# Patient Record
Sex: Female | Born: 1990 | Race: Black or African American | Hispanic: No | Marital: Single | Smoking: Never smoker
Health system: Southern US, Community
[De-identification: ages and names within clinical notes are randomized; demographics above are authoritative.]

## PROBLEM LIST (undated history)

## (undated) ENCOUNTER — Inpatient Hospital Stay (HOSPITAL_COMMUNITY): Payer: Self-pay

## (undated) DIAGNOSIS — B999 Unspecified infectious disease: Secondary | ICD-10-CM

## (undated) DIAGNOSIS — A6000 Herpesviral infection of urogenital system, unspecified: Secondary | ICD-10-CM

## (undated) DIAGNOSIS — O09219 Supervision of pregnancy with history of pre-term labor, unspecified trimester: Secondary | ICD-10-CM

## (undated) DIAGNOSIS — Z789 Other specified health status: Secondary | ICD-10-CM

## (undated) DIAGNOSIS — R519 Headache, unspecified: Secondary | ICD-10-CM

## (undated) DIAGNOSIS — R51 Headache: Secondary | ICD-10-CM

## (undated) DIAGNOSIS — F3181 Bipolar II disorder: Secondary | ICD-10-CM

## (undated) HISTORY — PX: INDUCED ABORTION: SHX677

## (undated) HISTORY — DX: Supervision of pregnancy with history of pre-term labor, unspecified trimester: O09.219

## (undated) HISTORY — PX: THERAPEUTIC ABORTION: SHX798

---
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
## 2012-05-05 ENCOUNTER — Emergency Department (HOSPITAL_COMMUNITY)
Admission: EM | Admit: 2012-05-05 | Discharge: 2012-05-05 | Disposition: A | Discharge status: Home / Self Care | Payer: Self-pay | Attending: Emergency Medicine | Admitting: Emergency Medicine

## 2012-05-05 ENCOUNTER — Encounter (HOSPITAL_COMMUNITY): Payer: Self-pay | Admitting: *Deleted

## 2012-05-05 DIAGNOSIS — T304 Corrosion of unspecified body region, unspecified degree: Secondary | ICD-10-CM

## 2012-05-05 DIAGNOSIS — N898 Other specified noninflammatory disorders of vagina: Secondary | ICD-10-CM | POA: Insufficient documentation

## 2012-05-05 DIAGNOSIS — F17200 Nicotine dependence, unspecified, uncomplicated: Secondary | ICD-10-CM | POA: Insufficient documentation

## 2012-05-05 DIAGNOSIS — Y9389 Activity, other specified: Secondary | ICD-10-CM | POA: Insufficient documentation

## 2012-05-05 DIAGNOSIS — T5491XA Toxic effect of unspecified corrosive substance, accidental (unintentional), initial encounter: Secondary | ICD-10-CM | POA: Insufficient documentation

## 2012-05-05 DIAGNOSIS — Z3202 Encounter for pregnancy test, result negative: Secondary | ICD-10-CM | POA: Insufficient documentation

## 2012-05-05 DIAGNOSIS — Y929 Unspecified place or not applicable: Secondary | ICD-10-CM | POA: Insufficient documentation

## 2012-05-05 LAB — URINE MICROSCOPIC-ADD ON

## 2012-05-05 LAB — URINALYSIS, ROUTINE W REFLEX MICROSCOPIC
Bilirubin Urine: NEGATIVE
Glucose, UA: NEGATIVE mg/dL
Ketones, ur: NEGATIVE mg/dL
Nitrite: NEGATIVE
Specific Gravity, Urine: 1.023 (ref 1.005–1.030)
pH: 5.5 (ref 5.0–8.0)

## 2012-05-05 LAB — WET PREP, GENITAL
Clue Cells Wet Prep HPF POC: NONE SEEN
Trich, Wet Prep: NONE SEEN

## 2012-05-05 NOTE — ED Provider Notes (Signed)
History   This chart was scribed for non-physician practitioner working with Flint Melter, MD by Gerlean Ren, ED Scribe. This patient was seen in room TR06C/TR06C and the patient's care was started at 6:31 PM.     CSN: [id number]  Arrival date & time 05/05/12  1712   First MD Initiated Contact with Patient 05/05/12 1808      Chief Complaint  Patient presents with  . Lip dryness      The history is provided by the patient. No language interpreter was used.   Tara Sanchez is a 22 y.o. female with no chronic medical conditions who presents to the Emergency Department complaining of constant lip dryness since applying bleach to her lips after cleaning several days ago. She states her lips feel sore but not excessively swollen and denies any bleeding. Pt denies any swelling inside her mouth.   Pt further c/o one week of vaginal discharge and requests testing for STDs.  Pt is sexually active but does not use birth control.  LNMP "last month."  Pt denies dysuria, vaginal bleeding, abdominal pain, nausea, emesis, fever.   Pt is a current someday smoker and denies alcohol use. History reviewed. No pertinent past medical history.  History reviewed. No pertinent past surgical history.  No family history on file.  History  Substance Use Topics  . Smoking status: Current Some Day Smoker    Types: Cigarettes  . Smokeless tobacco: Not on file  . Alcohol Use: No    No OB history provided.   Review of Systems  Constitutional: Negative for fever.  HENT:       Dry lips  Gastrointestinal: Negative for nausea, vomiting and abdominal pain.  Genitourinary: Positive for vaginal discharge. Negative for dysuria, vaginal bleeding and vaginal pain.    Allergies  Review of patient's allergies indicates no known allergies.  Home Medications  No current outpatient prescriptions on file.  BP 123/67  Pulse 83  Temp 98.3 F (36.8 C) (Oral)  Resp 18  SpO2 100%  LMP 04/15/2012  Physical  Exam  Nursing note and vitals reviewed. Constitutional: She is oriented to person, place, and time. She appears well-developed and well-nourished. No distress.  HENT:  Head: Normocephalic and atraumatic.       Upper and lower lips red without blisters, burns or bleeding. Minimal swelling.   Eyes: EOM are normal.  Neck: Neck supple. No tracheal deviation present.  Pulmonary/Chest: Effort normal. No respiratory distress.  Abdominal: Soft. There is no tenderness.  Genitourinary:       Cervix and adnexa non-tender, cervix slightly red, friable, mild white cervical discharge.  Musculoskeletal: Normal range of motion.  Neurological: She is alert and oriented to person, place, and time.  Skin: Skin is warm and dry.  Psychiatric: She has a normal mood and affect. Her behavior is normal.    ED Course  Procedures (including critical care time) DIAGNOSTIC STUDIES: Oxygen Saturation is 100% on room air, normal by my interpretation.    COORDINATION OF CARE: 6:37 PM- Pelvic exam performed.  Patient informed of clinical course, understands medical decision-making process, and agrees with plan.     Labs Reviewed  POCT PREGNANCY, URINE  URINALYSIS, ROUTINE W REFLEX MICROSCOPIC   Results for orders placed during the hospital encounter of 05/05/12  URINALYSIS, ROUTINE W REFLEX MICROSCOPIC      Component Value Range   Color, Urine YELLOW  YELLOW   APPearance CLOUDY (*) CLEAR   Specific Gravity, Urine 1.023  1.005 -  1.030   pH 5.5  5.0 - 8.0   Glucose, UA NEGATIVE  NEGATIVE mg/dL   Hgb urine dipstick NEGATIVE  NEGATIVE   Bilirubin Urine NEGATIVE  NEGATIVE   Ketones, ur NEGATIVE  NEGATIVE mg/dL   Protein, ur NEGATIVE  NEGATIVE mg/dL   Urobilinogen, UA 0.2  0.0 - 1.0 mg/dL   Nitrite NEGATIVE  NEGATIVE   Leukocytes, UA TRACE (*) NEGATIVE  POCT PREGNANCY, URINE      Component Value Range   Preg Test, Ur NEGATIVE  NEGATIVE  URINE MICROSCOPIC-ADD ON      Component Value Range   Squamous  Epithelial / LPF MANY (*) RARE   WBC, UA 3-6  <3 WBC/hpf   RBC / HPF 0-2  <3 RBC/hpf   Bacteria, UA MANY (*) RARE  WET PREP, GENITAL      Component Value Range   Yeast Wet Prep HPF POC NONE SEEN  NONE SEEN   Trich, Wet Prep NONE SEEN  NONE SEEN   Clue Cells Wet Prep HPF POC NONE SEEN  NONE SEEN   WBC, Wet Prep HPF POC FEW (*) NONE SEEN    No results found.   No diagnosis found. 1. Chemical burn, lip 2. Vaginal discharge    MDM  Mild chemical irritation to lips without further complication. Vaginal discharge without any cervical or pelvic tenderness - doubt PID, TOA. No dysuria or frequency to cause concern for UTI. Will await cultures (GC, chlamydia, urine).  I personally performed the services described in this documentation, which was scribed in my presence. The recorded information has been reviewed and is accurate.        Arnoldo Hooker, PA-C 05/05/12 1921

## 2012-05-05 NOTE — ED Notes (Signed)
Pt is here with lip dryness and states that she may have touched her lips with carmax and now reporting dry lips.  Pt here to be checked for stds and pregnancy but no symptoms

## 2012-05-05 NOTE — ED Provider Notes (Signed)
Medical screening examination/treatment/procedure(s) were performed by non-physician practitioner and as supervising physician I was immediately available for consultation/collaboration.   Flint Melter, MD 05/05/12 [phone]

## 2012-05-05 NOTE — ED Notes (Signed)
Pt given discharge paperwork; pt had no additional questions about discharge; VSS; e-signature obtained;

## 2012-05-06 LAB — URINE CULTURE: Culture: NO GROWTH

## 2012-05-06 LAB — GC/CHLAMYDIA PROBE AMP: GC Probe RNA: NEGATIVE

## 2012-08-17 ENCOUNTER — Emergency Department (HOSPITAL_COMMUNITY)
Admission: EM | Admit: 2012-08-17 | Discharge: 2012-08-17 | Disposition: A | Discharge status: Home / Self Care | Payer: Medicaid Other | Attending: Emergency Medicine | Admitting: Emergency Medicine

## 2012-08-17 ENCOUNTER — Encounter (HOSPITAL_COMMUNITY): Payer: Self-pay | Admitting: Emergency Medicine

## 2012-08-17 DIAGNOSIS — H5710 Ocular pain, unspecified eye: Secondary | ICD-10-CM | POA: Insufficient documentation

## 2012-08-17 DIAGNOSIS — H5789 Other specified disorders of eye and adnexa: Secondary | ICD-10-CM | POA: Insufficient documentation

## 2012-08-17 DIAGNOSIS — H109 Unspecified conjunctivitis: Secondary | ICD-10-CM

## 2012-08-17 DIAGNOSIS — F17200 Nicotine dependence, unspecified, uncomplicated: Secondary | ICD-10-CM | POA: Insufficient documentation

## 2012-08-17 MED ORDER — TOBRAMYCIN 0.3 % OP SOLN
2.0000 [drp] | Freq: Four times a day (QID) | OPHTHALMIC | Status: DC
  Administered 2012-08-17: 2 [drp] via OPHTHALMIC
  Filled 2012-08-17: qty 5

## 2012-08-17 NOTE — ED Notes (Signed)
PT. REPORTS RED , ITCHY EYES WITH DRAINAGE FOR 4 DAYS , DENIES INJURY , NO BLURRED VISION, TRIED OTC EYE GTTS WITH NO RELIEF.

## 2012-08-17 NOTE — ED Provider Notes (Signed)
Medical screening examination/treatment/procedure(s) were performed by non-physician practitioner and as supervising physician I was immediately available for consultation/collaboration.   Roselee Tayloe, MD 08/17/12 2312 

## 2012-08-17 NOTE — ED Provider Notes (Signed)
History     CSN: [id number]  Arrival date & time 08/17/12  2138   First MD Initiated Contact with Patient 08/17/12 2220      Chief Complaint  Patient presents with  . Conjunctivitis    (Consider location/radiation/quality/duration/timing/severity/associated sxs/prior treatment) Patient is a 22 y.o. female presenting with conjunctivitis. The history is provided by the patient.  Conjunctivitis  The current episode started today. Associated symptoms include eye discharge, eye pain and eye redness. Pertinent negatives include no fever, no photophobia and no rhinorrhea.    History reviewed. No pertinent past medical history.  History reviewed. No pertinent past surgical history.  No family history on file.  History  Substance Use Topics  . Smoking status: Current Some Day Smoker    Types: Cigarettes  . Smokeless tobacco: Not on file  . Alcohol Use: No    OB History   Grav Para Term Preterm Abortions TAB SAB Ect Mult Living                  Review of Systems  Constitutional: Negative for fever.  HENT: Negative for rhinorrhea.   Eyes: Positive for pain, discharge and redness. Negative for photophobia.    Allergies  Review of patient's allergies indicates no known allergies.  Home Medications   Current Outpatient Rx  Name  Route  Sig  Dispense  Refill  . tetrahydrozoline 0.05 % ophthalmic solution   Both Eyes   Place 2 drops into both eyes 3 (three) times daily.           BP 119/71  Pulse 84  Temp(Src) 98.2 F (36.8 C) (Oral)  Resp 16  SpO2 99%  LMP 08/09/2012  Physical Exam  HENT:  Head: Normocephalic.  Eyes: EOM and lids are normal. Pupils are equal, round, and reactive to light. Right conjunctiva is injected. Left conjunctiva is injected.    ED Course  Procedures (including critical care time)  Labs Reviewed - No data to display No results found.   No diagnosis found. 1. Conjunctivitis    MDM  Uncomplicated  conjunctivitis        Arnoldo Hooker, PA-C 08/17/12 2257

## 2012-08-17 NOTE — ED Notes (Signed)
Patient given discharge instructions for conjunctivitis. Advised to follow up with primary care or eye doctor as needed and to continue to use eye drops provided. Patient voiced understanding of all instructions and had no further questions. Patient ambulated to front lobby.

## 2013-08-05 ENCOUNTER — Encounter: Payer: Self-pay | Admitting: Medical

## 2014-03-10 ENCOUNTER — Emergency Department (HOSPITAL_COMMUNITY)
Admission: EM | Admit: 2014-03-10 | Discharge: 2014-03-10 | Disposition: A | Discharge status: Home / Self Care | Payer: Managed Care, Other (non HMO) | Source: Home / Self Care | Attending: Family Medicine | Admitting: Family Medicine

## 2014-03-10 ENCOUNTER — Encounter (HOSPITAL_COMMUNITY): Payer: Self-pay | Admitting: Emergency Medicine

## 2014-03-10 DIAGNOSIS — N390 Urinary tract infection, site not specified: Secondary | ICD-10-CM

## 2014-03-10 LAB — POCT URINALYSIS DIP (DEVICE)
BILIRUBIN URINE: NEGATIVE
GLUCOSE, UA: NEGATIVE mg/dL
KETONES UR: NEGATIVE mg/dL
Nitrite: NEGATIVE
PH: 6 (ref 5.0–8.0)
Protein, ur: NEGATIVE mg/dL
Specific Gravity, Urine: >=1.03 (ref 1.005–1.030)
Urobilinogen, UA: 1 mg/dL (ref 0.0–1.0)

## 2014-03-10 LAB — POCT PREGNANCY, URINE: Preg Test, Ur: NEGATIVE

## 2014-03-10 MED ORDER — PHENAZOPYRIDINE HCL 200 MG PO TABS: 200.0000 mg | ORAL_TABLET | Freq: Three times a day (TID) | ORAL | Status: DC

## 2014-03-10 MED ORDER — CEFUROXIME AXETIL 250 MG PO TABS: 250.0000 mg | ORAL_TABLET | Freq: Two times a day (BID) | ORAL | Status: DC

## 2014-03-10 NOTE — Discharge Instructions (Signed)

## 2014-03-10 NOTE — ED Notes (Signed)
Pt noticed she was having symptoms last night.

## 2014-03-10 NOTE — ED Provider Notes (Signed)
CSN: [id number]     Arrival date & time 03/10/14  1512 History   First MD Initiated Contact with Patient 03/10/14 1531     Chief Complaint  Patient presents with  . Urinary Tract Infection   (Consider location/radiation/quality/duration/timing/severity/associated sxs/prior Treatment) HPI            23 year old female presents for evaluation of a UTI. She has a history of recurrent UTI, she gets one every 3-4 months. She has urinary frequency and urgency, and slight dysuria. She denies abdominal pain, NVD, flank pain. No vaginal discharge. No risk of STDs. No recent travel or sick contacts. No recent hospitalizations or catheterizations.  No past medical history on file. No past surgical history on file. No family history on file. History  Substance Use Topics  . Smoking status: Current Some Day Smoker    Types: Cigarettes  . Smokeless tobacco: Not on file  . Alcohol Use: No   OB History    No data available     Review of Systems  Constitutional: Negative for fever.  Gastrointestinal: Negative for nausea, vomiting and abdominal pain.  Genitourinary: Positive for dysuria, urgency and frequency. Negative for hematuria, flank pain and vaginal discharge.  All other systems reviewed and are negative.   Allergies  Review of patient's allergies indicates no known allergies.  Home Medications   Prior to Admission medications   Medication Sig Start Date End Date Taking? Authorizing Provider  cefUROXime (CEFTIN) 250 MG tablet Take 1 tablet (250 mg total) by mouth 2 (two) times daily with a meal. 03/10/14   Graylon GoodZachary H Adi Seales, PA-C  phenazopyridine (PYRIDIUM) 200 MG tablet Take 1 tablet (200 mg total) by mouth 3 (three) times daily. 03/10/14   Graylon GoodZachary H Genesys Coggeshall, PA-C  tetrahydrozoline 0.05 % ophthalmic solution Place 2 drops into both eyes 3 (three) times daily.    Historical Provider, MD   BP 126/67 mmHg  Pulse 86  Temp(Src) 98.2 F (36.8 C) (Oral)  Resp 18  SpO2 96% Physical Exam   Constitutional: She is oriented to person, place, and time. Vital signs are normal. She appears well-developed and well-nourished. No distress.  HENT:  Head: Normocephalic and atraumatic.  Pulmonary/Chest: Effort normal. No respiratory distress.  Abdominal: Soft. Normal appearance. She exhibits no distension and no mass. There is no tenderness. There is no rebound, no guarding and no CVA tenderness.  Neurological: She is alert and oriented to person, place, and time. She has normal strength. Coordination normal.  Skin: Skin is warm and dry. No rash noted. She is not diaphoretic.  Psychiatric: She has a normal mood and affect. Judgment normal.  Nursing note and vitals reviewed.   ED Course  Procedures (including critical care time) Labs Review Labs Reviewed  POCT URINALYSIS DIP (DEVICE) - Abnormal; Notable for the following:    Hgb urine dipstick TRACE (*)    Leukocytes, UA SMALL (*)    All other components within normal limits  URINE CULTURE  POCT PREGNANCY, URINE    Imaging Review No results found.   MDM   1. UTI (lower urinary tract infection)    Simple UTI, treat with Ceftin. Urine culture sent. Follow-up when necessary  Meds ordered this encounter  Medications  . cefUROXime (CEFTIN) 250 MG tablet    Sig: Take 1 tablet (250 mg total) by mouth 2 (two) times daily with a meal.    Dispense:  10 tablet    Refill:  0    Order Specific Question:  Supervising Provider  Answer:  Linna HoffKINDL, JAMES D [[id number]]  . phenazopyridine (PYRIDIUM) 200 MG tablet    Sig: Take 1 tablet (200 mg total) by mouth 3 (three) times daily.    Dispense:  6 tablet    Refill:  0    Order Specific Question:  Supervising Provider    Answer:  Bradd CanaryKINDL, JAMES D [[id number]]      Graylon GoodZachary H Rayden Dock, PA-C 03/10/14 [phone]

## 2014-03-12 LAB — URINE CULTURE: Colony Count: >=100000

## 2014-03-12 NOTE — ED Notes (Signed)
Urine culture: >100,000 colonies Staph species (coagulase neg.).  Pt adequately treated with Ceftin. [name] M 03/12/2014  

## 2014-03-27 ENCOUNTER — Emergency Department (HOSPITAL_COMMUNITY)
Admission: EM | Admit: 2014-03-27 | Discharge: 2014-03-27 | Disposition: A | Discharge status: Home / Self Care | Payer: Managed Care, Other (non HMO) | Attending: Emergency Medicine | Admitting: Emergency Medicine

## 2014-03-27 ENCOUNTER — Encounter (HOSPITAL_COMMUNITY): Payer: Self-pay | Admitting: *Deleted

## 2014-03-27 DIAGNOSIS — Z87440 Personal history of urinary (tract) infections: Secondary | ICD-10-CM | POA: Insufficient documentation

## 2014-03-27 DIAGNOSIS — N3090 Cystitis, unspecified without hematuria: Secondary | ICD-10-CM | POA: Insufficient documentation

## 2014-03-27 DIAGNOSIS — Z3202 Encounter for pregnancy test, result negative: Secondary | ICD-10-CM | POA: Insufficient documentation

## 2014-03-27 DIAGNOSIS — N939 Abnormal uterine and vaginal bleeding, unspecified: Secondary | ICD-10-CM | POA: Insufficient documentation

## 2014-03-27 DIAGNOSIS — R110 Nausea: Secondary | ICD-10-CM | POA: Insufficient documentation

## 2014-03-27 DIAGNOSIS — Z79899 Other long term (current) drug therapy: Secondary | ICD-10-CM | POA: Insufficient documentation

## 2014-03-27 DIAGNOSIS — Z792 Long term (current) use of antibiotics: Secondary | ICD-10-CM | POA: Insufficient documentation

## 2014-03-27 DIAGNOSIS — Z720 Tobacco use: Secondary | ICD-10-CM | POA: Insufficient documentation

## 2014-03-27 LAB — WET PREP, GENITAL
Clue Cells Wet Prep HPF POC: NONE SEEN
Trich, Wet Prep: NONE SEEN
Yeast Wet Prep HPF POC: NONE SEEN

## 2014-03-27 LAB — URINALYSIS, ROUTINE W REFLEX MICROSCOPIC
BILIRUBIN URINE: NEGATIVE
GLUCOSE, UA: NEGATIVE mg/dL
Ketones, ur: NEGATIVE mg/dL
Nitrite: NEGATIVE
PH: 6 (ref 5.0–8.0)
Protein, ur: 100 mg/dL — AB
SPECIFIC GRAVITY, URINE: 1.023 (ref 1.005–1.030)
Urobilinogen, UA: 1 mg/dL (ref 0.0–1.0)

## 2014-03-27 LAB — POC URINE PREG, ED: Preg Test, Ur: NEGATIVE

## 2014-03-27 LAB — URINE MICROSCOPIC-ADD ON

## 2014-03-27 MED ORDER — NITROFURANTOIN MONOHYD MACRO 100 MG PO CAPS
100.0000 mg | ORAL_CAPSULE | Freq: Once | ORAL | Status: AC
  Administered 2014-03-27: 100 mg via ORAL
  Filled 2014-03-27: qty 1

## 2014-03-27 MED ORDER — NITROFURANTOIN MONOHYD MACRO 100 MG PO CAPS: 100.0000 mg | ORAL_CAPSULE | Freq: Two times a day (BID) | ORAL | Status: DC

## 2014-03-27 NOTE — ED Notes (Signed)
The pt is sleeping 

## 2014-03-27 NOTE — ED Provider Notes (Signed)
CSN: [id number]     Arrival date & time 03/27/14  0007 History  This chart was scribed for Mirian MoMatthew Gentry, MD by Evon Slackerrance Branch, ED Scribe. This patient was seen in room D33C/D33C and the patient's care was started at 1:07 AM.    Chief Complaint  Patient presents with  . Abdominal Pain   Patient is a 23 y.o. female presenting with abdominal pain. The history is provided by the patient. No language interpreter was used.  Abdominal Pain Pain location:  Suprapubic Pain quality: sharp   Pain radiates to:  Does not radiate Pain severity:  Mild Onset quality:  Gradual Timing:  Intermittent Progression:  Worsening Relieved by:  Urination Worsened by:  Nothing tried Associated symptoms: nausea and vaginal bleeding   Associated symptoms: no chest pain, no cough, no dysuria, no fever, no shortness of breath, no vaginal discharge and no vomiting    HPI Comments: Tara Sanchez is a 23 y.o. female who presents to the Emergency Department complaining of intermittent  sharp lower abdominal pain onset 3 days ago.. She states that the pain has worsened and bacame constant in the past 2 hours. She states she has had some associated vaginal bleeding and nausea. She states that urinating sometimes provides some relief. Pt states she has a hx of UTI's. She states that he LNMP was 1 week ago. Denies dysuria, vaginal discharge, fever, vomiting, diarrhea, chest pain or SOB.    History reviewed. No pertinent past medical history. History reviewed. No pertinent past surgical history. History reviewed. No pertinent family history. History  Substance Use Topics  . Smoking status: Current Some Day Smoker    Types: Cigarettes  . Smokeless tobacco: Not on file  . Alcohol Use: No   OB History    No data available     Review of Systems  Constitutional: Negative for fever.  Respiratory: Negative for cough and shortness of breath.   Cardiovascular: Negative for chest pain.  Gastrointestinal: Positive for  nausea and abdominal pain. Negative for vomiting.  Genitourinary: Positive for vaginal bleeding. Negative for dysuria and vaginal discharge.  All other systems reviewed and are negative.   Allergies  Review of patient's allergies indicates no known allergies.  Home Medications   Prior to Admission medications   Medication Sig Start Date End Date Taking? Authorizing Provider  cefUROXime (CEFTIN) 250 MG tablet Take 1 tablet (250 mg total) by mouth 2 (two) times daily with a meal. Patient not taking: Reported on 03/27/2014 03/10/14   Graylon GoodZachary H Baker, PA-C  nitrofurantoin, macrocrystal-monohydrate, (MACROBID) 100 MG capsule Take 1 capsule (100 mg total) by mouth 2 (two) times daily. X 7 days 03/27/14   Mirian MoMatthew Gentry, MD  phenazopyridine (PYRIDIUM) 200 MG tablet Take 1 tablet (200 mg total) by mouth 3 (three) times daily. Patient not taking: Reported on 03/27/2014 03/10/14   Graylon GoodZachary H Baker, PA-C   Triage Vitals: BP 118/57 mmHg  Pulse 86  Temp(Src) 98.5 F (36.9 C) (Oral)  Resp 20  SpO2 100%  LMP 03/19/2014  Physical Exam  Constitutional: She is oriented to person, place, and time. She appears well-developed and well-nourished.  HENT:  Head: Normocephalic and atraumatic.  Right Ear: External ear normal.  Left Ear: External ear normal.  Eyes: Conjunctivae and EOM are normal. Pupils are equal, round, and reactive to light.  Neck: Normal range of motion. Neck supple.  Cardiovascular: Normal rate, regular rhythm, normal heart sounds and intact distal pulses.   Pulmonary/Chest: Effort normal and breath sounds  normal.  Abdominal: Soft. Bowel sounds are normal. There is tenderness in the suprapubic area.  Genitourinary: Cervix exhibits no motion tenderness, no discharge and no friability. Right adnexum displays no tenderness. Left adnexum displays no tenderness.  Musculoskeletal: Normal range of motion.  Neurological: She is alert and oriented to person, place, and time.  Skin: Skin is warm  and dry.  Vitals reviewed.   ED Course  Procedures (including critical care time) Labs Review Labs Reviewed  WET PREP, GENITAL - Abnormal; Notable for the following:    WBC, Wet Prep HPF POC FEW (*)    All other components within normal limits  URINALYSIS, ROUTINE W REFLEX MICROSCOPIC - Abnormal; Notable for the following:    APPearance TURBID (*)    Hgb urine dipstick MODERATE (*)    Protein, ur 100 (*)    Leukocytes, UA LARGE (*)    All other components within normal limits  GC/CHLAMYDIA PROBE AMP  URINE CULTURE  URINE MICROSCOPIC-ADD ON  POC URINE PREG, ED    Imaging Review No results found.   EKG Interpretation None      MDM   Final diagnoses:  Cystitis       23 y.o. female with pertinent PMH of recurrent UTI presents with recurrent UTI symptoms, with exception of dysuria.  Patient states that she typically has pain in same location with prior urinary tract infections.  She denies fever, flank pain, other signs of pyelonephritis. No vaginal discharge or bleeding. On arrival today vitals signs and physical exam as above. No signs of STI on my examination. UA with signs of infection. Treated with Macrobid. We'll have her discharged home and follow-up with PCP.    1. Cystitis           Mirian MoMatthew Gentry, MD 03/27/14 [phone]

## 2014-03-27 NOTE — ED Notes (Addendum)
Pt st's she has had lower abd pain for a few days but became worse tonight.  Describes pain as sharp.  Nausea without vomiting.  Pt denies any vag. Discharge.  Pt talking on cell phone entire assessment being made.

## 2014-03-27 NOTE — Discharge Instructions (Signed)

## 2014-03-27 NOTE — ED Notes (Signed)
Pt in c/o suprapubic abd pain for the last few days, denies other symptoms, no distress noted

## 2014-03-28 LAB — URINE CULTURE

## 2014-03-28 LAB — GC/CHLAMYDIA PROBE AMP
CT Probe RNA: NEGATIVE
GC Probe RNA: NEGATIVE

## 2014-06-21 ENCOUNTER — Ambulatory Visit (INDEPENDENT_AMBULATORY_CARE_PROVIDER_SITE_OTHER): Payer: BLUE CROSS/BLUE SHIELD | Admitting: Physician Assistant

## 2014-06-21 VITALS — BP 110/65 | HR 80 | Temp 98.9°F | Resp 16 | Ht 68.0 in | Wt 171.0 lb

## 2014-06-21 DIAGNOSIS — L298 Other pruritus: Secondary | ICD-10-CM

## 2014-06-21 DIAGNOSIS — N898 Other specified noninflammatory disorders of vagina: Secondary | ICD-10-CM | POA: Diagnosis not present

## 2014-06-21 DIAGNOSIS — A5901 Trichomonal vulvovaginitis: Secondary | ICD-10-CM

## 2014-06-21 DIAGNOSIS — R350 Frequency of micturition: Secondary | ICD-10-CM | POA: Diagnosis not present

## 2014-06-21 DIAGNOSIS — A5909 Other urogenital trichomoniasis: Secondary | ICD-10-CM

## 2014-06-21 DIAGNOSIS — A5903 Trichomonal cystitis and urethritis: Secondary | ICD-10-CM

## 2014-06-21 LAB — POCT UA - MICROSCOPIC ONLY
Casts, Ur, LPF, POC: NEGATIVE
Crystals, Ur, HPF, POC: NEGATIVE
Mucus, UA: NEGATIVE
Trichomonas, UA: POSITIVE
Yeast, UA: NEGATIVE

## 2014-06-21 LAB — POCT URINALYSIS DIPSTICK
Bilirubin, UA: NEGATIVE
Glucose, UA: NEGATIVE
Nitrite, UA: NEGATIVE
Protein, UA: NEGATIVE
RBC UA: NEGATIVE
SPEC GRAV UA: 1.02
UROBILINOGEN UA: 1
pH, UA: 7

## 2014-06-21 LAB — POCT WET PREP WITH KOH
KOH Prep POC: NEGATIVE
TRICHOMONAS UA: POSITIVE
YEAST WET PREP PER HPF POC: NEGATIVE

## 2014-06-21 MED ORDER — METRONIDAZOLE 500 MG PO TABS: 500.0000 mg | ORAL_TABLET | Freq: Two times a day (BID) | ORAL | Status: AC

## 2014-06-21 NOTE — Progress Notes (Signed)
[facility] [address], [place] [address] [phone]  Date:  06/21/2014   Name:  Tara Sanchez   DOB:  1990-04-29   MRN:  [id number]  PCP:  No PCP Per Patient    Chief Complaint: Vaginal Discharge and Urinary Frequency   History of Present Illness:  Tara Sanchez is a 24 y.o. very pleasant female patient who presents with the following: 3 days of a yellowish discharge that appears to be more in volume than her normal.  She has irritation in the groin and also noticed an odor.  She states there is no new onset of rash.  She states that she has also noticed an increase in frequency of urination, though there is no dysuria, hematuria, abdominal pain, fever, nausea, or vomiting.   Patient claims 2 sexual partners, with one new partner within a week of becoming symptomatic.  This was an unprotected sexual partner (which initially stated were protected sexual partners until the labs were positive).     There are no active problems to display for this patient.   No past medical history on file.  No past surgical history on file.  History  Substance Use Topics  . Smoking status: Current Some Day Smoker    Types: Cigarettes  . Smokeless tobacco: Not on file  . Alcohol Use: No    No family history on file.  No Known Allergies  Medication list has been reviewed and updated.  No current outpatient prescriptions on file prior to visit.   No current facility-administered medications on file prior to visit.    Review of Systems: ROS otherwise unremarkable unless listed above.  Physical Examination: Filed Vitals:   06/21/14 1659  BP: 110/65  Pulse: 80  Temp: 98.9 F (37.2 C)  Resp: 16   Filed Vitals:   06/21/14 1659  Height: 5\' 8"  (1.727 m)  Weight: 171 lb (77.565 kg)   Body mass index is 26.01 kg/(m^2). Ideal Body Weight: Weight in (lb) to have BMI = 25: 164.1 Physical Exam  Constitutional: She appears well-developed and well-nourished.   Eyes: Pupils are equal, round, and reactive to light.  Cardiovascular: Normal rate.   Pulmonary/Chest: Effort normal.  Genitourinary: Pelvic exam was performed with patient supine. There is rash (very few comedones along the pubic area and outer labia majora.  ) on the right labia. There is no tenderness on the right labia. There is no tenderness on the left labia. Cervix exhibits no motion tenderness, no discharge and no friability. No erythema or tenderness in the vagina.  Heavy ample yellowish grey discharge with musty odor.    Psychiatric: She has a normal mood and affect. Her behavior is normal.   At the start of visit, patient present her pants to me asking if this discharge is normal, with heavy yellow mucus all along seat of pants.  Her affect is mildly histrionic, yet very pleasant.  Speech is normal without pressure and speed.    Results for orders placed or performed in visit on 06/21/14  POCT UA - Microscopic Only  Result Value Ref Range   WBC, Ur, HPF, POC 25-35    RBC, urine, microscopic 2-4    Bacteria, U Microscopic 1+    Mucus, UA neg    Epithelial cells, urine per micros 3-5    Crystals, Ur, HPF, POC neg    Casts, Ur, LPF, POC neg    Yeast, UA neg    Trichomonas, UA Positive  POCT urinalysis dipstick  Result Value Ref Range   Color, UA yellow    Clarity, UA cloudy    Glucose, UA neg    Bilirubin, UA neg    Ketones, UA trace    Spec Grav, UA 1.020    Blood, UA neg    pH, UA 7.0    Protein, UA neg    Urobilinogen, UA 1.0    Nitrite, UA neg    Leukocytes, UA large (3+)   POCT Wet Prep with KOH  Result Value Ref Range   Trichomonas, UA Positive    Clue Cells Wet Prep HPF POC 1-3    Epithelial Wet Prep HPF POC 2-4    Yeast Wet Prep HPF POC neg    Bacteria Wet Prep HPF POC 1+    RBC Wet Prep HPF POC 1-4    WBC Wet Prep HPF POC 35-40    KOH Prep POC Negative       Assessment and Plan: 24 year old female is here today for chief complaint of urinary  frequency and vaginal discharge and itching.  Spent ample time discussing safer sexual practice and condom use.   Treating with metronidazole BID for 7 days. Advised to let sexual partners know.    Urinary frequency - Plan: POCT UA - Microscopic Only, POCT urinalysis dipstick, Urine culture  Vaginal itching - Plan: POCT UA - Microscopic Only, POCT urinalysis dipstick, POCT Wet Prep with KOH, GC/Chlamydia Probe Amp, Urine culture  Vaginal discharge - Plan: GC/Chlamydia Probe Amp, Urine culture  Trichomoniasis of bladder - Plan: metroNIDAZOLE (FLAGYL) 500 MG tablet  Trichomoniasis of vagina - Plan: metroNIDAZOLE (FLAGYL) 500 MG tablet  Trena Platt, PA-C [facility] 3/2/20165:50 PM   .

## 2014-06-21 NOTE — Patient Instructions (Addendum)
Trichomoniasis °Trichomoniasis is an infection caused by an organism called Trichomonas. The infection can affect both women and men. In women, the outer female genitalia and the vagina are affected. In men, the penis is mainly affected, but the prostate and other reproductive organs can also be involved. Trichomoniasis is a sexually transmitted infection (STI) and is most often passed to another person through sexual contact.  °RISK FACTORS °· Having unprotected sexual intercourse. °· Having sexual intercourse with an infected partner. °SIGNS AND SYMPTOMS  °Symptoms of trichomoniasis in women include: °· Abnormal gray-green frothy vaginal discharge. °· Itching and irritation of the vagina. °· Itching and irritation of the area outside the vagina. °Symptoms of trichomoniasis in men include:  °· Penile discharge with or without pain. °· Pain during urination. This results from inflammation of the urethra. °DIAGNOSIS  °Trichomoniasis may be found during a Pap test or physical exam. Your health care provider may use one of the following methods to help diagnose this infection: °· Examining vaginal discharge under a microscope. For men, urethral discharge would be examined. °· Testing the pH of the vagina with a test tape. °· Using a vaginal swab test that checks for the Trichomonas organism. A test is available that provides results within a few minutes. °· Doing a culture test for the organism. This is not usually needed. °TREATMENT  °· You may be given medicine to fight the infection. Women should inform their health care provider if they could be or are pregnant. Some medicines used to treat the infection should not be taken during pregnancy. °· Your health care provider may recommend over-the-counter medicines or creams to decrease itching or irritation. °· Your sexual partner will need to be treated if infected. °HOME CARE INSTRUCTIONS  °· Take medicines only as directed by your health care provider. °· Take  over-the-counter medicine for itching or irritation as directed by your health care provider. °· Do not have sexual intercourse while you have the infection. °· Women should not douche or wear tampons while they have the infection. °· Discuss your infection with your partner. Your partner may have gotten the infection from you, or you may have gotten it from your partner. °· Have your sex partner get examined and treated if necessary. °· Practice safe, informed, and protected sex. °· See your health care provider for other STI testing. °SEEK MEDICAL CARE IF:  °· You still have symptoms after you finish your medicine. °· You develop abdominal pain. °· You have pain when you urinate. °· You have bleeding after sexual intercourse. °· You develop a rash. °· Your medicine makes you sick or makes you throw up (vomit). °MAKE SURE YOU: °· Understand these instructions. °· Will watch your condition. °· Will get help right away if you are not doing well or get worse. °Document Released: 10/01/2000 Document Revised: 08/22/2013 Document Reviewed: 01/17/2013 °ExitCare® Patient Information ©2015 [employer]. This information is not intended to replace advice given to you by your health care provider. Make sure you discuss any questions you have with your health care provider. ° °Sexually Transmitted Disease °A sexually transmitted disease (STD) is a disease or infection that may be passed (transmitted) from person to person, usually during sexual activity. This may happen by way of saliva, semen, blood, vaginal mucus, or urine. Common STDs include:  °· Gonorrhea.   °· Chlamydia.   °· Syphilis.   °· HIV and AIDS.   °· Genital herpes.   °· Hepatitis B and C.   °· Trichomonas.   °· Human papillomavirus (  HPV).   °· Pubic lice.   °· Scabies. °· Mites. °· Bacterial vaginosis. °WHAT ARE CAUSES OF STDs? °An STD may be caused by bacteria, a virus, or parasites. STDs are often transmitted during sexual activity if one person is infected.  However, they may also be transmitted through nonsexual means. STDs may be transmitted after:  °· Sexual intercourse with an infected person.   °· Sharing sex toys with an infected person.   °· Sharing needles with an infected person or using unclean piercing or tattoo needles. °· Having intimate contact with the genitals, mouth, or rectal areas of an infected person.   °· Exposure to infected fluids during birth. °WHAT ARE THE SIGNS AND SYMPTOMS OF STDs? °Different STDs have different symptoms. Some people may not have any symptoms. If symptoms are present, they may include:  °· Painful or bloody urination.   °· Pain in the pelvis, abdomen, vagina, anus, throat, or eyes.   °· A skin rash, itching, or irritation. °· Growths, ulcerations, blisters, or sores in the genital and anal areas. °· Abnormal vaginal discharge with or without bad odor.   °· Penile discharge in men.   °· Fever.   °· Pain or bleeding during sexual intercourse.   °· Swollen glands in the groin area.   °· Yellow skin and eyes (jaundice). This is seen with hepatitis.   °· Swollen testicles. °· Infertility. °· Sores and blisters in the mouth. °HOW ARE STDs DIAGNOSED? °To make a diagnosis, your health care provider may:  °· Take a medical history.   °· Perform a physical exam.   °· Take a sample of any discharge to examine. °· Swab the throat, cervix, opening to the penis, rectum, or vagina for testing. °· Test a sample of your first morning urine.   °· Perform blood tests.   °· Perform a Pap test, if this applies.   °· Perform a colposcopy.   °· Perform a laparoscopy.   °HOW ARE STDs TREATED? ° Treatment depends on the STD. Some STDs may be treated but not cured.  °· Chlamydia, gonorrhea, trichomonas, and syphilis can be cured with antibiotic medicine.   °· Genital herpes, hepatitis, and HIV can be treated, but not cured, with prescribed medicines. The medicines lessen symptoms.   °· Genital warts from HPV can be treated with medicine or by  freezing, burning (electrocautery), or surgery. Warts may come back.   °· HPV cannot be cured with medicine or surgery. However, abnormal areas may be removed from the cervix, vagina, or vulva.   °· If your diagnosis is confirmed, your recent sexual partners need treatment. This is true even if they are symptom-free or have a negative culture or evaluation. They should not have sex until their health care providers say it is okay. °HOW CAN I REDUCE MY RISK OF GETTING AN STD? °Take these steps to reduce your risk of getting an STD: °· Use latex condoms, dental dams, and water-soluble lubricants during sexual activity. Do not use petroleum jelly or oils. °· Avoid having multiple sex partners. °· Do not have sex with someone who has other sex partners. °· Do not have sex with anyone you do not know or who is at high risk for an STD. °· Avoid risky sex practices that can break your skin. °· Do not have sex if you have open sores on your mouth or skin. °· Avoid drinking too much alcohol or taking illegal drugs. Alcohol and drugs can affect your judgment and put you in a vulnerable position. °· Avoid engaging in oral and anal sex acts. °· Get vaccinated for HPV and hepatitis. If you   have not received these vaccines in the past, talk to your health care provider about whether one or both might be right for you.   °· If you are at risk of being infected with HIV, it is recommended that you take a prescription medicine daily to prevent HIV infection. This is called pre-exposure prophylaxis (PrEP). You are considered at risk if: °¨ You are a man who has sex with other men (MSM). °¨ You are a heterosexual man or woman and are sexually active with more than one partner. °¨ You take drugs by injection. °¨ You are sexually active with a partner who has HIV. °· Talk with your health care provider about whether you are at high risk of being infected with HIV. If you choose to begin PrEP, you should first be tested for HIV. You  should then be tested every 3 months for as long as you are taking PrEP.   °WHAT SHOULD I DO IF I THINK I HAVE AN STD? °· See your health care provider.   °· Tell your sexual partner(s). They should be tested and treated for any STDs. °· Do not have sex until your health care provider says it is okay.  °WHEN SHOULD I GET IMMEDIATE MEDICAL CARE? °Contact your health care provider right away if:  °· You have severe abdominal pain. °· You are a man and notice swelling or pain in your testicles. °· You are a woman and notice swelling or pain in your vagina. °Document Released: 06/28/2002 Document Revised: 04/12/2013 Document Reviewed: 10/26/2012 °ExitCare® Patient Information ©2015 [employer]. This information is not intended to replace advice given to you by your health care provider. Make sure you discuss any questions you have with your health care provider. ° °

## 2014-06-22 LAB — GC/CHLAMYDIA PROBE AMP
CT PROBE, AMP APTIMA: NEGATIVE
GC Probe RNA: NEGATIVE

## 2014-06-23 ENCOUNTER — Telehealth: Payer: Self-pay | Admitting: Physician Assistant

## 2014-06-23 LAB — URINE CULTURE: Colony Count: 35000

## 2014-06-23 NOTE — Telephone Encounter (Signed)
Spoke with patient of lab results of gonorrhea and chlamydia.  She states that is taking the metronidazole and compliant.  She states that she would like to have syphilis and HIV test.  She states that she would like to come in for a visit for these labs though we could just do a labs only.   Therefore I will await her visit.  Will also consider rechecking for trichomoniasis, as this is her 2nd bout of disease.

## 2014-07-11 ENCOUNTER — Encounter: Payer: Self-pay | Admitting: Family Medicine

## 2014-07-19 ENCOUNTER — Ambulatory Visit (INDEPENDENT_AMBULATORY_CARE_PROVIDER_SITE_OTHER): Payer: BLUE CROSS/BLUE SHIELD | Admitting: Physician Assistant

## 2014-07-19 VITALS — BP 120/70 | HR 76 | Temp 97.8°F | Resp 17 | Ht 67.75 in | Wt 168.0 lb

## 2014-07-19 DIAGNOSIS — Z309 Encounter for contraceptive management, unspecified: Secondary | ICD-10-CM

## 2014-07-19 DIAGNOSIS — L298 Other pruritus: Secondary | ICD-10-CM

## 2014-07-19 DIAGNOSIS — B379 Candidiasis, unspecified: Secondary | ICD-10-CM

## 2014-07-19 DIAGNOSIS — N898 Other specified noninflammatory disorders of vagina: Secondary | ICD-10-CM

## 2014-07-19 DIAGNOSIS — R300 Dysuria: Secondary | ICD-10-CM

## 2014-07-19 DIAGNOSIS — Z3009 Encounter for other general counseling and advice on contraception: Secondary | ICD-10-CM

## 2014-07-19 LAB — POCT UA - MICROSCOPIC ONLY
CASTS, UR, LPF, POC: NEGATIVE
Crystals, Ur, HPF, POC: NEGATIVE
Mucus, UA: NEGATIVE
Yeast, UA: NEGATIVE

## 2014-07-19 LAB — POCT WET PREP WITH KOH
Clue Cells Wet Prep HPF POC: 100
KOH Prep POC: POSITIVE
TRICHOMONAS UA: NEGATIVE
YEAST WET PREP PER HPF POC: POSITIVE

## 2014-07-19 LAB — POCT URINALYSIS DIPSTICK
Bilirubin, UA: NEGATIVE
Blood, UA: NEGATIVE
GLUCOSE UA: NEGATIVE
Leukocytes, UA: NEGATIVE
Nitrite, UA: NEGATIVE
Protein, UA: 30
SPEC GRAV UA: 1.02
Urobilinogen, UA: 1
pH, UA: 6

## 2014-07-19 MED ORDER — FLUCONAZOLE 150 MG PO TABS: 150.0000 mg | ORAL_TABLET | Freq: Once | ORAL | Status: DC

## 2014-07-19 MED ORDER — METRONIDAZOLE 500 MG PO TABS: 500.0000 mg | ORAL_TABLET | Freq: Two times a day (BID) | ORAL | Status: AC

## 2014-07-19 MED ORDER — NORGESTIM-ETH ESTRAD TRIPHASIC 0.18/0.215/0.25 MG-35 MCG PO TABS: 1.0000 | ORAL_TABLET | Freq: Every day | ORAL | Status: DC

## 2014-07-19 NOTE — Progress Notes (Signed)
[facility] [address], [place] [id number] [phone]  Date:  07/19/2014   Name:  Tara Illmber J Jobson   DOB:  1991/04/15   MRN:  [id number]  PCP:  No PCP Per Patient    Chief Complaint: Vaginal Itching   History of Present Illness:  Tara Sanchez is a 24 y.o. very pleasant female patient who presents with the following:  Patient has had 2 weeks of burning sensation with urination, vaginal itching, and abnormal vaginal discharge.  She describes the vaginal discharge like cottage cheese.  She also states that she has clitoral pruritus.  She uses baby powder, corn starch, and lotion which provided relief.  She denies hematuria, nausea, vomiting, back pain, or fever.   She also desires to be placed on an ocp.  She would like the Nexplanon, however financially, is unable to afford at this time.  Patient believes that she would be able to take medication at the same time every day.  Patient has had one pregnancy with 22 year old daughter.    There are no active problems to display for this patient.   History reviewed. No pertinent past medical history.  History reviewed. No pertinent past surgical history.  History  Substance Use Topics  . Smoking status: Current Some Day Smoker    Types: Cigarettes  . Smokeless tobacco: Not on file  . Alcohol Use: No    History reviewed. No pertinent family history.  No Known Allergies  Medication list has been reviewed and updated.  No current outpatient prescriptions on file prior to visit.   No current facility-administered medications on file prior to visit.    Review of Systems: ROS otherwise unremarkable unless listed above.    Physical Examination: Filed Vitals:   07/19/14 1019  BP: 120/70  Pulse: 76  Temp: 97.8 F (36.6 C)  Resp: 17   Filed Vitals:   07/19/14 1019  Height: 5' 7.75" (1.721 m)  Weight: 168 lb (76.204 kg)   Body mass index is 25.73 kg/(m^2). Ideal Body Weight: Weight in (lb) to have  BMI = 25: 162.9  Physical Exam  Constitutional: She is oriented to person, place, and time. She appears well-developed and well-nourished. No distress.  Eyes: EOM are normal. Pupils are equal, round, and reactive to light.  Cardiovascular: Normal rate, regular rhythm and normal heart sounds.  Exam reveals no friction rub.   No murmur heard. Pulmonary/Chest: Effort normal and breath sounds normal. No respiratory distress.  Neurological: She is alert and oriented to person, place, and time.  Skin: Skin is warm and dry. No erythema. No pallor.  Psychiatric: She has a normal mood and affect. Her behavior is normal.   Results for orders placed or performed in visit on 07/19/14  POCT UA - Microscopic Only  Result Value Ref Range   WBC, Ur, HPF, POC 10-15    RBC, urine, microscopic 0-1    Bacteria, U Microscopic 2+    Mucus, UA neg    Epithelial cells, urine per micros 2-6    Crystals, Ur, HPF, POC neg    Casts, Ur, LPF, POC neg    Yeast, UA neg   POCT urinalysis dipstick  Result Value Ref Range   Color, UA yellow    Clarity, UA clear    Glucose, UA neg    Bilirubin, UA neg    Ketones, UA trace    Spec Grav, UA 1.020    Blood, UA neg  pH, UA 6.0    Protein, UA 30    Urobilinogen, UA 1.0    Nitrite, UA neg    Leukocytes, UA Negative   POCT Wet Prep with KOH  Result Value Ref Range   Trichomonas, UA Negative    Clue Cells Wet Prep HPF POC 100%    Epithelial Wet Prep HPF POC 15-20    Yeast Wet Prep HPF POC positive    Bacteria Wet Prep HPF POC 4+    RBC Wet Prep HPF POC 1-2    WBC Wet Prep HPF POC tntc    KOH Prep POC Positive       Assessment and Plan 24 year old female is here today for vaginal itching, abnormal vaginal discharge, and desire for birth control medications.  Patient would prefer the Nexplanon, however at this time, would like OCP in the meantime until able to afford.  Discussed protective barriers while taking contraception.    Dysuria - Plan: POCT UA -  Microscopic Only, POCT urinalysis dipstick, POCT Wet Prep with KOH  Vaginal itching - Plan: POCT Wet Prep with KOH, metronidazole (FLAGYL) 500 MG tablet  Yeast infection - Plan: fluconazole (DIFLUCAN) 150 MG tablet  Birth control counseling - Plan: Norgestimate-Ethinyl Estradiol Triphasic 0.18/0.215/0.25 MG-35 MCG tablet  Trena Platt, PA-C [facility] 3/31/20164:13 PM

## 2014-07-19 NOTE — Patient Instructions (Addendum)
Candida Infection A Candida infection (also called yeast, fungus, and Monilia infection) is an overgrowth of yeast that can occur anywhere on the body. A yeast infection commonly occurs in warm, moist body areas. Usually, the infection remains localized but can spread to become a systemic infection. A yeast infection may be a sign of a more severe disease such as diabetes, leukemia, or AIDS. A yeast infection can occur in both men and women. In women, Candida vaginitis is a vaginal infection. It is one of the most common causes of vaginitis. Men usually do not have symptoms or know they have an infection until other problems develop. Men may find out they have a yeast infection because their sex partner has a yeast infection. Uncircumcised men are more likely to get a yeast infection than circumcised men. This is because the uncircumcised glans is not exposed to air and does not remain as dry as that of a circumcised glans. Older adults may develop yeast infections around dentures. CAUSES  Women  Antibiotics.  Steroid medication taken for a long time.  Being overweight (obese).  Diabetes.  Poor immune condition.  Certain serious medical conditions.  Immune suppressive medications for organ transplant patients.  Chemotherapy.  Pregnancy.  Menstruation.  Stress and fatigue.  Intravenous drug use.  Oral contraceptives.  Wearing tight-fitting clothes in the crotch area.  Catching it from a sex partner who has a yeast infection.  Spermicide.  Intravenous, urinary, or other catheters. Men  Catching it from a sex partner who has a yeast infection.  Having oral or anal sex with a person who has the infection.  Spermicide.  Diabetes.  Antibiotics.  Poor immune system.  Medications that suppress the immune system.  Intravenous drug use.  Intravenous, urinary, or other catheters. SYMPTOMS  Women  Thick, white vaginal discharge.  Vaginal itching.  Redness and  swelling in and around the vagina.  Irritation of the lips of the vagina and perineum.  Blisters on the vaginal lips and perineum.  Painful sexual intercourse.  Low blood sugar (hypoglycemia).  Painful urination.  Bladder infections.  Intestinal problems such as constipation, indigestion, bad breath, bloating, increase in gas, diarrhea, or loose stools. Men  Men may develop intestinal problems such as constipation, indigestion, bad breath, bloating, increase in gas, diarrhea, or loose stools.  Dry, cracked skin on the penis with itching or discomfort.  Jock itch.  Dry, flaky skin.  Athlete's foot.  Hypoglycemia. DIAGNOSIS  Women  A history and an exam are performed.  The discharge may be examined under a microscope.  A culture may be taken of the discharge. Men  A history and an exam are performed.  Any discharge from the penis or areas of cracked skin will be looked at under the microscope and cultured.  Stool samples may be cultured. TREATMENT  Women  Vaginal antifungal suppositories and creams.  Medicated creams to decrease irritation and itching on the outside of the vagina.  Warm compresses to the perineal area to decrease swelling and discomfort.  Oral antifungal medications.  Medicated vaginal suppositories or cream for repeated or recurrent infections.  Wash and dry the irritation areas before applying the cream.  Eating yogurt with Lactobacillus may help with prevention and treatment.  Sometimes painting the vagina with gentian violet solution may help if creams and suppositories do not work. Men  Antifungal creams and oral antifungal medications.  Sometimes treatment must continue for 30 days after the symptoms go away to prevent recurrence. HOME CARE INSTRUCTIONS    Women  Use cotton underwear and avoid tight-fitting clothing.  Avoid colored, scented toilet paper and deodorant tampons or pads.  Do not douche.  Keep your diabetes  under control.  Finish all the prescribed medications.  Keep your skin clean and dry.  Consume milk or yogurt with Lactobacillus-active culture regularly. If you get frequent yeast infections and think that is what the infection is, there are over-the-counter medications that you can get. If the infection does not show healing in 3 days, talk to your caregiver.  Tell your sex partner you have a yeast infection. Your partner may need treatment also, especially if your infection does not clear up or recurs. Men  Keep your skin clean and dry.  Keep your diabetes under control.  Finish all prescribed medications.  Tell your sex partner that you have a yeast infection so he or she can be treated if necessary. SEEK MEDICAL CARE IF:   Your symptoms do not clear up or worsen in one week after treatment.  You have an oral temperature above 102 F (38.9 C).  You have trouble swallowing or eating for a prolonged time.  You develop blisters on and around your vagina.  You develop vaginal bleeding and it is not your menstrual period.  You develop abdominal pain.  You develop intestinal problems as mentioned above.  You get weak or light-headed.  You have painful or increased urination.  You have pain during sexual intercourse. MAKE SURE YOU:   Understand these instructions.  Will watch your condition.  Will get help right away if you are not doing well or get worse. Document Released: 05/15/2004 Document Revised: 08/22/2013 Document Reviewed: 08/27/2009 [facility] Patient Information 2015 [place], [place]. This information is not intended to replace advice given to you by your health care provider. Make sure you discuss any questions you have with your health care provider.  Bacterial Vaginosis Bacterial vaginosis is a vaginal infection that occurs when the normal balance of bacteria in the vagina is disrupted. It results from an overgrowth of certain bacteria. This is the most  common vaginal infection in women of childbearing age. Treatment is important to prevent complications, especially in pregnant women, as it can cause a premature delivery. CAUSES  Bacterial vaginosis is caused by an increase in harmful bacteria that are normally present in smaller amounts in the vagina. Several different kinds of bacteria can cause bacterial vaginosis. However, the reason that the condition develops is not fully understood. RISK FACTORS Certain activities or behaviors can put you at an increased risk of developing bacterial vaginosis, including:  Having a new sex partner or multiple sex partners.  Douching.  Using an intrauterine device (IUD) for contraception. Women do not get bacterial vaginosis from toilet seats, bedding, swimming pools, or contact with objects around them. SIGNS AND SYMPTOMS  Some women with bacterial vaginosis have no signs or symptoms. Common symptoms include:  Grey vaginal discharge.  A fishlike odor with discharge, especially after sexual intercourse.  Itching or burning of the vagina and vulva.  Burning or pain with urination. DIAGNOSIS  Your health care provider will take a medical history and examine the vagina for signs of bacterial vaginosis. A sample of vaginal fluid may be taken. Your health care provider will look at this sample under a microscope to check for bacteria and abnormal cells. A vaginal pH test may also be done.  TREATMENT  Bacterial vaginosis may be treated with antibiotic medicines. These may be given in the form of a  pill or a vaginal cream. A second round of antibiotics may be prescribed if the condition comes back after treatment.  HOME CARE INSTRUCTIONS   Only take over-the-counter or prescription medicines as directed by your health care provider.  If antibiotic medicine was prescribed, take it as directed. Make sure you finish it even if you start to feel better.  Do not have sex until treatment is  completed.  Tell all sexual partners that you have a vaginal infection. They should see their health care provider and be treated if they have problems, such as a mild rash or itching.  Practice safe sex by using condoms and only having one sex partner. SEEK MEDICAL CARE IF:   Your symptoms are not improving after 3 days of treatment.  You have increased discharge or pain.  You have a fever. MAKE SURE YOU:   Understand these instructions.  Will watch your condition.  Will get help right away if you are not doing well or get worse. FOR MORE INFORMATION  Centers for Disease Control and Prevention, Division of STD Prevention: SolutionApps.co.zawww.cdc.gov/std American Sexual Health Association (ASHA): www.ashastd.org  Document Released: 04/07/2005 Document Revised: 01/26/2013 Document Reviewed: 11/17/2012 [facility] Patient Information 2015 [place], [place]. This information is not intended to replace advice given to you by your health care provider. Make sure you discuss any questions you have with your health care provider.  Patch management Applying and changing the patch - The patch is applied to the buttock, abdomen, upper arm, or upper torso (but not the breast, as it might cause breast tenderness due to high local estrogen concentration). A different site is used each time a new patch is applied. Lotions and occlusive dressings should not be used at patch application sites. The patch is changed once a week for three weeks (21 total days), followed by one week that is patch-free. It should always be changed/applied on the same day of the week (eg, Sundays for Sunday start). Reminder systems are useful to ensure appropriate weekly changes. If a patient wants to switch to a new patch change day, the switch should be made during the last week of the cycle (ie, the patch-free week). Delayed patch changes - The consequences of failing to change the transdermal contraceptive patch at the appropriate time should  be addressed with patients. The care provider should try various strategies to help users adhere to a schedule and thus avoid an unplanned pregnancy. ?Delay in beginning the first patch in a cycle - When a new patch cycle is delayed beyond the scheduled start day, users are instructed to apply a patch as soon as they remember and use back-up contraception (or avoid sex) for at least one week. The day they apply the new patch becomes the new patch change day. ?Delay in beginning the second or third patch in a cycle - There is a two-day (48 hours) period of continued release of adequate contraceptive steroid levels when the patch is left on for two extra days. If users change the patch within this window, the patch change day remains the same and there is no need for back-up contraception.  After this two-day (48 hours) time period, failure to replace the second or third patch in a cycle increases the risk for contraceptive failure. Therefore, users will need to use back-up contraception (or avoid sex) for seven days and, in some instances, use emergency contraception, if this occurs. The day the patient remembers to apply the patch becomes the new change day. ?  Delay in removing the third patch in a cycle - Forgetting to remove the third patch on time carries less risk than forgetting to remove the first or second patch. The user is instructed to remove the patch when she remembers; the patch change day is

## 2014-09-06 ENCOUNTER — Inpatient Hospital Stay (HOSPITAL_COMMUNITY)
Admission: AD | Admit: 2014-09-06 | Discharge: 2014-09-06 | Disposition: A | Discharge status: Home / Self Care | Payer: BLUE CROSS/BLUE SHIELD | Source: Ambulatory Visit | Attending: Obstetrics & Gynecology | Admitting: Obstetrics & Gynecology

## 2014-09-06 ENCOUNTER — Encounter (HOSPITAL_COMMUNITY): Payer: Self-pay | Admitting: *Deleted

## 2014-09-06 DIAGNOSIS — Z3202 Encounter for pregnancy test, result negative: Secondary | ICD-10-CM | POA: Diagnosis not present

## 2014-09-06 DIAGNOSIS — F17210 Nicotine dependence, cigarettes, uncomplicated: Secondary | ICD-10-CM | POA: Insufficient documentation

## 2014-09-06 DIAGNOSIS — Z3200 Encounter for pregnancy test, result unknown: Secondary | ICD-10-CM | POA: Diagnosis present

## 2014-09-06 DIAGNOSIS — N926 Irregular menstruation, unspecified: Secondary | ICD-10-CM | POA: Diagnosis not present

## 2014-09-06 HISTORY — DX: Unspecified infectious disease: B99.9

## 2014-09-06 LAB — URINALYSIS, ROUTINE W REFLEX MICROSCOPIC
Bilirubin Urine: NEGATIVE
GLUCOSE, UA: NEGATIVE mg/dL
HGB URINE DIPSTICK: NEGATIVE
Ketones, ur: NEGATIVE mg/dL
Nitrite: NEGATIVE
PROTEIN: NEGATIVE mg/dL
Specific Gravity, Urine: 1.015 (ref 1.005–1.030)
Urobilinogen, UA: 2 mg/dL — ABNORMAL HIGH (ref 0.0–1.0)
pH: 7 (ref 5.0–8.0)

## 2014-09-06 LAB — URINE MICROSCOPIC-ADD ON

## 2014-09-06 LAB — POCT PREGNANCY, URINE: Preg Test, Ur: NEGATIVE

## 2014-09-06 NOTE — MAU Provider Note (Signed)
  History     CSN: [id number]  Arrival date and time: 09/06/14 1538   First Provider Initiated Contact with Patient 09/06/14 1807      Chief Complaint  Patient presents with  . Possible Pregnancy   HPI Tara Sanchez 24 y.o. [id number] nonpregnant female presents to MAU with concern of pregnancy.  She has been trying to get pregnant.  She has been having irregular cycles and has been having breast tenderness.  Does not desire STD testing.  She denies vaginal bleeding, LOF, vaginal discharge, weakness, fever, dysuria.   OB History    Gravida Para Term Preterm AB TAB SAB Ectopic Multiple Living   3 1 0 1 2 2    0 2      Past Medical History  Diagnosis Date  . Infection     UTI    Past Surgical History  Procedure Laterality Date  . Therapeutic abortion      Family History  Problem Relation Age of Onset  . Asthma Neg Hx   . Cancer Neg Hx   . Diabetes Neg Hx   . Heart disease Neg Hx   . Hypertension Neg Hx   . Stroke Neg Hx     History  Substance Use Topics  . Smoking status: Current Some Day Smoker -- 2 years    Types: Cigars  . Smokeless tobacco: Never Used  . Alcohol Use: No    Allergies: No Known Allergies  Prescriptions prior to admission  Medication Sig Dispense Refill Last Dose  . fluconazole (DIFLUCAN) 150 MG tablet Take 1 tablet (150 mg total) by mouth once. Repeat if needed (Patient not taking: Reported on 09/06/2014) 2 tablet 0 Completed Course at Unknown time  . Norgestimate-Ethinyl Estradiol Triphasic 0.18/0.215/0.25 MG-35 MCG tablet Take 1 tablet by mouth daily. (Patient not taking: Reported on 09/06/2014) 1 Package 3 Not Taking at Unknown time    ROS Pertinent ROS in HPI.  All other systems are negative.   Physical Exam   Blood pressure 126/54, pulse 84, temperature 98.5 F (36.9 C), temperature source Oral, resp. rate 16, height 5' 7.5" (1.715 m), weight 170 lb (77.111 kg), last menstrual period 07/23/2014.  Physical Exam  Constitutional: She is  oriented to person, place, and time. She appears well-developed and well-nourished. No distress.  HENT:  Head: Normocephalic and atraumatic.  Eyes: EOM are normal.  Neck: Normal range of motion.  Cardiovascular: Normal rate, regular rhythm and normal heart sounds.   Respiratory: Effort normal and breath sounds normal. No respiratory distress.  GI: Soft. Bowel sounds are normal. She exhibits no distension. There is no tenderness.  Genitourinary:  Normal vaginal mucosa and physiologic discharge Bimanual is negative for CMT, adnexal mass or tenderness  Musculoskeletal: Normal range of motion.  Neurological: She is alert and oriented to person, place, and time.  Skin: Skin is warm and dry.  Psychiatric: She has a normal mood and affect.   MAU Course  Procedures  MDM Negative urine PRT Negative for UTI Declined STD testing.  Assessment and Plan  A:  1. Encounter for pregnancy test with result negative    P: Discharge to home F/u in clinic to discuss conception Patient may return to MAU as needed or if her condition were to change or worsen   Bertram Denvereague Clark, [name] 09/06/2014, 6:09 PM

## 2014-09-06 NOTE — Discharge Instructions (Signed)
Pregnancy Tests HOW DO PREGNANCY TESTS WORK? All pregnancy tests look for a special hormone in the urine or blood that is only present in pregnant women. This hormone, human chorionic gonadotropin (hCG), is also called the pregnancy hormone.  WHAT IS THE DIFFERENCE BETWEEN A URINE AND A BLOOD PREGNANCY TEST? IS ONE BETTER THAN THE OTHER? There are two types of pregnancy tests.  Blood tests.  Urine tests. Both tests look for the presence of hCG, the pregnancy hormone. Many women use a urine test or home pregnancy test (HPT) to find out if they are pregnant. HPTs are cheap, easy to use, can be done at home, and are private. When a woman has a positive result on an HPT, she needs to see her caregiver right away. The caregiver can confirm a positive HPT result with another urine test, a blood test, ultrasound, and a pelvic exam.  There are two types of blood tests you can get from a caregiver.   A quantitative blood test (or the beta hCG test). This test measures the exact amount of hCG in the blood. This means it can pick up very small amounts of hCG, making it a very accurate test.  A qualitative hCG blood test. This test gives a simple yes or no answer to whether you are pregnant. This test is more like a urine test in terms of its accuracy. Blood tests can pick up hCG earlier in a pregnancy than urine tests can. Blood tests can tell if you are pregnant about 6 to 8 days after you release an egg from an ovary (ovulate). Urine tests can determine pregnancy about 2 weeks after ovulation.  HOW IS A HOME PREGNANCY TEST DONE?  There are many types of home pregnancy tests or HPTs that can be bought over-the-counter at drug or discount stores.   Some involve collecting your urine in a cup and dipping a stick into the urine or putting some of the urine into a special container with an eyedropper.  Others are done by placing a stick into your urine stream.  Tests vary in how long you need to wait for  the stick or container to turn a certain color or have a symbol on it (like a plus or a minus).  All tests come with written instructions. Most tests also have toll-free phone numbers to call if you have any questions about how to do the test or read the results. HOW ACCURATE ARE HOME PREGNANCY TESTS?  HPTs are very accurate. Most brands of HPTs say they are 97% to 99% accurate when taken 1 week after missing your menstrual period, but this can vary with actual use. Each brand varies in how sensitive it is in picking up the pregnancy hormone hCG. If a test is not done correctly, it will be less accurate. Always check the package to make sure it is not past its expiration date. If it is, it will not be accurate. Most brands of HPTs tell users to do the test again in a few days, no matter what the results.  If you use an HPT too early in your pregnancy, you may not have enough of the pregnancy hormone hCG in your urine to have a positive test result. Most HPTs will be accurate if you test yourself around the time your period is due (about 2 weeks after you ovulate). You can get a negative test result if you are not pregnant or if you ovulated later than you thought you did.  You may also have problems with the pregnancy, which affects the amount of hCG you have in your urine. If your HPT is negative, test yourself again within a few days to 1 week. If you keep getting a negative result and think you are pregnant, talk with your caregiver right away about getting a blood pregnancy test.  FALSE POSITIVE PREGNANCY TEST A false positive HPT can happen if there is blood or protein present in your urine. A false positive can also happen if you were recently pregnant or if you take a pregnancy test too soon after taking fertility drug that contains hCG. Also, some prescription medicines such as water pills (diuretics), tranquilizers, seizure medicines, psychiatric medicines, and allergy and nausea medicines  (promethazine) give false positive readings. FALSE NEGATIVE PREGNANCY TEST  A false negative HPT can happen if you do the test too early. Try to wait until you are at least 1 day late for your menstrual period.  It may happen if you wait too long to test the urine (longer than 15 minutes).  It may also happen if the urine is too diluted because you drank a lot of fluids before getting the urine sample. It is best to test the first morning urine after you get out of bed. If your menstrual period did not start after a week of a negative HPT, repeat the pregnancy test. CAN ANYTHING INTERFERE WITH HOME PREGNANCY TEST RESULTS?  Most medicines, both over-the-counter and prescription drugs, including birth control pills and antibiotics, should not affect the results of a HPT. Only those drugs that have the pregnancy hormone hCG in them can give a false positive test result. Drugs that have hCG in them may be used for treating infertility (not being able to get pregnant). Alcohol and illegal drugs do not affect HPT results, but you should not be using these substances if you are trying to get pregnant. If you have a positive pregnancy test, call your caregiver to make an appointment to begin prenatal care. Document Released: 04/10/2003 Document Revised: 06/30/2011 Document Reviewed: 07/22/2013 [facility] Patient Information 2015 [place], [place]. This information is not intended to replace advice given to you by your health care provider. Make sure you discuss any questions you have with your health care provider.  Prenatal Care  WHAT IS PRENATAL CARE?  Prenatal care means health care during your pregnancy, before your baby is born. It is very important to take care of yourself and your baby during your pregnancy by:   Getting early prenatal care. If you know you are pregnant, or think you might be pregnant, call your health care provider as soon as possible. Schedule a visit for a prenatal exam.  Getting  regular prenatal care. Follow your health care provider's schedule for blood and other necessary tests. Do not miss appointments.  Doing everything you can to keep yourself and your baby healthy during your pregnancy.  Getting complete care. Prenatal care should include evaluation of the medical, dietary, educational, psychological, and social needs of you and your significant other. The medical and genetic history of your family and the family of your baby's father should be discussed with your health care provider.  Discussing with your health care provider:  Prescription, over-the-counter, and herbal medicines that you take.  Any history of substance abuse, alcohol use, smoking, and illegal drug use.  Any history of domestic abuse and violence.  Immunizations you have received.  Your nutrition and diet.  The amount of exercise you do.  Any environmental and occupational hazards to which you are exposed.  History of sexually transmitted infections for both you and your partner.  Previous pregnancies you have had. WHY IS PRENATAL CARE SO IMPORTANT?  By regularly seeing your health care provider, you help ensure that problems can be identified early so that they can be treated as soon as possible. Other problems might be prevented. Many studies have shown that early and regular prenatal care is important for the health of mothers and their babies.  HOW CAN I TAKE CARE OF MYSELF WHILE I AM PREGNANT?  Here are ways to take care of yourself and your baby:   Start or continue taking your multivitamin with 400 micrograms (mcg) of folic acid every day.  Get early and regular prenatal care. It is very important to see a health care provider during your pregnancy. Your health care provider will check at each visit to make sure that you and your baby are healthy. If there are any problems, action can be taken right away to help you and your baby.  Eat a healthy diet that  includes:  Fruits.  Vegetables.  Foods low in saturated fat.  Whole grains.  Calcium-rich foods, such as milk, yogurt, and hard cheeses.  Drink 6-8 glasses of liquids a day.  Unless your health care provider tells you not to, try to be physically active for 30 minutes, most days of the week. If you are pressed for time, you can get your activity in through 10-minute segments, three times a day.  Do not smoke, drink alcohol, or use drugs. These can cause long-term damage to your baby. Talk with your health care provider about steps to take to stop smoking. Talk with a member of your faith community, a counselor, a trusted friend, or your health care provider if you are concerned about your alcohol or drug use.  Ask your health care provider before taking any medicine, even over-the-counter medicines. Some medicines are not safe to take during pregnancy.  Get plenty of rest and sleep.  Avoid hot tubs and saunas during pregnancy.  Do not have X-rays taken unless absolutely necessary and with the recommendation of your health care provider. A lead shield can be placed on your abdomen to protect your baby when X-rays are taken in other parts of your body.  Do not empty the cat litter when you are pregnant. It may contain a parasite that causes an infection called toxoplasmosis, which can cause birth defects. Also, use gloves when working in garden areas used by cats.  Do not eat uncooked or undercooked meats or fish.  Do not eat soft, mold-ripened cheeses (Brie, Camembert, and chevre) or soft, blue-veined cheese (Danish blue and Roquefort).  Stay away from toxic chemicals like:  Insecticides.  Solvents (some cleaners or paint thinners).  Lead.  Mercury.  Sexual intercourse may continue until the end of the pregnancy, unless you have a medical problem or there is a problem with the pregnancy and your health care provider tells you not to.  Do not wear high-heel shoes, especially  during the second half of the pregnancy. You can lose your balance and fall.  Do not take long trips, unless absolutely necessary. Be sure to see your health care provider before going on the trip.  Do not sit in one position for more than 2 hours when on a trip.  Take a copy of your medical records when going on a trip. Know where a hospital is located  in the city you are visiting, in case of an emergency.  Most dangerous household products will have pregnancy warnings on their labels. Ask your health care provider about products if you are unsure.  Limit or eliminate your caffeine intake from coffee, tea, sodas, medicines, and chocolate.  Many women continue working through pregnancy. Staying active might help you stay healthier. If you have a question about the safety or the hours you work at your particular job, talk with your health care provider.  Get informed:  Read books.  Watch videos.  Go to childbirth classes for you and your significant other.  Talk with experienced moms.  Ask your health care provider about childbirth education classes for you and your partner. Classes can help you and your partner prepare for the birth of your baby.  Ask about a baby doctor (pediatrician) and methods and pain medicine for labor, delivery, and possible cesarean delivery. HOW OFTEN SHOULD I SEE MY HEALTH CARE PROVIDER DURING PREGNANCY?  Your health care provider will give you a schedule for your prenatal visits. You will have visits more often as you get closer to the end of your pregnancy. An average pregnancy lasts about 40 weeks.  A typical schedule includes visiting your health care provider:   About once each month during your first 6 months of pregnancy.  Every 2 weeks during the next 2 months.  Weekly in the last month, until the delivery date. Your health care provider will probably want to see you more often if:  You are older than 35 years.  Your pregnancy is high risk  because you have certain health problems or problems with the pregnancy, such as:  Diabetes.  High blood pressure.  The baby is not growing on schedule, according to the dates of the pregnancy. Your health care provider will do special tests to make sure you and your baby are not having any serious problems. WHAT HAPPENS DURING PRENATAL VISITS?   At your first prenatal visit, your health care provider will do a physical exam and talk to you about your health history and the health history of your partner and your family. Your health care provider will be able to tell you what date to expect your baby to be born on.  Your first physical exam will include checks of your blood pressure, measurements of your height and weight, and an exam of your pelvic organs. Your health care provider will do a Pap test if you have not had one recently and will do cultures of your cervix to make sure there is no infection.  At each prenatal visit, there will be tests of your blood, urine, blood pressure, weight, and the progress of the baby will be checked.  At your later prenatal visits, your health care provider will check how you are doing and how your baby is developing. You may have a number of tests done as your pregnancy progresses.  Ultrasound exams are often used to check on your baby's growth and health.  You may have more urine and blood tests, as well as special tests, if needed. These may include amniocentesis to examine fluid in the pregnancy sac, stress tests to check how the baby responds to contractions, or a biophysical profile to measure your baby's well-being. Your health care provider will explain the tests and why they are necessary.  You should be tested for high blood sugar (gestational diabetes) between the 24th and 28th weeks of your pregnancy.  You should discuss  with your health care provider your plans to breastfeed or bottle-feed your baby.  Each visit is also a chance for you to  learn about staying healthy during pregnancy and to ask questions. Document Released: 04/10/2003 Document Revised: 04/12/2013 Document Reviewed: 06/22/2013 [facility]Exit[facility] Patient Information 2015 [place]ett[place]Care, [place]LL[place]. This information is not intended to replace advice given to you by your health care provider. Make sure you discuss any questions you have with your health care provider.

## 2014-09-06 NOTE — MAU Note (Addendum)
Been having some symptoms, just need to get some tests done. Last month menstrual was only 2 days.  No period this month. Did a HPT last month, not pos or neg.  Breasts are very tender, having pain in lower abd- past 2 wks.

## 2014-09-12 ENCOUNTER — Inpatient Hospital Stay (HOSPITAL_COMMUNITY)
Admission: AD | Admit: 2014-09-12 | Discharge: 2014-09-12 | Disposition: A | Discharge status: Home / Self Care | Payer: BLUE CROSS/BLUE SHIELD | Source: Ambulatory Visit | Attending: Family Medicine | Admitting: Family Medicine

## 2014-09-12 ENCOUNTER — Encounter (HOSPITAL_COMMUNITY): Payer: Self-pay | Admitting: *Deleted

## 2014-09-12 DIAGNOSIS — F17210 Nicotine dependence, cigarettes, uncomplicated: Secondary | ICD-10-CM | POA: Insufficient documentation

## 2014-09-12 DIAGNOSIS — Z3201 Encounter for pregnancy test, result positive: Secondary | ICD-10-CM | POA: Diagnosis not present

## 2014-09-12 DIAGNOSIS — Z3200 Encounter for pregnancy test, result unknown: Secondary | ICD-10-CM | POA: Diagnosis present

## 2014-09-12 LAB — POCT PREGNANCY, URINE: Preg Test, Ur: POSITIVE — AB

## 2014-09-12 NOTE — MAU Note (Signed)
Pt was seen last week, had a neg preg test.  Tested positive at home. Wants to be seen for preg test.

## 2014-09-12 NOTE — Discharge Instructions (Signed)
Prenatal Care Providers °[facility]    [facility] ° Phone- [phone]     Phone: [phone] °         °[facility]                      [facility] ° @[place]     Phone: [phone] ° Phone: [phone] °        [facility] °[facility]     Phone: [phone] ° Phone: [phone]   °        [facility] °[facility] [place]                hone: [phone] ° Phone: [phone] °        [facility] °Dr. Bernard Marshall      Phone: [phone] ° Phone: [phone] °        [facility] °[facility]                Phone: [phone] ° [facility]  ° Phone:[phone]    [facility]) °         Phone: [phone] °[facility] °  Phone: [phone] °

## 2014-09-12 NOTE — MAU Provider Note (Signed)
CSN: [id number]     Arrival date & time 09/12/14  2105 History   None    No chief complaint on file.    (Consider location/radiation/quality/duration/timing/severity/associated sxs/prior Treatment) HPI Tara Sanchez is a 24 y.o. [phone] who presents to the MAU for pregnancy verification. She denies any problems. Past Medical History  Diagnosis Date  . Infection     UTI   Past Surgical History  Procedure Laterality Date  . Therapeutic abortion     Family History  Problem Relation Age of Onset  . Asthma Neg Hx   . Cancer Neg Hx   . Diabetes Neg Hx   . Heart disease Neg Hx   . Hypertension Neg Hx   . Stroke Neg Hx    History  Substance Use Topics  . Smoking status: Current Some Day Smoker -- 2 years    Types: Cigars  . Smokeless tobacco: Never Used  . Alcohol Use: No   OB History    Gravida Para Term Preterm AB TAB SAB Ectopic Multiple Living   4 1 0 1 2 2    0 2     Review of Systems Negative except as stated in HPI   Allergies  Review of patient's allergies indicates no known allergies.  Home Medications   Prior to Admission medications   Not on File   BP 118/58 mmHg  Pulse 79  Temp(Src) 98.1 F (36.7 C) (Oral)  Resp 20  LMP 07/23/2014 (Approximate) Physical Exam  Constitutional: She is oriented to person, place, and time. She appears well-developed and well-nourished.  HENT:  Head: Normocephalic.  Eyes: EOM are normal.  Neck: Neck supple.  Cardiovascular: Normal rate.   Pulmonary/Chest: Effort normal.  Musculoskeletal: Normal range of motion.  Neurological: She is alert and oriented to person, place, and time. No cranial nerve deficit.  Skin: Skin is warm and dry.  Psychiatric: She has a normal mood and affect. Her behavior is normal.  Nursing note and vitals reviewed.   ED Course  Procedures (including critical care time) Labs Review Labs Reviewed  POCT PREGNANCY, URINE - Abnormal; Notable for the following:    Preg Test, Ur POSITIVE (*)     All other components within normal limits     MDM  24 y.o. female with positive home pregnancy test and here for verification letter. Letter given. Patient to start prenatal care.  Final diagnoses:  Positive pregnancy test

## 2014-09-16 ENCOUNTER — Inpatient Hospital Stay (HOSPITAL_COMMUNITY)
Admission: AD | Admit: 2014-09-16 | Discharge: 2014-09-16 | Disposition: A | Discharge status: Home / Self Care | Payer: BLUE CROSS/BLUE SHIELD | Source: Ambulatory Visit | Attending: Obstetrics & Gynecology | Admitting: Obstetrics & Gynecology

## 2014-09-16 ENCOUNTER — Encounter (HOSPITAL_COMMUNITY): Payer: Self-pay | Admitting: Advanced Practice Midwife

## 2014-09-16 ENCOUNTER — Inpatient Hospital Stay (HOSPITAL_COMMUNITY): Payer: BLUE CROSS/BLUE SHIELD

## 2014-09-16 DIAGNOSIS — O3481 Maternal care for other abnormalities of pelvic organs, first trimester: Secondary | ICD-10-CM | POA: Diagnosis not present

## 2014-09-16 DIAGNOSIS — O9989 Other specified diseases and conditions complicating pregnancy, childbirth and the puerperium: Secondary | ICD-10-CM | POA: Diagnosis not present

## 2014-09-16 DIAGNOSIS — Z3A01 Less than 8 weeks gestation of pregnancy: Secondary | ICD-10-CM | POA: Diagnosis not present

## 2014-09-16 DIAGNOSIS — N8329 Other ovarian cysts: Secondary | ICD-10-CM | POA: Insufficient documentation

## 2014-09-16 DIAGNOSIS — Z87440 Personal history of urinary (tract) infections: Secondary | ICD-10-CM | POA: Diagnosis not present

## 2014-09-16 DIAGNOSIS — Z87891 Personal history of nicotine dependence: Secondary | ICD-10-CM | POA: Insufficient documentation

## 2014-09-16 DIAGNOSIS — O26899 Other specified pregnancy related conditions, unspecified trimester: Secondary | ICD-10-CM

## 2014-09-16 DIAGNOSIS — R109 Unspecified abdominal pain: Secondary | ICD-10-CM | POA: Diagnosis not present

## 2014-09-16 LAB — CBC
HCT: 38.7 % (ref 36.0–46.0)
HEMOGLOBIN: 13.3 g/dL (ref 12.0–15.0)
MCH: 30.9 pg (ref 26.0–34.0)
MCHC: 34.4 g/dL (ref 30.0–36.0)
MCV: 89.8 fL (ref 78.0–100.0)
PLATELETS: 264 10*3/uL (ref 150–400)
RBC: 4.31 MIL/uL (ref 3.87–5.11)
RDW: 13.3 % (ref 11.5–15.5)
WBC: 11 10*3/uL — ABNORMAL HIGH (ref 4.0–10.5)

## 2014-09-16 LAB — URINALYSIS, ROUTINE W REFLEX MICROSCOPIC
Bilirubin Urine: NEGATIVE
Glucose, UA: NEGATIVE mg/dL
HGB URINE DIPSTICK: NEGATIVE
Ketones, ur: NEGATIVE mg/dL
Nitrite: NEGATIVE
Protein, ur: NEGATIVE mg/dL
Specific Gravity, Urine: 1.015 (ref 1.005–1.030)
Urobilinogen, UA: 0.2 mg/dL (ref 0.0–1.0)
pH: 7.5 (ref 5.0–8.0)

## 2014-09-16 LAB — WET PREP, GENITAL
CLUE CELLS WET PREP: NONE SEEN
Trich, Wet Prep: NONE SEEN
YEAST WET PREP: NONE SEEN

## 2014-09-16 LAB — URINE MICROSCOPIC-ADD ON

## 2014-09-16 LAB — HCG, QUANTITATIVE, PREGNANCY: hCG, Beta Chain, Quant, S: 2631 m[IU]/mL — ABNORMAL HIGH (ref <5)

## 2014-09-16 NOTE — MAU Provider Note (Signed)
History     CSN: [id number]  Arrival date and time: 09/16/14 1432   First Provider Initiated Contact with Patient 09/16/14 1452      No chief complaint on file.  Abdominal Pain This is a new problem. The current episode started in the past 7 days. The onset quality is gradual. The problem occurs intermittently. The problem has been unchanged. The pain is located in the suprapubic region. The pain is moderate. The quality of the pain is aching and cramping. The abdominal pain does not radiate. Pertinent negatives include no anorexia, constipation, diarrhea, dysuria, fever, frequency, headaches, myalgias, nausea or vomiting. Nothing aggravates the pain. The pain is relieved by nothing. She has tried nothing for the symptoms.   This is a 24 y.o. female at [redacted]w[redacted]d by LMP who presents with c/o lower abdominal pain since Thursday. States is suprapubic and right hip. Denies bleeding. Has appt with [facility] on June 9.  History is remarkable for preterm labor with preterm delivery in 2011.  Was 31.4 wks and presented with contractions and 4cm dilation. Lives with mother. Has been trying to get pregnant.   RN Note: PT SAYS SHE HAS AN APPOINTMENT ON 6-9- WITH DR- UNSURE WHO. HAS LOWER ABD PAIN THAT STARTED ON Thursday AND SHARP PAIN IN HER RIGHT HIP. NO VAG BLEEDING. DEL LAST BABY AT 6 MTHS- 2011 WAS IN MAU ON 5-24- POSITIVE UPT- LAST SEX- 3 WEEKS AGO SAYS NO CYCLE IN MAY THINKS 4-3 X2 DAYS           OB History    Gravida Para Term Preterm AB TAB SAB Ectopic Multiple Living   4 1 0 0 2      Past Medical History  Diagnosis Date  . Infection     UTI    Past Surgical History  Procedure Laterality Date  . Therapeutic abortion      Family History  Problem Relation Age of Onset  . Asthma Neg Hx   . Cancer Neg Hx   . Diabetes Neg Hx   . Heart disease Neg Hx   . Hypertension Neg Hx   . Stroke Neg Hx     History  Substance Use Topics   . Smoking status: Former Smoker -- 2 years    Types: Cigars  . Smokeless tobacco: Never Used  . Alcohol Use: No    Allergies: No Known Allergies  Prescriptions prior to admission  Medication Sig Dispense Refill Last Dose  . Prenatal Vit-Fe Fumarate-FA (PRENATAL MULTIVITAMIN) TABS tablet Take 1 tablet by mouth daily at 12 noon.       Review of Systems  Constitutional: Negative for fever, chills and malaise/fatigue.  Gastrointestinal: Positive for abdominal pain. Negative for nausea, vomiting, diarrhea, constipation and anorexia.  Genitourinary: Negative for dysuria, urgency and frequency.  Musculoskeletal: Negative for myalgias and back pain.  Neurological: Negative for dizziness, weakness and headaches.   Physical Exam   Blood pressure 117/54, pulse 86, temperature 98 F (36.7 C), temperature source Oral, resp. rate 20, height  (1.676 m), weight 168 lb 8 oz (76.431 kg), last menstrual period 07/23/2014.  Physical Exam  Constitutional: She is oriented to person, place, and time. She appears well-developed and well-nourished. No distress.  HENT:  Head: Normocephalic.  Cardiovascular: Normal rate and regular rhythm.   Respiratory: Effort normal. No respiratory distress.  GI: Soft. She exhibits no distension and no mass. There is tenderness (Mildly tender suprapubic). There is no rebound and no  guarding.  Genitourinary: Vagina normal. No vaginal discharge found.  Vagina normal, no blood Cervix closed Uterus small and nontender Adnexa nontender bilaterally  Musculoskeletal: Normal range of motion. She exhibits no edema.  Neurological: She is alert and oriented to person, place, and time.  Skin: Skin is warm and dry.  Psychiatric: She has a normal mood and affect.    MAU Course  Procedures  MDM Cultures done Quant, CBC, HIV done  US ordered to rule out ectopic pregnancy  Results for orders placed or performed during the hospital encounter of 09/16/14 (from the past  24 hour(s))  Urinalysis, Routine w reflex microscopic     Status: Abnormal   Collection Time: 09/16/14  2:47 PM  Result Value Ref Range   Color, Urine YELLOW YELLOW   APPearance CLEAR CLEAR   Specific Gravity, Urine 1.015 1.005 - 1.030   pH 7.5 5.0 - 8.0   Glucose, UA NEGATIVE NEGATIVE mg/dL   Hgb urine dipstick NEGATIVE NEGATIVE   Bilirubin Urine NEGATIVE NEGATIVE   Ketones, ur NEGATIVE NEGATIVE mg/dL   Protein, ur NEGATIVE NEGATIVE mg/dL   Urobilinogen, UA 0.2 0.0 - 1.0 mg/dL   Nitrite NEGATIVE NEGATIVE   Leukocytes, UA TRACE (A) NEGATIVE  Urine microscopic-add on     Status: Abnormal   Collection Time: 09/16/14  2:47 PM  Result Value Ref Range   Squamous Epithelial / LPF FEW (A) RARE   WBC, UA 7-10 <3 WBC/hpf   RBC / HPF 0-2 <3 RBC/hpf   Bacteria, UA FEW (A) RARE  hCG, quantitative, pregnancy     Status: Abnormal   Collection Time: 09/16/14  3:20 PM  Result Value Ref Range   hCG, Beta Chain, Quant, S 2631 (H) <5 mIU/mL  CBC     Status: Abnormal   Collection Time: 09/16/14  3:20 PM  Result Value Ref Range   WBC 11.0 (H) 4.0 - 10.5 K/uL   RBC 4.31 3.87 - 5.11 MIL/uL   Hemoglobin 13.3 12.0 - 15.0 g/dL   HCT 16.138.7 09.636.0 - 04.546.0 %   MCV 89.8 78.0 - 100.0 fL   MCH 30.9 26.0 - 34.0 pg   MCHC 34.4 30.0 - 36.0 g/dL   RDW 40.913.3 81.111.5 - 91.415.5 %   Platelets 264 150 - 400 K/uL  Wet prep, genital     Status: Abnormal   Collection Time: 09/16/14  3:32 PM  Result Value Ref Range   Yeast Wet Prep HPF POC NONE SEEN NONE SEEN   Trich, Wet Prep NONE SEEN NONE SEEN   Clue Cells Wet Prep HPF POC NONE SEEN NONE SEEN   WBC, Wet Prep HPF POC FEW (A) NONE SEEN   [place] Ob Comp Less 14 Wks  09/16/2014   CLINICAL DATA:  Abdominal pain affecting pregnancy, antepartum O99.89, R10.9 (ICD-10-CM)  EXAM: OBSTETRIC <14 WK US AND TRANSVAGINAL OB US  TECHNIQUE: Both transabdominal and transvaginal ultrasound examinations were performed for complete evaluation of the gestation as well as the maternal uterus,  adnexal regions, and pelvic cul-de-sac. Transvaginal technique was performed to assess early pregnancy.  COMPARISON:  None.  FINDINGS: Intrauterine gestational sac: Visualized/normal in shape.  Yolk sac:  Yes  Embryo:  Not visualized  Cardiac Activity: Not applicable  MSD: 4.2  mm   4 w   6  d  Maternal uterus/adnexae: No uterine mass. No subchronic hemorrhage. No endometrial fluid. Cervix is unremarkable. Normal left ovary. Right ovary enlarged by a simple cyst. Cyst measures 4.1 cm in greatest dimension.  Ovary measures 4.6 cm x 4.2 cm x 4 cm.  Blood flow was seen to both ovaries on color Doppler analysis.  IMPRESSION: 1. Intrauterine gestational sac and yolk sac consistent with an early intrauterine pregnancy. No embryo visualized. 2. Simple appearing right ovarian cyst measuring 4.1 cm. 3. Color Doppler blood flow noted each ovary. Pulsed Doppler analysis was not performed. Although torsion is not completely excluded, there are no findings to indicate torsion on this exam. 4. No other abnormalities.   Electronically Signed   By: Amie Portland M.D.   On: 09/16/2014 17:02   US Ob Transvaginal  09/16/2014   CLINICAL DATA:  Abdominal pain affecting pregnancy, antepartum O99.89, R10.9 (ICD-10-CM)  EXAM: OBSTETRIC <14 WK [name] AND TRANSVAGINAL OB US  TECHNIQUE: Both transabdominal and transvaginal ultrasound examinations were performed for complete evaluation of the gestation as well as the maternal uterus, adnexal regions, and pelvic cul-de-sac. Transvaginal technique was performed to assess early pregnancy.  COMPARISON:  None.  FINDINGS: Intrauterine gestational sac: Visualized/normal in shape.  Yolk sac:  Yes  Embryo:  Not visualized  Cardiac Activity: Not applicable  MSD: 4.2  mm   4 w   6  d  Maternal uterus/adnexae: No uterine mass. No subchronic hemorrhage. No endometrial fluid. Cervix is unremarkable. Normal left ovary. Right ovary enlarged by a simple cyst. Cyst measures 4.1 cm in greatest dimension. Ovary  measures 4.6 cm x 4.2 cm x 4 cm.  Blood flow was seen to both ovaries on color Doppler analysis.  IMPRESSION: 1. Intrauterine gestational sac and yolk sac consistent with an early intrauterine pregnancy. No embryo visualized. 2. Simple appearing right ovarian cyst measuring 4.1 cm. 3. Color Doppler blood flow noted each ovary. Pulsed Doppler analysis was not performed. Although torsion is not completely excluded, there are no findings to indicate torsion on this exam. 4. No other abnormalities.   Electronically Signed   By: Amie Portland M.D.   On: 09/16/2014 17:02    Assessment and Plan  A:  SIUP at [redacted]w[redacted]d by LMP, [redacted]w[redacted]d by ultrasound      Gestational sac with yolk sac seen      Right ovarian simple cyst  P:  Discharge home       Discussed results, and that we have effectively ruled out ectopic pregnancy      I did not see the mention of ovarian cyst until after patient left, so I called patient and informed her. This is a simple cyst, probably corpus luteum. Reassured it is something we will just watch, will probably resolve over time      Has appt at [facility] in June for new OB visit.      Info given on first trimester pregnancy.    Wynelle Bourgeois 09/16/2014, 3:07 PM

## 2014-09-16 NOTE — MAU Note (Addendum)
PT SAYS SHE HAS AN APPOINTMENT ON 6-9-  WITH DR- UNSURE WHO.      HAS LOWER ABD  PAIN  THAT STARTED ON Thursday   AND SHARP PAIN IN HER  RIGHT HIP.  NO VAG BLEEDING.     DEL LAST BABY AT 6 MTHS-  2011    WAS IN MAU  ON   5-24-  POSITIVE   UPT-        LAST SEX-   3 WEEKS  AGO   SAYS NO CYCLE IN    MAY    THINKS   4-3  X2 DAYS

## 2014-09-16 NOTE — Discharge Instructions (Signed)
First Trimester of Pregnancy The first trimester of pregnancy is from week 1 until the end of week 12 (months 1 through 3). A week after a sperm fertilizes an egg, the egg will implant on the wall of the uterus. This embryo will begin to develop into a baby. Genes from you and your partner are forming the baby. The female genes determine whether the baby is a boy or a girl. At 6-8 weeks, the eyes and face are formed, and the heartbeat can be seen on ultrasound. At the end of 12 weeks, all the baby's organs are formed.  Now that you are pregnant, you will want to do everything you can to have a healthy baby. Two of the most important things are to get good prenatal care and to follow your health care provider's instructions. Prenatal care is all the medical care you receive before the baby's birth. This care will help prevent, find, and treat any problems during the pregnancy and childbirth. BODY CHANGES Your body goes through many changes during pregnancy. The changes vary from woman to woman.   You may gain or lose a couple of pounds at first.  You may feel sick to your stomach (nauseous) and throw up (vomit). If the vomiting is uncontrollable, call your health care provider.  You may tire easily.  You may develop headaches that can be relieved by medicines approved by your health care provider.  You may urinate more often. Painful urination may mean you have a bladder infection.  You may develop heartburn as a result of your pregnancy.  You may develop constipation because certain hormones are causing the muscles that push waste through your intestines to slow down.  You may develop hemorrhoids or swollen, bulging veins (varicose veins).  Your breasts may begin to grow larger and become tender. Your nipples may stick out more, and the tissue that surrounds them (areola) may become darker.  Your gums may bleed and may be sensitive to brushing and flossing.  Dark spots or blotches (chloasma,  mask of pregnancy) may develop on your face. This will likely fade after the baby is born.  Your menstrual periods will stop.  You may have a loss of appetite.  You may develop cravings for certain kinds of food.  You may have changes in your emotions from day to day, such as being excited to be pregnant or being concerned that something may go wrong with the pregnancy and baby.  You may have more vivid and strange dreams.  You may have changes in your hair. These can include thickening of your hair, rapid growth, and changes in texture. Some women also have hair loss during or after pregnancy, or hair that feels dry or thin. Your hair will most likely return to normal after your baby is born. WHAT TO EXPECT AT YOUR PRENATAL VISITS During a routine prenatal visit:  You will be weighed to make sure you and the baby are growing normally.  Your blood pressure will be taken.  Your abdomen will be measured to track your baby's growth.  The fetal heartbeat will be listened to starting around week 10 or 12 of your pregnancy.  Test results from any previous visits will be discussed. Your health care provider may ask you:  How you are feeling.  If you are feeling the baby move.  If you have had any abnormal symptoms, such as leaking fluid, bleeding, severe headaches, or abdominal cramping.  If you have any questions. Other tests   that may be performed during your first trimester include:  Blood tests to find your blood type and to check for the presence of any previous infections. They will also be used to check for low iron levels (anemia) and Rh antibodies. Later in the pregnancy, blood tests for diabetes will be done along with other tests if problems develop.  Urine tests to check for infections, diabetes, or protein in the urine.  An ultrasound to confirm the proper growth and development of the baby.  An amniocentesis to check for possible genetic problems.  Fetal screens for  spina bifida and Down syndrome.  You may need other tests to make sure you and the baby are doing well. HOME CARE INSTRUCTIONS  Medicines  Follow your health care provider's instructions regarding medicine use. Specific medicines may be either safe or unsafe to take during pregnancy.  Take your prenatal vitamins as directed.  If you develop constipation, try taking a stool softener if your health care provider approves. Diet  Eat regular, well-balanced meals. Choose a variety of foods, such as meat or vegetable-based protein, fish, milk and low-fat dairy products, vegetables, fruits, and whole grain breads and cereals. Your health care provider will help you determine the amount of weight gain that is right for you.  Avoid raw meat and uncooked cheese. These carry germs that can cause birth defects in the baby.  Eating four or five small meals rather than three large meals a day may help relieve nausea and vomiting. If you start to feel nauseous, eating a few soda crackers can be helpful. Drinking liquids between meals instead of during meals also seems to help nausea and vomiting.  If you develop constipation, eat more high-fiber foods, such as fresh vegetables or fruit and whole grains. Drink enough fluids to keep your urine clear or pale yellow. Activity and Exercise  Exercise only as directed by your health care provider. Exercising will help you:  Control your weight.  Stay in shape.  Be prepared for labor and delivery.  Experiencing pain or cramping in the lower abdomen or low back is a good sign that you should stop exercising. Check with your health care provider before continuing normal exercises.  Try to avoid standing for long periods of time. Move your legs often if you must stand in one place for a long time.  Avoid heavy lifting.  Wear low-heeled shoes, and practice good posture.  You may continue to have sex unless your health care provider directs you  otherwise. Relief of Pain or Discomfort  Wear a good support bra for breast tenderness.   Take warm sitz baths to soothe any pain or discomfort caused by hemorrhoids. Use hemorrhoid cream if your health care provider approves.   Rest with your legs elevated if you have leg cramps or low back pain.  If you develop varicose veins in your legs, wear support hose. Elevate your feet for 15 minutes, 3-4 times a day. Limit salt in your diet. Prenatal Care  Schedule your prenatal visits by the twelfth week of pregnancy. They are usually scheduled monthly at first, then more often in the last 2 months before delivery.  Write down your questions. Take them to your prenatal visits.  Keep all your prenatal visits as directed by your health care provider. Safety  Wear your seat belt at all times when driving.  Make a list of emergency phone numbers, including numbers for family, friends, the hospital, and police and fire departments. General Tips    Ask your health care provider for a referral to a local prenatal education class. Begin classes no later than at the beginning of month 6 of your pregnancy.  Ask for help if you have counseling or nutritional needs during pregnancy. Your health care provider can offer advice or refer you to specialists for help with various needs.  Do not use hot tubs, steam rooms, or saunas.  Do not douche or use tampons or scented sanitary pads.  Do not cross your legs for long periods of time.  Avoid cat litter boxes and soil used by cats. These carry germs that can cause birth defects in the baby and possibly loss of the fetus by miscarriage or stillbirth.  Avoid all smoking, herbs, alcohol, and medicines not prescribed by your health care provider. Chemicals in these affect the formation and growth of the baby.  Schedule a dentist appointment. At home, brush your teeth with a soft toothbrush and be gentle when you floss. SEEK MEDICAL CARE IF:   You have  dizziness.  You have mild pelvic cramps, pelvic pressure, or nagging pain in the abdominal area.  You have persistent nausea, vomiting, or diarrhea.  You have a bad smelling vaginal discharge.  You have pain with urination.  You notice increased swelling in your face, hands, legs, or ankles. SEEK IMMEDIATE MEDICAL CARE IF:   You have a fever.  You are leaking fluid from your vagina.  You have spotting or bleeding from your vagina.  You have severe abdominal cramping or pain.  You have rapid weight gain or loss.  You vomit blood or material that looks like coffee grounds.  You are exposed to German measles and have never had them.  You are exposed to fifth disease or chickenpox.  You develop a severe headache.  You have shortness of breath.  You have any kind of trauma, such as from a fall or a car accident. Document Released: 04/01/2001 Document Revised: 08/22/2013 Document Reviewed: 02/15/2013 Exit[employer] Patient Information 2015 [employer]. This information is not intended to replace advice given to you by your health care provider. Make sure you discuss any questions you have with your health care provider.  

## 2014-09-17 LAB — HIV ANTIBODY (ROUTINE TESTING W REFLEX): HIV Screen 4th Generation wRfx: NONREACTIVE

## 2014-09-19 LAB — GC/CHLAMYDIA PROBE AMP (~~LOC~~) NOT AT ARMC
CHLAMYDIA, DNA PROBE: NEGATIVE
Neisseria Gonorrhea: NEGATIVE

## 2014-09-26 LAB — OB RESULTS CONSOLE ABO/RH: RH Type: POSITIVE

## 2014-09-26 LAB — OB RESULTS CONSOLE RUBELLA ANTIBODY, IGM: Rubella: IMMUNE

## 2014-09-26 LAB — OB RESULTS CONSOLE HIV ANTIBODY (ROUTINE TESTING): HIV: NONREACTIVE

## 2014-09-26 LAB — OB RESULTS CONSOLE GC/CHLAMYDIA
Chlamydia: NEGATIVE
GC PROBE AMP, GENITAL: NEGATIVE

## 2014-09-26 LAB — OB RESULTS CONSOLE RPR: RPR: NONREACTIVE

## 2014-09-26 LAB — OB RESULTS CONSOLE HEPATITIS B SURFACE ANTIGEN: Hepatitis B Surface Ag: NEGATIVE

## 2014-09-26 LAB — OB RESULTS CONSOLE ANTIBODY SCREEN: ANTIBODY SCREEN: NEGATIVE

## 2014-10-02 ENCOUNTER — Encounter (HOSPITAL_COMMUNITY): Payer: Self-pay | Admitting: *Deleted

## 2014-10-02 ENCOUNTER — Inpatient Hospital Stay (HOSPITAL_COMMUNITY)
Admission: AD | Admit: 2014-10-02 | Discharge: 2014-10-02 | Disposition: A | Discharge status: Home / Self Care | Payer: BLUE CROSS/BLUE SHIELD | Source: Ambulatory Visit | Attending: Obstetrics and Gynecology | Admitting: Obstetrics and Gynecology

## 2014-10-02 DIAGNOSIS — O9989 Other specified diseases and conditions complicating pregnancy, childbirth and the puerperium: Secondary | ICD-10-CM | POA: Diagnosis not present

## 2014-10-02 DIAGNOSIS — R05 Cough: Secondary | ICD-10-CM | POA: Diagnosis present

## 2014-10-02 DIAGNOSIS — Z87891 Personal history of nicotine dependence: Secondary | ICD-10-CM | POA: Diagnosis not present

## 2014-10-02 DIAGNOSIS — Z3A01 Less than 8 weeks gestation of pregnancy: Secondary | ICD-10-CM | POA: Diagnosis not present

## 2014-10-02 LAB — URINE MICROSCOPIC-ADD ON

## 2014-10-02 LAB — URINALYSIS, ROUTINE W REFLEX MICROSCOPIC
BILIRUBIN URINE: NEGATIVE
GLUCOSE, UA: NEGATIVE mg/dL
HGB URINE DIPSTICK: NEGATIVE
KETONES UR: NEGATIVE mg/dL
Nitrite: NEGATIVE
PH: 6.5 (ref 5.0–8.0)
Protein, ur: NEGATIVE mg/dL
Specific Gravity, Urine: 1.015 (ref 1.005–1.030)
Urobilinogen, UA: 0.2 mg/dL (ref 0.0–1.0)

## 2014-10-02 LAB — POCT PREGNANCY, URINE: PREG TEST UR: POSITIVE — AB

## 2014-10-02 MED ORDER — PROMETHAZINE HCL 25 MG PO TABS: 25.0000 mg | ORAL_TABLET | Freq: Four times a day (QID) | ORAL | Status: DC | PRN

## 2014-10-02 MED ORDER — BENZONATATE 100 MG PO CAPS: 100.0000 mg | ORAL_CAPSULE | Freq: Three times a day (TID) | ORAL | Status: DC | PRN

## 2014-10-02 NOTE — MAU Provider Note (Signed)
History    Tara Sanchez is a 24y.o. [id number] at 6.6wks who presents, unannounced, for cough x 4 days and fever x 2 days.  Patient states that she is coughing up "white foam."  Patient reports nausea, vomiting, but denies diarrhea, and constipation. Patient further denies chills, allergies, and pain.  Patient reports taking 1 regular strength tylenol at 7am and 2 sudafed tablets at hs x 2 nights. Patient consuming regular diet and states that she has "white foam" when she feels nauseous.  Patient denies being around others who are sick.  Patient unsure if she has had a influenza vaccination.     There are no active problems to display for this patient.   Chief Complaint  Patient presents with  . URI  . Cough   HPI  OB History    Gravida Para Term Preterm AB TAB SAB Ectopic Multiple Living   4 1 0 1 2 2    0 1      Past Medical History  Diagnosis Date  . Infection     UTI    Past Surgical History  Procedure Laterality Date  . Therapeutic abortion      Family History  Problem Relation Age of Onset  . Asthma Neg Hx   . Cancer Neg Hx   . Diabetes Neg Hx   . Heart disease Neg Hx   . Hypertension Neg Hx   . Stroke Neg Hx     History  Substance Use Topics  . Smoking status: Former Smoker -- 2 years    Types: Cigars  . Smokeless tobacco: Never Used  . Alcohol Use: No    Allergies: No Known Allergies  Prescriptions prior to admission  Medication Sig Dispense Refill Last Dose  . acetaminophen (TYLENOL) 325 MG tablet Take 325 mg by mouth every 6 (six) hours as needed for mild pain, moderate pain or headache.   10/02/2014 at Unknown time  . Prenatal Vit-Fe Fumarate-FA (PRENATAL MULTIVITAMIN) TABS tablet Take 1 tablet by mouth daily at 12 noon.   10/02/2014 at Unknown time    Review of Systems  Constitutional: Positive for fever (Yesterday only: 100.0) and chills (Once 4 days ago). Negative for weight loss.  Respiratory: Positive for cough and sputum production (."white  foam"). Negative for hemoptysis, shortness of breath and wheezing.   Gastrointestinal: Positive for nausea and vomiting. Negative for diarrhea and constipation.  Genitourinary: Negative.   Musculoskeletal: Negative.   Neurological: Negative for headaches.    See HPI Above Physical Exam   Blood pressure 116/45, pulse 74, temperature 98.2 F (36.8 C), resp. rate 20, last menstrual period 07/23/2014.  Results for orders placed or performed during the hospital encounter of 10/02/14 (from the past 24 hour(s))  Urinalysis, Routine w reflex microscopic (not at [facility])     Status: Abnormal   Collection Time: 10/02/14  2:50 PM  Result Value Ref Range   Color, Urine YELLOW YELLOW   APPearance CLEAR CLEAR   Specific Gravity, Urine 1.015 1.005 - 1.030   pH 6.5 5.0 - 8.0   Glucose, UA NEGATIVE NEGATIVE mg/dL   Hgb urine dipstick NEGATIVE NEGATIVE   Bilirubin Urine NEGATIVE NEGATIVE   Ketones, ur NEGATIVE NEGATIVE mg/dL   Protein, ur NEGATIVE NEGATIVE mg/dL   Urobilinogen, UA 0.2 0.0 - 1.0 mg/dL   Nitrite NEGATIVE NEGATIVE   Leukocytes, UA TRACE (A) NEGATIVE  Urine microscopic-add on     Status: Abnormal   Collection Time: 10/02/14  2:50 PM  Result Value  Ref Range   Squamous Epithelial / LPF MANY (A) RARE   WBC, UA 11-20 <3 WBC/hpf   Bacteria, UA FEW (A) RARE  Pregnancy, urine POC     Status: Abnormal   Collection Time: 10/02/14  2:59 PM  Result Value Ref Range   Preg Test, Ur POSITIVE (A) NEGATIVE    Physical Exam  Vitals reviewed. Constitutional: She is oriented to person, place, and time. She appears well-developed and well-nourished. No distress.  HENT:  Head: Normocephalic and atraumatic.  Eyes: Pupils are equal, round, and reactive to light.  Neck: Normal range of motion.  Cardiovascular: Normal rate, regular rhythm and normal heart sounds.   Respiratory: Effort normal and breath sounds normal. No accessory muscle usage. No respiratory distress. She has no wheezes. She has no  rales.  GI: Soft. Bowel sounds are normal. There is no tenderness.  Musculoskeletal: Normal range of motion.  Neurological: She is alert and oriented to person, place, and time.  Skin: Skin is warm and dry.     ED Course  Assessment: IUP at 6.6wks Cough  Plan: -UA: WNL -Exam Normal -Discussed common cold vs acute cough -Rx for tessalon perles -Rx for phenergan -Instructions on who and when to call for worsening symptoms -Discussed non-pharmacologic relief measures including hydration, small meals, and rest -OOW until Thursday -Discharged to home in stable condition -Keep appt as scheduled: 10/19/2014  [facility], Lisbet Busker LYNN CNM, MSN 10/02/2014 3:40 PM

## 2014-10-02 NOTE — Discharge Instructions (Signed)
First Trimester of Pregnancy The first trimester of pregnancy is from week 1 until the end of week 12 (months 1 through 3). A week after a sperm fertilizes an egg, the egg will implant on the wall of the uterus. This embryo will begin to develop into a baby. Genes from you and your partner are forming the baby. The female genes determine whether the baby is a boy or a girl. At 6-8 weeks, the eyes and face are formed, and the heartbeat can be seen on ultrasound. At the end of 12 weeks, all the baby's organs are formed.  Now that you are pregnant, you will want to do everything you can to have a healthy baby. Two of the most important things are to get good prenatal care and to follow your health care provider's instructions. Prenatal care is all the medical care you receive before the baby's birth. This care will help prevent, find, and treat any problems during the pregnancy and childbirth.  BODY CHANGES Your body goes through many changes during pregnancy. The changes vary from woman to woman.   You may gain or lose a couple of pounds at first.  You may feel sick to your stomach (nauseous) and throw up (vomit). If the vomiting is uncontrollable, call your health care provider.  You may tire easily.  You may develop headaches that can be relieved by medicines approved by your health care provider.  You may urinate more often. Painful urination may mean you have a bladder infection.  You may develop heartburn as a result of your pregnancy.  You may develop constipation because certain hormones are causing the muscles that push waste through your intestines to slow down.  You may develop hemorrhoids or swollen, bulging veins (varicose veins).  Your breasts may begin to grow larger and become tender. Your nipples may stick out more, and the tissue that surrounds them (areola) may become darker.  Your gums may bleed and may be sensitive to brushing and flossing.  Dark spots or blotches  (chloasma, mask of pregnancy) may develop on your face. This will likely fade after the baby is born.  Your menstrual periods will stop.  You may have a loss of appetite.  You may develop cravings for certain kinds of food.  You may have changes in your emotions from day to day, such as being excited to be pregnant or being concerned that something may go wrong with the pregnancy and baby.  You may have more vivid and strange dreams.  You may have changes in your hair. These can include thickening of your hair, rapid growth, and changes in texture. Some women also have hair loss during or after pregnancy, or hair that feels dry or thin. Your hair will most likely return to normal after your baby is born.  WHAT TO EXPECT AT YOUR PRENATAL VISITS During a routine prenatal visit:  You will be weighed to make sure you and the baby are growing normally.  Your blood pressure will be taken.  Your abdomen will be measured to track your baby's growth.  The fetal heartbeat will be listened to starting around week 10 or 12 of your pregnancy.  Test results from any previous visits will be discussed. Your health care provider may ask you:  How you are feeling.  If you are feeling the baby move.  If you have had any abnormal symptoms, such as leaking fluid, bleeding, severe headaches, or abdominal cramping.  If you have any questions.  Other tests that may be performed during your first trimester include:  Blood tests to find your blood type and to check for the presence of any previous infections. They will also be used to check for low iron levels (anemia) and Rh antibodies. Later in the pregnancy, blood tests for diabetes will be done along with other tests if problems develop.  Urine tests to check for infections, diabetes, or protein in the urine.  An ultrasound to confirm the proper growth and development of the baby.  An amniocentesis to check for possible genetic problems.  Fetal  screens for spina bifida and Down syndrome.  You may need other tests to make sure you and the baby are doing well.  HOME CARE INSTRUCTIONS  Medicines  Follow your health care provider's instructions regarding medicine use. Specific medicines may be either safe or unsafe to take during pregnancy.  Take your prenatal vitamins as directed.  If you develop constipation, try taking a stool softener if your health care provider approves. Diet  Eat regular, well-balanced meals. Choose a variety of foods, such as meat or vegetable-based protein, fish, milk and low-fat dairy products, vegetables, fruits, and whole grain breads and cereals. Your health care provider will help you determine the amount of weight gain that is right for you.  Avoid raw meat and uncooked cheese. These carry germs that can cause birth defects in the baby.  Eating four or five small meals rather than three large meals a day may help relieve nausea and vomiting. If you start to feel nauseous, eating a few soda crackers can be helpful. Drinking liquids between meals instead of during meals also seems to help nausea and vomiting.  If you develop constipation, eat more high-fiber foods, such as fresh vegetables or fruit and whole grains. Drink enough fluids to keep your urine clear or pale yellow. Activity and Exercise  Exercise only as directed by your health care provider. Exercising will help you:  Control your weight.  Stay in shape.  Be prepared for labor and delivery.  Experiencing pain or cramping in the lower abdomen or low back is a good sign that you should stop exercising. Check with your health care provider before continuing normal exercises.  Try to avoid standing for long periods of time. Move your legs often if you must stand in one place for a long time.  Avoid heavy lifting.  Wear low-heeled shoes, and practice good posture.  You may continue to have sex unless your health care provider directs  you otherwise. Relief of Pain or Discomfort  Wear a good support bra for breast tenderness.   Take warm sitz baths to soothe any pain or discomfort caused by hemorrhoids. Use hemorrhoid cream if your health care provider approves.   Rest with your legs elevated if you have leg cramps or low back pain.  If you develop varicose veins in your legs, wear support hose. Elevate your feet for 15 minutes, 3-4 times a day. Limit salt in your diet. Prenatal Care  Schedule your prenatal visits by the twelfth week of pregnancy. They are usually scheduled monthly at first, then more often in the last 2 months before delivery.  Write down your questions. Take them to your prenatal visits.  Keep all your prenatal visits as directed by your health care provider. Safety  Wear your seat belt at all times when driving.  Make a list of emergency phone numbers, including numbers for family, friends, the hospital, and police and fire  departments. General Tips  Ask your health care provider for a referral to a local prenatal education class. Begin classes no later than at the beginning of month 6 of your pregnancy.  Ask for help if you have counseling or nutritional needs during pregnancy. Your health care provider can offer advice or refer you to specialists for help with various needs.  Do not use hot tubs, steam rooms, or saunas.  Do not douche or use tampons or scented sanitary pads.  Do not cross your legs for long periods of time.  Avoid cat litter boxes and soil used by cats. These carry germs that can cause birth defects in the baby and possibly loss of the fetus by miscarriage or stillbirth.  Avoid all smoking, herbs, alcohol, and medicines not prescribed by your health care provider. Chemicals in these affect the formation and growth of the baby.  Schedule a dentist appointment. At home, brush your teeth with a soft toothbrush and be gentle when you floss. SEEK MEDICAL CARE IF:   You  have dizziness.  You have mild pelvic cramps, pelvic pressure, or nagging pain in the abdominal area.  You have persistent nausea, vomiting, or diarrhea.  You have a bad smelling vaginal discharge.  You have pain with urination.  You notice increased swelling in your face, hands, legs, or ankles. SEEK IMMEDIATE MEDICAL CARE IF:   You have a fever.  You are leaking fluid from your vagina.  You have spotting or bleeding from your vagina.  You have severe abdominal cramping or pain.  You have rapid weight gain or loss.  You vomit blood or material that looks like coffee grounds.  You are exposed to [place] measles and have never had them.  You are exposed to fifth disease or chickenpox.  You develop a severe headache.  You have shortness of breath.  You have any kind of trauma, such as from a fall or a car accident. Document Released: 04/01/2001 Document Revised: 08/22/2013 Document Reviewed: 02/15/2013 [facility] Patient Information 2015 [place], [place]. This information is not intended to replace advice given to you by your health care provider. Make sure you discuss any questions you have with your health care provider. Cough, Adult  A cough is a reflex that helps clear your throat and airways. It can help heal the body or may be a reaction to an irritated airway. A cough may only last 2 or 3 weeks (acute) or may last more than 8 weeks (chronic).  CAUSES Acute cough:  Viral or bacterial infections. Chronic cough:  Infections.  Allergies.  Asthma.  Post-nasal drip.  Smoking.  Heartburn or acid reflux.  Some medicines.  Chronic lung problems (COPD).  Cancer. SYMPTOMS   Cough.  Fever.  Chest pain.  Increased breathing rate.  High-pitched whistling sound when breathing (wheezing).  Colored mucus that you cough up (sputum). TREATMENT   A bacterial cough may be treated with antibiotic medicine.  A viral cough must run its course and will not  respond to antibiotics.  Your caregiver may recommend other treatments if you have a chronic cough. HOME CARE INSTRUCTIONS   Only take over-the-counter or prescription medicines for pain, discomfort, or fever as directed by your caregiver. Use cough suppressants only as directed by your caregiver.  Use a cold steam vaporizer or humidifier in your bedroom or home to help loosen secretions.  Sleep in a semi-upright position if your cough is worse at night.  Rest as needed.  Stop smoking if you  smoke. SEEK IMMEDIATE MEDICAL CARE IF:   You have pus in your sputum.  Your cough starts to worsen.  You cannot control your cough with suppressants and are losing sleep.  You begin coughing up blood.  You have difficulty breathing.  You develop pain which is getting worse or is uncontrolled with medicine.  You have a fever. MAKE SURE YOU:   Understand these instructions.  Will watch your condition.  Will get help right away if you are not doing well or get worse. Document Released: 10/04/2010 Document Revised: 06/30/2011 Document Reviewed: 10/04/2010 [facility] Patient Information 2015 [place], [place]. This information is not intended to replace advice given to you by your health care provider. Make sure you discuss any questions you have with your health care provider.

## 2014-10-02 NOTE — MAU Note (Addendum)
C/o cold and cough for past 4 days; no else at home is sick; tried tylenol and sudafed without any relief,

## 2014-11-08 ENCOUNTER — Ambulatory Visit (INDEPENDENT_AMBULATORY_CARE_PROVIDER_SITE_OTHER): Payer: BLUE CROSS/BLUE SHIELD | Admitting: Physician Assistant

## 2014-11-08 VITALS — BP 108/60 | HR 65 | Temp 97.2°F | Resp 14 | Ht 68.0 in | Wt 169.0 lb

## 2014-11-08 DIAGNOSIS — R309 Painful micturition, unspecified: Secondary | ICD-10-CM | POA: Diagnosis not present

## 2014-11-08 DIAGNOSIS — N390 Urinary tract infection, site not specified: Secondary | ICD-10-CM

## 2014-11-08 LAB — POCT URINALYSIS DIPSTICK
BILIRUBIN UA: NEGATIVE
Glucose, UA: NEGATIVE
Ketones, UA: NEGATIVE
Nitrite, UA: POSITIVE
Spec Grav, UA: 1.02
Urobilinogen, UA: 0.2
pH, UA: 6.5

## 2014-11-08 LAB — POCT UA - MICROSCOPIC ONLY
CASTS, UR, LPF, POC: NEGATIVE
Crystals, Ur, HPF, POC: NEGATIVE
Yeast, UA: NEGATIVE

## 2014-11-08 MED ORDER — NITROFURANTOIN MONOHYD MACRO 100 MG PO CAPS: 100.0000 mg | ORAL_CAPSULE | Freq: Two times a day (BID) | ORAL | Status: DC

## 2014-11-08 NOTE — Patient Instructions (Signed)

## 2014-11-08 NOTE — Progress Notes (Signed)
11/08/2014 at 1:35 PM  Tara Pasty Sanchez / DOB: Sep 19, 1990 / MRN: [id number]  The patient  does not have a problem list on file.  SUBJECTIVE  Chief complaint: painful urination  HPI Comments: Tara Sanchez is a 3 months pregnant 24 y.o. female here today for dysuria.  She is scheduled to have an abortion this Friday in [place].    Dysuria  Episode onset: 2 weeks ago. The problem occurs every urination. The problem has been unchanged. The quality of the pain is described as stabbing. The pain is at a severity of 5/10. There has been no fever. She is sexually active. There is no history of pyelonephritis. Associated symptoms include frequency and urgency. Pertinent negatives include no chills, discharge, flank pain, hematuria, hesitancy, nausea, sweats or vomiting. She has tried increased fluids (azo) for the symptoms. The treatment provided moderate relief.     She  has a past medical history of Infection.    Medications reviewed and updated by myself where necessary, and exist elsewhere in the encounter.   [name] has No Known Allergies. She  reports that she has quit smoking. Her smoking use included Cigars. She has never used smokeless tobacco. She reports that she does not drink alcohol or use illicit drugs. She  reports that she currently engages in sexual activity. She reports using the following method of birth control/protection: None. The patient  has past surgical history that includes Therapeutic abortion.  Her family history is negative for Asthma, Cancer, Diabetes, Heart disease, Hypertension, and Stroke.  Review of Systems  Constitutional: Negative for chills.  Gastrointestinal: Negative for nausea and vomiting.  Genitourinary: Positive for dysuria, urgency and frequency. Negative for hesitancy, hematuria and flank pain.    OBJECTIVE  Her  height is 5\' 8"  (1.727 m) and weight is 169 lb (76.658 kg). Her oral temperature is 97.2 F (36.2 C). Her blood pressure is 108/60 and  her pulse is 65. Her respiration is 14 and oxygen saturation is 98%.  The patient's body mass index is 25.7 kg/(m^2).  Physical Exam  Constitutional: She is oriented to person, place, and time. Vital signs are normal. She does not appear ill.  Respiratory: Effort normal and breath sounds normal.  GI: Soft. Bowel sounds are normal. She exhibits no distension and no mass. There is no tenderness. There is no rebound, no guarding and no CVA tenderness.  Musculoskeletal: Normal range of motion.  Neurological: She is alert and oriented to person, place, and time.  Skin: Skin is warm and dry.  Psychiatric: She has a normal mood and affect.    Results for orders placed or performed in visit on 11/08/14 (from the past 24 hour(s))  POCT UA - Microscopic Only     Status: Abnormal   Collection Time: 11/08/14  1:33 PM  Result Value Ref Range   WBC, Ur, HPF, POC TNTC    RBC, urine, microscopic 1-4    Bacteria, U Microscopic 4+    Mucus, UA trace    Epithelial cells, urine per micros 3-12    Crystals, Ur, HPF, POC negative    Casts, Ur, LPF, POC negative    Yeast, UA negative   POCT urinalysis dipstick     Status: Abnormal   Collection Time: 11/08/14  1:33 PM  Result Value Ref Range   Color, UA yellow    Clarity, UA cloudy    Glucose, UA negative    Bilirubin, UA negative    Ketones, UA negative  Spec Grav, UA 1.020    Blood, UA trace-lysed    pH, UA 6.5    Protein, UA trace    Urobilinogen, UA 0.2    Nitrite, UA positive    Leukocytes, UA large (3+) (A) Negative    ASSESSMENT & PLAN  Tara Sanchez was seen today for painful urination.  Diagnoses and all orders for this visit:  Painful urination Orders: -     POCT UA - Microscopic Only -     POCT urinalysis dipstick  Complicated UTI (urinary tract infection): Patient with Nitrite positive urine.  She is pregnant, however does not have fever, chills, nausea, diaphoresis, and flank pain.  Will treat outpatient with below and await  culture results.   Orders: -     nitrofurantoin, macrocrystal-monohydrate, (MACROBID) 100 MG capsule; Take 1 capsule (100 mg total) by mouth 2 (two) times daily. -     Urine culture    The patient was advised to call or come back to clinic if she does not see an improvement in symptoms, or worsens with the above plan.   Deliah Boston, MHS, PA-C [facility] : [facility] 11/08/2014 1:35 PM

## 2014-11-11 LAB — URINE CULTURE: Colony Count: >=100000

## 2014-12-01 LAB — OB RESULTS CONSOLE GBS: STREP GROUP B AG: NEGATIVE

## 2015-01-09 ENCOUNTER — Inpatient Hospital Stay (HOSPITAL_COMMUNITY)
Admission: AD | Admit: 2015-01-09 | Discharge: 2015-01-09 | Disposition: A | Discharge status: Home / Self Care | Payer: BLUE CROSS/BLUE SHIELD | Source: Ambulatory Visit | Attending: Obstetrics & Gynecology | Admitting: Obstetrics & Gynecology

## 2015-01-09 ENCOUNTER — Encounter (HOSPITAL_COMMUNITY): Payer: Self-pay | Admitting: *Deleted

## 2015-01-09 DIAGNOSIS — O368120 Decreased fetal movements, second trimester, not applicable or unspecified: Secondary | ICD-10-CM | POA: Insufficient documentation

## 2015-01-09 DIAGNOSIS — Z8751 Personal history of pre-term labor: Secondary | ICD-10-CM | POA: Diagnosis present

## 2015-01-09 DIAGNOSIS — O98312 Other infections with a predominantly sexual mode of transmission complicating pregnancy, second trimester: Secondary | ICD-10-CM | POA: Insufficient documentation

## 2015-01-09 DIAGNOSIS — Z3A21 21 weeks gestation of pregnancy: Secondary | ICD-10-CM | POA: Insufficient documentation

## 2015-01-09 DIAGNOSIS — Z2839 Other underimmunization status: Secondary | ICD-10-CM | POA: Diagnosis present

## 2015-01-09 DIAGNOSIS — Z87891 Personal history of nicotine dependence: Secondary | ICD-10-CM | POA: Diagnosis not present

## 2015-01-09 DIAGNOSIS — A5901 Trichomonal vulvovaginitis: Secondary | ICD-10-CM | POA: Diagnosis not present

## 2015-01-09 DIAGNOSIS — Z8759 Personal history of other complications of pregnancy, childbirth and the puerperium: Secondary | ICD-10-CM

## 2015-01-09 DIAGNOSIS — Z889 Allergy status to unspecified drugs, medicaments and biological substances status: Secondary | ICD-10-CM

## 2015-01-09 DIAGNOSIS — Z283 Underimmunization status: Secondary | ICD-10-CM

## 2015-01-09 DIAGNOSIS — O09899 Supervision of other high risk pregnancies, unspecified trimester: Secondary | ICD-10-CM | POA: Diagnosis present

## 2015-01-09 DIAGNOSIS — A599 Trichomoniasis, unspecified: Secondary | ICD-10-CM

## 2015-01-09 LAB — URINALYSIS, ROUTINE W REFLEX MICROSCOPIC
Bilirubin Urine: NEGATIVE
Glucose, UA: NEGATIVE mg/dL
Hgb urine dipstick: NEGATIVE
KETONES UR: NEGATIVE mg/dL
NITRITE: NEGATIVE
PH: 6 (ref 5.0–8.0)
Protein, ur: NEGATIVE mg/dL
Urobilinogen, UA: 0.2 mg/dL (ref 0.0–1.0)

## 2015-01-09 LAB — URINE MICROSCOPIC-ADD ON

## 2015-01-09 LAB — WET PREP, GENITAL
Clue Cells Wet Prep HPF POC: NONE SEEN
Yeast Wet Prep HPF POC: NONE SEEN

## 2015-01-09 MED ORDER — METRONIDAZOLE 500 MG PO TABS: 2000.0000 mg | ORAL_TABLET | Freq: Once | ORAL | Status: DC

## 2015-01-09 NOTE — Discharge Instructions (Signed)

## 2015-01-09 NOTE — MAU Provider Note (Signed)
History   24 yo [id number] at 21 weeks presented unannounced c/o decreased FM today and vaginal d/c/vulvar irritation, unresponsive to Monistat.  Also c/o "hair bump" on vulva.    Patient Active Problem List   Diagnosis Date Noted  . Preterm delivery--hx 31 week delivery 01/09/2015  . H/O 2 abortions 01/09/2015  . Maternal varicella, non-immune 01/09/2015  . Allergy history, drug--Phergan, Beckie Salts 01/09/2015    Chief Complaint  Patient presents with  . Decreased Fetal Movement  . Vaginal Discharge   HPI:  See above  OB History    Gravida Para Term Preterm AB TAB SAB Ectopic Multiple Living   4 1 0 0 1      Past Medical History  Diagnosis Date  . Infection     UTI    Past Surgical History  Procedure Laterality Date  . Therapeutic abortion      Family History  Problem Relation Age of Onset  . Asthma Neg Hx   . Cancer Neg Hx   . Diabetes Neg Hx   . Heart disease Neg Hx   . Hypertension Neg Hx   . Stroke Neg Hx     Social History  Substance Use Topics  . Smoking status: Former Smoker -- 2 years    Types: Cigars  . Smokeless tobacco: Never Used  . Alcohol Use: No    Allergies: No Known Allergies  Prescriptions prior to admission  Medication Sig Dispense Refill Last Dose  . acetaminophen (TYLENOL) 325 MG tablet Take 325 mg by mouth every 6 (six) hours as needed for mild pain, moderate pain or headache.   Past Month at Unknown time  . Prenatal Vit-Fe Fumarate-FA (PRENATAL MULTIVITAMIN) TABS tablet Take 1 tablet by mouth daily at 12 noon.   01/08/2015 at Unknown time  . nitrofurantoin, macrocrystal-monohydrate, (MACROBID) 100 MG capsule Take 1 capsule (100 mg total) by mouth 2 (two) times daily. 20 capsule 0     ROS:  Decreased FM, vaginal d/c, vulvar itching Physical Exam   Blood pressure 114/60, pulse 76, temperature 98.1 F (36.7 C), temperature source Oral, resp. rate 18, weight 80.65 kg (177 lb 12.8 oz), last menstrual period  07/23/2014.    Physical Exam  In NAD Chest clear Heart RRR without murmur Abd gravid, NT Pelvic--thin, frothy d/c in vault, cervix closed/long.  Small indurated firm hair follicle on right side of mons--no erythema or cellulitis surrounding, no pustular head, NT.  Ext WNL  FHR 156  ED Course  Assessment: IUP at 21 weeks Decreased perception of FM, but good FM by US observation Vaginal d/c Indurated hair follicle  Plan: Patient now reassured regarding FM Wet prep   Nigel Bridgeman CNM, MSN 01/09/2015 5:58 PM   Addendum:  Results for orders placed or performed during the hospital encounter of 01/09/15 (from the past 24 hour(s))  Urinalysis, Routine w reflex microscopic (not at [facility])     Status: Abnormal   Collection Time: 01/09/15  4:55 PM  Result Value Ref Range   Color, Urine YELLOW YELLOW   APPearance CLEAR CLEAR   Specific Gravity, Urine >1.030 (H) 1.005 - 1.030   pH 6.0 5.0 - 8.0   Glucose, UA NEGATIVE NEGATIVE mg/dL   Hgb urine dipstick NEGATIVE NEGATIVE   Bilirubin Urine NEGATIVE NEGATIVE   Ketones, ur NEGATIVE NEGATIVE mg/dL   Protein, ur NEGATIVE NEGATIVE mg/dL   Urobilinogen, UA 0.2 0.0 - 1.0 mg/dL   Nitrite NEGATIVE NEGATIVE   Leukocytes, UA  MODERATE (A) NEGATIVE  Urine microscopic-add on     Status: Abnormal   Collection Time: 01/09/15  4:55 PM  Result Value Ref Range   Squamous Epithelial / LPF MANY (A) RARE   WBC, UA 7-10 <3 WBC/hpf   RBC / HPF 11-20 <3 RBC/hpf   Bacteria, UA MANY (A) RARE   Urine-Other TRICHOMONAS PRESENT     Impression: IUP at 21 weeks Trichomoniasis Appropriate FM Hx previous PTD--declines 17P  Plan: D/C home with instructions for treatment of trich and prevention of reinfection. Declines partner treatment. Reviewed normal expectations for FM. Keep scheduled appt at [facility] or call prn. Rx MTZ 2 gm dose to patient's pharmacy. GC/chlamydia as add-on to urine testing.  Nigel Bridgeman, CNM 01/09/15 6:20p

## 2015-01-09 NOTE — MAU Note (Signed)
No fetal movement past 2 days. Has had vag itching and heavy d/c, tried monistat 3 day- no relief.

## 2015-01-22 ENCOUNTER — Encounter (HOSPITAL_COMMUNITY): Payer: Self-pay

## 2015-01-22 ENCOUNTER — Inpatient Hospital Stay (HOSPITAL_COMMUNITY)
Admission: AD | Admit: 2015-01-22 | Discharge: 2015-01-22 | Disposition: A | Discharge status: Home / Self Care | Payer: BLUE CROSS/BLUE SHIELD | Source: Ambulatory Visit | Attending: Obstetrics and Gynecology | Admitting: Obstetrics and Gynecology

## 2015-01-22 DIAGNOSIS — R509 Fever, unspecified: Secondary | ICD-10-CM

## 2015-01-22 DIAGNOSIS — Z87891 Personal history of nicotine dependence: Secondary | ICD-10-CM | POA: Diagnosis not present

## 2015-01-22 DIAGNOSIS — Z3A22 22 weeks gestation of pregnancy: Secondary | ICD-10-CM | POA: Insufficient documentation

## 2015-01-22 DIAGNOSIS — O26892 Other specified pregnancy related conditions, second trimester: Secondary | ICD-10-CM | POA: Diagnosis not present

## 2015-01-22 LAB — URINALYSIS, ROUTINE W REFLEX MICROSCOPIC
BILIRUBIN URINE: NEGATIVE
Glucose, UA: NEGATIVE mg/dL
HGB URINE DIPSTICK: NEGATIVE
KETONES UR: NEGATIVE mg/dL
NITRITE: NEGATIVE
PROTEIN: NEGATIVE mg/dL
SPECIFIC GRAVITY, URINE: 1.02 (ref 1.005–1.030)
Urobilinogen, UA: 0.2 mg/dL (ref 0.0–1.0)
pH: 6 (ref 5.0–8.0)

## 2015-01-22 LAB — RAPID URINE DRUG SCREEN, HOSP PERFORMED
AMPHETAMINES: NOT DETECTED
BENZODIAZEPINES: NOT DETECTED
Barbiturates: NOT DETECTED
COCAINE: NOT DETECTED
OPIATES: NOT DETECTED
Tetrahydrocannabinol: NOT DETECTED

## 2015-01-22 LAB — URINE MICROSCOPIC-ADD ON

## 2015-01-22 LAB — CBC
HCT: 39 % (ref 36.0–46.0)
HEMOGLOBIN: 13.2 g/dL (ref 12.0–15.0)
MCH: 30.5 pg (ref 26.0–34.0)
MCHC: 33.8 g/dL (ref 30.0–36.0)
MCV: 90.1 fL (ref 78.0–100.0)
PLATELETS: 201 10*3/uL (ref 150–400)
RBC: 4.33 MIL/uL (ref 3.87–5.11)
RDW: 13.3 % (ref 11.5–15.5)
WBC: 12.1 10*3/uL — AB (ref 4.0–10.5)

## 2015-01-22 MED ORDER — ACETAMINOPHEN 500 MG PO TABS: 1000.0000 mg | ORAL_TABLET | Freq: Four times a day (QID) | ORAL | Status: DC | PRN

## 2015-01-22 NOTE — Progress Notes (Signed)
Notified of pt arrival in MAU and complaint. Will come see pt 

## 2015-01-22 NOTE — MAU Provider Note (Signed)
History    Tara Sanchez is a 24y.o. [id number] at 22.6wks who presents, unannounced, for fever.  Patient states she had a fever of 103.0 this morning around 0800 and took tylenol , then left work to rest.  Patient states she slept for 6 hours and reports no issues with sleeping at night.  Patient states she has had a fever that has been "up and down for days."  Patient states she is taking tylenol every 5-6 hours, but feels it is not working.  Patient states she completed flagyl and that it was done a while ago.  Patient states she has an appt on Thursday.  Patient reports feelings of chills, but no nausea or vomiting, diarrhea or constipation, cough or congestion.   Patient denies history of allergies or sinus issues. Patient reports active fetus and denies contractions, back pain, VB, and LoF.   Patient Active Problem List   Diagnosis Date Noted  . Preterm delivery--hx 31 week delivery 01/09/2015  . H/O 2 abortions 01/09/2015  . Maternal varicella, non-immune 01/09/2015  . Allergy history, drug--Phergan, Tessalon Pearles 01/09/2015    No chief complaint on file.  HPI  OB History    Gravida Para Term Preterm AB TAB SAB Ectopic Multiple Living   4 1 0 0 1      Past Medical History  Diagnosis Date  . Infection     UTI    Past Surgical History  Procedure Laterality Date  . Therapeutic abortion      Family History  Problem Relation Age of Onset  . Asthma Neg Hx   . Cancer Neg Hx   . Diabetes Neg Hx   . Heart disease Neg Hx   . Hypertension Neg Hx   . Stroke Neg Hx     Social History  Substance Use Topics  . Smoking status: Former Smoker -- 2 years    Types: Cigars  . Smokeless tobacco: Never Used  . Alcohol Use: No    Allergies:  Allergies  Allergen Reactions  . Macrobid [Nitrofurantoin] Anaphylaxis    Prescriptions prior to admission  Medication Sig Dispense Refill Last Dose  . acetaminophen (TYLENOL) 325 MG tablet Take 325 mg by mouth every 6 (six)  hours as needed for mild pain, moderate pain or headache.   01/22/2015 at Unknown time  . Prenatal Vit-Fe Fumarate-FA (PRENATAL MULTIVITAMIN) TABS tablet Take 1 tablet by mouth daily at 12 noon.   01/21/2015 at Unknown time  . metroNIDAZOLE (FLAGYL) 500 MG tablet Take 4 tablets (2,000 mg total) by mouth once. (Patient not taking: Reported on 01/22/2015) 4 tablet 0     ROS  See HPI Above Physical Exam   Blood pressure 111/67, pulse 87, temperature 99.7 F (37.6 C), temperature source Oral, resp. rate 16, height  (1.702 m), weight 81.194 kg (179 lb), last menstrual period 07/23/2014, SpO2 100 %.  Results for orders placed or performed during the hospital encounter of 01/22/15 (from the past 24 hour(s))  Urinalysis, Routine w reflex microscopic (not at [facility])     Status: Abnormal   Collection Time: 01/22/15  7:29 PM  Result Value Ref Range   Color, Urine YELLOW YELLOW   APPearance CLEAR CLEAR   Specific Gravity, Urine 1.020 1.005 - 1.030   pH 6.0 5.0 - 8.0   Glucose, UA NEGATIVE NEGATIVE mg/dL   Hgb urine dipstick NEGATIVE NEGATIVE   Bilirubin Urine NEGATIVE NEGATIVE   Ketones, ur NEGATIVE NEGATIVE mg/dL  Protein, ur NEGATIVE NEGATIVE mg/dL   Urobilinogen, UA 0.2 0.0 - 1.0 mg/dL   Nitrite NEGATIVE NEGATIVE   Leukocytes, UA SMALL (A) NEGATIVE  Urine microscopic-add on     Status: Abnormal   Collection Time: 01/22/15  7:29 PM  Result Value Ref Range   Squamous Epithelial / LPF FEW (A) RARE   WBC, UA 3-6 <3 WBC/hpf   RBC / HPF 3-6 <3 RBC/hpf   Bacteria, UA FEW (A) RARE  CBC     Status: Abnormal   Collection Time: 01/22/15  8:10 PM  Result Value Ref Range   WBC 12.1 (H) 4.0 - 10.5 K/uL   RBC 4.33 3.87 - 5.11 MIL/uL   Hemoglobin 13.2 12.0 - 15.0 g/dL   HCT 16.1 09.6 - 04.5 %   MCV 90.1 78.0 - 100.0 fL   MCH 30.5 26.0 - 34.0 pg   MCHC 33.8 30.0 - 36.0 g/dL   RDW 40.9 81.1 - 91.4 %   Platelets 201 150 - 400 K/uL    Physical Exam  Constitutional: She appears well-developed and  well-nourished.  HENT:  Head: Normocephalic and atraumatic.  Eyes: EOM are normal. Pupils are equal, round, and reactive to light.  Neck: Normal range of motion.  Cardiovascular: Normal rate, regular rhythm and normal heart sounds.   Respiratory: Effort normal and breath sounds normal.  GI: Soft. Bowel sounds are normal.  Neurological: She is alert.  Skin: Skin is warm and dry.     FHR: 146 by doppler   ED Course  Assessment: IUP at 22.6wks Fever  Plan: -PE as above -Labs: CBC with Diff/UA-WNL -Discussed results and inability to diagnosis related infection/fever based on findings -Encouraged to take tylenol every 6 hours for fever -Discussed non-pharmacologic options for fever mgmt including hydration and rest -OOW tomorrow 10/4 for rest -Office to contact patient and arrange for follow up on 10/4 or 10/5 -Encouraged to call if any questions or concerns arise prior to next scheduled office visit.  -Discharged to home in stable condition -Patient requests and given RX for tylenol  Take 2 tablets Q 6 hrs PRN for fever  Xin Klawitter LYNN CNM, MSN 01/22/2015 8:55 PM

## 2015-01-22 NOTE — MAU Note (Signed)
Pt reports temp of 101-102 for the last few days. Denies nausea/vomiting/diarrhea. Denies dysuria. Denies bleeding.

## 2015-01-22 NOTE — Discharge Instructions (Signed)
Fever, Adult A fever is a temperature of 100.4 F (38 C) or above.  HOME CARE  Take fever medicine as told by your doctor. Do not  take aspirin for fever if you are younger than 22 years of age.  If you are given antibiotic medicine, take it as told. Finish the medicine even if you start to feel better.  Rest.  Drink enough fluids to keep your pee (urine) clear or pale yellow. Do not drink alcohol.  Take a bath or shower with room temperature water. Do not use ice water or alcohol sponge baths.  Wear lightweight, loose clothes. GET HELP RIGHT AWAY IF:   You are short of breath or have trouble breathing.  You are very weak.  You are dizzy or you pass out (faint).  You are very thirsty or are making little or no urine.  You have new pain.  You throw up (vomit) or have watery poop (diarrhea).  You keep throwing up or having watery poop for more than 1 to 2 days.  You have a stiff neck or light bothers your eyes.  You have a skin rash.  You have a fever or problems (symptoms) that last for more than 2 to 3 days.  You have a fever and your problems quickly get worse.  You keep throwing up the fluids you drink.  You do not feel better after 3 days.  You have new problems. MAKE SURE YOU:   Understand these instructions.  Will watch your condition.  Will get help right away if you are not doing well or get worse. Document Released: 01/15/2008 Document Revised: 06/30/2011 Document Reviewed: 02/06/2011 [facility] Patient Information 2015 [place], [place]. This information is not intended to replace advice given to you by your health care provider. Make sure you discuss any questions you have with your health care provider.  Second Trimester of Pregnancy The second trimester is from week 13 through week 28, months 4 through 6. The second trimester is often a time when you feel your best. Your body has also adjusted to being pregnant, and you begin to feel better physically.  Usually, morning sickness has lessened or quit completely, you may have more energy, and you may have an increase in appetite. The second trimester is also a time when the fetus is growing rapidly. At the end of the sixth month, the fetus is about 9 inches long and weighs about 1 pounds. You will likely begin to feel the baby move (quickening) between 18 and 20 weeks of the pregnancy. BODY CHANGES Your body goes through many changes during pregnancy. The changes vary from woman to woman.   Your weight will continue to increase. You will notice your lower abdomen bulging out.  You may begin to get stretch marks on your hips, abdomen, and breasts.  You may develop headaches that can be relieved by medicines approved by your health care provider.  You may urinate more often because the fetus is pressing on your bladder.  You may develop or continue to have heartburn as a result of your pregnancy.  You may develop constipation because certain hormones are causing the muscles that push waste through your intestines to slow down.  You may develop hemorrhoids or swollen, bulging veins (varicose veins).  You may have back pain because of the weight gain and pregnancy hormones relaxing your joints between the bones in your pelvis and as a result of a shift in weight and the muscles that support your  balance.  Your breasts will continue to grow and be tender.  Your gums may bleed and may be sensitive to brushing and flossing.  Dark spots or blotches (chloasma, mask of pregnancy) may develop on your face. This will likely fade after the baby is born.  A dark line from your belly button to the pubic area (linea nigra) may appear. This will likely fade after the baby is born.  You may have changes in your hair. These can include thickening of your hair, rapid growth, and changes in texture. Some women also have hair loss during or after pregnancy, or hair that feels dry or thin. Your hair will most  likely return to normal after your baby is born. WHAT TO EXPECT AT YOUR PRENATAL VISITS During a routine prenatal visit:  You will be weighed to make sure you and the fetus are growing normally.  Your blood pressure will be taken.  Your abdomen will be measured to track your baby's growth.  The fetal heartbeat will be listened to.  Any test results from the previous visit will be discussed. Your health care provider may ask you:  How you are feeling.  If you are feeling the baby move.  If you have had any abnormal symptoms, such as leaking fluid, bleeding, severe headaches, or abdominal cramping.  If you have any questions. Other tests that may be performed during your second trimester include:  Blood tests that check for:  Low iron levels (anemia).  Gestational diabetes (between 24 and 28 weeks).  Rh antibodies.  Urine tests to check for infections, diabetes, or protein in the urine.  An ultrasound to confirm the proper growth and development of the baby.  An amniocentesis to check for possible genetic problems.  Fetal screens for spina bifida and Down syndrome. HOME CARE INSTRUCTIONS   Avoid all smoking, herbs, alcohol, and unprescribed drugs. These chemicals affect the formation and growth of the baby.  Follow your health care provider's instructions regarding medicine use. There are medicines that are either safe or unsafe to take during pregnancy.  Exercise only as directed by your health care provider. Experiencing uterine cramps is a good sign to stop exercising.  Continue to eat regular, healthy meals.  Wear a good support bra for breast tenderness.  Do not use hot tubs, steam rooms, or saunas.  Wear your seat belt at all times when driving.  Avoid raw meat, uncooked cheese, cat litter boxes, and soil used by cats. These carry germs that can cause birth defects in the baby.  Take your prenatal vitamins.  Try taking a stool softener (if your health  care provider approves) if you develop constipation. Eat more high-fiber foods, such as fresh vegetables or fruit and whole grains. Drink plenty of fluids to keep your urine clear or pale yellow.  Take warm sitz baths to soothe any pain or discomfort caused by hemorrhoids. Use hemorrhoid cream if your health care provider approves.  If you develop varicose veins, wear support hose. Elevate your feet for 15 minutes, 3-4 times a day. Limit salt in your diet.  Avoid heavy lifting, wear low heel shoes, and practice good posture.  Rest with your legs elevated if you have leg cramps or low back pain.  Visit your dentist if you have not gone yet during your pregnancy. Use a soft toothbrush to brush your teeth and be gentle when you floss.  A sexual relationship may be continued unless your health care provider directs you otherwise.  Continue to go to all your prenatal visits as directed by your health care provider. SEEK MEDICAL CARE IF:   You have dizziness.  You have mild pelvic cramps, pelvic pressure, or nagging pain in the abdominal area.  You have persistent nausea, vomiting, or diarrhea.  You have a bad smelling vaginal discharge.  You have pain with urination. SEEK IMMEDIATE MEDICAL CARE IF:   You have a fever.  You are leaking fluid from your vagina.  You have spotting or bleeding from your vagina.  You have severe abdominal cramping or pain.  You have rapid weight gain or loss.  You have shortness of breath with chest pain.  You notice sudden or extreme swelling of your face, hands, ankles, feet, or legs.  You have not felt your baby move in over an hour.  You have severe headaches that do not go away with medicine.  You have vision changes. Document Released: 04/01/2001 Document Revised: 04/12/2013 Document Reviewed: 06/08/2012 [facility] Patient Information 2015 [place], [place]. This information is not intended to replace advice given to you by your health care  provider. Make sure you discuss any questions you have with your health care provider.

## 2015-01-25 ENCOUNTER — Inpatient Hospital Stay (HOSPITAL_COMMUNITY)
Admission: AD | Admit: 2015-01-25 | Discharge: 2015-01-29 | DRG: 781 | Disposition: A | Discharge status: Home / Self Care | Payer: BLUE CROSS/BLUE SHIELD | Source: Ambulatory Visit | Attending: Obstetrics and Gynecology | Admitting: Obstetrics and Gynecology

## 2015-01-25 ENCOUNTER — Observation Stay (HOSPITAL_COMMUNITY): Payer: BLUE CROSS/BLUE SHIELD

## 2015-01-25 ENCOUNTER — Other Ambulatory Visit: Payer: Self-pay | Admitting: Obstetrics and Gynecology

## 2015-01-25 ENCOUNTER — Encounter (HOSPITAL_COMMUNITY): Payer: Self-pay

## 2015-01-25 DIAGNOSIS — R509 Fever, unspecified: Secondary | ICD-10-CM | POA: Diagnosis present

## 2015-01-25 DIAGNOSIS — Z3A23 23 weeks gestation of pregnancy: Secondary | ICD-10-CM

## 2015-01-25 DIAGNOSIS — O98512 Other viral diseases complicating pregnancy, second trimester: Secondary | ICD-10-CM | POA: Diagnosis not present

## 2015-01-25 DIAGNOSIS — L738 Other specified follicular disorders: Secondary | ICD-10-CM | POA: Diagnosis present

## 2015-01-25 DIAGNOSIS — Z87891 Personal history of nicotine dependence: Secondary | ICD-10-CM

## 2015-01-25 DIAGNOSIS — B338 Other specified viral diseases: Secondary | ICD-10-CM | POA: Diagnosis present

## 2015-01-25 DIAGNOSIS — O212 Late vomiting of pregnancy: Secondary | ICD-10-CM | POA: Diagnosis present

## 2015-01-25 LAB — CBC WITH DIFFERENTIAL/PLATELET
BASOS PCT: 0 %
Basophils Absolute: 0 10*3/uL (ref 0.0–0.1)
Eosinophils Absolute: 0 10*3/uL (ref 0.0–0.7)
Eosinophils Relative: 0 %
HEMATOCRIT: 33.7 % — AB (ref 36.0–46.0)
Hemoglobin: 11.4 g/dL — ABNORMAL LOW (ref 12.0–15.0)
LYMPHS ABS: 1.7 10*3/uL (ref 0.7–4.0)
Lymphocytes Relative: 12 %
MCH: 30.1 pg (ref 26.0–34.0)
MCHC: 33.8 g/dL (ref 30.0–36.0)
MCV: 88.9 fL (ref 78.0–100.0)
MONOS PCT: 5 %
Monocytes Absolute: 0.7 10*3/uL (ref 0.1–1.0)
NEUTROS ABS: 11.9 10*3/uL — AB (ref 1.7–7.7)
NEUTROS PCT: 83 %
Platelets: 181 10*3/uL (ref 150–400)
RBC: 3.79 MIL/uL — AB (ref 3.87–5.11)
RDW: 13.4 % (ref 11.5–15.5)
WBC: 14.3 10*3/uL — AB (ref 4.0–10.5)

## 2015-01-25 LAB — URINALYSIS, ROUTINE W REFLEX MICROSCOPIC
Bilirubin Urine: NEGATIVE
Glucose, UA: NEGATIVE mg/dL
Ketones, ur: 15 mg/dL — AB
NITRITE: NEGATIVE
PH: 6.5 (ref 5.0–8.0)
Protein, ur: NEGATIVE mg/dL
SPECIFIC GRAVITY, URINE: 1.01 (ref 1.005–1.030)
Urobilinogen, UA: 2 mg/dL — ABNORMAL HIGH (ref 0.0–1.0)

## 2015-01-25 LAB — URINE MICROSCOPIC-ADD ON

## 2015-01-25 LAB — INFLUENZA PANEL BY PCR (TYPE A & B)
H1N1 flu by pcr: NOT DETECTED
INFLBPCR: NEGATIVE
Influenza A By PCR: NEGATIVE

## 2015-01-25 MED ORDER — DOCUSATE SODIUM 100 MG PO CAPS
100.0000 mg | ORAL_CAPSULE | Freq: Every day | ORAL | Status: DC
  Administered 2015-01-26 – 2015-01-29 (×2): 100 mg via ORAL
  Filled 2015-01-25 (×3): qty 1

## 2015-01-25 MED ORDER — ACETAMINOPHEN 325 MG PO TABS: 650.0000 mg | ORAL_TABLET | ORAL | Status: DC | PRN

## 2015-01-25 MED ORDER — ACETAMINOPHEN 325 MG PO TABS
650.0000 mg | ORAL_TABLET | Freq: Four times a day (QID) | ORAL | Status: DC | PRN
  Administered 2015-01-25: 650 mg via ORAL
  Filled 2015-01-25: qty 2

## 2015-01-25 MED ORDER — PRENATAL MULTIVITAMIN CH
1.0000 | ORAL_TABLET | Freq: Every day | ORAL | Status: DC
  Administered 2015-01-26 – 2015-01-28 (×3): 1 via ORAL
  Filled 2015-01-25 (×3): qty 1

## 2015-01-25 MED ORDER — LACTATED RINGERS IV SOLN
INTRAVENOUS | Status: DC
  Administered 2015-01-25: 150 mL/h via INTRAVENOUS
  Administered 2015-01-26 – 2015-01-29 (×7): via INTRAVENOUS

## 2015-01-25 MED ORDER — LACTATED RINGERS IV BOLUS (SEPSIS): 500.0000 mL | Freq: Once | INTRAVENOUS | Status: DC

## 2015-01-25 MED ORDER — IBUPROFEN 600 MG PO TABS
600.0000 mg | ORAL_TABLET | Freq: Four times a day (QID) | ORAL | Status: DC
  Administered 2015-01-25 – 2015-01-26 (×2): 600 mg via ORAL
  Filled 2015-01-25 (×2): qty 1

## 2015-01-25 MED ORDER — OSELTAMIVIR PHOSPHATE 75 MG PO CAPS
75.0000 mg | ORAL_CAPSULE | Freq: Two times a day (BID) | ORAL | Status: DC
  Administered 2015-01-25 – 2015-01-29 (×8): 75 mg via ORAL
  Filled 2015-01-25 (×8): qty 1

## 2015-01-25 MED ORDER — CALCIUM CARBONATE ANTACID 500 MG PO CHEW
2.0000 | CHEWABLE_TABLET | ORAL | Status: DC | PRN
  Filled 2015-01-25: qty 2

## 2015-01-25 MED ORDER — ZOLPIDEM TARTRATE 5 MG PO TABS
5.0000 mg | ORAL_TABLET | Freq: Every evening | ORAL | Status: DC | PRN
  Administered 2015-01-26 – 2015-01-29 (×3): 5 mg via ORAL
  Filled 2015-01-25 (×3): qty 1

## 2015-01-25 NOTE — MAU Note (Signed)
Pt presents to MAU from physicians office for flu like symptoms. Pt states she started having a fever and chills three days ago.

## 2015-01-25 NOTE — H&P (Signed)
24 yo [id number] at 23 2/7 weeks presented from office for evaluation of febrile illness x 3 days, with max temp to 100.6 per patient report.  Temp was 103 at office.  Denies sore throat, cough, dysuria, frequency, nausea/vomiting, or any known viral exposures.  No flu vaccine this year.  Seen in MAU 10/3 for fever to 103--was 99.7 in MAU, WBC ct 12.1, with no other clinical findings.  Home with instructions to take Tylenol ATC and to f/u with the office this week.  Patient reports she started feeling even worse in the last 24 hours, with SOB at times--feels she was wheezing during the night, but denies any wheezing now.  Hx of PTL with 2011 pregnancy--onset of spontaneous labor, baby weighed 3+4, patient age 75 at the time.  Has declined 17P several times during pregnancy--feels PTD was related to "stress".  Patient Active Problem List   Diagnosis Date Noted  . Preterm delivery--hx 31 week delivery 01/09/2015  . H/O 2 abortions 01/09/2015  . Maternal varicella, non-immune 01/09/2015  . Allergy history, drug--Phergan, Beckie Salts 01/09/2015    Chief Complaint  Patient presents with  . Fever  . Chills   HPI:  See above  OB History    Gravida Para Term Preterm AB TAB SAB Ectopic Multiple Living   4 1 0 0 1    2010--TAB 2011--SVB, 31 weeks, spontaneous labor, [facility] 2012--TAB  Past Medical History  Diagnosis Date  . Infection     UTI    Past Surgical History  Procedure Laterality Date  . Therapeutic abortion      Family History  Problem Relation Age of Onset  . Asthma Neg Hx   . Cancer Neg Hx   . Diabetes Neg Hx   . Heart disease Neg Hx   . Hypertension Neg Hx   . Stroke Neg Hx     Social History  Substance Use Topics  . Smoking status: Former Smoker -- 2 years    Types: Cigars  . Smokeless tobacco: Never Used  . Alcohol Use: No    Allergies:  Allergies  Allergen Reactions  . Macrobid [Nitrofurantoin] Anaphylaxis    Prescriptions prior to admission   Medication Sig Dispense Refill Last Dose  . acetaminophen (TYLENOL) 325 MG tablet Take 325 mg by mouth every 6 (six) hours as needed for mild pain, moderate pain or headache.   01/22/2015 at Unknown time  . acetaminophen (TYLENOL) 500 MG tablet Take 2 tablets (1,000 mg total) by mouth every 6 (six) hours as needed. 100 tablet 2   . Prenatal Vit-Fe Fumarate-FA (PRENATAL MULTIVITAMIN) TABS tablet Take 1 tablet by mouth daily at 12 noon.   01/21/2015 at Unknown time    ROS:  Fever, chills, body aches, SOB Physical Exam   Blood pressure 138/67, pulse 116, temperature 102.8 F (39.3 C), resp. rate 18, last menstrual period 07/23/2014.   Physical Exam  Appears to feel bad, with chills. Body very hot to touch Chest--clear, but shallow respirations, diminished breath sounds in lower lobes Heart RRR, with tachycardia, no murmur Abd gravid, NT No CVAT Pelvic--deferred Ext DTR 2+, negative Homan's.  FHR 180 No UCs palpated  ED Course  Assessment: IUP at 23 2/7 weeks Febrile illness  Plan: Admit to Women's Unit per consult with Dr. Estanislado Pandy IV hydration CBC, diff UA, urine culture Influenza swab Tamiflu CXR FHR q shift Continue droplet precautions   Ray Church, MSN 01/25/2015 6:17 PM

## 2015-01-25 NOTE — MAU Provider Note (Signed)
  History   24 yo [id number] at 23 2/7 weeks presented from office for evaluation of febrile illness x 3 days, with max temp to 100.6 per patient report.  Temp was 103 at office.  Denies sore throat, cough, dysuria, frequency, nausea/vomiting, or any known viral exposures.  No flu vaccine this year.  Seen in MAU 10/3 for fever to 103--was 99.7 in MAU, WBC ct 12.1, with no other clinical findings.  Home with instructions to take Tylenol ATC and to f/u with the office this week.  Patient reports she started feeling even worse in the last 24 hours, with SOB at times--feels she was wheezing during the night, but denies any wheezing now.  Hx of PTL with 2011 pregnancy--onset of spontaneous labor, baby weighed 3+4, patient age 69 at the time.  Has declined 17P several times during pregnancy--feels PTD was related to "stress".  Patient Active Problem List   Diagnosis Date Noted  . Preterm delivery--hx 31 week delivery 01/09/2015  . H/O 2 abortions 01/09/2015  . Maternal varicella, non-immune 01/09/2015  . Allergy history, drug--Phergan, Beckie Salts 01/09/2015    Chief Complaint  Patient presents with  . Fever  . Chills   HPI:  See above  OB History    Gravida Para Term Preterm AB TAB SAB Ectopic Multiple Living   4 1 0 0 1    2010--TAB 2011--SVB, 31 weeks, spontaneous labor, WHG 2012--TAB  Past Medical History  Diagnosis Date  . Infection     UTI    Past Surgical History  Procedure Laterality Date  . Therapeutic abortion      Family History  Problem Relation Age of Onset  . Asthma Neg Hx   . Cancer Neg Hx   . Diabetes Neg Hx   . Heart disease Neg Hx   . Hypertension Neg Hx   . Stroke Neg Hx     Social History  Substance Use Topics  . Smoking status: Former Smoker -- 2 years    Types: Cigars  . Smokeless tobacco: Never Used  . Alcohol Use: No    Allergies:  Allergies  Allergen Reactions  . Macrobid [Nitrofurantoin] Anaphylaxis    Prescriptions prior  to admission  Medication Sig Dispense Refill Last Dose  . acetaminophen (TYLENOL) 325 MG tablet Take 325 mg by mouth every 6 (six) hours as needed for mild pain, moderate pain or headache.   01/22/2015 at Unknown time  . acetaminophen (TYLENOL) 500 MG tablet Take 2 tablets (1,000 mg total) by mouth every 6 (six) hours as needed. 100 tablet 2   . Prenatal Vit-Fe Fumarate-FA (PRENATAL MULTIVITAMIN) TABS tablet Take 1 tablet by mouth daily at 12 noon.   01/21/2015 at Unknown time    ROS:  Fever, chills, body aches, SOB Physical Exam   Blood pressure 138/67, pulse 116, temperature 102.8 F (39.3 C), resp. rate 18, last menstrual period 07/23/2014.    Physical Exam  Appears to feel bad, with chills. Body very hot to touch Chest--clear, but shallow respirations, diminished breath sounds in lower lobes Heart RRR, with tachycardia, no murmur Abd gravid, NT No CVAT Pelvic--deferred Ext DTR 2+, negative Homan's.  FHR 180  ED Course  Assessment: IUP at 23 2/7 weeks Febrile illness  Plan: Admit to Women's Unit per consult with Dr. Estanislado Pandy IV hydration CBC, diff UA, urine culture Influenza swab Tamiflu CXR FHR q shift Droplet precautions   Ray Church, MSN 01/25/2015 6:17 PM

## 2015-01-26 LAB — CBC WITH DIFFERENTIAL/PLATELET
BASOS ABS: 0 10*3/uL (ref 0.0–0.1)
BASOS PCT: 0 %
EOS ABS: 0 10*3/uL (ref 0.0–0.7)
EOS PCT: 0 %
HCT: 35.6 % — ABNORMAL LOW (ref 36.0–46.0)
HEMOGLOBIN: 12.1 g/dL (ref 12.0–15.0)
Lymphocytes Relative: 18 %
Lymphs Abs: 1.7 10*3/uL (ref 0.7–4.0)
MCH: 30.3 pg (ref 26.0–34.0)
MCHC: 34 g/dL (ref 30.0–36.0)
MCV: 89.2 fL (ref 78.0–100.0)
Monocytes Absolute: 1.1 10*3/uL — ABNORMAL HIGH (ref 0.1–1.0)
Monocytes Relative: 11 %
NEUTROS PCT: 71 %
Neutro Abs: 6.8 10*3/uL (ref 1.7–7.7)
PLATELETS: 167 10*3/uL (ref 150–400)
RBC: 3.99 MIL/uL (ref 3.87–5.11)
RDW: 13.4 % (ref 11.5–15.5)
WBC: 9.6 10*3/uL (ref 4.0–10.5)

## 2015-01-26 MED ORDER — ACETAMINOPHEN 325 MG PO TABS
650.0000 mg | ORAL_TABLET | Freq: Four times a day (QID) | ORAL | Status: DC | PRN
  Administered 2015-01-26 – 2015-01-28 (×5): 650 mg via ORAL
  Filled 2015-01-26 (×6): qty 2

## 2015-01-26 MED ORDER — LACTATED RINGERS IV BOLUS (SEPSIS)
500.0000 mL | Freq: Once | INTRAVENOUS | Status: AC
Start: 2015-01-26 — End: 2015-01-26
  Administered 2015-01-26: 500 mL via INTRAVENOUS

## 2015-01-26 MED ORDER — SODIUM CHLORIDE 0.9 % IV SOLN
2.0000 g | INTRAVENOUS | Status: DC
  Administered 2015-01-26 – 2015-01-27 (×3): 2 g via INTRAVENOUS
  Filled 2015-01-26 (×6): qty 2000

## 2015-01-26 MED ORDER — LIDOCAINE HCL 2 % EX GEL
1.0000 "application " | CUTANEOUS | Status: DC | PRN
  Filled 2015-01-26: qty 5

## 2015-01-26 MED ORDER — IBUPROFEN 600 MG PO TABS
600.0000 mg | ORAL_TABLET | Freq: Four times a day (QID) | ORAL | Status: DC | PRN
  Administered 2015-01-26 – 2015-01-28 (×5): 600 mg via ORAL
  Filled 2015-01-26 (×6): qty 1

## 2015-01-26 NOTE — Progress Notes (Addendum)
Hospital day # 1 pregnancy at [redacted]w[redacted]d--Febrile illness.  S:  Feeling better this am--"sweated during night".  Reports significant lessening of myalgia.  Denies HA, N/V.      Denies SOB or chest pain      Reports 2 "hair bumps" on right inner labia s/p recent close perineal shaving. Tender to touch.  Was            going to report at office visit, but focus was on her fever.      Perception of contractions: None      Vaginal bleeding: None       Vaginal discharge:  None  O: BP 90/52 mmHg  Pulse 74  Temp(Src) 97.9 F (36.6 C) (Oral)  Resp 16  Ht  (1.702 m)  Wt 81.194 kg (179 lb)  BMI 28.03 kg/m2  SpO2 98%  LMP 07/23/2014 (Approximate)      Chest clear      Heart RRR without murmur      FHR 160 at 2000      Contractions:   None per patient      Uterus gravid and non-tender      Extremities: no significant edema and no signs of DVT        Perineal exam:  2 small "pimple" areas on right inner labia--non-ulcerated, no surrounding cellulitis, tender      to touch.          Labs:   CBC Latest Ref Rng 01/26/2015 01/25/2015 01/22/2015  WBC 4.0 - 10.5 K/uL 9.6 14.3(H) 12.1(H)  Hemoglobin 12.0 - 15.0 g/dL 40.9 11.4(L) 13.2  Hematocrit 36.0 - 46.0 % 35.6(L) 33.7(L) 39.0  Platelets 150 - 400 K/uL 167 181 201   UA small LE, 7-10 WBC, 15 ketones, rare bacteria Culture pending  Flu swab negative  CXR:  The heart size and mediastinal contours are within normal limits. Both lungs are clear. The visualized skeletal structures are unremarkable.  IMPRESSION: No active disease.       Meds:  . docusate sodium  100 mg Oral Daily  . lactated ringers  500 mL Intravenous Once  . oseltamivir  75 mg Oral BID  . prenatal multivitamin  1 tablet Oral Q1200  Ibuprophen given q 6 hours through night--last dose 0450  A: [redacted]w[redacted]d with febrile illness     Perineal folliculitis     Improved status   P: Continue current plan of care      Upcoming tests/treatments:               D/C isolation.     Change Motrin to prn and observe fever status.             Sitz bath for perineal tenderness             Lidocaine gel 2% to area--advised patient it may not help due to no ulceration of tissue.      MDs will follow--will consult with Dr. Sallye Ober today regarding possible d/c today.      Per previous consult with Dr. Estanislado Pandy, will plan to complete Tamiflu course after d/c.      Urine culture pending.  Nigel Bridgeman CNM, [place] 01/26/2015 7:38 AM  I saw and examined patient at about 1600 and agree with above findings, assessment and plan.  Continue with in-patient observation.  Patient reports that she works with [employer], which made me think of ruling out listeriosis.  Will do stool and blood cultures for listeriosis, then start ampicillin  IV while awaiting results. Dr. Sallye Ober.

## 2015-01-26 NOTE — Progress Notes (Signed)
Per consult with Dr. Sallye Ober: Will continue to observe until 24 hours afebrile.  May be OK for d/c tonight after 8pm. Continue Tamiflu Continue Ibuprophen prn for temp > or = 100.4  Will check on patient later today. Patient aware of plan.  Nigel Bridgeman, CNM 01/26/15 10a

## 2015-01-26 NOTE — Progress Notes (Signed)
In to update patient on plan of care per Dr. Sallye Ober.  Plan: Blood culture, specified for listeria evaluation Ampicillin 2 gm IV q 4 hours after blood culture drawn CBC in am.  Patient seems to understand rationale for the plan and is agreeable.  Nigel Bridgeman, CNM 01/26/15 6:40p

## 2015-01-26 NOTE — Progress Notes (Addendum)
Called by Irving Burton, RN, to inform me that patient needed to go to work today at 3pm--however, by the time I saw the patient, she had contacted her boss and he advised her to stay out of work to avoid exposing others.  Patient having chills again, but temp 98.2. Tylenol 650 po now. Ate small amount of lunch.  Denies N/V.  Used sitz bath with benefit--declined Lidocaine gel.  OK to use sitz bath prn.  Patient planning to stay "as long as you recommend". Will evaluate late this pm.  Temp q 2 hours.  Nigel Bridgeman, CNM 01/26/15 12:45p  I saw and examined patient at about 1600 and agree with above findings, assessment and plan.  Patient had another fever of 100.5 at 1530.  Continue with in-patient observation.  Patient reports that she works with [employer]i [employer], which made me think of ruling out listeriosis.  Will do stool and blood cultures for listeriosis, then start ampicillin IV while awaiting results. Dr. Sallye Ober.

## 2015-01-26 NOTE — Progress Notes (Signed)
Patient instructed on use of sitz bath... Demonstrated understanding.

## 2015-01-27 DIAGNOSIS — Z3A23 23 weeks gestation of pregnancy: Secondary | ICD-10-CM

## 2015-01-27 DIAGNOSIS — B338 Other specified viral diseases: Secondary | ICD-10-CM | POA: Diagnosis present

## 2015-01-27 DIAGNOSIS — O98512 Other viral diseases complicating pregnancy, second trimester: Principal | ICD-10-CM

## 2015-01-27 DIAGNOSIS — L738 Other specified follicular disorders: Secondary | ICD-10-CM | POA: Diagnosis present

## 2015-01-27 DIAGNOSIS — B349 Viral infection, unspecified: Secondary | ICD-10-CM

## 2015-01-27 DIAGNOSIS — R509 Fever, unspecified: Secondary | ICD-10-CM | POA: Diagnosis present

## 2015-01-27 DIAGNOSIS — Z87891 Personal history of nicotine dependence: Secondary | ICD-10-CM | POA: Diagnosis not present

## 2015-01-27 DIAGNOSIS — O212 Late vomiting of pregnancy: Secondary | ICD-10-CM | POA: Diagnosis present

## 2015-01-27 LAB — CBC
HEMATOCRIT: 35.6 % — AB (ref 36.0–46.0)
Hemoglobin: 12 g/dL (ref 12.0–15.0)
MCH: 30.2 pg (ref 26.0–34.0)
MCHC: 33.7 g/dL (ref 30.0–36.0)
MCV: 89.7 fL (ref 78.0–100.0)
PLATELETS: 182 10*3/uL (ref 150–400)
RBC: 3.97 MIL/uL (ref 3.87–5.11)
RDW: 13.5 % (ref 11.5–15.5)
WBC: 10.9 10*3/uL — AB (ref 4.0–10.5)

## 2015-01-27 LAB — CREATININE, SERUM
Creatinine, Ser: 0.51 mg/dL (ref 0.44–1.00)
GFR calc non Af Amer: >60 mL/min (ref >60)

## 2015-01-27 MED ORDER — SODIUM CHLORIDE 0.9 % IV SOLN
2.0000 g | INTRAVENOUS | Status: DC
  Administered 2015-01-27 (×2): 2 g via INTRAVENOUS
  Filled 2015-01-27 (×5): qty 2000

## 2015-01-27 MED ORDER — GENTAMICIN SULFATE 40 MG/ML IJ SOLN
3.0000 mg/kg/d | Freq: Three times a day (TID) | INTRAVENOUS | Status: DC
  Administered 2015-01-27: 80 mg via INTRAVENOUS
  Filled 2015-01-27 (×3): qty 2

## 2015-01-27 NOTE — Progress Notes (Signed)
Pt stated she was going to refuse future blood draws. CNM notified.

## 2015-01-27 NOTE — Consult Note (Addendum)
[facility]      Reason for Consult: fever    Referring Physician: Dr. Sallye Ober  Active Problems:   Fever and chills   . ampicillin (OMNIPEN) IV  2 g Intravenous 6 times per day  . docusate sodium  100 mg Oral Daily  . gentamicin  3 mg/kg/day Intravenous Q8H  . lactated ringers  500 mL Intravenous Once  . oseltamivir  75 mg Oral BID  . prenatal multivitamin  1 tablet Oral Q1200    Recommendations: Will stop antibacterials and observe off of antibacterials Continue Tamiflu 5 days total   Assessment: She has an influenza-like illness.  Flu swab negative but I agree with continued isolation.  She is on day two of antibacterials but not c/w bacterial infection so will stop antibiotics.  Listeria always possible but is past the typical season, no n/v/d (has been constipated) so less likley.    Discussed with her that viral illnesses can take 10-14 days to resolve.   Antibiotics: Gent/amp Tamiflu  HPI: Tara Sanchez is a 24 y.o. female [phone], almost 24 weeks who has been febrile for now 4-5 days, temp to 103 in CNM office and admitted 10/6.  She has continued to be febrile despite ampicillin, gentamicin.  Was started on Tamiflu empirically.  Flu swab negative.  There has been sporadic activity of flu in [place].  She has had chills and muscle aches associated with the chills.  No significant tachycardia,  WBC 14.3 but decreased with supportive care after 1 day.  UA with minimal WBCs and no symptoms.     Review of Systems: A comprehensive review of systems was negative except for: Constitutional: positive for fatigue  Past Medical History  Diagnosis Date  . Infection     UTI    Social History  Substance Use Topics  . Smoking status: Former Smoker -- 2 years    Types: Cigars  . Smokeless tobacco: Never Used  . Alcohol Use: No    Family History  Problem Relation Age of Onset  . Asthma Neg Hx   . Cancer Neg Hx   . Diabetes Neg Hx   . Heart  disease Neg Hx   . Hypertension Neg Hx   . Stroke Neg Hx    Allergies  Allergen Reactions  . Macrobid [Nitrofurantoin] Anaphylaxis    OBJECTIVE: Blood pressure 108/55, pulse 87, temperature 97.9 F (36.6 C), temperature source Oral, resp. rate 20, height  (1.702 m), weight 180 lb (81.647 kg), last menstrual period 07/23/2014, SpO2 97 %. General: awake, alert, nad HEENT: anicteric Skin: no rashes Lungs: CTA B Cor: RRR Abdomen: gravid Ext: no edema Neuro: non-focal   Microbiology: Recent Results (from the past 240 hour(s))  OB Urine Culture     Status: None (Preliminary result)   Collection Time: 01/25/15  8:45 PM  Result Value Ref Range Status   Specimen Description URINE, CLEAN CATCH  Final   Special Requests NONE  Final   Culture   Final    CULTURE REINCUBATED FOR BETTER GROWTH Performed at [facility]    Report Status PENDING  Incomplete  Culture, blood (routine x 2)     Status: None (Preliminary result)   Collection Time: 01/26/15  6:55 PM  Result Value Ref Range Status   Specimen Description BLOOD LEFT ARM  Final   Special Requests BOTTLES DRAWN AEROBIC AND ANAEROBIC 5CC  Final   Culture   Final    NO GROWTH <  12 HOURS Performed at [facility]    Report Status PENDING  Incomplete    Staci Righter, MD [facility] [email or web address] [phone] pager  [phone] cell 01/27/2015, 4:21 PM

## 2015-01-27 NOTE — Progress Notes (Addendum)
Hospital day # 2 pregnancy at [redacted]w[redacted]d--unexplained fever.  S:  Perception of contractions: none      Vaginal bleeding: none now       Vaginal discharge:  no significant change   Pt anxious to go home stating (I'm going home today.  I can lay in my bed at home since you don't know what wrong). We discussed the uncertainty of why she is spiking a fever, the labs we ran and the time it take to get results of cultures.  We discussed the sequela of an extremely high fever including but not limited to seizures and death to her and/or her unborn infant.   She state she doesn't understand why we aren't giving her tylenol around the clock.  We discuss the rational for tylenol and that it should not be given around the clock for long period of time, as it can be damaging to her liver.  Pt expressed understanding.  She understands going home mean she would be leaving AMA.    O: BP 111/54 mmHg  Pulse 107  Temp(Src) 98.8 F (37.1 C) (Oral)  Resp 20  Ht 5\' 7"  (1.702 m)  Wt 180 lb (81.647 kg)  BMI 28.19 kg/m2  SpO2 97%  LMP 07/23/2014 (Approximate)   Temp 01/26/15   100.5 at 1530 01/26/15   101.9 at 2200  Filed Vitals:   01/27/15 0555 01/27/15 0800 01/27/15 1000 01/27/15 1100  BP: 109/54  111/54   Pulse: 71  107   Temp: 98.5 F (36.9 C) 99.6 F (37.6 C) 102 F (38.9 C) 98.8 F (37.1 C)  TempSrc:  Oral Oral Oral  Resp: 20  20   Height:      Weight: 180 lb (81.647 kg)     SpO2:   97%         Fetal tracings:167 via doppler at 0755      Contractions:  none       Uterus gravid and non-tender      Extremities: extremities normal, atraumatic, no cyanosis or edema and no significant edema and no signs of DVT          Labs:   Results for orders placed or performed during the hospital encounter of 01/25/15 (from the past 24 hour(s))  Culture, blood (routine x 2)     Status: None (Preliminary result)   Collection Time: 01/26/15  6:55 PM  Result Value Ref Range   Specimen Description BLOOD LEFT ARM     Special Requests BOTTLES DRAWN AEROBIC AND ANAEROBIC 5CC    Culture      NO GROWTH < 12 HOURS Performed at [facility]    Report Status PENDING   CBC     Status: Abnormal   Collection Time: 01/27/15  5:35 AM  Result Value Ref Range   WBC 10.9 (H) 4.0 - 10.5 K/uL   RBC 3.97 3.87 - 5.11 MIL/uL   Hemoglobin 12.0 12.0 - 15.0 g/dL   HCT 16.1 (L) 09.6 - 04.5 %   MCV 89.7 78.0 - 100.0 fL   MCH 30.2 26.0 - 34.0 pg   MCHC 33.7 30.0 - 36.0 g/dL   RDW 40.9 81.1 - 91.4 %   Platelets 182 150 - 400 K/uL  Creatinine, serum     Status: None   Collection Time: 01/27/15  9:40 AM  Result Value Ref Range   Creatinine, Ser 0.51 0.44 - 1.00 mg/dL   GFR calc non Af Amer >60 >60 mL/min   GFR  calc Af Amer >60 >60 mL/min         Meds:  Current facility-administered medications:  .  acetaminophen (TYLENOL) tablet 650 mg, 650 mg, Oral, Q6H PRN, Nigel Bridgeman, CNM, 650 mg at 01/27/15 0949 .  ampicillin (OMNIPEN) 2 g in sodium chloride 0.9 % 50 mL IVPB, 2 g, Intravenous, 6 times per day, Hoover Browns, MD .  calcium carbonate (TUMS - dosed in mg elemental calcium) chewable tablet 400 mg of elemental calcium, 2 tablet, Oral, Q4H PRN, Nigel Bridgeman, CNM .  docusate sodium (COLACE) capsule 100 mg, 100 mg, Oral, Daily, Nigel Bridgeman, CNM, 100 mg at 01/26/15 1001 .  gentamicin (GARAMYCIN) 80 mg in dextrose 5 % 50 mL IVPB, 3 mg/kg/day, Intravenous, Q8H, Hoover Browns, MD, 80 mg at 01/27/15 0949 .  ibuprofen (ADVIL,MOTRIN) tablet 600 mg, 600 mg, Oral, Q6H PRN, Nigel Bridgeman, CNM, 600 mg at 01/27/15 0950 .  lactated ringers bolus 500 mL, 500 mL, Intravenous, Once **FOLLOWED BY** lactated ringers infusion, , Intravenous, Continuous, Alphonzo Severance, CNM, Last Rate: 150 mL/hr at 01/27/15 0549 .  lidocaine (XYLOCAINE) 2 % jelly 1 application, 1 application, Topical, Q4H PRN, Nigel Bridgeman, CNM .  oseltamivir (TAMIFLU) capsule 75 mg, 75 mg, Oral, BID, Nigel Bridgeman, CNM, 75 mg at 01/27/15 0951 .  prenatal multivitamin tablet 1  tablet, 1 tablet, Oral, Q1200, Nigel Bridgeman, CNM, 1 tablet at 01/26/15 1127 .  zolpidem (AMBIEN) tablet 5 mg, 5 mg, Oral, QHS PRN, Nigel Bridgeman, CNM, 5 mg at 01/26/15 2200  A: [redacted]w[redacted]d with unexplained fever spikes and return to normal SP tylenol     unchanged     Fetal tracings: 167     Contractions: none     Uterus non-tender      Extremities: DTR 1+, no clonus, no edema     IVF     IBP and/or tylenol PRN    Amp 2gm q4,     Tamiflu x 5 days    Blood cultures pending    Urine culture pending  P: Continue current plan of care      Upcoming tests/treatments:  Dopplers QS, temp q2,       MDs will follow  Venus Standard, CNM, MSN 01/27/2015. 11:16 AM  I saw and examined patient and agree with above findings, assessment and plan.  Additional exam: Gen: No acute distress.  CVS: s1, s2, RRR.  Pulm: Clear to auscultation bilaterally.  Abdomen: soft, non-tender, gravid.  + bowel sounds.  Extremities: warm and well perfused, no edema, no calf-tenderness.  Discussed with patient importance of continued inpatient management for fever control and diagnosis of fever origin. Differential diagnosis: Influenza, listeriosis.  Continue with ampicillin, gentamicin, antipyretics.  Follow up on blood and urine culture results.  Dr. Sallye Ober.

## 2015-01-28 LAB — CBC WITH DIFFERENTIAL/PLATELET
BASOS ABS: 0 10*3/uL (ref 0.0–0.1)
Basophils Relative: 0 %
Eosinophils Absolute: 0 10*3/uL (ref 0.0–0.7)
Eosinophils Relative: 0 %
HEMATOCRIT: 32.3 % — AB (ref 36.0–46.0)
HEMOGLOBIN: 10.9 g/dL — AB (ref 12.0–15.0)
LYMPHS PCT: 16 %
Lymphs Abs: 1.5 10*3/uL (ref 0.7–4.0)
MCH: 29.9 pg (ref 26.0–34.0)
MCHC: 33.7 g/dL (ref 30.0–36.0)
MCV: 88.7 fL (ref 78.0–100.0)
MONO ABS: 0.6 10*3/uL (ref 0.1–1.0)
Monocytes Relative: 6 %
NEUTROS ABS: 7.5 10*3/uL (ref 1.7–7.7)
NEUTROS PCT: 78 %
Platelets: 164 10*3/uL (ref 150–400)
RBC: 3.64 MIL/uL — AB (ref 3.87–5.11)
RDW: 13.3 % (ref 11.5–15.5)
WBC: 9.6 10*3/uL (ref 4.0–10.5)

## 2015-01-28 LAB — CULTURE, OB URINE: Culture: 70000

## 2015-01-28 MED ORDER — IBUPROFEN 600 MG PO TABS
600.0000 mg | ORAL_TABLET | Freq: Four times a day (QID) | ORAL | Status: DC
  Administered 2015-01-28 – 2015-01-29 (×3): 600 mg via ORAL
  Filled 2015-01-28 (×4): qty 1

## 2015-01-28 MED ORDER — BENZOCAINE-MENTHOL 20-0.5 % EX AERO
1.0000 "application " | INHALATION_SPRAY | Freq: Four times a day (QID) | CUTANEOUS | Status: DC | PRN
  Administered 2015-01-28: 1 via TOPICAL
  Filled 2015-01-28: qty 56

## 2015-01-28 NOTE — Progress Notes (Signed)
   01/28/15 0950  Vital Signs  Temp (!) 100.7 F (38.2 C)  Temp. Recheck(see above), FHT 185, Venus Standard, CNM notified no orders received.  Will continue to monitor.

## 2015-01-28 NOTE — Progress Notes (Signed)
Pt states if her temp and fetal HR is normal she is going to go home. RN discussed with pt risk of leaving AMA to herself and child. Verbalized understanding. Pt states "they cant tell my anything they don't know what is going on with me and I just want to be home in my bed.leave. I will take a nap and call you when I am ready to leave." Clyde Lundborg CNM notified that pt desires to leave AMA. Advised to call CNM when pt decides to walk out and she will come and sign AMA form. Will continue to monitor.

## 2015-01-28 NOTE — Progress Notes (Signed)
Addendum  Called by primary care nurse Kim to report temp elevated to 101.4 w/fetal tachycardia 190.  Nurse instructed to give IBP and recheck in 2 hour.  The recheck temp is 100.7 and fetal rate is still tachy at 185.  Dr Normand Sloop informed.  New orders to recheck in 1 hour.

## 2015-01-28 NOTE — Progress Notes (Addendum)
Hospital day # 3 pregnancy at [redacted]w[redacted]d--unexplained fever.  S:  Perception of contractions: none      Vaginal bleeding: none now       Vaginal discharge:  no significant change    Pt expressed frustration for why her condition/illness is unknown and desires to go home   O: BP 104/52 mmHg  Pulse 68  Temp(Src) 99.9 F (37.7 C) (Oral)  Resp 20  Ht  (1.702 m)  Wt 180 lb 6.4 oz (81.829 kg)  BMI 28.25 kg/m2  SpO2 98%  LMP 07/23/2014 (Approximate)      Fetal tracings:164      Contractions:  none       Uterus gravid and non-tender      Extremities: extremities normal, atraumatic, no cyanosis or edema and no significant edema and no signs of DVT     Filed Vitals:   01/28/15 0100 01/28/15 0236 01/28/15 0300 01/28/15 0530  BP:  101/62  104/52  Pulse:  72  68  Temp: 98.6 F (37 C) 99.6 F (37.6 C)  99.9 F (37.7 C)  TempSrc: Oral Oral  Oral  Resp:  20  20  Height:      Weight:   180 lb 6.4 oz (81.829 kg)   SpO2:  99%  98%        Labs:   Results for orders placed or performed during the hospital encounter of 01/25/15 (from the past 24 hour(s))  Creatinine, serum     Status: None   Collection Time: 01/27/15  9:40 AM  Result Value Ref Range   Creatinine, Ser 0.51 0.44 - 1.00 mg/dL   GFR calc non Af Amer >60 >60 mL/min   GFR calc Af Amer >60 >60 mL/min  CBC with Differential     Status: Abnormal   Collection Time: 01/28/15  5:25 AM  Result Value Ref Range   WBC 9.6 4.0 - 10.5 K/uL   RBC 3.64 (L) 3.87 - 5.11 MIL/uL   Hemoglobin 10.9 (L) 12.0 - 15.0 g/dL   HCT 96.0 (L) 45.4 - 09.8 %   MCV 88.7 78.0 - 100.0 fL   MCH 29.9 26.0 - 34.0 pg   MCHC 33.7 30.0 - 36.0 g/dL   RDW 11.9 14.7 - 82.9 %   Platelets 164 150 - 400 K/uL   Neutrophils Relative % 78 %   Neutro Abs 7.5 1.7 - 7.7 K/uL   Lymphocytes Relative 16 %   Lymphs Abs 1.5 0.7 - 4.0 K/uL   Monocytes Relative 6 %   Monocytes Absolute 0.6 0.1 - 1.0 K/uL   Eosinophils Relative 0 %   Eosinophils Absolute 0.0 0.0 - 0.7 K/uL    Basophils Relative 0 %   Basophils Absolute 0.0 0.0 - 0.1 K/uL         Meds:  Current facility-administered medications:  .  acetaminophen (TYLENOL) tablet 650 mg, 650 mg, Oral, Q6H PRN, Nigel Bridgeman, CNM, 650 mg at 01/28/15 0110 .  calcium carbonate (TUMS - dosed in mg elemental calcium) chewable tablet 400 mg of elemental calcium, 2 tablet, Oral, Q4H PRN, Nigel Bridgeman, CNM .  docusate sodium (COLACE) capsule 100 mg, 100 mg, Oral, Daily, Nigel Bridgeman, CNM, 100 mg at 01/26/15 1001 .  ibuprofen (ADVIL,MOTRIN) tablet 600 mg, 600 mg, Oral, Q6H PRN, Nigel Bridgeman, CNM, 600 mg at 01/27/15 1806 .  lactated ringers bolus 500 mL, 500 mL, Intravenous, Once **FOLLOWED BY** lactated ringers infusion, , Intravenous, Continuous, Alphonzo Severance, CNM, Last Rate: 150 mL/hr  at 01/27/15 0549 .  lidocaine (XYLOCAINE) 2 % jelly 1 application, 1 application, Topical, Q4H PRN, Nigel Bridgeman, CNM .  oseltamivir (TAMIFLU) capsule 75 mg, 75 mg, Oral, BID, Nigel Bridgeman, CNM, 75 mg at 01/27/15 2313 .  prenatal multivitamin tablet 1 tablet, 1 tablet, Oral, Q1200, Nigel Bridgeman, CNM, 1 tablet at 01/27/15 1118 .  zolpidem (AMBIEN) tablet 5 mg, 5 mg, Oral, QHS PRN, Nigel Bridgeman, CNM, 5 mg at 01/27/15 2343  A: [redacted]w[redacted]d with unexplained fever     stable     Fetal tracings: 164     Contractions: none     Uterus non-tender      Extremities: DTR 1+, no clonus, no edema     afebrile x 12 hours   P: Continue current plan of care     Possible DC today      Upcoming tests/treatments:  Cultures pending      MDs will follow  Adie Vilar, CNM, MSN 01/28/2015. 8:30 AM

## 2015-01-28 NOTE — Progress Notes (Signed)
   01/28/15 0911  Vital Signs  Temp (!) 101.4 F (38.6 C)  Venus Standard, CNM notified of above temp and FHT of 190.  Ibuprofen given and will recheck temp. And FHT @ 1000.

## 2015-01-28 NOTE — Progress Notes (Signed)
Pt c/o of chills T 98.6 taken by x2 different thermometer sources to confirm. Tylenol 650 mg given. Will continue to monitor.

## 2015-01-29 DIAGNOSIS — O98512 Other viral diseases complicating pregnancy, second trimester: Secondary | ICD-10-CM | POA: Diagnosis present

## 2015-01-29 LAB — CBC WITH DIFFERENTIAL/PLATELET
BASOS ABS: 0 10*3/uL (ref 0.0–0.1)
Basophils Relative: 0 %
EOS PCT: 0 %
Eosinophils Absolute: 0 10*3/uL (ref 0.0–0.7)
HEMATOCRIT: 29.8 % — AB (ref 36.0–46.0)
HEMOGLOBIN: 10.2 g/dL — AB (ref 12.0–15.0)
LYMPHS ABS: 1.2 10*3/uL (ref 0.7–4.0)
LYMPHS PCT: 10 %
MCH: 30.1 pg (ref 26.0–34.0)
MCHC: 34.2 g/dL (ref 30.0–36.0)
MCV: 87.9 fL (ref 78.0–100.0)
Monocytes Absolute: 0.7 10*3/uL (ref 0.1–1.0)
Monocytes Relative: 6 %
NEUTROS ABS: 10.5 10*3/uL — AB (ref 1.7–7.7)
NEUTROS PCT: 84 %
Platelets: 152 10*3/uL (ref 150–400)
RBC: 3.39 MIL/uL — AB (ref 3.87–5.11)
RDW: 13.2 % (ref 11.5–15.5)
WBC: 12.5 10*3/uL — AB (ref 4.0–10.5)

## 2015-01-29 LAB — RAPID HIV SCREEN (HIV 1/2 AB+AG)
HIV 1/2 Antibodies: NONREACTIVE
HIV-1 P24 Antigen - HIV24: NONREACTIVE

## 2015-01-29 LAB — GC/CHLAMYDIA PROBE AMP (~~LOC~~) NOT AT ARMC
Chlamydia: NEGATIVE
Neisseria Gonorrhea: NEGATIVE

## 2015-01-29 MED ORDER — GUAIFENESIN 100 MG/5ML PO SOLN: 5.0000 mL | ORAL | Status: DC | PRN

## 2015-01-29 MED ORDER — ONDANSETRON HCL 4 MG PO TABS
4.0000 mg | ORAL_TABLET | Freq: Three times a day (TID) | ORAL | Status: DC | PRN
  Administered 2015-01-29: 4 mg via ORAL
  Filled 2015-01-29: qty 1

## 2015-01-29 MED ORDER — ONDANSETRON HCL 4 MG PO TABS: 4.0000 mg | ORAL_TABLET | Freq: Three times a day (TID) | ORAL | Status: DC | PRN

## 2015-01-29 MED ORDER — OSELTAMIVIR PHOSPHATE 75 MG PO CAPS: 75.0000 mg | ORAL_CAPSULE | Freq: Two times a day (BID) | ORAL | Status: DC

## 2015-01-29 MED ORDER — ONDANSETRON HCL 4 MG/2ML IJ SOLN: 4.0000 mg | Freq: Four times a day (QID) | INTRAMUSCULAR | Status: DC | PRN

## 2015-01-29 MED ORDER — LORATADINE 10 MG PO TABS
10.0000 mg | ORAL_TABLET | Freq: Every day | ORAL | Status: DC | PRN
  Filled 2015-01-29: qty 1

## 2015-01-29 MED ORDER — IBUPROFEN 600 MG PO TABS: 600.0000 mg | ORAL_TABLET | Freq: Four times a day (QID) | ORAL | Status: DC | PRN

## 2015-01-29 NOTE — Discharge Summary (Signed)
Physician Discharge Summary  Patient ID: Tara Sanchez MRN: [id number] DOB/AGE: 05/05/1990 24 y.o.  Admit date: 01/25/2015 Discharge date: 01/29/2015  Admission Diagnoses:  IUP at 23 2/7 weeks, febrile illness  Discharge Diagnoses:  Active Problems:   Fever and chills   Viral disease during pregnancy in second trimester, antepartum   Discharged Condition: stable  Hospital Course: Admitted 01/25/15 with fever and chills of several days onset.  Denied N/V, diarrhea, sore throat, cough, congestion, abdominal pain, back pain, urinary sx, or any viral exposure.  No flu vaccine this year. Flu testing done, placed on Tamiflu for 5 day course.  Flu swab negative, but patient continued on course per recommendations of Dr. Estanislado Pandy.  Patient continued to spike fevers over the next few days. Listeria blood cultures were done, and patient was placed on Ampicillin course for this possible dx.  CXR was negative.  WBC ct was 14.3 on admission.  ID consult occurred on 10/8, with following assessment:  "She has an influenza-like illness. Flu swab negative but I agree with continued isolation. She is on day two of antibacterials but not c/w bacterial infection so will stop antibiotics. Listeria always possible but is past the typical season, no n/v/d (has been constipated) so less likely.  Discussed with her that viral illnesses can take 10-14 days to resolve."  Patient had several episodes of stating she was going to leave AMA, but did remain as requested until the morning of d/c.  Today, she does describe a dry cough, with some hyperactivity of her gag reflex with this cough, but she has been afebrile x 24 hours.  Requested Zofran for home use.  Per consult with Dr. Estanislado Pandy, she is d/c'd today.  She will complete the Tamiflu course (needs 3 more doses), Ibuprophen prn for the next week if needed.  She will take her temperature with any sx of fever/chills.  She will continue to be OOW until 10/13--note given.   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    Consults: ID  Significant Diagnostic Studies: labs:  CBC Latest Ref Rng 01/29/2015 01/28/2015 01/27/2015  WBC 4.0 - 10.5 K/uL 12.5(H) 9.6 10.9(H)  Hemoglobin 12.0 - 15.0 g/dL 10.2(L) 10.9(L) 12.0  Hematocrit 36.0 - 46.0 % 29.8(L) 32.3(L) 35.6(L)  Platelets 150 - 400 K/uL 152 164 182    and radiology: CXR: normal  Treatments: IV hydration and antibiotics: Ampicillin; Tamiflu, anti-pyretics  Discharge Exam: Blood pressure 125/65, pulse 78, temperature 98.5 F (36.9 C), temperature source Oral, resp. rate 25, height  (1.702 m), weight 81.897 kg (180 lb 8.8 oz), last menstrual period 07/23/2014, SpO2 98 %. General appearance: alert Ears: normal TM's and external ear canals both ears Throat: lips, mucosa, and tongue normal; teeth and gums normal Resp: clear to auscultation bilaterally Cardio: regular rate and rhythm, S1, S2 normal, no murmur, click, rub or gallop GI: soft, non-tender; bowel sounds normal; no masses,  no organomegaly Skin: Skin color, texture, turgor normal. No rashes or lesions  Uterus--gravid, NT, FHR 165 on day of d/c.  Disposition: 01-Home or Self Care     Medication List    TAKE these medications        acetaminophen 325 MG tablet  Commonly known as:  TYLENOL  Take 650 mg by mouth every 6 (six) hours as needed for mild pain, moderate pain or headache.     ibuprofen 600 MG tablet  Commonly known as:  ADVIL,MOTRIN  Take 1  tablet (600 mg total) by mouth every 6 (six) hours as needed for fever (For fever > or = 100.4--use until 25 weeks).     ondansetron 4 MG tablet  Commonly known as:  ZOFRAN  Take 1 tablet (4 mg total) by mouth every 8 (eight) hours as needed for nausea or vomiting.     oseltamivir 75 MG capsule  Commonly known as:  TAMIFLU  Take 1 capsule (75 mg total) by mouth 2 (two) times daily.     prenatal multivitamin Tabs tablet  Take 1 tablet  by mouth daily at 12 noon.           Follow-up Information    Follow up with [facility] On 02/08/2015.   Specialty:  Obstetrics and Gynecology   Why:  Call for any questions or concerns.  Call for any fever > 100.4, or any other worsening of sx.   Contact information:   [address] [place] [address] [phone]      Signed: Nigel Bridgeman 01/29/2015, 9:33 AM

## 2015-01-29 NOTE — Progress Notes (Addendum)
Tara Sanchez [id number]  Subjective: Nurse call reports patient c/o cough, vomiting, and pain in abdomen at stomach area.  In room to assess.  Patient reports vomiting twice after coughing fits. Patient states cough is dry and no nausea. Reports pain in abdomen has subsided with bowel movement.  Patient expresses frustrations with inpatient stay and inability to get diagnosis for fever.    Objective:  Filed Vitals:   01/28/15 2215 01/29/15 0005 01/29/15 0200 01/29/15 0350  BP: 113/60  111/59   Pulse: 68  85   Temp: 100 F (37.8 C) 98.4 F (36.9 C) 97.9 F (36.6 C) 98.8 F (37.1 C)  TempSrc: Oral Oral Oral Oral  Resp: 20  28   Height:      Weight:      SpO2: 100%  99%    PE: General: No Distress Noted Cardio: HRRR Lungs: CTA, Decreased slightly on LRQ Abd: BSx4Q, Soft, NT  Assessment: IUP at [redacted]w[redacted]d Fever of Unknown Origin Cough Vomiting  Plan: -Reassurances given regarding frustrations -Offered SW consult for social issues i.e inability to work, housing relocation; patient declines -Ordered Robitussin, Zofran, and Zyrtec -patient informed of new rx for prn usage, verbalizes understanding -Patient informed of labs for am to include HIV, CBC with Diff, and UA for GC/CT screen -Patient states she will be cooperative with lab draw, but reports frustrations with phlebotomist sticking her multiple times in past -No other questions or concerns -Patient continues to express desire for discharge in am -Continue other mgmt as ordered -Will update Dr. Sallyanne Havers as appropriate  Sabas Sous, CNM 01/29/2015 4:04 AM   Addendum: Nurse calls and reports IV site infiltration Okay to order zofran PO--4mg  ODT Q 6hrs PRN Continue to monitor  Laurence Folz LYNN  01/29/2015 6:44 AM

## 2015-01-29 NOTE — Progress Notes (Signed)
Pt discharged to home driving herself.  Condition stable. Pt ambulated to car with Selinda Michaels, RN.  No equipment for home ordered at discharge.

## 2015-01-29 NOTE — Plan of Care (Signed)
Problem: Discharge Progression Outcomes Goal: Other Referral (Specify with a note) Outcome: Completed/Met Date Met:  01/29/15 Infectious Disease MD saw patient

## 2015-01-29 NOTE — Discharge Instructions (Signed)
Fever, Adult °A fever is an increase in the body's temperature. It is often defined as a temperature of 100° F (38°C) or higher. Short mild or moderate fevers often have no long-term effects. They also often do not need treatment. Moderate or high fevers may make you feel uncomfortable. Sometimes, they can also be a sign of a serious illness or disease. The sweating that may happen with repeated fevers or fevers that last a while may also cause you to not have enough fluid in your body (dehydration). °You can take your temperature with a thermometer to see if you have a fever. A measured temperature can change with: °· Age. °· Time of day. °· Where the thermometer is placed: °¨ Mouth (oral). °¨ Rectum (rectal). °¨ Ear (tympanic). °¨ Underarm (axillary). °¨ Forehead (temporal). °HOME CARE °Pay attention to any changes in your symptoms. Take these actions to help with your condition: °· Take over-the-counter and prescription medicines only as told by your doctor. Follow the dosing instructions carefully. °· If you were prescribed an antibiotic medicine, take it as told by your doctor. Do not stop taking the antibiotic even if you start to feel better. °· Rest as needed. °· Drink enough fluid to keep your pee (urine) clear or pale yellow. °· Sponge yourself or bathe with room-temperature water as needed. This helps to lower your body temperature . Do not use ice water. °· Do not wear too many blankets or heavy clothes. °GET HELP IF: °· You throw up (vomit). °· You cannot eat or drink without throwing up. °· You have watery poop (diarrhea). °· It hurts when you pee. °· Your symptoms do not get better with treatment. °· You have new symptoms. °· You feel very weak. °GET HELP RIGHT AWAY IF: °· You are short of breath or have trouble breathing. °· You are dizzy or you pass out (faint). °· You feel confused. °· You have signs of not having enough fluid in your body, such as: °¨ A dry mouth. °¨ Peeing less. °¨ Looking  pale. °· You have very bad pain in your belly (abdomen). °· You keep throwing up or having water poop. °· You have a skin rash. °· Your symptoms suddenly get worse. °  °This information is not intended to replace advice given to you by your health care provider. Make sure you discuss any questions you have with your health care provider. °  °Document Released: 01/15/2008 Document Revised: 12/27/2014 Document Reviewed: 06/01/2014 °Elsevier Interactive Patient Education ©2016 [employer] ° °

## 2015-01-29 NOTE — Progress Notes (Signed)
Called to pt room states her right hand is swollen and IV is in the floor. States she does not want that anymore. Normal movement and sensation. Right had cold to touch and c/o pain at insertion site. Hand elevated on pillows. Gerrit Heck CNM notified. No orders to restart IV. Will continue to monitor.

## 2015-01-29 NOTE — Progress Notes (Signed)
Pt reports that she has decided to stay for the night and will leave in the am. No needs at this time. See flow sheet for v/s. Will continue to monitor.

## 2015-01-30 ENCOUNTER — Encounter (HOSPITAL_COMMUNITY): Payer: Self-pay | Admitting: *Deleted

## 2015-01-30 ENCOUNTER — Inpatient Hospital Stay (HOSPITAL_COMMUNITY)
Admission: AD | Admit: 2015-01-30 | Discharge: 2015-01-30 | Disposition: A | Discharge status: Home / Self Care | Payer: BLUE CROSS/BLUE SHIELD | Source: Ambulatory Visit | Attending: Obstetrics and Gynecology | Admitting: Obstetrics and Gynecology

## 2015-01-30 DIAGNOSIS — N909 Noninflammatory disorder of vulva and perineum, unspecified: Secondary | ICD-10-CM | POA: Diagnosis present

## 2015-01-30 DIAGNOSIS — Z3A24 24 weeks gestation of pregnancy: Secondary | ICD-10-CM | POA: Diagnosis not present

## 2015-01-30 DIAGNOSIS — Z87891 Personal history of nicotine dependence: Secondary | ICD-10-CM | POA: Diagnosis not present

## 2015-01-30 DIAGNOSIS — O26892 Other specified pregnancy related conditions, second trimester: Secondary | ICD-10-CM | POA: Insufficient documentation

## 2015-01-30 LAB — AMNISURE RUPTURE OF MEMBRANE (ROM) NOT AT ARMC: Amnisure ROM: NEGATIVE

## 2015-01-30 MED ORDER — LIDOCAINE HCL 2 % EX GEL
1.0000 "application " | Freq: Once | CUTANEOUS | Status: AC
  Administered 2015-01-30: 1 via TOPICAL
  Filled 2015-01-30: qty 5

## 2015-01-30 NOTE — Discharge Instructions (Signed)
USE SITZ BATH AS MUCH AS POSSIBLE. SPREAD NUMBING GEL ON AREA AS NEEDED.  Abscess An abscess (boil or furuncle) is an infected area on or under the skin. This area is filled with yellowish-white fluid (pus) and other material (debris). HOME CARE   Only take medicines as told by your doctor.  If you were given antibiotic medicine, take it as directed. Finish the medicine even if you start to feel better.  If gauze is used, follow your doctor's directions for changing the gauze.  To avoid spreading the infection:  Keep your abscess covered with a bandage.  Wash your hands well.  Do not share personal care items, towels, or whirlpools with others.  Avoid skin contact with others.  Keep your skin and clothes clean around the abscess.  Keep all doctor visits as told. GET HELP RIGHT AWAY IF:   You have more pain, puffiness (swelling), or redness in the wound site.  You have more fluid or blood coming from the wound site.  You have muscle aches, chills, or you feel sick.  You have a fever. MAKE SURE YOU:   Understand these instructions.  Will watch your condition.  Will get help right away if you are not doing well or get worse.   This information is not intended to replace advice given to you by your health care provider. Make sure you discuss any questions you have with your health care provider.   Document Released: 09/24/2007 Document Revised: 10/07/2011 Document Reviewed: 06/21/2011 Elsevier Interactive Patient Education [employer]

## 2015-01-30 NOTE — MAU Note (Signed)
Pt not on monitor per CNM. FHT dopplered

## 2015-01-30 NOTE — MAU Provider Note (Signed)
History   24 yo [id number] at 24 weeks presented unannounced c/o vulvar lesions that have become more painful in last 24 hours.  Was recently d/c'd from [facility] after 4 day admission for fever, likely viral illness.  Completing course of Tamiflu today.  Denies fever since d/c, is using Motrin q 6 hours for fever control.  Plans to d/c Motrin tomorrow.  Flu swab was negative, but ID MD and [facility] MD wanted patient to complete Tamiflu course.  Areas on right inner labia were present at the time of admission on 01/25/15, but appeared to be more c/w folliculitis--arose after patient shaved pubic hair, with pustular head noted at that time on 2 lesions, no ulceration or cellulitis.  Sitz bath used during hospitalization, but it was discarded prior to patient's d/c.  Patient also reports being "very wet" in underwear--unsure if drainage from lesions or leaking of fluid.  Denies bleeding or abdominal pain.  Patient Active Problem List   Diagnosis Date Noted  . Viral disease during pregnancy in second trimester, antepartum 01/29/2015  . Fever and chills 01/25/2015  . Preterm delivery--hx 31 week delivery 01/09/2015  . H/O 2 abortions 01/09/2015  . Maternal varicella, non-immune 01/09/2015  . Allergy history, drug--Phergan, Beckie Salts 01/09/2015    Chief Complaint  Patient presents with  . ingrown hair    HPI:  As above  OB History    Gravida Para Term Preterm AB TAB SAB Ectopic Multiple Living   4 1 0 0 1      Past Medical History  Diagnosis Date  . Infection     UTI    Past Surgical History  Procedure Laterality Date  . Therapeutic abortion      Family History  Problem Relation Age of Onset  . Asthma Neg Hx   . Cancer Neg Hx   . Diabetes Neg Hx   . Heart disease Neg Hx   . Hypertension Neg Hx   . Stroke Neg Hx     Social History  Substance Use Topics  . Smoking status: Former Smoker -- 2 years    Types: Cigars  . Smokeless tobacco: Never Used  . Alcohol  Use: No    Allergies:  Allergies  Allergen Reactions  . Macrobid [Nitrofurantoin] Anaphylaxis  . Phenergan [Promethazine Hcl] Other (See Comments)    Severe sedation  . Tessalon [Benzonatate] Other (See Comments)    Severe sedation    Prescriptions prior to admission  Medication Sig Dispense Refill Last Dose  . acetaminophen (TYLENOL) 325 MG tablet Take 650 mg by mouth every 6 (six) hours as needed for mild pain, moderate pain or headache.    01/25/2015 at Unknown time  . ibuprofen (ADVIL,MOTRIN) 600 MG tablet Take 1 tablet (600 mg total) by mouth every 6 (six) hours as needed for fever (For fever > or = 100.4--use until 25 weeks). 30 tablet 0   . ondansetron (ZOFRAN) 4 MG tablet Take 1 tablet (4 mg total) by mouth every 8 (eight) hours as needed for nausea or vomiting. 20 tablet 0   . oseltamivir (TAMIFLU) 75 MG capsule Take 1 capsule (75 mg total) by mouth 2 (two) times daily. 3 capsule 0   . Prenatal Vit-Fe Fumarate-FA (PRENATAL MULTIVITAMIN) TABS tablet Take 1 tablet by mouth daily at 12 noon.   Past Week at Unknown time    ROS:  Vulvar tenderness, wetness of underwear Physical Exam   Blood pressure 117/68, pulse 102, temperature 98.1  F (36.7 C), temperature source Oral, resp. rate 16, weight 82.736 kg (182 lb 6.4 oz), last menstrual period 07/23/2014.    Physical Exam  Genitourinary:       In NAD, other than discomfort from vulvar lesions Chest clear Heart RRR without murmur Abd gravid, NT Pelvic-2 ulcerated areas on right inner labia, tender to touch, small amount clear drainage, no surrounding cellulitis.  Single area above those lesions appears to be healing lesion, NT, no drainage.  No vaginal d/c.  Areas previously had pustular heads, but now appear to be draining spontaneously. Ext WNL  FHR 150 No UCs  Results for orders placed or performed during the hospital encounter of 01/30/15 (from the past 24 hour(s))  Amnisure rupture of membrane (rom)not at [facility]      Status: None   Collection Time: 01/30/15  6:40 PM  Result Value Ref Range   Amnisure ROM NEGATIVE     ED Course  Assessment: IUP at 24 weeks Right inner labial lesions Recent viral illness  Plan: D/C home with vulvar care instructions--sitz bath at least TID and prn, Lidocaine gel to area. HSV culture done--pending Wound culture done F/u as scheduled next week at [facility]--I will f/u with patient regarding culture results. To continue to monitor for recurrence of fever, and to call with any issues/concerns.      Nigel Bridgeman CNM, MSN 01/30/2015 6:43 PM

## 2015-01-30 NOTE — MAU Note (Signed)
Ingrown hairs. Spray isn't helping. Hurts so bad

## 2015-01-31 LAB — CULTURE, BLOOD (ROUTINE X 2): Culture: NO GROWTH

## 2015-02-01 LAB — HERPES SIMPLEX VIRUS CULTURE: Culture: DETECTED

## 2015-02-02 LAB — WOUND CULTURE: Gram Stain: NONE SEEN

## 2015-02-07 DIAGNOSIS — Z8619 Personal history of other infectious and parasitic diseases: Secondary | ICD-10-CM | POA: Insufficient documentation

## 2015-03-28 ENCOUNTER — Inpatient Hospital Stay (HOSPITAL_COMMUNITY): Payer: BLUE CROSS/BLUE SHIELD

## 2015-03-28 ENCOUNTER — Encounter (HOSPITAL_COMMUNITY): Payer: Self-pay

## 2015-03-28 ENCOUNTER — Inpatient Hospital Stay (HOSPITAL_COMMUNITY)
Admission: AD | Admit: 2015-03-28 | Discharge: 2015-03-29 | DRG: 778 | Disposition: A | Discharge status: Home / Self Care | Payer: BLUE CROSS/BLUE SHIELD | Source: Ambulatory Visit | Attending: Obstetrics and Gynecology | Admitting: Obstetrics and Gynecology

## 2015-03-28 DIAGNOSIS — N760 Acute vaginitis: Secondary | ICD-10-CM | POA: Diagnosis present

## 2015-03-28 DIAGNOSIS — B9689 Other specified bacterial agents as the cause of diseases classified elsewhere: Secondary | ICD-10-CM | POA: Diagnosis present

## 2015-03-28 DIAGNOSIS — Z87891 Personal history of nicotine dependence: Secondary | ICD-10-CM

## 2015-03-28 DIAGNOSIS — O4693 Antepartum hemorrhage, unspecified, third trimester: Secondary | ICD-10-CM | POA: Diagnosis present

## 2015-03-28 DIAGNOSIS — O329XX0 Maternal care for malpresentation of fetus, unspecified, not applicable or unspecified: Secondary | ICD-10-CM

## 2015-03-28 DIAGNOSIS — O23593 Infection of other part of genital tract in pregnancy, third trimester: Secondary | ICD-10-CM | POA: Diagnosis present

## 2015-03-28 DIAGNOSIS — O99820 Streptococcus B carrier state complicating pregnancy: Secondary | ICD-10-CM | POA: Diagnosis present

## 2015-03-28 DIAGNOSIS — Z3A32 32 weeks gestation of pregnancy: Secondary | ICD-10-CM | POA: Diagnosis not present

## 2015-03-28 LAB — TYPE AND SCREEN
ABO/RH(D): B POS
Antibody Screen: NEGATIVE

## 2015-03-28 LAB — CBC
HEMATOCRIT: 34.2 % — AB (ref 36.0–46.0)
Hemoglobin: 11.7 g/dL — ABNORMAL LOW (ref 12.0–15.0)
MCH: 30.7 pg (ref 26.0–34.0)
MCHC: 34.2 g/dL (ref 30.0–36.0)
MCV: 89.8 fL (ref 78.0–100.0)
Platelets: 215 10*3/uL (ref 150–400)
RBC: 3.81 MIL/uL — ABNORMAL LOW (ref 3.87–5.11)
RDW: 14.5 % (ref 11.5–15.5)
WBC: 13.7 10*3/uL — AB (ref 4.0–10.5)

## 2015-03-28 LAB — WET PREP, GENITAL
SPERM: NONE SEEN
TRICH WET PREP: NONE SEEN
Yeast Wet Prep HPF POC: NONE SEEN

## 2015-03-28 LAB — URINE MICROSCOPIC-ADD ON

## 2015-03-28 LAB — URINALYSIS, ROUTINE W REFLEX MICROSCOPIC
Bilirubin Urine: NEGATIVE
Glucose, UA: NEGATIVE mg/dL
KETONES UR: 15 mg/dL — AB
NITRITE: NEGATIVE
PH: 6 (ref 5.0–8.0)
Protein, ur: NEGATIVE mg/dL
SPECIFIC GRAVITY, URINE: 1.02 (ref 1.005–1.030)

## 2015-03-28 LAB — OB RESULTS CONSOLE GBS: STREP GROUP B AG: POSITIVE

## 2015-03-28 MED ORDER — METRONIDAZOLE 500 MG PO TABS: 500.0000 mg | ORAL_TABLET | Freq: Two times a day (BID) | ORAL | Status: DC

## 2015-03-28 MED ORDER — BETAMETHASONE SOD PHOS & ACET 6 (3-3) MG/ML IJ SUSP
12.0000 mg | Freq: Once | INTRAMUSCULAR | Status: AC
  Administered 2015-03-28: 12 mg via INTRAMUSCULAR
  Filled 2015-03-28: qty 2

## 2015-03-28 MED ORDER — PRENATAL MULTIVITAMIN CH: 1.0000 | ORAL_TABLET | Freq: Every day | ORAL | Status: DC

## 2015-03-28 MED ORDER — METRONIDAZOLE 500 MG PO TABS
500.0000 mg | ORAL_TABLET | Freq: Once | ORAL | Status: AC
  Administered 2015-03-28: 500 mg via ORAL
  Filled 2015-03-28: qty 1

## 2015-03-28 MED ORDER — MAGNESIUM SULFATE 50 % IJ SOLN
2.0000 g/h | INTRAVENOUS | Status: DC
  Filled 2015-03-28: qty 80

## 2015-03-28 MED ORDER — DOCUSATE SODIUM 100 MG PO CAPS: 100.0000 mg | ORAL_CAPSULE | Freq: Every day | ORAL | Status: DC

## 2015-03-28 MED ORDER — LACTATED RINGERS IV BOLUS (SEPSIS)
1000.0000 mL | Freq: Once | INTRAVENOUS | Status: AC
  Administered 2015-03-28: 1000 mL via INTRAVENOUS

## 2015-03-28 MED ORDER — PENICILLIN G POTASSIUM 5000000 UNITS IJ SOLR
5.0000 10*6.[IU] | Freq: Once | INTRAMUSCULAR | Status: AC
  Administered 2015-03-28: 5 10*6.[IU] via INTRAVENOUS
  Filled 2015-03-28: qty 5

## 2015-03-28 MED ORDER — MAGNESIUM SULFATE BOLUS VIA INFUSION
4.0000 g | Freq: Once | INTRAVENOUS | Status: DC
  Filled 2015-03-28: qty 500

## 2015-03-28 MED ORDER — ZOLPIDEM TARTRATE 5 MG PO TABS: 5.0000 mg | ORAL_TABLET | Freq: Every evening | ORAL | Status: DC | PRN

## 2015-03-28 MED ORDER — PENICILLIN G POTASSIUM 5000000 UNITS IJ SOLR
2.5000 10*6.[IU] | INTRAVENOUS | Status: DC
  Filled 2015-03-28 (×4): qty 2.5

## 2015-03-28 MED ORDER — ACETAMINOPHEN 325 MG PO TABS: 650.0000 mg | ORAL_TABLET | ORAL | Status: DC | PRN

## 2015-03-28 MED ORDER — CALCIUM CARBONATE ANTACID 500 MG PO CHEW: 2.0000 | CHEWABLE_TABLET | ORAL | Status: DC | PRN

## 2015-03-28 NOTE — MAU Note (Signed)
Pt reports spotting off/on today then about 1900 had increase in bleeding that "filled the toilet", and at that time she began to have pain in her vaginal area and her back.

## 2015-03-28 NOTE — H&P (Signed)
Tara Sanchez is a 24 y.o. female, [phone] at 32.1 weeks, presenting for vaginal bleeding.  After evaluation in MAU patient found to be contracting and making cervical change.  Consult with Dr. Delrae Sawyers results in patient admission and MgSO4 infusion.  Patient is GBS negative.  Patient with hx of PTL with 2011 pregnancy--onset of spontaneous labor, baby weighed 3+4, patient age 33 at the time. Has declined 17P several times during pregnancy--feels PTD was related to "stress".   Patient Active Problem List   Diagnosis Date Noted  . Viral disease during pregnancy in second trimester, antepartum 01/29/2015  . Fever and chills 01/25/2015  . Preterm delivery--hx 31 week delivery 01/09/2015  . H/O 2 abortions 01/09/2015  . Maternal varicella, non-immune 01/09/2015  . Allergy history, drug--Phergan, Tessalon Pearles 01/09/2015    History of present pregnancy: Patient entered care at 6.1 weeks.   EDC of 09/27/2014 was established by 6.1wk on 09/27/2014.   Anatomy scan:  18.2 weeks, with normal findings and an posterior placenta.   Additional [name] evaluations:  6.1wks: EDC CHANGED 05/22/2015 4CM CYST SEEN  18.2wks: U/S: [place], vertex, placenta posterior, cervix closed. Fluid normal Profile/palate, nasal bone Philtrum seen Open hands, 5th digit and heel and feet seen  Significant prenatal events: 1st Trimester: Seen for viability [name], didn't return to care until 15wks 2nd Trimester: Patient treated for UTI after c/o abdominal pain.  Treated for trichomoniasis. Diagnosed with HSV via vaginal culture. Seen and admitted for persistent fever. 3rd Trimester: C/O acne and greenish d/c   Last evaluation:  In office, 03/21/2015 by L. Montez Morita, FNP at 31.3wks.  FHR 148, BP 102/78, Wt 183lbs  OB History    Gravida Para Term Preterm AB TAB SAB Ectopic Multiple Living   4 1 0 0 1    2010--TAB 2011--SVB, 31 weeks, spontaneous labor, [facility] 2012--TAB  Past Medical History  Diagnosis Date  . Infection      UTI   Past Surgical History  Procedure Laterality Date  . Therapeutic abortion     Family History: family history is negative for Asthma, Cancer, Diabetes, Heart disease, Hypertension, and Stroke. Social History:  reports that she has quit smoking. Her smoking use included Cigars. She has never used smokeless tobacco. She reports that she does not drink alcohol or use illicit drugs.   Prenatal Transfer Tool  Maternal Diabetes: No Genetic Screening: Normal Maternal Ultrasounds/Referrals: Normal Fetal Ultrasounds or other Referrals:  None Maternal Substance Abuse:  No Significant Maternal Medications:  Meds include: Other:  Valtrex Significant Maternal Lab Results: Lab values include: Group B Strep negative    ROS:  +Vaginal pressure, +abdominal pain, +FM, -LoF, +VB  Allergies  Allergen Reactions  . Macrobid [Nitrofurantoin] Anaphylaxis  . Phenergan [Promethazine Hcl] Other (See Comments)    Severe sedation  . Tessalon [Benzonatate] Other (See Comments)    Severe sedation   Results for orders placed or performed during the hospital encounter of 03/28/15 (from the past 24 hour(s))  Urinalysis, Routine w reflex microscopic (not at [facility])     Status: Abnormal   Collection Time: 03/28/15  7:45 PM  Result Value Ref Range   Color, Urine YELLOW YELLOW   APPearance CLEAR CLEAR   Specific Gravity, Urine 1.020 1.005 - 1.030   pH 6.0 5.0 - 8.0   Glucose, UA NEGATIVE NEGATIVE mg/dL   Hgb urine dipstick MODERATE (A) NEGATIVE   Bilirubin Urine NEGATIVE NEGATIVE   Ketones, ur 15 (A) NEGATIVE mg/dL  Protein, ur NEGATIVE NEGATIVE mg/dL   Nitrite NEGATIVE NEGATIVE   Leukocytes, UA SMALL (A) NEGATIVE  Urine microscopic-add on     Status: Abnormal   Collection Time: 03/28/15  7:45 PM  Result Value Ref Range   Squamous Epithelial / LPF 0-5 (A) NONE SEEN   WBC, UA 6-30 0 - 5 WBC/hpf   RBC / HPF 6-30 0 - 5 RBC/hpf   Bacteria, UA FEW (A) NONE SEEN  Wet prep, genital     Status: Abnormal    Collection Time: 03/28/15  8:20 PM  Result Value Ref Range   Yeast Wet Prep HPF POC NONE SEEN NONE SEEN   Trich, Wet Prep NONE SEEN NONE SEEN   Clue Cells Wet Prep HPF POC PRESENT (A) NONE SEEN   WBC, Wet Prep HPF POC MODERATE (A) NONE SEEN   Sperm NONE SEEN   CBC     Status: Abnormal   Collection Time: 03/28/15  9:45 PM  Result Value Ref Range   WBC 13.7 (H) 4.0 - 10.5 K/uL   RBC 3.81 (L) 3.87 - 5.11 MIL/uL   Hemoglobin 11.7 (L) 12.0 - 15.0 g/dL   HCT 45.434.2 (L) 09.836.0 - 11.946.0 %   MCV 89.8 78.0 - 100.0 fL   MCH 30.7 26.0 - 34.0 pg   MCHC 34.2 30.0 - 36.0 g/dL   RDW 14.714.5 82.911.5 - 56.215.5 %   Platelets 215 150 - 400 K/uL  Type and screen     Status: None   Collection Time: 03/28/15  9:45 PM  Result Value Ref Range   ABO/RH(D) B POS    Antibody Screen NEG    Sample Expiration 03/31/2015   Group B strep by PCR     Status: Abnormal   Collection Time: 03/28/15 11:10 PM  Result Value Ref Range   Group B strep by PCR POSITIVE (A) NEGATIVE     Dilation: 4 Effacement (%): 50 Station: -2 Exam by:: J. Emly CNM Blood pressure 117/81, pulse 69, temperature 98.7 F (37.1 C), temperature source Oral, resp. rate 16, height 5\' 7"  (1.702 m), weight 84.369 kg (186 lb), last menstrual period 07/23/2014, SpO2 98 %.  Physical Exam  Vitals reviewed. Constitutional: She is oriented to person, place, and time. She appears well-developed and well-nourished. No distress.  HENT:  Head: Normocephalic and atraumatic.  Eyes: EOM are normal. Pupils are equal, round, and reactive to light.  Neck: Normal range of motion.  Cardiovascular: Normal rate.   Respiratory: Effort normal.  GI: Soft.  Genitourinary: Cervix exhibits discharge. Vaginal discharge found.  Sterile Speculum Exam: -Vulva: No lesions noted -Vaginal Vault: Thin beige colored discharge-wet prep collected -Cervix: Friable, Appears open, thick mucoid discharge from os-GC/CT collected Bimanual Exam: -3/50/-2; Initially; -4/50/-2; Vertex  undeterminable--BS US confirm Vertex  Musculoskeletal: Normal range of motion.  Neurological: She is alert and oriented to person, place, and time.  Skin: Skin is warm and dry.    FHR: 140 bpm, Mod Var, -Decels, -Accels UCs:  Q2-644min, palpates mild to moderate  Prenatal labs: ABO, Rh: --/--/B POS (12/07 2145) Antibody: NEG (12/07 2145) Rubella:  Immune RPR: Nonreactive (06/07 0000)  HBsAg: Negative (06/07 0000)  HIV: Non Reactive (05/28 1520)  GBS: Negative (08/12 0000) Sickle cell/Hgb electrophoresis:  Normal Pap:  None GC:  Negative Chlamydia:  Negative Other:  HSV Culture: Positive, PreAlbumin 18, Varicella NI    Assessment IUP at 32.1wks Cat I FT Preterm Labor Bacterial Vaginosis GBS Unknown  [facility]: [facility]  [facility], per consult with Dr.T. Kamyra Schroeck Routine Antepartum Orders per [facility] Protocol Start PCN prophylaxis for unknown GBS and await GBS rapid swab Start MgSO4 Infusion at 4gr bolus and 2 gr continuous Continuous fetal monitoring, Bedrest Patient declines MgSO4 infusion stating that "I have read bad things about this and it is unnatural." Patient educated on benefits of MgSO4 including neuroprotection, which reduces infant risk of cerebral palsy, and tocolytic effects which could slow and possibly stop labor. Patient understands and accepts these risks at current stating she will consult with mother and FOB via phone MD on call consulted and agrees to accept patient despite High NICU acuity  Joellyn Quails, MSN 03/28/2015, 9:55 PM

## 2015-03-29 LAB — GROUP B STREP BY PCR: Group B strep by PCR: POSITIVE — AB

## 2015-03-29 LAB — GC/CHLAMYDIA PROBE AMP (~~LOC~~) NOT AT ARMC
CHLAMYDIA, DNA PROBE: NEGATIVE
NEISSERIA GONORRHEA: NEGATIVE

## 2015-03-29 LAB — ABO/RH: ABO/RH(D): B POS

## 2015-03-29 MED ORDER — FENTANYL CITRATE (PF) 100 MCG/2ML IJ SOLN
50.0000 ug | INTRAMUSCULAR | Status: DC | PRN
  Administered 2015-03-29: 50 ug via INTRAVENOUS
  Filled 2015-03-29: qty 2

## 2015-03-29 MED ORDER — BETAMETHASONE SOD PHOS & ACET 6 (3-3) MG/ML IJ SUSP
12.0000 mg | Freq: Once | INTRAMUSCULAR | Status: AC
  Administered 2015-03-29: 12 mg via INTRAMUSCULAR
  Filled 2015-03-29: qty 2

## 2015-03-29 MED ORDER — NIFEDIPINE 10 MG PO CAPS: 10.0000 mg | ORAL_CAPSULE | ORAL | Status: DC | PRN

## 2015-03-29 MED ORDER — PENICILLIN G POTASSIUM 5000000 UNITS IJ SOLR
2.5000 10*6.[IU] | INTRAVENOUS | Status: DC
  Administered 2015-03-29 (×2): 2.5 10*6.[IU] via INTRAVENOUS
  Filled 2015-03-29 (×7): qty 2.5

## 2015-03-29 NOTE — Discharge Instructions (Signed)

## 2015-03-29 NOTE — Progress Notes (Addendum)
  Subjective: Patient advised RN she wanted to go home today, so she could go to work.  Reports minimal cramping, denies leaking or bleeding.  Still declines tocolytics.  Had received 2 doses PCN for previously-unknown GBS.  Objective: BP 115/60 mmHg  Pulse 70  Temp(Src) 97.9 F (36.6 C) (Oral)  Resp 18  Ht 5\' 7"  (1.702 m)  Wt 84.369 kg (186 lb)  BMI 29.12 kg/m2  SpO2 100%  LMP 07/23/2014 (Approximate)      FHT: Category 1 UC:   Occasional, mild SVE:   Dilation: 3.5 Effacement (%): 50 Station: -2 Exam by:: Manfred ArchV Juquan Reznick CNM--no change from previous exam  GBS positive by rapid PCR. Received 1st dose betamethasone at 2019.  Assessment:  IUP at 32 3/7 weeks PT cervical change Hx previous PTD at 31 weeks--declined 17P this pregnancy Declining all tocolysis.  Plan: Long discussion with patient regarding status and our concerns regarding cervical change in light of hx of previous PTD.   Recommendations made for continuing observation in hospital until tomorrow at a minimum, to complete betamethasone course and allow for cervical recheck in am.   Recommendations made for OOW and bedrest after d/c. With negotiation, patient agreeable to stay until 2nd dose of betamethasone received at 2100 tonight, with re-evaluation of cervix by 7pm by me. Patient plans to RTW--"I have to work".  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 Regular diet. BR with BRP today. Will consult with Dr. Su Hiltoberts regarding patient status and proposed plan for today. Support to patient for issues and concerns.  Nigel BridgemanLATHAM, Tanyiah Laurich CNM 03/29/2015, 9:00 AM  Addendum: Discussed patient status with Dr. Su Hiltoberts. Reviewed patient's need to continue to work and declination of tocolysis. Will re-evaluate cervix prior to 7pm. 2nd dose betamethasone at 9pm tonight. If stable, will plan d/c tonight after betamethasone, with instructions for pelvic rest and rest as much as  possible.  Nigel BridgemanVicki Raina Sole, CNM 03/29/15 1015

## 2015-03-29 NOTE — Progress Notes (Signed)
Pt states she doesn't want to be monitored while waiting for her shot.  Monitors off. Pt up to bathroom.

## 2015-03-29 NOTE — Discharge Summary (Signed)
Physician Discharge Summary  Patient ID: Tara Sanchez MRN: [id number] DOB/AGE: December 09, 1990 24 y.o.  Admit date: 03/28/2015 Discharge date: 03/29/2015  Admission Diagnoses:  Preterm labor, hx previous PTD Discharge Diagnoses:  Active Problems:   Preterm labor   Discharged Condition: stable  Hospital Course: Admitted 03/28/15 with cervix 3 cm initially, with change to 4 cm after observation.  Patient declined magnesium sulfate and Procardia, but accepted betamethasone.  GBS was done and resulted at positive, with several doses of PCN given IV.  Patient was having few contractions during hospitalization.  Patient desired d/c ASAP, but consented to remaining overnight, with repeat cervical exam on the evening of 03/29/15 demonstrating no change.  2nd dose betamethasone was given just prior to d/d. Patient declines being OOW, or on bedrest.  She was agreeable to decreasing her hours to 6 hour per day at her Cur[employer] job.  She was instructed to maintain pelvic and orgasmic rest, and s/s of advancing PTL were reviewed.  She is to keep scheduled visit on 12/14, and is to call prn.  She feels she knows "what my body is doing", and continues to decline tocolysis, reflecting "whatever will happen is God's will".  Consults: None  Significant Diagnostic Studies: labs:  Results for orders placed or performed during the hospital encounter of 03/28/15 (from the past 24 hour(s))  CBC     Status: Abnormal   Collection Time: 03/28/15  9:45 PM  Result Value Ref Range   WBC 13.7 (H) 4.0 - 10.5 K/uL   RBC 3.81 (L) 3.87 - 5.11 MIL/uL   Hemoglobin 11.7 (L) 12.0 - 15.0 g/dL   HCT 84.1 (L) 32.4 - 40.1 %   MCV 89.8 78.0 - 100.0 fL   MCH 30.7 26.0 - 34.0 pg   MCHC 34.2 30.0 - 36.0 g/dL   RDW 02.7 25.3 - 66.4 %   Platelets 215 150 - 400 K/uL  Type and screen     Status: None   Collection Time: 03/28/15  9:45 PM  Result Value Ref Range   ABO/RH(D) B POS    Antibody Screen NEG    Sample Expiration 03/31/2015    ABO/Rh     Status: None   Collection Time: 03/28/15  9:45 PM  Result Value Ref Range   ABO/RH(D) B POS   Group B strep by PCR     Status: Abnormal   Collection Time: 03/28/15 11:10 PM  Result Value Ref Range   Group B strep by PCR POSITIVE (A) NEGATIVE    Treatments: IV hydration and antibiotics: PCN  Discharge Exam: Blood pressure 135/59, pulse 65, temperature 98.2 F (36.8 C), temperature source Oral, resp. rate 16, height 5\' 7"  (1.702 m), weight 84.369 kg (186 lb), last menstrual period 07/23/2014, SpO2 100 %. General appearance: alert Resp: clear to auscultation bilaterally Cardio: normal apical impulse Pelvic: external genitalia normal, no adnexal masses or tenderness, no cervical motion tenderness, rectovaginal septum normal, uterus normal size, shape, and consistency, vagina normal without discharge and cervix 3 cm, 50%, vtx, -2 on the evening of 03/29/15. Extremities: extremities normal, atraumatic, no cyanosis or edema   FHR Category 1 No UCs.  Disposition: 01-Home or Self Care  Discharge Instructions    Discharge patient    Complete by:  As directed             Medication List    STOP taking these medications        ibuprofen 600 MG tablet  Commonly known as:  ADVIL,MOTRIN     ondansetron 4 MG tablet  Commonly known as:  ZOFRAN     oseltamivir 75 MG capsule  Commonly known as:  TAMIFLU      TAKE these medications        acetaminophen 325 MG tablet  Commonly known as:  TYLENOL  Take 650 mg by mouth every 6 (six) hours as needed for mild pain, moderate pain or headache.     metroNIDAZOLE 500 MG tablet  Commonly known as:  FLAGYL  Take 1 tablet (500 mg total) by mouth 2 (two) times daily.     prenatal multivitamin Tabs tablet  Take 1 tablet by mouth daily at 12 noon.      ASK your doctor about these medications        valACYclovir 1000 MG tablet  Commonly known as:  VALTREX  Take 1,000 mg by mouth daily.           Follow-up Information     Follow up with [facility] On 04/04/2015.   Specialty:  Obstetrics and Gynecology   Why:  Please call if you have any questions or concerns, prior to your next visit.   Contact information:   [address] [place] [address] [phone]      Signed: Nigel Bridgeman 03/29/2015, 6:24 PM

## 2015-03-29 NOTE — Progress Notes (Signed)
FHR strip reviewed by 2nd RN prior to patient's discharge.  Reviewed by Emilio MathLeanne Carpenter RN.

## 2015-03-29 NOTE — Progress Notes (Addendum)
Adalberto Illmber J Rana MRN: [id number]  Subjective: -Nurse call reports patient reports increase in pain and requests medication.  In room to assess.  Dr. Delrae Sawyers. Ashon Rosenberg at bedside.  Patient informed of procardia and fentanyl availability.  Patient denying all tocolytics.    Objective: BP 135/66 mmHg  Pulse 67  Temp(Src) 98.6 F (37 C) (Oral)  Resp 18  Ht 5\' 7"  (1.702 m)  Wt 84.369 kg (186 lb)  BMI 29.12 kg/m2  SpO2 100%  LMP 07/23/2014 (Approximate)      Fetal Monitoring: FHT: 145 bpm, Mod Var, -Decels, -Accels UC: Palpates mild    Vaginal Exam: SVE:   Deferred Membranes:Intact Internal Monitors: None  Augmentation/Induction: Pitocin:None Cytotec: None  Assessment:  IUP at 32.2wks Cat I FT  Preterm Labor  Plan: -Dr. Delrae Sawyers. Cleotha Tsang at bedside to discuss and educate patient of risk associated with preterm delivery including but not limited to need for NICU admission, learning disabilities, intracranial hemorrhage, developmental delays, necrotizing entercolitis, and infections -Patient verbalizes understanding and denies tocolytics stating "it is unnatural" and if "it is Gods will."   -Patient informed that it is provider and MD obligation to inform of risks so that she can make an informed decision.   -Fentanyl IV 50-16100mcg IV Q 1 hr prn -Procardia per [facility] protocol -Patient informed of availability of medications and encouraged to consider  -NICU consult ordered for tomorrow morning -Continue other mgmt as ordered  Valma CavaJessica L Emly,MSN, CNM 03/29/2015, 12:20 AM  Addendum: GBS positive  Continue PCN prophylaxis as ordered  Patient Seen and examined.  magnesium sulfate was recommended to treat preterm labor however she refuses intervention. She also refuses procardia .  I discussed the risk of prematurity with the patient personally as outlined above.

## 2015-03-29 NOTE — Progress Notes (Signed)
POC to D/C home tonight after 2nd betamethasone shot around 830-9pm.   Manfred ArchV Latham CNM reviewed instructions with pt.

## 2015-04-04 ENCOUNTER — Encounter (HOSPITAL_COMMUNITY): Payer: Self-pay | Admitting: *Deleted

## 2015-04-04 ENCOUNTER — Inpatient Hospital Stay (HOSPITAL_COMMUNITY)
Admission: AD | Admit: 2015-04-04 | Discharge: 2015-04-04 | Disposition: A | Discharge status: Home / Self Care | Payer: BLUE CROSS/BLUE SHIELD | Source: Ambulatory Visit | Attending: Obstetrics and Gynecology | Admitting: Obstetrics and Gynecology

## 2015-04-04 DIAGNOSIS — Z3A33 33 weeks gestation of pregnancy: Secondary | ICD-10-CM | POA: Diagnosis not present

## 2015-04-04 DIAGNOSIS — O4700 False labor before 37 completed weeks of gestation, unspecified trimester: Secondary | ICD-10-CM

## 2015-04-04 DIAGNOSIS — Z87891 Personal history of nicotine dependence: Secondary | ICD-10-CM | POA: Diagnosis not present

## 2015-04-04 DIAGNOSIS — O4703 False labor before 37 completed weeks of gestation, third trimester: Secondary | ICD-10-CM

## 2015-04-04 LAB — URINALYSIS, ROUTINE W REFLEX MICROSCOPIC
BILIRUBIN URINE: NEGATIVE
Glucose, UA: NEGATIVE mg/dL
HGB URINE DIPSTICK: NEGATIVE
KETONES UR: NEGATIVE mg/dL
Nitrite: NEGATIVE
PROTEIN: NEGATIVE mg/dL
Specific Gravity, Urine: 1.01 (ref 1.005–1.030)
pH: 6.5 (ref 5.0–8.0)

## 2015-04-04 LAB — URINE MICROSCOPIC-ADD ON
Bacteria, UA: NONE SEEN
RBC / HPF: NONE SEEN RBC/hpf (ref 0–5)

## 2015-04-04 LAB — WET PREP, GENITAL
CLUE CELLS WET PREP: NONE SEEN
SPERM: NONE SEEN
TRICH WET PREP: NONE SEEN
Yeast Wet Prep HPF POC: NONE SEEN

## 2015-04-04 LAB — POCT FERN TEST

## 2015-04-04 LAB — AMNISURE RUPTURE OF MEMBRANE (ROM) NOT AT ARMC: Amnisure ROM: NEGATIVE

## 2015-04-04 NOTE — Discharge Instructions (Signed)
Preterm Labor Information °Preterm labor is when labor starts before you are [redacted] weeks pregnant. The normal length of pregnancy is 39 to 41 weeks.  °CAUSES  °The cause of preterm labor is not often known. The most common known cause is infection. °RISK FACTORS °· Having a history of preterm labor. °· Having your water break before it should. °· Having a placenta that covers the opening of the cervix. °· Having a placenta that breaks away from the uterus. °· Having a cervix that is too weak to hold the baby in the uterus. °· Having too much fluid in the amniotic sac. °· Taking drugs or smoking while pregnant. °· Not gaining enough weight while pregnant. °· Being younger than 18 and older than 22 years old. °· Having a low income. °· Being African American. °SYMPTOMS °· Period-like cramps, belly (abdominal) pain, or back pain. °· Contractions that are regular, as often as six in an hour. They may be mild or painful. °· Contractions that start at the top of the belly. They then move to the lower belly and back. °· Lower belly pressure that seems to get stronger. °· Bleeding from the vagina. °· Fluid leaking from the vagina. °TREATMENT  °Treatment depends on: °· Your condition. °· The condition of your baby. °· How many weeks pregnant you are. °Your doctor may have you: °· Take medicine to stop contractions. °· Stay in bed except to use the restroom (bed rest). °· Stay in the hospital. °WHAT SHOULD YOU DO IF YOU THINK YOU ARE IN PRETERM LABOR? °Call your doctor right away. You need to go to the hospital right away.  °HOW CAN YOU PREVENT PRETERM LABOR IN FUTURE PREGNANCIES? °· Stop smoking, if you smoke. °· Maintain healthy weight gain. °· Do not take drugs or be around chemicals that are not needed. °· Tell your doctor if you think you have an infection. °· Tell your doctor if you had a preterm labor before. °  °This information is not intended to replace advice given to you by your health care provider. Make sure you  discuss any questions you have with your health care provider. °  °Document Released: 07/04/2008 Document Revised: 08/22/2014 Document Reviewed: 05/10/2012 °Elsevier Interactive Patient Education ©2016 [employer] Inc[employer] ° °

## 2015-04-04 NOTE — MAU Provider Note (Signed)
History     24 yo, [id number], 33.1wks presents to MAU from  Routing Ob visit at [facility] office for threatened preterm labor after intercourse last night/this morning and reports of decreased fetal movement. Reactive NST in office with contractions noted every 2-4 minutes.  Patient denies feeling contractions.   Pt states she originally was not going to come to the MAU as instructed because she "doesn't feel like he is coming" but experienced a small gush of fluid.   Cervix in office reported to be 4/100/-2.   Pt initially unable to provide a urine sample.   Patient Active Problem List   Diagnosis Date Noted  . Threatened preterm labor 04/04/2015  . Preterm labor 03/28/2015  . Viral disease during pregnancy in second trimester, antepartum 01/29/2015  . Fever and chills 01/25/2015  . Preterm delivery--hx 31 week delivery 01/09/2015  . H/O 2 abortions 01/09/2015  . Maternal varicella, non-immune 01/09/2015  . Allergy history, drug--Phergan, Beckie Salts 01/09/2015    Chief Complaint  Patient presents with  . preterm contractions    HPI  OB History    Gravida Para Term Preterm AB TAB SAB Ectopic Multiple Living   4 1 0 0 1      Past Medical History  Diagnosis Date  . Infection     UTI    Past Surgical History  Procedure Laterality Date  . Therapeutic abortion      Family History  Problem Relation Age of Onset  . Asthma Neg Hx   . Cancer Neg Hx   . Diabetes Neg Hx   . Heart disease Neg Hx   . Hypertension Neg Hx   . Stroke Neg Hx   . Alcohol abuse Neg Hx   . Arthritis Neg Hx   . Birth defects Neg Hx   . COPD Neg Hx   . Depression Neg Hx   . Drug abuse Neg Hx   . Early death Neg Hx   . Hearing loss Neg Hx   . Hyperlipidemia Neg Hx   . Kidney disease Neg Hx   . Learning disabilities Neg Hx   . Mental illness Neg Hx   . Mental retardation Neg Hx   . Miscarriages / Stillbirths Neg Hx   . Vision loss Neg Hx   . Varicose Veins Neg Hx     Social History   Substance Use Topics  . Smoking status: Former Smoker -- 2 years    Types: Cigars  . Smokeless tobacco: Never Used  . Alcohol Use: No    Allergies:  Allergies  Allergen Reactions  . Macrobid [Nitrofurantoin] Anaphylaxis  . Phenergan [Promethazine Hcl] Other (See Comments)    Severe sedation  . Tessalon [Benzonatate] Other (See Comments)    Severe sedation    Prescriptions prior to admission  Medication Sig Dispense Refill Last Dose  . acetaminophen (TYLENOL) 325 MG tablet Take 650 mg by mouth every 6 (six) hours as needed for mild pain, moderate pain or headache.    prn  . metroNIDAZOLE (FLAGYL) 500 MG tablet Take 1 tablet (500 mg total) by mouth 2 (two) times daily. 13 tablet 0   . Prenatal Vit-Fe Fumarate-FA (PRENATAL MULTIVITAMIN) TABS tablet Take 1 tablet by mouth daily at 12 noon.   03/28/2015 at 0800  . valACYclovir (VALTREX) 1000 MG tablet Take 1,000 mg by mouth daily.   03/28/2015 at Unknown time    ROS Physical Exam   Blood pressure 133/86, pulse 108, temperature  98.6 F (37 C), temperature source Oral, resp. rate 18, last menstrual period 07/23/2014.   Physical Exam  Constitutional: She is oriented to person, place, and time. She appears well-developed and well-nourished.  HENT:  Head: Normocephalic and atraumatic.  Eyes: EOM are normal. Pupils are equal, round, and reactive to light.  Neck: Normal range of motion.  Cardiovascular: Normal rate and regular rhythm.   Respiratory: Effort normal.  GI: Soft.  Genitourinary: Vaginal discharge found.  No pooling noted in vaginal vault , Fern Negative White mucousy discharge noted  Musculoskeletal: Normal range of motion.  Neurological: She is alert and oriented to person, place, and time.  Skin: Skin is warm and dry.  Psychiatric: She has a normal mood and affect. Her behavior is normal. Judgment and thought content normal.      Results for orders placed or performed during the hospital encounter of 04/04/15  (from the past 24 hour(s))  Amnisure rupture of membrane (rom)not at [facility]     Status: None   Collection Time: 04/04/15  3:25 PM  Result Value Ref Range   Amnisure ROM NEGATIVE   Wet prep, genital     Status: Abnormal   Collection Time: 04/04/15  3:25 PM  Result Value Ref Range   Yeast Wet Prep HPF POC NONE SEEN NONE SEEN   Trich, Wet Prep NONE SEEN NONE SEEN   Clue Cells Wet Prep HPF POC NONE SEEN NONE SEEN   WBC, Wet Prep HPF POC MODERATE (A) NONE SEEN   Sperm NONE SEEN   POCT fern test     Status: Normal   Collection Time: 04/04/15  3:57 PM  Result Value Ref Range   POCT Fern Test    Urinalysis, Routine w reflex microscopic (not at [facility])     Status: Abnormal   Collection Time: 04/04/15  4:40 PM  Result Value Ref Range   Color, Urine YELLOW YELLOW   APPearance CLEAR CLEAR   Specific Gravity, Urine 1.010 1.005 - 1.030   pH 6.5 5.0 - 8.0   Glucose, UA NEGATIVE NEGATIVE mg/dL   Hgb urine dipstick NEGATIVE NEGATIVE   Bilirubin Urine NEGATIVE NEGATIVE   Ketones, ur NEGATIVE NEGATIVE mg/dL   Protein, ur NEGATIVE NEGATIVE mg/dL   Nitrite NEGATIVE NEGATIVE   Leukocytes, UA SMALL (A) NEGATIVE  Urine microscopic-add on     Status: Abnormal   Collection Time: 04/04/15  4:40 PM  Result Value Ref Range   Squamous Epithelial / LPF 0-5 (A) NONE SEEN   WBC, UA 0-5 0 - 5 WBC/hpf   RBC / HPF NONE SEEN 0 - 5 RBC/hpf   Bacteria, UA NONE SEEN NONE SEEN   Monitoring 2 hrs 5 min:  FHR: Cat 1, 145 bpm, moderate variability, +accels  UC: regular every 2-4 min, unable to palpate   ED Course  Assessment: IUP at 33.1wks Cat I FT Threatened Preterm Labor  Negative pooling Negative  Fern Negative Amniosure GBS Postive  Plan: -Recommended Procardia 10 mg & educated on benefits of Procardia - Pt declined  -Recommended HGSO4 &  educated on benefits of MGSO4 including neuroprotection, which reduces infant risk of cerebral palsy, and tocolytic effects which could slow and possibly stop labor -  Pt declinesstating that "I Don't feel like he is coming and the medication is unnatural"  -Since Patient declines tocolysis, pt offered admission and observation- Patient states " that is stupid, un less I am in active labor since he is not coming now"  - Pt offered  IV hydration, declined- states "I will drink water" -Patient Patient understands risks  and declines medication or hospital admission -Discussed  & recommended the need for Bedrest and pelvic rest  - Patient states she needs to work and request a note to reduce her work hours to 6 hrs per day  -Patient acknowledges and agrees to pelvic rest  -Encouraged to drink lots of water for hydration.  Discharge home with Preterm labor precautions & encouraged to call if symptoms worsen   Alphonzo SeveranceRachel Noraa Pickeral CNM, MSN 04/04/2015 5:23 PM

## 2015-04-04 NOTE — MAU Note (Signed)
Pt sent from MD office for evaluation of PTL.  Pt was on monitor @ [facility] office, was told she is having uc's 2 minutes apart.  Pt states she was having contractions @ home but doesn't really feel them now.  SVE in office today 3 cm's/thin per pt.  Pt denies bleeding but states she ? Leaking at home after leaving [facility] office.

## 2015-04-05 LAB — GC/CHLAMYDIA PROBE AMP (~~LOC~~) NOT AT ARMC
CHLAMYDIA, DNA PROBE: NEGATIVE
NEISSERIA GONORRHEA: NEGATIVE

## 2015-04-08 ENCOUNTER — Inpatient Hospital Stay (HOSPITAL_COMMUNITY)
Admission: AD | Admit: 2015-04-08 | Discharge: 2015-04-08 | Disposition: A | Discharge status: Home / Self Care | Payer: BLUE CROSS/BLUE SHIELD | Source: Ambulatory Visit | Attending: Obstetrics and Gynecology | Admitting: Obstetrics and Gynecology

## 2015-04-08 ENCOUNTER — Encounter (HOSPITAL_COMMUNITY): Payer: Self-pay | Admitting: *Deleted

## 2015-04-08 DIAGNOSIS — Z3A33 33 weeks gestation of pregnancy: Secondary | ICD-10-CM | POA: Insufficient documentation

## 2015-04-08 DIAGNOSIS — O4703 False labor before 37 completed weeks of gestation, third trimester: Secondary | ICD-10-CM

## 2015-04-08 DIAGNOSIS — Z87891 Personal history of nicotine dependence: Secondary | ICD-10-CM | POA: Diagnosis not present

## 2015-04-08 DIAGNOSIS — R030 Elevated blood-pressure reading, without diagnosis of hypertension: Secondary | ICD-10-CM | POA: Insufficient documentation

## 2015-04-08 LAB — URINE MICROSCOPIC-ADD ON

## 2015-04-08 LAB — COMPREHENSIVE METABOLIC PANEL
ALK PHOS: 140 U/L — AB (ref 38–126)
ALT: 15 U/L (ref 14–54)
AST: 20 U/L (ref 15–41)
Albumin: 2.5 g/dL — ABNORMAL LOW (ref 3.5–5.0)
Anion gap: 8 (ref 5–15)
BUN: 10 mg/dL (ref 6–20)
CALCIUM: 8.9 mg/dL (ref 8.9–10.3)
CO2: 23 mmol/L (ref 22–32)
CREATININE: 0.57 mg/dL (ref 0.44–1.00)
Chloride: 106 mmol/L (ref 101–111)
Glucose, Bld: 105 mg/dL — ABNORMAL HIGH (ref 65–99)
Potassium: 4.1 mmol/L (ref 3.5–5.1)
SODIUM: 137 mmol/L (ref 135–145)
Total Bilirubin: 0.3 mg/dL (ref 0.3–1.2)
Total Protein: 6.5 g/dL (ref 6.5–8.1)

## 2015-04-08 LAB — URINALYSIS, ROUTINE W REFLEX MICROSCOPIC
BILIRUBIN URINE: NEGATIVE
GLUCOSE, UA: NEGATIVE mg/dL
Hgb urine dipstick: NEGATIVE
KETONES UR: NEGATIVE mg/dL
NITRITE: NEGATIVE
PH: 6 (ref 5.0–8.0)
Protein, ur: NEGATIVE mg/dL
SPECIFIC GRAVITY, URINE: 1.025 (ref 1.005–1.030)

## 2015-04-08 LAB — CBC
HCT: 34.3 % — ABNORMAL LOW (ref 36.0–46.0)
HEMOGLOBIN: 11.5 g/dL — AB (ref 12.0–15.0)
MCH: 30.7 pg (ref 26.0–34.0)
MCHC: 33.5 g/dL (ref 30.0–36.0)
MCV: 91.5 fL (ref 78.0–100.0)
Platelets: 198 10*3/uL (ref 150–400)
RBC: 3.75 MIL/uL — AB (ref 3.87–5.11)
RDW: 14.2 % (ref 11.5–15.5)
WBC: 10.2 10*3/uL (ref 4.0–10.5)

## 2015-04-08 LAB — PROTEIN / CREATININE RATIO, URINE
Creatinine, Urine: 150 mg/dL
Protein Creatinine Ratio: 0.06 mg/mg{Cre} (ref 0.00–0.15)
Total Protein, Urine: 9 mg/dL

## 2015-04-08 LAB — LACTATE DEHYDROGENASE: LDH: 138 U/L (ref 98–192)

## 2015-04-08 LAB — URIC ACID: URIC ACID, SERUM: 5.5 mg/dL (ref 2.3–6.6)

## 2015-04-08 MED ORDER — NIFEDIPINE 10 MG PO CAPS: 10.0000 mg | ORAL_CAPSULE | Freq: Four times a day (QID) | ORAL | Status: DC | PRN

## 2015-04-08 MED ORDER — NIFEDIPINE 10 MG PO CAPS
10.0000 mg | ORAL_CAPSULE | Freq: Once | ORAL | Status: AC
  Administered 2015-04-08: 10 mg via ORAL
  Filled 2015-04-08: qty 1

## 2015-04-08 NOTE — MAU Note (Signed)
Pt presents to MAU with complaints of contractions that are getting worse. Pt was evaluated in MAU on December the 14th and refused being admitted for magnesium. States that she is hurting and needs the oral medication they offered her on December the 14th

## 2015-04-08 NOTE — Discharge Instructions (Signed)

## 2015-04-08 NOTE — MAU Provider Note (Signed)
History     24 yo, [id number], 33.5wks presents to the MAU for preterm contractions. States she has recently felt like she is "swollen" and states she is tired of being pregnant.  Also states the she now wants the "pill" referring to procardia recommended during previous evaluation. Recently evaluated in MAU on 12/08 and 12/14 for preterm contractions and refused tocolytics. Denies HA, visual disturbances, or epigastric pain. Reports +FM.      Patient Active Problem List   Diagnosis Date Noted  . Threatened preterm labor 04/04/2015  . Preterm labor 03/28/2015  . Viral disease during pregnancy in second trimester, antepartum 01/29/2015  . Fever and chills 01/25/2015  . Preterm delivery--hx 31 week delivery 01/09/2015  . H/O 2 abortions 01/09/2015  . Maternal varicella, non-immune 01/09/2015  . Allergy history, drug--Phergan, Beckie Salts 01/09/2015    Chief Complaint  Patient presents with  . Contractions   HPI  OB History    Gravida Para Term Preterm AB TAB SAB Ectopic Multiple Living   4 1 0 0 1      Past Medical History  Diagnosis Date  . Infection     UTI    Past Surgical History  Procedure Laterality Date  . Therapeutic abortion      Family History  Problem Relation Age of Onset  . Asthma Neg Hx   . Cancer Neg Hx   . Diabetes Neg Hx   . Heart disease Neg Hx   . Hypertension Neg Hx   . Stroke Neg Hx   . Alcohol abuse Neg Hx   . Arthritis Neg Hx   . Birth defects Neg Hx   . COPD Neg Hx   . Depression Neg Hx   . Drug abuse Neg Hx   . Early death Neg Hx   . Hearing loss Neg Hx   . Hyperlipidemia Neg Hx   . Kidney disease Neg Hx   . Learning disabilities Neg Hx   . Mental illness Neg Hx   . Mental retardation Neg Hx   . Miscarriages / Stillbirths Neg Hx   . Vision loss Neg Hx   . Varicose Veins Neg Hx     Social History  Substance Use Topics  . Smoking status: Former Smoker -- 2 years    Types: Cigars  . Smokeless tobacco: Never Used  .  Alcohol Use: No    Allergies:  Allergies  Allergen Reactions  . Macrobid [Nitrofurantoin] Anaphylaxis  . Phenergan [Promethazine Hcl] Other (See Comments)    Reaction:  Severe sedation  . Tessalon [Benzonatate] Other (See Comments)    Reaction:  Severe sedation    Prescriptions prior to admission  Medication Sig Dispense Refill Last Dose  . Prenatal Vit-Fe Fumarate-FA (PRENATAL MULTIVITAMIN) TABS tablet Take 1 tablet by mouth daily.    04/08/2015 at Unknown time    ROS Physical Exam   Blood pressure 132/82, pulse 78, temperature 98.6 F (37 C), temperature source Oral, resp. rate 18, height  (1.702 m), weight 84.369 kg (186 lb), last menstrual period 07/23/2014, SpO2 100 %.  Filed Vitals:   04/08/15 1851 04/08/15 1920 04/08/15 1957 04/08/15 2023  BP: 127/70  132/82 135/79  Pulse:   78 76  Temp:      TempSrc:      Resp:      Height:   (1.702 m)    Weight:  84.369 kg (186 lb)    SpO2:  100%  Results for orders placed or performed during the hospital encounter of 04/08/15 (from the past 24 hour(s))  Urinalysis, Routine w reflex microscopic (not at [facility])     Status: Abnormal   Collection Time: 04/08/15  6:40 PM  Result Value Ref Range   Color, Urine YELLOW YELLOW   APPearance CLEAR CLEAR   Specific Gravity, Urine 1.025 1.005 - 1.030   pH 6.0 5.0 - 8.0   Glucose, UA NEGATIVE NEGATIVE mg/dL   Hgb urine dipstick NEGATIVE NEGATIVE   Bilirubin Urine NEGATIVE NEGATIVE   Ketones, ur NEGATIVE NEGATIVE mg/dL   Protein, ur NEGATIVE NEGATIVE mg/dL   Nitrite NEGATIVE NEGATIVE   Leukocytes, UA SMALL (A) NEGATIVE  Urine microscopic-add on     Status: Abnormal   Collection Time: 04/08/15  6:40 PM  Result Value Ref Range   Squamous Epithelial / LPF 0-5 (A) NONE SEEN   WBC, UA 6-30 0 - 5 WBC/hpf   RBC / HPF 0-5 0 - 5 RBC/hpf   Bacteria, UA FEW (A) NONE SEEN  Protein / creatinine ratio, urine     Status: None   Collection Time: 04/08/15  6:40 PM  Result Value Ref  Range   Creatinine, Urine 150.00 mg/dL   Total Protein, Urine 9 mg/dL   Protein Creatinine Ratio 0.06 0.00 - 0.15 mg/mg[Cre]  CBC     Status: Abnormal   Collection Time: 04/08/15  8:15 PM  Result Value Ref Range   WBC 10.2 4.0 - 10.5 K/uL   RBC 3.75 (L) 3.87 - 5.11 MIL/uL   Hemoglobin 11.5 (L) 12.0 - 15.0 g/dL   HCT 16.1 (L) 09.6 - 04.5 %   MCV 91.5 78.0 - 100.0 fL   MCH 30.7 26.0 - 34.0 pg   MCHC 33.5 30.0 - 36.0 g/dL   RDW 40.9 81.1 - 91.4 %   Platelets 198 150 - 400 K/uL  Comprehensive metabolic panel     Status: Abnormal   Collection Time: 04/08/15  8:15 PM  Result Value Ref Range   Sodium 137 135 - 145 mmol/L   Potassium 4.1 3.5 - 5.1 mmol/L   Chloride 106 101 - 111 mmol/L   CO2 23 22 - 32 mmol/L   Glucose, Bld 105 (H) 65 - 99 mg/dL   BUN 10 6 - 20 mg/dL   Creatinine, Ser 7.82 0.44 - 1.00 mg/dL   Calcium 8.9 8.9 - 95.6 mg/dL   Total Protein 6.5 6.5 - 8.1 g/dL   Albumin 2.5 (L) 3.5 - 5.0 g/dL   AST 20 15 - 41 U/L   ALT 15 14 - 54 U/L   Alkaline Phosphatase 140 (H) 38 - 126 U/L   Total Bilirubin 0.3 0.3 - 1.2 mg/dL   GFR calc non Af Amer >60 >60 mL/min   GFR calc Af Amer >60 >60 mL/min   Anion gap 8 5 - 15  Lactate dehydrogenase     Status: None   Collection Time: 04/08/15  8:15 PM  Result Value Ref Range   LDH 138 98 - 192 U/L  Uric acid     Status: None   Collection Time: 04/08/15  8:15 PM  Result Value Ref Range   Uric Acid, Serum 5.5 2.3 - 6.6 mg/dL     2.1/30/-8 FHT:  Cat 1, BL 150 bpm,moderate variability, +accels, no decels UC: regular q 4-7 min Membranes: intact   Physical Exam  Constitutional: She is oriented to person, place, and time. She appears well-developed and well-nourished.  HENT:  Head: Normocephalic.  Eyes: EOM are normal. Pupils are equal, round, and reactive to light.  Neck: Normal range of motion.  Cardiovascular: Normal rate, regular rhythm and normal heart sounds.   Respiratory: Effort normal and breath sounds normal.  GI: Soft.  Bowel sounds are normal.  Musculoskeletal: Normal range of motion.  Neurological: She is alert and oriented to person, place, and time. She has normal reflexes.  Skin: Skin is warm and dry.  Psychiatric: She has a normal mood and affect. Her behavior is normal. Judgment and thought content normal.    ED Course  Assessment: IUP at 33.5wks Cat I FT Preterm contractions  Cervix 3/80/-2 GBS Postive Mild Elevated BP    Plan: Discharge home Procardia 10 mg q 20 min - 1 dose given in MAU  RX procardia - 10 mg q 6hrs prn  Preterm Contractions/labor precautions provided Pelvic Rest recommended Encouraged to drink water Encouraged to rest at home- pt states she needs to work- note for work provided  Encouraged to elevate feet and rest,  as needed Follow up with scheduled prenatal appointment Call if symptoms worsen  Alphonzo SeveranceRachel Brissa Asante CNM, MSN 04/08/2015 8:02 PM

## 2015-04-19 ENCOUNTER — Inpatient Hospital Stay (HOSPITAL_COMMUNITY)
Admission: AD | Admit: 2015-04-19 | Discharge: 2015-04-19 | Disposition: A | Discharge status: Home / Self Care | Payer: BLUE CROSS/BLUE SHIELD | Source: Ambulatory Visit | Attending: Obstetrics & Gynecology | Admitting: Obstetrics & Gynecology

## 2015-04-19 ENCOUNTER — Encounter (HOSPITAL_COMMUNITY): Payer: Self-pay | Admitting: *Deleted

## 2015-04-19 DIAGNOSIS — O4700 False labor before 37 completed weeks of gestation, unspecified trimester: Secondary | ICD-10-CM | POA: Diagnosis not present

## 2015-04-19 DIAGNOSIS — Z3A35 35 weeks gestation of pregnancy: Secondary | ICD-10-CM | POA: Insufficient documentation

## 2015-04-19 DIAGNOSIS — Z888 Allergy status to other drugs, medicaments and biological substances status: Secondary | ICD-10-CM | POA: Diagnosis not present

## 2015-04-19 DIAGNOSIS — B019 Varicella without complication: Secondary | ICD-10-CM | POA: Insufficient documentation

## 2015-04-19 DIAGNOSIS — R109 Unspecified abdominal pain: Secondary | ICD-10-CM | POA: Insufficient documentation

## 2015-04-19 DIAGNOSIS — Z87891 Personal history of nicotine dependence: Secondary | ICD-10-CM | POA: Diagnosis not present

## 2015-04-19 DIAGNOSIS — O9989 Other specified diseases and conditions complicating pregnancy, childbirth and the puerperium: Secondary | ICD-10-CM | POA: Diagnosis present

## 2015-04-19 LAB — URINALYSIS, ROUTINE W REFLEX MICROSCOPIC
Bilirubin Urine: NEGATIVE
GLUCOSE, UA: NEGATIVE mg/dL
HGB URINE DIPSTICK: NEGATIVE
Ketones, ur: NEGATIVE mg/dL
NITRITE: NEGATIVE
PH: 6 (ref 5.0–8.0)
Protein, ur: NEGATIVE mg/dL
Specific Gravity, Urine: 1.025 (ref 1.005–1.030)

## 2015-04-19 LAB — CBC WITH DIFFERENTIAL/PLATELET
BASOS ABS: 0 10*3/uL (ref 0.0–0.1)
BASOS PCT: 0 %
EOS ABS: 0.1 10*3/uL (ref 0.0–0.7)
Eosinophils Relative: 1 %
HEMATOCRIT: 39.2 % (ref 36.0–46.0)
HEMOGLOBIN: 13.4 g/dL (ref 12.0–15.0)
Lymphocytes Relative: 35 %
Lymphs Abs: 4.2 10*3/uL — ABNORMAL HIGH (ref 0.7–4.0)
MCH: 30.7 pg (ref 26.0–34.0)
MCHC: 34.2 g/dL (ref 30.0–36.0)
MCV: 89.9 fL (ref 78.0–100.0)
Monocytes Absolute: 0.8 10*3/uL (ref 0.1–1.0)
Monocytes Relative: 6 %
NEUTROS ABS: 6.9 10*3/uL (ref 1.7–7.7)
NEUTROS PCT: 58 %
Platelets: 218 10*3/uL (ref 150–400)
RBC: 4.36 MIL/uL (ref 3.87–5.11)
RDW: 13.8 % (ref 11.5–15.5)
WBC: 12 10*3/uL — AB (ref 4.0–10.5)

## 2015-04-19 LAB — PROTEIN / CREATININE RATIO, URINE
CREATININE, URINE: 184 mg/dL
Protein Creatinine Ratio: 0.08 mg/mg{Cre} (ref 0.00–0.15)
TOTAL PROTEIN, URINE: 14 mg/dL

## 2015-04-19 LAB — COMPREHENSIVE METABOLIC PANEL
ALBUMIN: 3 g/dL — AB (ref 3.5–5.0)
ALK PHOS: 171 U/L — AB (ref 38–126)
ALT: 13 U/L — AB (ref 14–54)
ANION GAP: 9 (ref 5–15)
AST: 16 U/L (ref 15–41)
BUN: 12 mg/dL (ref 6–20)
CALCIUM: 8.9 mg/dL (ref 8.9–10.3)
CHLORIDE: 104 mmol/L (ref 101–111)
CO2: 23 mmol/L (ref 22–32)
Creatinine, Ser: 0.64 mg/dL (ref 0.44–1.00)
GFR calc non Af Amer: >60 mL/min (ref >60)
GLUCOSE: 73 mg/dL (ref 65–99)
Potassium: 4.1 mmol/L (ref 3.5–5.1)
SODIUM: 136 mmol/L (ref 135–145)
Total Bilirubin: 0.2 mg/dL — ABNORMAL LOW (ref 0.3–1.2)
Total Protein: 7 g/dL (ref 6.5–8.1)

## 2015-04-19 LAB — LACTATE DEHYDROGENASE: LDH: 165 U/L (ref 98–192)

## 2015-04-19 LAB — URINE MICROSCOPIC-ADD ON

## 2015-04-19 LAB — URIC ACID: Uric Acid, Serum: 5.7 mg/dL (ref 2.3–6.6)

## 2015-04-19 NOTE — Discharge Instructions (Signed)
Fetal Movement Counts  Patient Name: __________________________________________________ Patient Due Date: ____________________  Performing a fetal movement count is highly recommended in high-risk pregnancies, but it is good for every pregnant woman to do. Your health care provider may ask you to start counting fetal movements at 28 weeks of the pregnancy. Fetal movements often increase:  · After eating a full meal.  · After physical activity.  · After eating or drinking something sweet or cold.  · At rest.  Pay attention to when you feel the baby is most active. This will help you notice a pattern of your baby's sleep and wake cycles and what factors contribute to an increase in fetal movement. It is important to perform a fetal movement count at the same time each day when your baby is normally most active.   HOW TO COUNT FETAL MOVEMENTS  1. Find a quiet and comfortable area to sit or lie down on your left side. Lying on your left side provides the best blood and oxygen circulation to your baby.  2. Write down the day and time on a sheet of paper or in a journal.  3. Start counting kicks, flutters, swishes, rolls, or jabs in a 2-hour period. You should feel at least 10 movements within 2 hours.  4. If you do not feel 10 movements in 2 hours, wait 2-3 hours and count again. Look for a change in the pattern or not enough counts in 2 hours.  SEEK MEDICAL CARE IF:  · You feel less than 10 counts in 2 hours, tried twice.  · There is no movement in over an hour.  · The pattern is changing or taking longer each day to reach 10 counts in 2 hours.  · You feel the baby is not moving as he or she usually does.  Date: ____________ Movements: ____________ Start time: ____________ Finish time: ____________   Date: ____________ Movements: ____________ Start time: ____________ Finish time: ____________  Date: ____________ Movements: ____________ Start time: ____________ Finish time: ____________  Date: ____________ Movements:  ____________ Start time: ____________ Finish time: ____________  Date: ____________ Movements: ____________ Start time: ____________ Finish time: ____________  Date: ____________ Movements: ____________ Start time: ____________ Finish time: ____________  Date: ____________ Movements: ____________ Start time: ____________ Finish time: ____________  Date: ____________ Movements: ____________ Start time: ____________ Finish time: ____________   Date: ____________ Movements: ____________ Start time: ____________ Finish time: ____________  Date: ____________ Movements: ____________ Start time: ____________ Finish time: ____________  Date: ____________ Movements: ____________ Start time: ____________ Finish time: ____________  Date: ____________ Movements: ____________ Start time: ____________ Finish time: ____________  Date: ____________ Movements: ____________ Start time: ____________ Finish time: ____________  Date: ____________ Movements: ____________ Start time: ____________ Finish time: ____________  Date: ____________ Movements: ____________ Start time: ____________ Finish time: ____________   Date: ____________ Movements: ____________ Start time: ____________ Finish time: ____________  Date: ____________ Movements: ____________ Start time: ____________ Finish time: ____________  Date: ____________ Movements: ____________ Start time: ____________ Finish time: ____________  Date: ____________ Movements: ____________ Start time: ____________ Finish time: ____________  Date: ____________ Movements: ____________ Start time: ____________ Finish time: ____________  Date: ____________ Movements: ____________ Start time: ____________ Finish time: ____________  Date: ____________ Movements: ____________ Start time: ____________ Finish time: ____________   Date: ____________ Movements: ____________ Start time: ____________ Finish time: ____________  Date: ____________ Movements: ____________ Start time: ____________ Finish  time: ____________  Date: ____________ Movements: ____________ Start time: ____________ Finish time: ____________  Date: ____________ Movements: ____________ Start time:   ____________ Finish time: ____________  Date: ____________ Movements: ____________ Start time: ____________ Finish time: ____________  Date: ____________ Movements: ____________ Start time: ____________ Finish time: ____________  Date: ____________ Movements: ____________ Start time: ____________ Finish time: ____________   Date: ____________ Movements: ____________ Start time: ____________ Finish time: ____________  Date: ____________ Movements: ____________ Start time: ____________ Finish time: ____________  Date: ____________ Movements: ____________ Start time: ____________ Finish time: ____________  Date: ____________ Movements: ____________ Start time: ____________ Finish time: ____________  Date: ____________ Movements: ____________ Start time: ____________ Finish time: ____________  Date: ____________ Movements: ____________ Start time: ____________ Finish time: ____________  Date: ____________ Movements: ____________ Start time: ____________ Finish time: ____________   Date: ____________ Movements: ____________ Start time: ____________ Finish time: ____________  Date: ____________ Movements: ____________ Start time: ____________ Finish time: ____________  Date: ____________ Movements: ____________ Start time: ____________ Finish time: ____________  Date: ____________ Movements: ____________ Start time: ____________ Finish time: ____________  Date: ____________ Movements: ____________ Start time: ____________ Finish time: ____________  Date: ____________ Movements: ____________ Start time: ____________ Finish time: ____________  Date: ____________ Movements: ____________ Start time: ____________ Finish time: ____________   Date: ____________ Movements: ____________ Start time: ____________ Finish time: ____________  Date: ____________  Movements: ____________ Start time: ____________ Finish time: ____________  Date: ____________ Movements: ____________ Start time: ____________ Finish time: ____________  Date: ____________ Movements: ____________ Start time: ____________ Finish time: ____________  Date: ____________ Movements: ____________ Start time: ____________ Finish time: ____________  Date: ____________ Movements: ____________ Start time: ____________ Finish time: ____________  Date: ____________ Movements: ____________ Start time: ____________ Finish time: ____________   Date: ____________ Movements: ____________ Start time: ____________ Finish time: ____________  Date: ____________ Movements: ____________ Start time: ____________ Finish time: ____________  Date: ____________ Movements: ____________ Start time: ____________ Finish time: ____________  Date: ____________ Movements: ____________ Start time: ____________ Finish time: ____________  Date: ____________ Movements: ____________ Start time: ____________ Finish time: ____________  Date: ____________ Movements: ____________ Start time: ____________ Finish time: ____________     This information is not intended to replace advice given to you by your health care provider. Make sure you discuss any questions you have with your health care provider.     Document Released: 05/07/2006 Document Revised: 04/28/2014 Document Reviewed: 02/02/2012  Elsevier Interactive Patient Education ©2016 [employer]

## 2015-04-19 NOTE — MAU Note (Signed)
Lower abd pain for the last 2 days, denies bleeding or LOF.

## 2015-04-19 NOTE — MAU Provider Note (Signed)
Tara Sanchez is a 24 y.o. [phone] at 35.2 weeks. Pt told the nurse she had lower abd pain x 2 days.  She told me "I just doesn't feel good I think I need to be out of work.  I'm tired of being pregnant. Am I going to have my baby today?"  When asked about abd pain she denied pain today.  States she had pain yesterday. Denies HA or vision changed.   History     Patient Active Problem List   Diagnosis Date Noted  . Threatened preterm labor 04/04/2015  . Preterm labor 03/28/2015  . Viral disease during pregnancy in second trimester, antepartum 01/29/2015  . Fever and chills 01/25/2015  . Preterm delivery--hx 31 week delivery 01/09/2015  . H/O 2 abortions 01/09/2015  . Maternal varicella, non-immune 01/09/2015  . Allergy history, drug--Phergan, Beckie Saltsessalon Pearles 01/09/2015    Chief Complaint  Patient presents with  . Abdominal Pain   HPI  OB History    Gravida Para Term Preterm AB TAB SAB Ectopic Multiple Living   4 1 0 1 2 2    0 1      Past Medical History  Diagnosis Date  . Infection     UTI    Past Surgical History  Procedure Laterality Date  . Therapeutic abortion      Family History  Problem Relation Age of Onset  . Asthma Neg Hx   . Cancer Neg Hx   . Diabetes Neg Hx   . Heart disease Neg Hx   . Hypertension Neg Hx   . Stroke Neg Hx   . Alcohol abuse Neg Hx   . Arthritis Neg Hx   . Birth defects Neg Hx   . COPD Neg Hx   . Depression Neg Hx   . Drug abuse Neg Hx   . Early death Neg Hx   . Hearing loss Neg Hx   . Hyperlipidemia Neg Hx   . Kidney disease Neg Hx   . Learning disabilities Neg Hx   . Mental illness Neg Hx   . Mental retardation Neg Hx   . Miscarriages / Stillbirths Neg Hx   . Vision loss Neg Hx   . Varicose Veins Neg Hx     Social History  Substance Use Topics  . Smoking status: Former Smoker -- 2 years    Types: Cigars  . Smokeless tobacco: Never Used  . Alcohol Use: No    Allergies:  Allergies  Allergen Reactions  . Macrobid  [Nitrofurantoin] Anaphylaxis  . Phenergan [Promethazine Hcl] Other (See Comments)    Reaction:  Severe sedation  . Tessalon [Benzonatate] Other (See Comments)    Reaction:  Severe sedation    Prescriptions prior to admission  Medication Sig Dispense Refill Last Dose  . Prenatal Vit-Fe Fumarate-FA (PRENATAL MULTIVITAMIN) TABS tablet Take 1 tablet by mouth daily.    Past Week at Unknown time  . NIFEdipine (PROCARDIA) 10 MG capsule Take 1 capsule (10 mg total) by mouth every 6 (six) hours as needed. 30 capsule 0     ROS See HPI above, all other systems are negative  Physical Exam   Blood pressure 140/90, pulse 63, temperature 97.9 F (36.6 C), temperature source Oral, resp. rate 18, last menstrual period 07/23/2014, SpO2 100 %.  Physical Exam Ext:  WNL ABD: Soft, non tender to palpation, no rebound or guarding SVE: deferred   ED Course  Assessment: IUP at  35.2weeks Membranes: intact FHR: Category 1 CTX:  none   Plan:  Labs: PIH labs  Kikuye Korenek, CNM, MSN 04/19/2015. 4:04 PM  MAU Addendum Note  Results for orders placed or performed during the hospital encounter of 04/19/15 (from the past 24 hour(s))  Protein / creatinine ratio, urine     Status: None   Collection Time: 04/19/15  2:13 PM  Result Value Ref Range   Creatinine, Urine 184.00 mg/dL   Total Protein, Urine 14 mg/dL   Protein Creatinine Ratio 0.08 0.00 - 0.15 mg/mg[Cre]  Urinalysis, Routine w reflex microscopic (not at [facility])     Status: Abnormal   Collection Time: 04/19/15  2:43 PM  Result Value Ref Range   Color, Urine YELLOW YELLOW   APPearance CLEAR CLEAR   Specific Gravity, Urine 1.025 1.005 - 1.030   pH 6.0 5.0 - 8.0   Glucose, UA NEGATIVE NEGATIVE mg/dL   Hgb urine dipstick NEGATIVE NEGATIVE   Bilirubin Urine NEGATIVE NEGATIVE   Ketones, ur NEGATIVE NEGATIVE mg/dL   Protein, ur NEGATIVE NEGATIVE mg/dL   Nitrite NEGATIVE NEGATIVE   Leukocytes, UA TRACE (A) NEGATIVE  Urine microscopic-add  on     Status: Abnormal   Collection Time: 04/19/15  2:43 PM  Result Value Ref Range   Squamous Epithelial / LPF 0-5 (A) NONE SEEN   WBC, UA 0-5 0 - 5 WBC/hpf   RBC / HPF 0-5 0 - 5 RBC/hpf   Bacteria, UA RARE (A) NONE SEEN  CBC with Differential/Platelet     Status: Abnormal   Collection Time: 04/19/15  4:30 PM  Result Value Ref Range   WBC 12.0 (H) 4.0 - 10.5 K/uL   RBC 4.36 3.87 - 5.11 MIL/uL   Hemoglobin 13.4 12.0 - 15.0 g/dL   HCT 16.1 09.6 - 04.5 %   MCV 89.9 78.0 - 100.0 fL   MCH 30.7 26.0 - 34.0 pg   MCHC 34.2 30.0 - 36.0 g/dL   RDW 40.9 81.1 - 91.4 %   Platelets 218 150 - 400 K/uL   Neutrophils Relative % 58 %   Neutro Abs 6.9 1.7 - 7.7 K/uL   Lymphocytes Relative 35 %   Lymphs Abs 4.2 (H) 0.7 - 4.0 K/uL   Monocytes Relative 6 %   Monocytes Absolute 0.8 0.1 - 1.0 K/uL   Eosinophils Relative 1 %   Eosinophils Absolute 0.1 0.0 - 0.7 K/uL   Basophils Relative 0 %   Basophils Absolute 0.0 0.0 - 0.1 K/uL  Lactate dehydrogenase     Status: None   Collection Time: 04/19/15  4:30 PM  Result Value Ref Range   LDH 165 98 - 192 U/L  Comprehensive metabolic panel     Status: Abnormal   Collection Time: 04/19/15  4:30 PM  Result Value Ref Range   Sodium 136 135 - 145 mmol/L   Potassium 4.1 3.5 - 5.1 mmol/L   Chloride 104 101 - 111 mmol/L   CO2 23 22 - 32 mmol/L   Glucose, Bld 73 65 - 99 mg/dL   BUN 12 6 - 20 mg/dL   Creatinine, Ser 7.82 0.44 - 1.00 mg/dL   Calcium 8.9 8.9 - 95.6 mg/dL   Total Protein 7.0 6.5 - 8.1 g/dL   Albumin 3.0 (L) 3.5 - 5.0 g/dL   AST 16 15 - 41 U/L   ALT 13 (L) 14 - 54 U/L   Alkaline Phosphatase 171 (H) 38 - 126 U/L   Total Bilirubin 0.2 (L) 0.3 - 1.2 mg/dL   GFR calc non  Af Amer >60 >60 mL/min   GFR calc Af Amer >60 >60 mL/min   Anion gap 9 5 - 15  Uric acid     Status: None   Collection Time: 04/19/15  4:30 PM  Result Value Ref Range   Uric Acid, Serum 5.7 2.3 - 6.6 mg/dL     Plan: -Discussed need to follow up in office  - come to MAU  on on Saturday BP and 24 urine -Bleeding and PTL Precautions -Encouraged to call if any questions or concerns arise prior to next scheduled office visit.  -Discharged to home in stable condition Consulted with Dr. Burna Forts Towanda Hornstein, CNM, MSN 04/19/2015. 5:56 PM

## 2015-04-20 LAB — URINE CULTURE

## 2015-04-22 NOTE — L&D Delivery Note (Signed)
Delivery Note 0113: Nurse call reports patient s/p epidural and c/o rectal pressure.  In room to assess and membranes at introitus.  AROM of moderate amt clear fluid.  Patient instructed on pushing methods and delivered as below.   At 1:33 AM, on Apr 26, 2015, a viable female, name unknown, was delivered via Vaginal, Spontaneous Delivery (Presentation: Right Occiput Anterior). Shoulders delivered easily and infant with good tone and spontaneous cry. Tactile stimulation given by provider and infant placed on mother's abdomen, after ~3 minutes as mother initially refused to hold infant.  Nurse continued tactile stimulation and infant APGAR: 9, 9. Cord clamped, cut, and blood collected. Placenta delivered spontaneously and noted to be intact with 3VC upon inspection.  Placenta to pathology for preterm delivery and maternal history of fever of unknown origin during pregnancy.  Vaginal inspection revealed no lacerations.  Fundus firm, at the umbilicus, and bleeding small.  Mother hemodynamically stable and infant skin to skin prior to provider exit.  Mother desires depo and Mirena for birth control method and opts to breastfeed and supplement.  Family wishes for infant to be circumcised in outpatient setting. Infant weight at one hour of life: 5 lb 6.8 oz (2460 g).    Anesthesia: Epidural  Episiotomy: None Lacerations: None Suture Repair: None Est. Blood Loss (mL): 100  Mom to postpartum.  Baby to Couplet care / Skin to Skin.  Cherre RobinsJessica L Kelliann Pendergraph MSN, CNM 04/26/2015, 2:20 AM

## 2015-04-25 ENCOUNTER — Encounter (HOSPITAL_COMMUNITY): Payer: Self-pay | Admitting: *Deleted

## 2015-04-25 ENCOUNTER — Inpatient Hospital Stay (HOSPITAL_COMMUNITY)
Admission: AD | Admit: 2015-04-25 | Discharge: 2015-04-28 | DRG: 775 | Disposition: A | Discharge status: Home / Self Care | Payer: BLUE CROSS/BLUE SHIELD | Source: Ambulatory Visit | Attending: Obstetrics and Gynecology | Admitting: Obstetrics and Gynecology

## 2015-04-25 ENCOUNTER — Inpatient Hospital Stay (HOSPITAL_COMMUNITY)
Admission: AD | Admit: 2015-04-25 | Discharge: 2015-04-25 | Disposition: A | Discharge status: Home / Self Care | Payer: BLUE CROSS/BLUE SHIELD | Source: Ambulatory Visit | Attending: Obstetrics and Gynecology | Admitting: Obstetrics and Gynecology

## 2015-04-25 DIAGNOSIS — R0602 Shortness of breath: Secondary | ICD-10-CM

## 2015-04-25 DIAGNOSIS — Z833 Family history of diabetes mellitus: Secondary | ICD-10-CM

## 2015-04-25 DIAGNOSIS — B951 Streptococcus, group B, as the cause of diseases classified elsewhere: Secondary | ICD-10-CM | POA: Diagnosis present

## 2015-04-25 DIAGNOSIS — Z87891 Personal history of nicotine dependence: Secondary | ICD-10-CM

## 2015-04-25 DIAGNOSIS — O99824 Streptococcus B carrier state complicating childbirth: Secondary | ICD-10-CM | POA: Diagnosis present

## 2015-04-25 DIAGNOSIS — IMO0001 Reserved for inherently not codable concepts without codable children: Secondary | ICD-10-CM

## 2015-04-25 DIAGNOSIS — Z3A36 36 weeks gestation of pregnancy: Secondary | ICD-10-CM

## 2015-04-25 HISTORY — DX: Headache: R51

## 2015-04-25 HISTORY — DX: Headache, unspecified: R51.9

## 2015-04-25 HISTORY — DX: Herpesviral infection of urogenital system, unspecified: A60.00

## 2015-04-25 LAB — URINALYSIS, ROUTINE W REFLEX MICROSCOPIC
Bilirubin Urine: NEGATIVE
Glucose, UA: NEGATIVE mg/dL
Hgb urine dipstick: NEGATIVE
Ketones, ur: NEGATIVE mg/dL
Nitrite: NEGATIVE
Protein, ur: NEGATIVE mg/dL
Specific Gravity, Urine: 1.02 (ref 1.005–1.030)
pH: 6 (ref 5.0–8.0)

## 2015-04-25 LAB — URINE MICROSCOPIC-ADD ON: RBC / HPF: NONE SEEN RBC/hpf (ref 0–5)

## 2015-04-25 NOTE — MAU Note (Signed)
Feeling a lot of pain/pressure in lower abd.  Comes and goes, has become regular, abd gets real tight. No bleeding or leaking. Just mucous. Has been feeling short of breath the last couple days.

## 2015-04-25 NOTE — Discharge Instructions (Signed)

## 2015-04-25 NOTE — MAU Provider Note (Signed)
Tara Sanchez  [id number] Continuation and completion of V. Emilee HeroLatham, CNM Note  S: Care assumed of 24.[id number] at 36.1wks who presents for contractions and SOB. Patient reports resolution of SOB.  Expresses concern regarding labor and when to report to hospital.  Further expresses frustrations with some providers as well as weight gain throughout pregnancy. Also requests suggestions for inducing labor.   Ceasar Mons:  Filed Vitals:   04/25/15 2010 04/25/15 2014  BP:  147/83  Pulse:  67  Temp: 98.1 F (36.7 C)   Resp: 18    Results for orders placed or performed during the hospital encounter of 04/25/15 (from the past 24 hour(s))  Urinalysis, Routine w reflex microscopic (not at [facility])     Status: Abnormal   Collection Time: 04/25/15  6:20 PM  Result Value Ref Range   Color, Urine YELLOW YELLOW   APPearance CLEAR CLEAR   Specific Gravity, Urine 1.020 1.005 - 1.030   pH 6.0 5.0 - 8.0   Glucose, UA NEGATIVE NEGATIVE mg/dL   Hgb urine dipstick NEGATIVE NEGATIVE   Bilirubin Urine NEGATIVE NEGATIVE   Ketones, ur NEGATIVE NEGATIVE mg/dL   Protein, ur NEGATIVE NEGATIVE mg/dL   Nitrite NEGATIVE NEGATIVE   Leukocytes, UA SMALL (A) NEGATIVE  Urine microscopic-add on     Status: Abnormal   Collection Time: 04/25/15  6:20 PM  Result Value Ref Range   Squamous Epithelial / LPF 0-5 (A) NONE SEEN   WBC, UA 6-30 0 - 5 WBC/hpf   RBC / HPF NONE SEEN 0 - 5 RBC/hpf   Bacteria, UA RARE (A) NONE SEEN  FHR: 135 bpm, Mod Var, -Decels, +Accels TOCO: Irregular Contractions   A: IUP at 36.1wks Cat I FT Contractions  P: -Encouraged to call when contractions start to get regular, SROM, VB, or DFM. -Discussed availability of providers, via phone, for questions regarding contraction pattern.  Encouraged to call, immediately, when regularity noted to avoid delay in care -Informed of [facility] policy to decline care of undesired providers and requests MD presence for delivery.  Further informed that if MD presence is  not desired, patient would be transferred to in hospital care-Faculty practice -Discussed methods for inducing labor and providers beliefs behind these methods.  Educated on prostaglandins and sperm. -Reassured that weight gain is required for pregnancy, but further informed of current gain of 34lbs which is excessive.  Encouraged healthier meal choices. -No other questions or concerns -Keep appt as scheduled -Encouraged to call if any questions or concerns arise prior to next scheduled office visit.  -Discharged to home in improved condition  Cherre RobinsJessica L Glendene Wyer MSN, CNM 04/25/2015 8:56 PM

## 2015-04-25 NOTE — MAU Provider Note (Signed)
History   25 yo [id number] presented after calling with SOB, "like I have been exercising", and lower abdominal cramping.  Seen in office today for regular visit, with no issues.  Cervix 4 cm there (no change from exam at prior visits).  Reports onset of sx after that visit.  Denies chest pain or pressure, no feeling of inability to take a breath.  Denies asthma sx or hx, fever, cough, leaking, bleeding, or any other issue.  Does have some issues with anxiety.  Recently started Valtrex for active HSV noted  Hx 31 week delivery--declined 17P during pregnancy.  Patient Active Problem List   Diagnosis Date Noted  . Threatened preterm labor 04/04/2015  . Preterm labor 03/28/2015  . Viral disease during pregnancy in second trimester, antepartum 01/29/2015  . Fever and chills 01/25/2015  . Preterm delivery--hx 31 week delivery 01/09/2015  . H/O 2 abortions 01/09/2015  . Maternal varicella, non-immune 01/09/2015  . Allergy history, drug--Phergan, Beckie Saltsessalon Pearles 01/09/2015    Chief Complaint  Patient presents with  . Abdominal Pain  . Shortness of Breath   HPI: as above  OB History    Gravida Para Term Preterm AB TAB SAB Ectopic Multiple Living   4 1 0 1 2 2    0 1      Past Medical History  Diagnosis Date  . Infection     UTI  . Headache   . Genital herpes     Past Surgical History  Procedure Laterality Date  . Therapeutic abortion      Family History  Problem Relation Age of Onset  . Asthma Neg Hx   . Cancer Neg Hx   . Heart disease Neg Hx   . Hypertension Neg Hx   . Stroke Neg Hx   . Alcohol abuse Neg Hx   . Arthritis Neg Hx   . Birth defects Neg Hx   . COPD Neg Hx   . Depression Neg Hx   . Drug abuse Neg Hx   . Early death Neg Hx   . Hearing loss Neg Hx   . Hyperlipidemia Neg Hx   . Kidney disease Neg Hx   . Learning disabilities Neg Hx   . Mental illness Neg Hx   . Mental retardation Neg Hx   . Miscarriages / Stillbirths Neg Hx   . Vision loss Neg Hx    . Varicose Veins Neg Hx   . Diabetes Paternal Grandmother     Social History  Substance Use Topics  . Smoking status: Former Smoker -- 2 years    Types: Cigars  . Smokeless tobacco: Never Used  . Alcohol Use: No    Allergies:  Allergies  Allergen Reactions  . Macrobid [Nitrofurantoin] Anaphylaxis  . Phenergan [Promethazine Hcl] Other (See Comments)    Reaction:  Severe sedation  . Tessalon [Benzonatate] Other (See Comments)    Reaction:  Severe sedation    No prescriptions prior to admission    ROS:  SOB, lower abdominal cramping, +FM Physical Exam   Blood pressure 147/83, pulse 67, temperature 98.1 F (36.7 C), temperature source Oral, resp. rate 18, height 5\' 7"  (1.702 m), weight 88.905 kg (196 lb), last menstrual period 07/23/2014, SpO2 99 %.    Physical Exam  In NAD, but slightly breathless--rapid speech and anxious affect. Chest clear Heart RRR, with I-II/IV systolic murmur Abd gravid, NT Pelvic--3-4 cm, 70%, vtx, -2 (no change from office exam) Ext DTR 1+, no clonus, negative Homan's, negative edema  FHR  Category 1 Occasional mild UCs  ED Course  Assessment: IUP at 36 1/7 weeks SOB Lower abdominal pain ? Anxiety component  Plan: Monitor for UCs. CBC, diff, CMP Gerrit Heck, CNM, will f/u on patient status.   Nigel Bridgeman CNM, MSN 04/25/2015 7p

## 2015-04-26 ENCOUNTER — Encounter (HOSPITAL_COMMUNITY): Payer: Self-pay | Admitting: *Deleted

## 2015-04-26 ENCOUNTER — Inpatient Hospital Stay (HOSPITAL_COMMUNITY): Payer: BLUE CROSS/BLUE SHIELD | Admitting: Anesthesiology

## 2015-04-26 DIAGNOSIS — IMO0001 Reserved for inherently not codable concepts without codable children: Secondary | ICD-10-CM

## 2015-04-26 DIAGNOSIS — Z87891 Personal history of nicotine dependence: Secondary | ICD-10-CM | POA: Diagnosis not present

## 2015-04-26 DIAGNOSIS — Z833 Family history of diabetes mellitus: Secondary | ICD-10-CM | POA: Diagnosis not present

## 2015-04-26 DIAGNOSIS — Z3A36 36 weeks gestation of pregnancy: Secondary | ICD-10-CM | POA: Diagnosis not present

## 2015-04-26 DIAGNOSIS — O99824 Streptococcus B carrier state complicating childbirth: Secondary | ICD-10-CM | POA: Diagnosis present

## 2015-04-26 LAB — RPR: RPR Ser Ql: NONREACTIVE

## 2015-04-26 LAB — COMPREHENSIVE METABOLIC PANEL
ALT: 16 U/L (ref 14–54)
AST: 21 U/L (ref 15–41)
Albumin: 2.5 g/dL — ABNORMAL LOW (ref 3.5–5.0)
Alkaline Phosphatase: 151 U/L — ABNORMAL HIGH (ref 38–126)
Anion gap: 5 (ref 5–15)
BUN: 8 mg/dL (ref 6–20)
CHLORIDE: 105 mmol/L (ref 101–111)
CO2: 26 mmol/L (ref 22–32)
CREATININE: 0.65 mg/dL (ref 0.44–1.00)
Calcium: 8.7 mg/dL — ABNORMAL LOW (ref 8.9–10.3)
Glucose, Bld: 99 mg/dL (ref 65–99)
POTASSIUM: 3.9 mmol/L (ref 3.5–5.1)
SODIUM: 136 mmol/L (ref 135–145)
Total Bilirubin: 0.6 mg/dL (ref 0.3–1.2)
Total Protein: 6 g/dL — ABNORMAL LOW (ref 6.5–8.1)

## 2015-04-26 LAB — URIC ACID: URIC ACID, SERUM: 6.3 mg/dL (ref 2.3–6.6)

## 2015-04-26 LAB — CBC
HEMATOCRIT: 33.7 % — AB (ref 36.0–46.0)
HEMATOCRIT: 35.3 % — AB (ref 36.0–46.0)
HEMATOCRIT: 37.7 % (ref 36.0–46.0)
HEMOGLOBIN: 11.3 g/dL — AB (ref 12.0–15.0)
Hemoglobin: 12 g/dL (ref 12.0–15.0)
Hemoglobin: 12.9 g/dL (ref 12.0–15.0)
MCH: 30.2 pg (ref 26.0–34.0)
MCH: 30.2 pg (ref 26.0–34.0)
MCH: 30.5 pg (ref 26.0–34.0)
MCHC: 33.5 g/dL (ref 30.0–36.0)
MCHC: 34 g/dL (ref 30.0–36.0)
MCHC: 34.2 g/dL (ref 30.0–36.0)
MCV: 88.9 fL (ref 78.0–100.0)
MCV: 89.1 fL (ref 78.0–100.0)
MCV: 90.1 fL (ref 78.0–100.0)
PLATELETS: 205 10*3/uL (ref 150–400)
Platelets: 191 10*3/uL (ref 150–400)
Platelets: 175 10*3/uL (ref 150–400)
RBC: 3.74 MIL/uL — AB (ref 3.87–5.11)
RBC: 3.97 MIL/uL (ref 3.87–5.11)
RBC: 4.23 MIL/uL (ref 3.87–5.11)
RDW: 13.9 % (ref 11.5–15.5)
RDW: 13.9 % (ref 11.5–15.5)
RDW: 14 % (ref 11.5–15.5)
WBC: 15.3 10*3/uL — ABNORMAL HIGH (ref 4.0–10.5)
WBC: 16.2 10*3/uL — ABNORMAL HIGH (ref 4.0–10.5)
WBC: 23.5 10*3/uL — AB (ref 4.0–10.5)

## 2015-04-26 LAB — LACTATE DEHYDROGENASE: LDH: 187 U/L (ref 98–192)

## 2015-04-26 LAB — TYPE AND SCREEN
ABO/RH(D): B POS
Antibody Screen: NEGATIVE

## 2015-04-26 LAB — PROTEIN / CREATININE RATIO, URINE
Creatinine, Urine: 129 mg/dL
PROTEIN CREATININE RATIO: 0.09 mg/mg{creat} (ref 0.00–0.15)
TOTAL PROTEIN, URINE: 12 mg/dL

## 2015-04-26 LAB — RAPID HIV SCREEN (HIV 1/2 AB+AG)
HIV 1/2 ANTIBODIES: NONREACTIVE
HIV-1 P24 Antigen - HIV24: NONREACTIVE

## 2015-04-26 MED ORDER — CITRIC ACID-SODIUM CITRATE 334-500 MG/5ML PO SOLN: 30.0000 mL | ORAL | Status: DC | PRN

## 2015-04-26 MED ORDER — ONDANSETRON HCL 4 MG PO TABS: 4.0000 mg | ORAL_TABLET | ORAL | Status: DC | PRN

## 2015-04-26 MED ORDER — PRENATAL MULTIVITAMIN CH
1.0000 | ORAL_TABLET | Freq: Every day | ORAL | Status: DC
  Administered 2015-04-26 – 2015-04-28 (×3): 1 via ORAL
  Filled 2015-04-26 (×3): qty 1

## 2015-04-26 MED ORDER — FENTANYL 2.5 MCG/ML BUPIVACAINE 1/10 % EPIDURAL INFUSION (WH - ANES)
14.0000 mL/h | INTRAMUSCULAR | Status: DC | PRN
  Administered 2015-04-26: 14 mL/h via EPIDURAL

## 2015-04-26 MED ORDER — INFLUENZA VAC SPLIT QUAD 0.5 ML IM SUSY: 0.5000 mL | PREFILLED_SYRINGE | INTRAMUSCULAR | Status: DC

## 2015-04-26 MED ORDER — MEDROXYPROGESTERONE ACETATE 150 MG/ML IM SUSP: 150.0000 mg | INTRAMUSCULAR | Status: DC | PRN

## 2015-04-26 MED ORDER — OXYTOCIN BOLUS FROM INFUSION
500.0000 mL | INTRAVENOUS | Status: DC
  Administered 2015-04-26: 500 mL via INTRAVENOUS

## 2015-04-26 MED ORDER — EPHEDRINE 5 MG/ML INJ
10.0000 mg | INTRAVENOUS | Status: DC | PRN
Start: 2015-04-26 — End: 2015-04-26
  Filled 2015-04-26: qty 2

## 2015-04-26 MED ORDER — DIPHENHYDRAMINE HCL 25 MG PO CAPS: 25.0000 mg | ORAL_CAPSULE | Freq: Four times a day (QID) | ORAL | Status: DC | PRN

## 2015-04-26 MED ORDER — SODIUM CHLORIDE 0.9 % IV SOLN
2.0000 g | Freq: Once | INTRAVENOUS | Status: AC
  Administered 2015-04-26: 2 g via INTRAVENOUS
  Filled 2015-04-26: qty 2000

## 2015-04-26 MED ORDER — IBUPROFEN 600 MG PO TABS
600.0000 mg | ORAL_TABLET | Freq: Four times a day (QID) | ORAL | Status: DC
  Administered 2015-04-26 – 2015-04-28 (×9): 600 mg via ORAL
  Filled 2015-04-26 (×10): qty 1

## 2015-04-26 MED ORDER — WITCH HAZEL-GLYCERIN EX PADS: 1.0000 "application " | MEDICATED_PAD | CUTANEOUS | Status: DC | PRN

## 2015-04-26 MED ORDER — ONDANSETRON HCL 4 MG/2ML IJ SOLN: 4.0000 mg | Freq: Four times a day (QID) | INTRAMUSCULAR | Status: DC | PRN

## 2015-04-26 MED ORDER — SIMETHICONE 80 MG PO CHEW: 80.0000 mg | CHEWABLE_TABLET | ORAL | Status: DC | PRN

## 2015-04-26 MED ORDER — BENZOCAINE-MENTHOL 20-0.5 % EX AERO: 1.0000 "application " | INHALATION_SPRAY | CUTANEOUS | Status: DC | PRN

## 2015-04-26 MED ORDER — ZOLPIDEM TARTRATE 5 MG PO TABS: 5.0000 mg | ORAL_TABLET | Freq: Every evening | ORAL | Status: DC | PRN

## 2015-04-26 MED ORDER — ONDANSETRON HCL 4 MG/2ML IJ SOLN: 4.0000 mg | INTRAMUSCULAR | Status: DC | PRN

## 2015-04-26 MED ORDER — LACTATED RINGERS IV SOLN
INTRAVENOUS | Status: DC
  Administered 2015-04-26: via INTRAVENOUS

## 2015-04-26 MED ORDER — SENNOSIDES-DOCUSATE SODIUM 8.6-50 MG PO TABS
2.0000 | ORAL_TABLET | ORAL | Status: DC
  Administered 2015-04-28: 2 via ORAL
  Filled 2015-04-26 (×3): qty 2

## 2015-04-26 MED ORDER — ACETAMINOPHEN 325 MG PO TABS: 650.0000 mg | ORAL_TABLET | ORAL | Status: DC | PRN

## 2015-04-26 MED ORDER — OXYCODONE-ACETAMINOPHEN 5-325 MG PO TABS: 2.0000 | ORAL_TABLET | ORAL | Status: DC | PRN

## 2015-04-26 MED ORDER — PHENYLEPHRINE 40 MCG/ML (10ML) SYRINGE FOR IV PUSH (FOR BLOOD PRESSURE SUPPORT)
PREFILLED_SYRINGE | INTRAVENOUS | Status: DC
Start: 2015-04-26 — End: 2015-04-26
  Filled 2015-04-26: qty 20

## 2015-04-26 MED ORDER — OXYCODONE-ACETAMINOPHEN 5-325 MG PO TABS: 1.0000 | ORAL_TABLET | ORAL | Status: DC | PRN

## 2015-04-26 MED ORDER — FENTANYL 2.5 MCG/ML BUPIVACAINE 1/10 % EPIDURAL INFUSION (WH - ANES)
INTRAMUSCULAR | Status: AC
  Filled 2015-04-26: qty 125

## 2015-04-26 MED ORDER — LACTATED RINGERS IV SOLN: 500.0000 mL | INTRAVENOUS | Status: DC | PRN

## 2015-04-26 MED ORDER — DIPHENHYDRAMINE HCL 50 MG/ML IJ SOLN: 12.5000 mg | INTRAMUSCULAR | Status: DC | PRN

## 2015-04-26 MED ORDER — LANOLIN HYDROUS EX OINT: TOPICAL_OINTMENT | CUTANEOUS | Status: DC | PRN

## 2015-04-26 MED ORDER — LIDOCAINE HCL (PF) 1 % IJ SOLN
30.0000 mL | INTRAMUSCULAR | Status: DC | PRN
  Filled 2015-04-26: qty 30

## 2015-04-26 MED ORDER — TETANUS-DIPHTH-ACELL PERTUSSIS 5-2.5-18.5 LF-MCG/0.5 IM SUSP: 0.5000 mL | Freq: Once | INTRAMUSCULAR | Status: DC

## 2015-04-26 MED ORDER — PHENYLEPHRINE 40 MCG/ML (10ML) SYRINGE FOR IV PUSH (FOR BLOOD PRESSURE SUPPORT)
80.0000 ug | PREFILLED_SYRINGE | INTRAVENOUS | Status: DC | PRN
  Filled 2015-04-26: qty 2

## 2015-04-26 MED ORDER — DIBUCAINE 1 % RE OINT: 1.0000 "application " | TOPICAL_OINTMENT | RECTAL | Status: DC | PRN

## 2015-04-26 MED ORDER — OXYTOCIN 10 UNIT/ML IJ SOLN
2.5000 [IU]/h | INTRAVENOUS | Status: DC
  Administered 2015-04-26: 2.5 [IU]/h via INTRAVENOUS
  Filled 2015-04-26: qty 4

## 2015-04-26 MED ORDER — LIDOCAINE HCL (PF) 1 % IJ SOLN
INTRAMUSCULAR | Status: DC | PRN
  Administered 2015-04-26 (×2): 4 mL

## 2015-04-26 MED ORDER — FENTANYL CITRATE (PF) 100 MCG/2ML IJ SOLN
50.0000 ug | INTRAMUSCULAR | Status: DC | PRN
Start: 2015-04-26 — End: 2015-04-26

## 2015-04-26 NOTE — Lactation Note (Signed)
This note was copied from the chart of Tara [employer] Wh[facility]ler. Lactation Consultation Note  P2. First child was born at 5531 weeks in NICU.  Mother pumped for 6-7 months.  Baby 5274w2d and 7 hours old. Mother hand expressed w/ [name] before entering.   Baby was unlatched from football hold upon entering and mother was unsure whether to continue to breastfeed him. Showed mother how baby was cueing and encouraged her to re-latch baby. Mother needed some help w/ latching and encouragement.  She seems unsure of how to feed baby. Baby latches easily.  Sucks and some swallows observed.  Suggest she breastfeed for 20 min, both breasts if interested and then give formula afterwards. Mother needed help w/ bottle feeding.  She states her mother usually steps in and bottle fed her other child. Showed her how to pace feed, allow baby to take breaks during bottle feeding and watch for cues if he is still hungry. Reminded mother to pump for 10-15 min afterwards.  Discussed milk storage, cleaning and spoon feeding. Mother states she has already pumped but did not receive anything.  Encouraged her to continue pumping 4--6x day. Explained the importance of breast stimulation and praised her for her efforts.  Patient Name: Tara Sanchez ZOXWR'UToday's Date: 04/26/2015 Reason for consult: Late preterm infant;Initial assessment   Maternal Data Has patient been taught Hand Expression?: Yes Does the patient have breastfeeding experience prior to this delivery?: Yes  Feeding Feeding Type: Breast Fed Length of feed: 20 min  LATCH Score/Interventions Latch: Grasps breast easily, tongue down, lips flanged, rhythmical sucking.  Audible Swallowing: None Intervention(s): Skin to skin;Hand expression  Type of Nipple: Everted at rest and after stimulation  Comfort (Breast/Nipple): Soft / non-tender     Hold (Positioning): Assistance needed to correctly position infant at breast and maintain latch.  LATCH Score:  7  Lactation Tools Discussed/Used Pump Review: Setup, frequency, and cleaning;Milk Storage Date initiated:: 04/26/15   Consult Status Consult Status: Follow-up Date: 04/27/15 Follow-up type: In-patient    Dahlia ByesBerkelhammer, Ellissa Ayo [facility] 04/26/2015, 10:06 AM

## 2015-04-26 NOTE — Anesthesia Procedure Notes (Signed)
Epidural Patient location during procedure: OB  Staffing Anesthesiologist: Shaft Corigliano Performed by: anesthesiologist   Preanesthetic Checklist Completed: patient identified, site marked, surgical consent, pre-op evaluation, timeout performed, IV checked, risks and benefits discussed and monitors and equipment checked  Epidural Patient position: sitting Prep: site prepped and draped and DuraPrep Patient monitoring: continuous pulse ox and blood pressure Approach: midline Location: L3-L4 Injection technique: LOR saline  Needle:  Needle type: Tuohy  Needle gauge: 17 G Needle length: 9 cm and 9 Needle insertion depth: 7 cm Catheter type: closed end flexible Catheter size: 19 Gauge Catheter at skin depth: 12 cm Test dose: negative  Assessment Events: blood not aspirated, injection not painful, no injection resistance, negative IV test and no paresthesia  Additional Notes Patient identified. Risks/Benefits/Options discussed with patient including but not limited to bleeding, infection, nerve damage, paralysis, failed block, incomplete pain control, headache, blood pressure changes, nausea, vomiting, reactions to medication both or allergic, itching and postpartum back pain. Confirmed with bedside nurse the patient's most recent platelet count. Confirmed with patient that they are not currently taking any anticoagulation, have any bleeding history or any family history of bleeding disorders. Patient expressed understanding and wished to proceed. All questions were answered. Sterile technique was used throughout the entire procedure. Please see nursing notes for vital signs. Test dose was given through epidural catheter and negative prior to continuing to dose epidural or start infusion. Warning signs of high block given to the patient including shortness of breath, tingling/numbness in hands, complete motor block, or any concerning symptoms with instructions to call for help. Patient was  given instructions on fall risk and not to get out of bed. All questions and concerns addressed with instructions to call with any issues or inadequate analgesia.      

## 2015-04-26 NOTE — Progress Notes (Signed)
Subjective: Postpartum Day 0, Vaginal delivery, No laceration Patient up ad lib, reports no syncope or dizziness. Denies HA, visual disturbances, and Epigastric pain Feeding:  Breastfeeding and supplement Contraceptive plan:  Depo at discharge  and Mirena at 726 weeks Female infant- undecided on name " Jace or [facility]"  Desires infant circumcision- outpatient  Objective: Vital signs in last 24 hours: Temp:  [97.7 F (36.5 C)-98.5 F (36.9 C)] 98.5 F (36.9 C) (01/05 0502) Pulse Rate:  [59-76] 59 (01/05 0502) Resp:  [16-22] 16 (01/05 0502) BP: (127-149)/(69-87) 149/87 mmHg (01/05 0502) SpO2:  [98 %-100 %] 99 % (01/04 1907) Weight:  [88.905 kg (196 lb)-89.268 kg (196 lb 12.8 oz)] 88.905 kg (196 lb) (01/05 0036)  Physical Exam:  General: alert and cooperative Lochia: appropriate Uterine Fundus: firm Perineum: healing well Extremities: Trace lower extremity edema, DVT Evaluation:   No evidence of DVT seen on physical exam.   CBC Latest Ref Rng 04/26/2015 04/26/2015 04/19/2015  WBC 4.0 - 10.5 K/uL 23.5(H) 15.3(H) 12.0(H)  Hemoglobin 12.0 - 15.0 g/dL 82.912.0 56.212.9 13.013.4  Hematocrit 36.0 - 46.0 % 35.3(L) 37.7 39.2  Platelets 150 - 400 K/uL 191 205 218    Assessment/Plan: Status post vaginal delivery day 0. Breast and Bottlefeeding Elevated BP-  continue to watch BP  Stable Lactation consult  Desires outpatient circumcision Contraception: depo provera at discharge and Mirena postpartum Continue current care.  Beatrix FettersRachel StallCNM 04/26/2015, 9:13 AM

## 2015-04-26 NOTE — Progress Notes (Signed)
Clean catch urine sent to lab per order.

## 2015-04-26 NOTE — Anesthesia Postprocedure Evaluation (Signed)
Anesthesia Post Note  Patient: Tara Sanchez  Procedure(s) Performed: * No procedures listed *  Patient location during evaluation: Mother Baby Anesthesia Type: Epidural Level of consciousness: awake, awake and alert, oriented and patient cooperative Pain management: pain level controlled Vital Signs Assessment: post-procedure vital signs reviewed and stable Respiratory status: spontaneous breathing, nonlabored ventilation and respiratory function stable Cardiovascular status: stable Postop Assessment: no headache, no backache, patient able to bend at knees and no signs of nausea or vomiting Anesthetic complications: no    Last Vitals:  Filed Vitals:   04/26/15 0400 04/26/15 0502  BP: 134/82 149/87  Pulse: 60 59  Temp:  36.9 C  Resp: 20 16    Last Pain:  Filed Vitals:   04/26/15 0637  PainSc: 0-No pain                 Ercia Crisafulli L

## 2015-04-26 NOTE — H&P (Signed)
[name] is a 25 y.o. female, [phone] at 36.2 weeks, presenting for contractions.  Patient seen in MAU earlier this evening for contractions and SOB, but was sent home in stable condition.  Patient reports contractions started at 2111 and have been increasingly more frequent and painful.  Patient denies LOF and VB.  Patient desires an epidural and is GBS positive.  Patient with extensive PN history including hx of PTL with 2011 pregnancy, HSV diagnosis, fever of unknown origin, PTL, and non-compliance.   Patient Active Problem List   Diagnosis Date Noted  . Active labor at term 04/26/2015  . Positive GBS test 04/25/2015  . Threatened preterm labor 04/04/2015  . Preterm labor 03/28/2015  . Viral disease during pregnancy in second trimester, antepartum 01/29/2015  . Fever and chills 01/25/2015  . Preterm delivery--hx 31 week delivery 01/09/2015  . H/O 2 abortions 01/09/2015  . Maternal varicella, non-immune 01/09/2015  . Allergy history, drug--Phergan, Tessalon Pearles 01/09/2015    History of present pregnancy: Patient entered care at 6.1 weeks.  EDC of 09/27/2014 was established by 6.1wk on 09/27/2014.  Anatomy scan: 18.2 weeks, with normal findings and an posterior placenta.  Additional US evaluations:  6.1wks: EDC CHANGED 05/22/2015 4CM CYST SEEN  18.2wks: U/S: Singleton, vertex, placenta posterior, cervix closed. Fluid normal Profile/palate, nasal bone Philtrum seen Open hands, 5th digit and heel and feet seen Significant prenatal events: 1st Trimester: Seen for viability US, didn't return to care until 15wks 2nd Trimester: Patient treated for UTI after c/o abdominal pain. Treated for trichomoniasis. Diagnosed with HSV via vaginal culture. Seen and admitted for persistent fever. 3rd Trimester: C/O acne and greenish Tara/c.  Seen and admitted for PTL at 32wks.  However, patient declined 17p, MgS04 and procardia, but accepted BMZ course.  Patient fell on abdomen at 33 wks, but  declined evaluation.  Non compliance with recommendation for OOW and completion of 24 hr urine  Last evaluation:  Dr. Dorris CarnesN. Sanchez in office on 04/25/2015  FHR 156, VE: 4/60/-3, BP 122/78, Wt 195lbs TWG: 34lbs  OB History    Gravida Para Term Preterm AB TAB SAB Ectopic Multiple Living   4 1 0 1 2 2    0 1     Past Medical History  Diagnosis Date  . Infection     UTI  . Headache   . Genital herpes    Past Surgical History  Procedure Laterality Date  . Therapeutic abortion     Family History: family history includes Diabetes in her paternal grandmother. There is no history of Asthma, Cancer, Heart disease, Hypertension, Stroke, Alcohol abuse, Arthritis, Birth defects, COPD, Depression, Drug abuse, Early death, Hearing loss, Hyperlipidemia, Kidney disease, Learning disabilities, Mental illness, Mental retardation, Miscarriages / Stillbirths, Vision loss, or Varicose Veins. Social History:  reports that she has quit smoking. Her smoking use included Cigars. She has never used smokeless tobacco. She reports that she does not drink alcohol or use illicit drugs.  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  FOB is uninvolved, but boyfriend, Tara CheshireBilly, is involved and present.   Prenatal Transfer Tool  Maternal Diabetes: No Genetic Screening: Normal Maternal Ultrasounds/Referrals: Normal Fetal Ultrasounds or other Referrals:  None Maternal Substance Abuse:  No Significant Maternal Medications:  Meds include: Other: Valtrex, S/P BMZ Course Significant Maternal Lab Results: Lab values include: Group B Strep positive    ROS:  +ctx, -lof, -vb, +fm  Allergies  Allergen Reactions  . Macrobid [Nitrofurantoin] Anaphylaxis  . Phenergan [  Promethazine Hcl] Other (See Comments)    Reaction:  Severe sedation  . Tessalon [Benzonatate] Other (See Comments)    Reaction:  Severe sedation     Dilation: 5.5 Effacement (%): 80 Station: -1 Exam by:: Tara Simpson RN Last menstrual period  07/23/2014.  Physical Exam  Vitals reviewed. Constitutional: She is oriented to person, place, and time. She appears well-developed and well-nourished. She appears distressed.  HENT:  Head: Normocephalic and atraumatic.  Mouth/Throat: Mucous membranes are dry.  Tongue appears black  Eyes: EOM are normal. Pupils are equal, round, and reactive to light.  Neck: Normal range of motion.  Cardiovascular: Normal rate, regular rhythm and normal heart sounds.   Respiratory: Effort normal and breath sounds normal.  GI: Soft. Bowel sounds are normal.  Musculoskeletal: Normal range of motion.  Neurological: She is alert and oriented to person, place, and time.  Skin: Skin is warm and dry.    Leopolds: EFW: 5 1/4-3/4 lbs Presentation: Vertex  FHR: 135 bpm, Mod Var, -Decels, +Accels UCs:  Q2-61min, palpates moderate  Prenatal labs: ABO, Rh: --/--/B POS, B POS (12/07 2145) Antibody: NEG (12/07 2145) Rubella:  Immune RPR: Nonreactive (06/07 0000)  HBsAg: Negative (06/07 0000)  HIV: Non-reactive (06/07 0000)  GBS: Positive (12/07 0000) Sickle cell/Hgb electrophoresis:  Normal Pap:  Unknown GC:  Negative Chlamydia:  Negative Other:      Assessment IUP at 36.2wks Cat I FT Active Labor GBS Positive  Plan: Admit to [facility]  Routine Labor and Delivery Orders per [facility] Protocol In room to complete assessment and discuss POC: Okay for epidural Ampicillin for GBS TaraAR to be updated as appropriate      Joellyn Quails, MSN 04/26/2015, 12:30 AM

## 2015-04-26 NOTE — Anesthesia Preprocedure Evaluation (Signed)
Anesthesia Evaluation  Patient identified by MRN, date of birth, ID band Patient awake    Reviewed: Allergy & Precautions, NPO status , Patient's Chart, lab work & pertinent test results  History of Anesthesia Complications Negative for: history of anesthetic complications  Airway Mallampati: II  TM Distance: >3 FB Neck ROM: Full    Dental no notable dental hx. (+) Dental Advisory Given   Pulmonary former smoker,    Pulmonary exam normal breath sounds clear to auscultation       Cardiovascular negative cardio ROS Normal cardiovascular exam Rhythm:Regular Rate:Normal     Neuro/Psych  Headaches, negative psych ROS   GI/Hepatic negative GI ROS, Neg liver ROS,   Endo/Other  obesity  Renal/GU negative Renal ROS  negative genitourinary   Musculoskeletal negative musculoskeletal ROS (+)   Abdominal   Peds negative pediatric ROS (+)  Hematology negative hematology ROS (+)   Anesthesia Other Findings   Reproductive/Obstetrics (+) Pregnancy                             Anesthesia Physical Anesthesia Plan  ASA: II  Anesthesia Plan: Epidural   Post-op Pain Management:    Induction:   Airway Management Planned:   Additional Equipment:   Intra-op Plan:   Post-operative Plan:   Informed Consent: I have reviewed the patients History and Physical, chart, labs and discussed the procedure including the risks, benefits and alternatives for the proposed anesthesia with the patient or authorized representative who has indicated his/her understanding and acceptance.   Dental advisory given  Plan Discussed with: CRNA  Anesthesia Plan Comments:         Anesthesia Quick Evaluation  

## 2015-04-27 MED ORDER — IBUPROFEN 600 MG PO TABS
600.0000 mg | ORAL_TABLET | Freq: Four times a day (QID) | ORAL | Status: DC
Start: 2015-04-27 — End: 2015-07-23

## 2015-04-27 MED ORDER — TETANUS-DIPHTH-ACELL PERTUSSIS 5-2.5-18.5 LF-MCG/0.5 IM SUSP
0.5000 mL | Freq: Once | INTRAMUSCULAR | Status: AC
  Administered 2015-04-27: 0.5 mL via INTRAMUSCULAR
  Filled 2015-04-27: qty 0.5

## 2015-04-27 NOTE — Lactation Note (Signed)
This note was copied from the chart of Tara Edison Internationalmber Detzel. Lactation Consultation Note LPI 5lbs 3oz. Breast/bottle. Mom sleeping soundly, told mom baby was cueing and time to BF. Mom stated she was going to BF in the morning and bottle feed tonight. Has DEBP at bedside. Mom stated she would pump in the morning, she was to tired tonight. Reminded mom supply and demand. Stated she knew but now she needed sleep, she was exhausted. Gave baby 10ml formula and burped. Baby had 4% wt. Loss. Encouraged mom to do STS, BF, then supplement w/formula. Mom stated she would tomorrow.  Patient Name: Tara Sanchez ZOXWR'UToday's Date: 04/27/2015 Reason for consult: Follow-up assessment;Infant < 6lbs;Late preterm infant   Maternal Data    Feeding Feeding Type: Formula Nipple Type: Slow - flow  LATCH Score/Interventions                      Lactation Tools Discussed/Used Tools: Pump;Bottle Breast pump type: Double-Electric Breast Pump   Consult Status Consult Status: Follow-up Date: 04/27/15 Follow-up type: In-patient    Charyl DancerCARVER, Mcguire Gasparyan G 04/27/2015, 2:44 AM

## 2015-04-27 NOTE — Discharge Summary (Addendum)
OB Discharge Summary  Patient Name: Tara Sanchez DOB: [birth year] MRN: [id number]  Date of admission: 04/25/2015 Delivering MD: Gerrit Heck   Date of discharge: 04/27/2015  Admitting diagnosis: 36 WEEKS CTX Intrauterine pregnancy: [redacted]w[redacted]d     Secondary diagnosis:Principal Problem:   SVD (spontaneous vaginal delivery) Active Problems:   Positive GBS test   Active labor at term   Preterm delivery, delivered  Additional problems:varicella non immune    Discharge diagnosis: Preterm Pregnancy Delivered                                                                     Post partum procedures:none  Augmentation: none  Complications: None  Hospital course:  Onset of Labor With Vaginal Delivery     25 y.o. yo [phone] at [redacted]w[redacted]d was admitted in Active Labor on 04/25/2015. Patient had an uncomplicated labor course as follows:  Membrane Rupture Time/Date: 1:19 AM ,04/26/2015   Intrapartum Procedures: Episiotomy: None [1]                                         Lacerations:  None [1]  Patient had a delivery of a Viable infant. 04/26/2015  Information for the patient's newborn:  Magaline, K[place] [[id number]]  Delivery Method: Vaginal, Spontaneous Delivery (Filed from Delivery Summary)    Pateint had an uncomplicated postpartum course.  She is ambulating, tolerating a regular diet, passing flatus, and urinating well. Patient is discharged home in stable condition on 04/27/2015.    Physical exam  Filed Vitals:   04/26/15 0900 04/26/15 1641 04/26/15 2002 04/27/15 0608  BP: 138/73 151/81 126/58 126/79  Pulse: 65 56 75 74  Temp: 97.8 F (36.6 C) 98.7 F (37.1 C) 98.9 F (37.2 C) 98.2 F (36.8 C)  TempSrc: Oral Oral Oral   Resp: 18 20 18 18   Height:      Weight:      SpO2: 99% 100% 99%    General: alert, cooperative and no distress Lochia: appropriate Uterine Fundus: soft Incision: Healing well with no significant drainage DVT Evaluation: No evidence of DVT seen on physical  exam. Labs: Lab Results  Component Value Date   WBC 16.2* 04/26/2015   HGB 11.3* 04/26/2015   HCT 33.7* 04/26/2015   MCV 90.1 04/26/2015   PLT 175 04/26/2015   CMP Latest Ref Rng 04/26/2015  Glucose 65 - 99 mg/dL 99  BUN 6 - 20 mg/dL 8  Creatinine 5.18 - 8.41 mg/dL 6.60  Sodium 630 - 160 mmol/L 136  Potassium 3.5 - 5.1 mmol/L 3.9  Chloride 101 - 111 mmol/L 105  CO2 22 - 32 mmol/L 26  Calcium 8.9 - 10.3 mg/dL 1.0(X)  Total Protein 6.5 - 8.1 g/dL 6.0(L)  Total Bilirubin 0.3 - 1.2 mg/dL 0.6  Alkaline Phos 38 - 126 U/L 151(H)  AST 15 - 41 U/L 21  ALT 14 - 54 U/L 16    Discharge instruction: per After Visit Summary and "Baby and Me Booklet".  Medications:  Current facility-administered medications:  .  acetaminophen (TYLENOL) tablet 650 mg, 650 mg, Oral, Q4H PRN, Gerrit Heck, CNM .  benzocaine-Menthol (DERMOPLAST)  20-0.5 % topical spray 1 application, 1 application, Topical, PRN, Gerrit Heck, CNM .  witch hazel-glycerin (TUCKS) pad 1 application, 1 application, Topical, PRN **AND** dibucaine (NUPERCAINAL) 1 % rectal ointment 1 application, 1 application, Rectal, PRN, Gerrit Heck, CNM .  diphenhydrAMINE (BENADRYL) capsule 25 mg, 25 mg, Oral, Q6H PRN, Gerrit Heck, CNM .  ibuprofen (ADVIL,MOTRIN) tablet 600 mg, 600 mg, Oral, 4 times per day, Gerrit Heck, CNM, 600 mg at 04/27/15 0546 .  lanolin ointment, , Topical, PRN, Gerrit Heck, CNM .  medroxyPROGESTERone (DEPO-PROVERA) injection 150 mg, 150 mg, Intramuscular, Prior to discharge, Gerrit Heck, CNM .  ondansetron (ZOFRAN) tablet 4 mg, 4 mg, Oral, Q4H PRN **OR** ondansetron (ZOFRAN) injection 4 mg, 4 mg, Intravenous, Q4H PRN, Gerrit Heck, CNM .  prenatal multivitamin tablet 1 tablet, 1 tablet, Oral, Q1200, Gerrit Heck, CNM, 1 tablet at 04/26/15 1159 .  senna-docusate (Senokot-S) tablet 2 tablet, 2 tablet, Oral, Q24H, Gerrit Heck, CNM, 2 tablet at 04/26/15 2350 .  simethicone (MYLICON) chewable tablet 80 mg, 80 mg, Oral, PRN,  Gerrit Heck, CNM .  zolpidem (AMBIEN) tablet 5 mg, 5 mg, Oral, QHS PRN, Gerrit Heck, CNM  Facility-Administered Medications Ordered in Other Encounters:  .  lidocaine (PF) (XYLOCAINE) 1 % injection, , , Anesthesia Intra-op, Karie Schwalbe, MD, 4 mL at 04/26/15 0103 After Visit Meds:    Medication List    ASK your doctor about these medications        NIFEdipine 10 MG capsule  Commonly known as:  PROCARDIA  Take 1 capsule (10 mg total) by mouth every 6 (six) hours as needed.     prenatal multivitamin Tabs tablet  Take 1 tablet by mouth daily.     valACYclovir 1000 MG tablet  Commonly known as:  VALTREX  Take 1 tablet by mouth daily.        Diet: routine diet  Activity: Advance as tolerated. Pelvic rest for 6 weeks.   Outpatient follow up:6 weeks Follow up Appt:No future appointments. Follow up visit: No Follow-up on file.  Postpartum contraception: Depo Provera and IUD Mirena  Newborn Data: Live born female  Birth Weight: 5 lb 6.8 oz (2460 g) APGAR: 9, 9  Baby Feeding: Breast Disposition:home with mother   04/28/15 Zilphia Kozinski, CNM      Postpartum Care After Vaginal Delivery  After you deliver your newborn (postpartum period), the usual stay in the hospital is 24 72 hours. If there were problems with your labor or delivery, or if you have other medical problems, you might be in the hospital longer.  While you are in the hospital, you will receive help and instructions on how to care for yourself and your newborn during the postpartum period.  While you are in the hospital:  Be sure to tell your nurses if you have pain or discomfort, as well as where you feel the pain and what makes the pain worse.  If you had an incision made near your vagina (episiotomy) or if you had some tearing during delivery, the nurses may put ice packs on your episiotomy or tear. The ice packs may help to reduce the pain and swelling.  If you are breastfeeding, you may feel  uncomfortable contractions of your uterus for a couple of weeks. This is normal. The contractions help your uterus get back to normal size.  It is normal to have some bleeding after delivery.  For the first 1 3 days after delivery, the flow is red and the amount may  be similar to a period.  It is common for the flow to start and stop.  In the first few days, you may pass some small clots. Let your nurses know if you begin to pass large clots or your flow increases.  Do not  flush blood clots down the toilet before having the nurse look at them.  During the next 3 10 days after delivery, your flow should become more watery and pink or brown-tinged in color.  Ten to fourteen days after delivery, your flow should be a small amount of yellowish-white discharge.  The amount of your flow will decrease over the first few weeks after delivery. Your flow may stop in 6 8 weeks. Most women have had their flow stop by 12 weeks after delivery.  You should change your sanitary pads frequently.  Wash your hands thoroughly with soap and water for at least 20 seconds after changing pads, using the toilet, or before holding or feeding your newborn.  You should feel like you need to empty your bladder within the first 6 8 hours after delivery.  In case you become weak, lightheaded, or faint, call your nurse before you get out of bed for the first time and before you take a shower for the first time.  Within the first few days after delivery, your breasts may begin to feel tender and full. This is called engorgement. Breast tenderness usually goes away within 48 72 hours after engorgement occurs. You may also notice milk leaking from your breasts. If you are not breastfeeding, do not stimulate your breasts. Breast stimulation can make your breasts produce more milk.  Spending as much time as possible with your newborn is very important. During this time, you and your newborn can feel close and get to know  each other. Having your newborn stay in your room (rooming in) will help to strengthen the bond with your newborn. It will give you time to get to know your newborn and become comfortable caring for your newborn.  Your hormones change after delivery. Sometimes the hormone changes can temporarily cause you to feel sad or tearful. These feelings should not last more than a few days. If these feelings last longer than that, you should talk to your caregiver.  If desired, talk to your caregiver about methods of family planning or contraception.  Talk to your caregiver about immunizations. Your caregiver may want you to have the following immunizations before leaving the hospital:  Tetanus, diphtheria, and pertussis (Tdap) or tetanus and diphtheria (Td) immunization. It is very important that you and your family (including grandparents) or others caring for your newborn are up-to-date with the Tdap or Td immunizations. The Tdap or Td immunization can help protect your newborn from getting ill.  Rubella immunization.  Varicella (chickenpox) immunization.  Influenza immunization. You should receive this annual immunization if you did not receive the immunization during your pregnancy. Document Released: 02/02/2007 Document Revised: 12/31/2011 Document Reviewed: 12/03/2011 [facility] Patient Information 2014 [place], [place].   Postpartum Depression and Baby Blues  The postpartum period begins right after the birth of a baby. During this time, there is often a great amount of joy and excitement. It is also a time of considerable changes in the life of the parent(s). Regardless of how many times a mother gives birth, each child brings new challenges and dynamics to the family. It is not unusual to have feelings of excitement accompanied by confusing shifts in moods, emotions, and thoughts. All mothers are  at risk of developing postpartum depression or the "baby blues." These mood changes can occur right  after giving birth, or they may occur many months after giving birth. The baby blues or postpartum depression can be mild or severe. Additionally, postpartum depression can resolve rather quickly, or it can be a long-term condition. CAUSES Elevated hormones and their rapid decline are thought to be a main cause of postpartum depression and the baby blues. There are a number of hormones that radically change during and after pregnancy. Estrogen and progesterone usually decrease immediately after delivering your baby. The level of thyroid hormone and various cortisol steroids also rapidly drop. Other factors that play a major role in these changes include major life events and genetics.  RISK FACTORS If you have any of the following risks for the baby blues or postpartum depression, know what symptoms to watch out for during the postpartum period. Risk factors that may increase the likelihood of getting the baby blues or postpartum depression include: 1. Havinga personal or family history of depression. 2. Having depression while being pregnant. 3. Having premenstrual or oral contraceptive-associated mood issues. 4. Having exceptional life stress. 5. Having marital conflict. 6. Lacking a social support network. 7. Having a baby with special needs. 8. Having health problems such as diabetes. SYMPTOMS Baby blues symptoms include:  Brief fluctuations in mood, such as going from extreme happiness to sadness.  Decreased concentration.  Difficulty sleeping.  Crying spells, tearfulness.  Irritability.  Anxiety. Postpartum depression symptoms typically begin within the first month after giving birth. These symptoms include:  Difficulty sleeping or excessive sleepiness.  Marked weight loss.  Agitation.  Feelings of worthlessness.  Lack of interest in activity or food. Postpartum psychosis is a very concerning condition and can be dangerous. Fortunately, it is rare. Displaying any of the  following symptoms is cause for immediate medical attention. Postpartum psychosis symptoms include:  Hallucinations and delusions.  Bizarre or disorganized behavior.  Confusion or disorientation. DIAGNOSIS  A diagnosis is made by an evaluation of your symptoms. There are no medical or lab tests that lead to a diagnosis, but there are various questionnaires that a caregiver may use to identify those with the baby blues, postpartum depression, or psychosis. Often times, a screening tool called the [place] Postnatal Depression Scale is used to diagnose depression in the postpartum period.  TREATMENT The baby blues usually goes away on its own in 1 to 2 weeks. Social support is often all that is needed. You should be encouraged to get adequate sleep and rest. Occasionally, you may be given medicines to help you sleep.  Postpartum depression requires treatment as it can last several months or longer if it is not treated. Treatment may include individual or group therapy, medicine, or both to address any social, physiological, and psychological factors that may play a role in the depression. Regular exercise, a healthy diet, rest, and social support may also be strongly recommended.  Postpartum psychosis is more serious and needs treatment right away. Hospitalization is often needed. HOME CARE INSTRUCTIONS  Get as much rest as you can. Nap when the baby sleeps.  Exercise regularly. Some women find yoga and walking to be beneficial.  Eat a balanced and nourishing diet.  Do little things that you enjoy. Have a cup of tea, take a bubble bath, read your favorite magazine, or listen to your favorite music.  Avoid alcohol.  Ask for help with household chores, cooking, grocery shopping, or running errands as needed. Do  not try to do everything.  Talk to people close to you about how you are feeling. Get support from your partner, family members, friends, or other new moms.  Try to stay positive in  how you think. Think about the things you are grateful for.  Do not spend a lot of time alone.  Only take medicine as directed by your caregiver.  Keep all your postpartum appointments.  Let your caregiver know if you have any concerns. SEEK MEDICAL CARE IF: You are having a reaction or problems with your medicine. SEEK IMMEDIATE MEDICAL CARE IF:  You have suicidal feelings.  You feel you may harm the baby or someone else. Document Released: 01/10/2004 Document Revised: 06/30/2011 Document Reviewed: 02/11/2011 [facility] Patient Information 2014 [place], [place].     Breastfeeding Deciding to breastfeed is one of the best choices you can make for you and your baby. A change in hormones during pregnancy causes your breast tissue to grow and increases the number and size of your milk ducts. These hormones also allow proteins, sugars, and fats from your blood supply to make breast milk in your milk-producing glands. Hormones prevent breast milk from being released before your baby is born as well as prompt milk flow after birth. Once breastfeeding has begun, thoughts of your baby, as well as his or her sucking or crying, can stimulate the release of milk from your milk-producing glands.  BENEFITS OF BREASTFEEDING For Your Baby  Your first milk (colostrum) helps your baby's digestive system function better.   There are antibodies in your milk that help your baby fight off infections.   Your baby has a lower incidence of asthma, allergies, and sudden infant death syndrome.   The nutrients in breast milk are better for your baby than infant formulas and are designed uniquely for your baby's needs.   Breast milk improves your baby's brain development.   Your baby is less likely to develop other conditions, such as childhood obesity, asthma, or type 2 diabetes mellitus.  For You   Breastfeeding helps to create a very special bond between you and your baby.   Breastfeeding is  convenient. Breast milk is always available at the correct temperature and costs nothing.   Breastfeeding helps to burn calories and helps you lose the weight gained during pregnancy.   Breastfeeding makes your uterus contract to its prepregnancy size faster and slows bleeding (lochia) after you give birth.   Breastfeeding helps to lower your risk of developing type 2 diabetes mellitus, osteoporosis, and breast or ovarian cancer later in life. SIGNS THAT YOUR BABY IS HUNGRY Early Signs of Hunger  Increased alertness or activity.  Stretching.  Movement of the head from side to side.  Movement of the head and opening of the mouth when the corner of the mouth or cheek is stroked (rooting).  Increased sucking sounds, smacking lips, cooing, sighing, or squeaking.  Hand-to-mouth movements.  Increased sucking of fingers or hands. Late Signs of Hunger  Fussing.  Intermittent crying. Extreme Signs of Hunger Signs of extreme hunger will require calming and consoling before your baby will be able to breastfeed successfully. Do not wait for the following signs of extreme hunger to occur before you initiate breastfeeding:   Restlessness.  A loud, strong cry.   Screaming.   BREASTFEEDING BASICS Breastfeeding Initiation  Find a comfortable place to sit or lie down, with your neck and back well supported.  Place a pillow or rolled up blanket under your baby to  bring him or her to the level of your breast (if you are seated). Nursing pillows are specially designed to help support your arms and your baby while you breastfeed.  Make sure that your baby's abdomen is facing your abdomen.   Gently massage your breast. With your fingertips, massage from your chest wall toward your nipple in a circular motion. This encourages milk flow. You may need to continue this action during the feeding if your milk flows slowly.  Support your breast with 4 fingers underneath and your thumb above  your nipple. Make sure your fingers are well away from your nipple and your baby's mouth.   Stroke your baby's lips gently with your finger or nipple.   When your baby's mouth is open wide enough, quickly bring your baby to your breast, placing your entire nipple and as much of the colored area around your nipple (areola) as possible into your baby's mouth.   More areola should be visible above your baby's upper lip than below the lower lip.   Your baby's tongue should be between his or her lower gum and your breast.   Ensure that your baby's mouth is correctly positioned around your nipple (latched). Your baby's lips should create a seal on your breast and be turned out (everted).  It is common for your baby to suck about 2-3 minutes in order to start the flow of breast milk. Latching Teaching your baby how to latch on to your breast properly is very important. An improper latch can cause nipple pain and decreased milk supply for you and poor weight gain in your baby. Also, if your baby is not latched onto your nipple properly, he or she may swallow some air during feeding. This can make your baby fussy. Burping your baby when you switch breasts during the feeding can help to get rid of the air. However, teaching your baby to latch on properly is still the best way to prevent fussiness from swallowing air while breastfeeding. Signs that your baby has successfully latched on to your nipple:    Silent tugging or silent sucking, without causing you pain.   Swallowing heard between every 3-4 sucks.    Muscle movement above and in front of his or her ears while sucking.  Signs that your baby has not successfully latched on to nipple:   Sucking sounds or smacking sounds from your baby while breastfeeding.  Nipple pain. If you think your baby has not latched on correctly, slip your finger into the corner of your baby's mouth to break the suction and place it between your baby's gums.  Attempt breastfeeding initiation again. Signs of Successful Breastfeeding Signs from your baby:   A gradual decrease in the number of sucks or complete cessation of sucking.   Falling asleep.   Relaxation of his or her body.   Retention of a small amount of milk in his or her mouth.   Letting go of your breast by himself or herself. Signs from you:  Breasts that have increased in firmness, weight, and size 1-3 hours after feeding.   Breasts that are softer immediately after breastfeeding.  Increased milk volume, as well as a change in milk consistency and color by the fifth day of breastfeeding.   Nipples that are not sore, cracked, or bleeding. Signs That Your Pecola Leisure is Getting Enough Milk  Wetting at least 3 diapers in a 24-hour period. The urine should be clear and pale yellow by age 36 days.  At least 3 stools in a 24-hour period by age 36 days. The stool should be soft and yellow.  At least 3 stools in a 24-hour period by age 83 days. The stool should be seedy and yellow.  No loss of weight greater than 10% of birth weight during the first 38 days of age.  Average weight gain of 4-7 ounces (113-198 g) per week after age 16 days.  Consistent daily weight gain by age 36 days, without weight loss after the age of 2 weeks. After a feeding, your baby may spit up a small amount. This is common. BREASTFEEDING FREQUENCY AND DURATION Frequent feeding will help you make more milk and can prevent sore nipples and breast engorgement. Breastfeed when you feel the need to reduce the fullness of your breasts or when your baby shows signs of hunger. This is called "breastfeeding on demand." Avoid introducing a pacifier to your baby while you are working to establish breastfeeding (the first 4-6 weeks after your baby is born). After this time you may choose to use a pacifier. Research has shown that pacifier use during the first year of a baby's life decreases the risk of sudden infant death  syndrome (SIDS). Allow your baby to feed on each breast as long as he or she wants. Breastfeed until your baby is finished feeding. When your baby unlatches or falls asleep while feeding from the first breast, offer the second breast. Because newborns are often sleepy in the first few weeks of life, you may need to awaken your baby to get him or her to feed. Breastfeeding times will vary from baby to baby. However, the following rules can serve as a guide to help you ensure that your baby is properly fed:  Newborns (babies 71 weeks of age or younger) may breastfeed every 1-3 hours.  Newborns should not go longer than 3 hours during the day or 5 hours during the night without breastfeeding.  You should breastfeed your baby a minimum of 8 times in a 24-hour period until you begin to introduce solid foods to your baby at around 70 months of age. BREAST MILK PUMPING Pumping and storing breast milk allows you to ensure that your baby is exclusively fed your breast milk, even at times when you are unable to breastfeed. This is especially important if you are going back to work while you are still breastfeeding or when you are not able to be present during feedings. Your lactation consultant can give you guidelines on how long it is safe to store breast milk.  A breast pump is a machine that allows you to pump milk from your breast into a sterile bottle. The pumped breast milk can then be stored in a refrigerator or freezer. Some breast pumps are operated by hand, while others use electricity. Ask your lactation consultant which type will work best for you. Breast pumps can be purchased, but some hospitals and breastfeeding support groups lease breast pumps on a monthly basis. A lactation consultant can teach you how to hand express breast milk, if you prefer not to use a pump.  CARING FOR YOUR BREASTS WHILE YOU BREASTFEED Nipples can become dry, cracked, and sore while breastfeeding. The following  recommendations can help keep your breasts moisturized and healthy:  Avoid using soap on your nipples.   Wear a supportive bra. Although not required, special nursing bras and tank tops are designed to allow access to your breasts for breastfeeding without taking off your entire bra  or top. Avoid wearing underwire-style bras or extremely tight bras.  Air dry your nipples for 3-54minutes after each feeding.   Use only cotton bra pads to absorb leaked breast milk. Leaking of breast milk between feedings is normal.   Use lanolin on your nipples after breastfeeding. Lanolin helps to maintain your skin's normal moisture barrier. If you use pure lanolin, you do not need to wash it off before feeding your baby again. Pure lanolin is not toxic to your baby. You may also hand express a few drops of breast milk and gently massage that milk into your nipples and allow the milk to air dry. In the first few weeks after giving birth, some women experience extremely full breasts (engorgement). Engorgement can make your breasts feel heavy, warm, and tender to the touch. Engorgement peaks within 3-5 days after you give birth. The following recommendations can help ease engorgement:  Completely empty your breasts while breastfeeding or pumping. You may want to start by applying warm, moist heat (in the shower or with warm water-soaked hand towels) just before feeding or pumping. This increases circulation and helps the milk flow. If your baby does not completely empty your breasts while breastfeeding, pump any extra milk after he or she is finished.  Wear a snug bra (nursing or regular) or tank top for 1-2 days to signal your body to slightly decrease milk production.  Apply ice packs to your breasts, unless this is too uncomfortable for you.  Make sure that your baby is latched on and positioned properly while breastfeeding. If engorgement persists after 48 hours of following these recommendations, contact  your health care provider or a Advertising copywriter. OVERALL HEALTH CARE RECOMMENDATIONS WHILE BREASTFEEDING  Eat healthy foods. Alternate between meals and snacks, eating 3 of each per day. Because what you eat affects your breast milk, some of the foods may make your baby more irritable than usual. Avoid eating these foods if you are sure that they are negatively affecting your baby.  Drink milk, fruit juice, and water to satisfy your thirst (about 10 glasses a day).   Rest often, relax, and continue to take your prenatal vitamins to prevent fatigue, stress, and anemia.  Continue breast self-awareness checks.  Avoid chewing and smoking tobacco.  Avoid alcohol and drug use. Some medicines that may be harmful to your baby can pass through breast milk. It is important to ask your health care provider before taking any medicine, including all over-the-counter and prescription medicine as well as vitamin and herbal supplements. It is possible to become pregnant while breastfeeding. If birth control is desired, ask your health care provider about options that will be safe for your baby. SEEK MEDICAL CARE IF:   You feel like you want to stop breastfeeding or have become frustrated with breastfeeding.  You have painful breasts or nipples.  Your nipples are cracked or bleeding.  Your breasts are red, tender, or warm.  You have a swollen area on either breast.  You have a fever or chills.  You have nausea or vomiting.  You have drainage other than breast milk from your nipples.  Your breasts do not become full before feedings by the fifth day after you give birth.  You feel sad and depressed.  Your baby is too sleepy to eat well.  Your baby is having trouble sleeping.   Your baby is wetting less than 3 diapers in a 24-hour period.  Your baby has less than 3 stools in a 24-hour  period.  Your baby's skin or the white part of his or her eyes becomes yellow.   Your baby is not  gaining weight by 75 days of age. SEEK IMMEDIATE MEDICAL CARE IF:   Your baby is overly tired (lethargic) and does not want to wake up and feed.  Your baby develops an unexplained fever. Document Released: 04/07/2005 Document Revised: 04/12/2013 Document Reviewed: 09/29/2012 [facility] Patient Information 2015 [place], [place]. This information is not intended to replace advice given to you by your health care provider. Make sure you discuss any questions you have with your health care provider.

## 2015-04-27 NOTE — Lactation Note (Signed)
This note was copied from the chart of Tara Sanchez. Lactation Consultation Note  Patient Name: Tara Girard Cootermber Digman [id number]'s Date: 04/27/2015 Reason for consult: Follow-up assessment Baby at 40 hr of life and mom is mostly formula feeding. She is latching some, but she does not like the feel of the baby on the breast. She denies pain, states that it makes her uncomfortable. At times baby will not latch but he will take a bottle. She plans to only pump [facility] She reports exclusively pumping for 1 yr for her daughter with no issues. Discussed breast changes, nipple care, pumping, feeding frequency, and artificial nipples. No concerns voiced at this time. She is aware of OP services and support group. She will call as needed for bf help.   Maternal Data    Feeding Feeding Type: Breast Fed Nipple Type: Slow - flow Length of feed: 6 min  LATCH Score/Interventions                      Lactation Tools Discussed/Used     Consult Status Consult Status: Follow-up Date: 04/28/15 Follow-up type: In-patient    Rulon Eisenmengerlizabeth E Jaevion Goto 04/27/2015, 6:02 PM

## 2015-04-28 NOTE — Lactation Note (Signed)
This note was copied from the chart of Tara Sanchez. Lactation Consultation Note  Patient Name: Tara Sanchez UJWJX'BToday's Date: 04/28/2015 Reason for consult: Follow-up assessment;Infant < 6lbs;Late preterm infant Infant is 5856 hours old and seen by lactation for follow-up assessment. Infant was asleep when LC entered. Mom reports that she has been trying to pump every 3 hrs and that when she pumped ~9am she got 25mL, which she gave to the infant. Mom reports that she prefers to pump and that is what she plans to do when she goes home. Mom stated that she did try to latch baby before pumping at the last feed but infant was too sleepy. Reviewed reasons why she continues to be encouraged to latch baby instead of just pumping. Mom has insurance so plans to get a pump from them. Mom did not have WIC during pregnancy but has an appointment 05/04/15 to add her baby & herself. Mom reports she is starting to feel more full so encouraged pt to do breast massage while pumping and hand expression after pumping to ensure she 'drains' her breasts to prevent engorgement. Mom reports no other questions at this time. Encouraged mom to ask for LC at a future BF to assist with latch.  Maternal Data    Feeding Feeding Type: Bottle Fed - Breast Milk Nipple Type: Slow - flow Length of feed: 10 min  LATCH Score/Interventions                      Lactation Tools Discussed/Used Breast pump type: Double-Electric Breast Pump WIC Program: No (Pt has appt 05/04/15 for herself & infant but was not on during pregnancy)   Consult Status Consult Status: Follow-up Date: 04/29/15 Follow-up type: In-patient    [name] 04/28/2015, 10:14 AM

## 2015-06-21 ENCOUNTER — Encounter (HOSPITAL_COMMUNITY): Payer: Self-pay | Admitting: Emergency Medicine

## 2015-06-21 DIAGNOSIS — Z791 Long term (current) use of non-steroidal anti-inflammatories (NSAID): Secondary | ICD-10-CM | POA: Insufficient documentation

## 2015-06-21 DIAGNOSIS — N390 Urinary tract infection, site not specified: Secondary | ICD-10-CM | POA: Diagnosis not present

## 2015-06-21 DIAGNOSIS — Z87891 Personal history of nicotine dependence: Secondary | ICD-10-CM | POA: Insufficient documentation

## 2015-06-21 DIAGNOSIS — Z8619 Personal history of other infectious and parasitic diseases: Secondary | ICD-10-CM | POA: Diagnosis not present

## 2015-06-21 DIAGNOSIS — Z79899 Other long term (current) drug therapy: Secondary | ICD-10-CM | POA: Diagnosis not present

## 2015-06-21 DIAGNOSIS — R300 Dysuria: Secondary | ICD-10-CM | POA: Diagnosis present

## 2015-06-21 DIAGNOSIS — Z3202 Encounter for pregnancy test, result negative: Secondary | ICD-10-CM | POA: Insufficient documentation

## 2015-06-21 LAB — CBC WITH DIFFERENTIAL/PLATELET
Basophils Absolute: 0 10*3/uL (ref 0.0–0.1)
Basophils Relative: 0 %
EOS ABS: 0.1 10*3/uL (ref 0.0–0.7)
EOS PCT: 1 %
HCT: 39.8 % (ref 36.0–46.0)
Hemoglobin: 12.8 g/dL (ref 12.0–15.0)
LYMPHS ABS: 3.7 10*3/uL (ref 0.7–4.0)
Lymphocytes Relative: 32 %
MCH: 29.2 pg (ref 26.0–34.0)
MCHC: 32.2 g/dL (ref 30.0–36.0)
MCV: 90.9 fL (ref 78.0–100.0)
Monocytes Absolute: 0.9 10*3/uL (ref 0.1–1.0)
Monocytes Relative: 7 %
Neutro Abs: 6.9 10*3/uL (ref 1.7–7.7)
Neutrophils Relative %: 60 %
PLATELETS: 294 10*3/uL (ref 150–400)
RBC: 4.38 MIL/uL (ref 3.87–5.11)
RDW: 13.8 % (ref 11.5–15.5)
WBC: 11.7 10*3/uL — AB (ref 4.0–10.5)

## 2015-06-21 LAB — URINALYSIS, ROUTINE W REFLEX MICROSCOPIC
BILIRUBIN URINE: NEGATIVE
Glucose, UA: NEGATIVE mg/dL
KETONES UR: NEGATIVE mg/dL
NITRITE: POSITIVE — AB
PH: 6 (ref 5.0–8.0)
Protein, ur: 100 mg/dL — AB
Specific Gravity, Urine: 1.022 (ref 1.005–1.030)

## 2015-06-21 LAB — BASIC METABOLIC PANEL
Anion gap: 12 (ref 5–15)
BUN: 10 mg/dL (ref 6–20)
CALCIUM: 9.4 mg/dL (ref 8.9–10.3)
CO2: 27 mmol/L (ref 22–32)
Chloride: 103 mmol/L (ref 101–111)
Creatinine, Ser: 0.88 mg/dL (ref 0.44–1.00)
GFR calc Af Amer: >60 mL/min (ref >60)
Glucose, Bld: 99 mg/dL (ref 65–99)
POTASSIUM: 4.2 mmol/L (ref 3.5–5.1)
SODIUM: 142 mmol/L (ref 135–145)

## 2015-06-21 LAB — URINE MICROSCOPIC-ADD ON

## 2015-06-21 LAB — POC URINE PREG, ED: Preg Test, Ur: NEGATIVE

## 2015-06-21 NOTE — ED Notes (Signed)
Pt. reports dysuria and concentrated malodorous urine onset this week , denies hematuria , no fever or chills.

## 2015-06-22 ENCOUNTER — Emergency Department (HOSPITAL_COMMUNITY)
Admission: EM | Admit: 2015-06-22 | Discharge: 2015-06-22 | Disposition: A | Discharge status: Home / Self Care | Payer: BLUE CROSS/BLUE SHIELD | Attending: Emergency Medicine | Admitting: Emergency Medicine

## 2015-06-22 DIAGNOSIS — N390 Urinary tract infection, site not specified: Secondary | ICD-10-CM

## 2015-06-22 MED ORDER — CEPHALEXIN 500 MG PO CAPS: 500.0000 mg | ORAL_CAPSULE | Freq: Four times a day (QID) | ORAL | Status: DC

## 2015-06-22 MED ORDER — CEPHALEXIN 250 MG PO CAPS
500.0000 mg | ORAL_CAPSULE | Freq: Once | ORAL | Status: AC
  Administered 2015-06-22: 500 mg via ORAL
  Filled 2015-06-22: qty 2

## 2015-06-22 MED ORDER — PHENAZOPYRIDINE HCL 200 MG PO TABS: 200.0000 mg | ORAL_TABLET | Freq: Three times a day (TID) | ORAL | Status: DC

## 2015-06-22 MED ORDER — PHENAZOPYRIDINE HCL 100 MG PO TABS
200.0000 mg | ORAL_TABLET | Freq: Once | ORAL | Status: AC
  Administered 2015-06-22: 200 mg via ORAL
  Filled 2015-06-22: qty 2

## 2015-06-22 NOTE — Discharge Instructions (Signed)
Stay very well hydrated with plenty of water throughout the day. Please take antibiotic until completion. Use pyridium as directed to decrease painful urination but know that a common side effect is to turn your urine a bright orange/red color. This is not a harmful side effect. Follow up with primary care physician in 1 week for recheck of ongoing symptoms. ° °Please seek immediate care if you develop the following: °Your symptoms are no better or worse in 3 days. °There is severe back pain or lower abdominal pain.  °You develop chills.  °You have a fever.  °There is nausea or vomiting.  °There is continued burning or discomfort with urination.  °

## 2015-06-22 NOTE — ED Provider Notes (Signed)
CSN: [id number]     Arrival date & time 06/21/15  2251 History   First MD Initiated Contact with Patient 06/22/15 0014     Chief Complaint  Patient presents with  . Dysuria     (Consider location/radiation/quality/duration/timing/severity/associated sxs/prior Treatment) Patient is a 25 y.o. female presenting with dysuria. The history is provided by the patient and medical records. No language interpreter was used.  Dysuria Associated symptoms: abdominal pain (Suprapubic)   Associated symptoms: no fever, no nausea, no vaginal discharge and no vomiting    Tara Sanchez is a 25 y.o. female  with no pertinent PMH who presents to the Emergency Department complaining of worsening dysuria and suprapubic discomfort over the last 4-5 days. Associated symptoms include malodorous urine. Patient denies fever, chills, and back pain. Patient tried over the counter Azo which temporarily relieved symptoms, however dysuria and pain has now returned. States a history of UTIs 2-3 times in the past. - No antibiotics in the last 6 months.  Past Medical History  Diagnosis Date  . Infection     UTI  . Headache   . Genital herpes    Past Surgical History  Procedure Laterality Date  . Therapeutic abortion     Family History  Problem Relation Age of Onset  . Asthma Neg Hx   . Cancer Neg Hx   . Heart disease Neg Hx   . Hypertension Neg Hx   . Stroke Neg Hx   . Alcohol abuse Neg Hx   . Arthritis Neg Hx   . Birth defects Neg Hx   . COPD Neg Hx   . Depression Neg Hx   . Drug abuse Neg Hx   . Early death Neg Hx   . Hearing loss Neg Hx   . Hyperlipidemia Neg Hx   . Kidney disease Neg Hx   . Learning disabilities Neg Hx   . Mental illness Neg Hx   . Mental retardation Neg Hx   . Miscarriages / Stillbirths Neg Hx   . Vision loss Neg Hx   . Varicose Veins Neg Hx   . Diabetes Paternal Grandmother    Social History  Substance Use Topics  . Smoking status: Former Smoker -- 2 years    Types: Cigars   . Smokeless tobacco: Never Used  . Alcohol Use: No   OB History    Gravida Para Term Preterm AB TAB SAB Ectopic Multiple Living   4 2 0 0 2     Review of Systems  Constitutional: Negative for fever and chills.  HENT: Negative for congestion and sore throat.   Eyes: Negative for visual disturbance.  Respiratory: Negative for cough and shortness of breath.   Cardiovascular: Negative for chest pain.  Gastrointestinal: Positive for abdominal pain (Suprapubic). Negative for nausea and vomiting.  Genitourinary: Positive for dysuria and frequency. Negative for vaginal bleeding and vaginal discharge.  Musculoskeletal: Negative for back pain.  Skin: Negative for rash.  Neurological: Negative for weakness and headaches.      Allergies  Macrobid; Phenergan; and Tessalon  Home Medications   Prior to Admission medications   Medication Sig Start Date End Date Taking? Authorizing Provider  cephALEXin (KEFLEX) 500 MG capsule Take 1 capsule (500 mg total) by mouth 4 (four) times daily. 06/22/15   Jemya Depierro Pilcher Sarahjane Matherly, PA-C  ibuprofen (ADVIL,MOTRIN) 600 MG tablet Take 1 tablet (600 mg total) by mouth every 6 (six) hours. 04/27/15   Venus Standard, CNM  phenazopyridine (  PYRIDIUM) 200 MG tablet Take 1 tablet (200 mg total) by mouth 3 (three) times daily. 06/22/15   Chase PicketJaime Pilcher Jiyan Walkowski, PA-C  Prenatal Vit-Fe Fumarate-FA (PRENATAL MULTIVITAMIN) TABS tablet Take 1 tablet by mouth daily.     Historical Provider, MD   BP 116/76 mmHg  Pulse 86  Temp(Src) 98.4 F (36.9 C) (Oral)  Resp 16  Ht 5\' 7"  (1.702 m)  Wt 74.844 kg  BMI 25.84 kg/m2  SpO2 97%  LMP 06/06/2015 (Approximate) Physical Exam  Constitutional: She is oriented to person, place, and time. She appears well-developed and well-nourished.  Alert and in no acute distress  HENT:  Head: Normocephalic and atraumatic.  Cardiovascular: Normal rate, regular rhythm, normal heart sounds and intact distal pulses.  Exam reveals no gallop and no  friction rub.   No murmur heard. Pulmonary/Chest: Effort normal and breath sounds normal. No respiratory distress. She has no wheezes. She has no rales. She exhibits no tenderness.  Abdominal: Soft. Bowel sounds are normal. She exhibits no distension and no mass. There is tenderness (Suprapubic). There is no rebound and no guarding.  No CVA tenderness.   Musculoskeletal: She exhibits no edema.  Neurological: She is alert and oriented to person, place, and time.  Skin: Skin is warm and dry. No rash noted.  Psychiatric: She has a normal mood and affect. Her behavior is normal. Judgment and thought content normal.  Nursing note and vitals reviewed.  ED Course  Procedures (including critical care time) Labs Review Labs Reviewed  URINALYSIS, ROUTINE W REFLEX MICROSCOPIC (NOT AT [facility]) - Abnormal; Notable for the following:    Color, Urine Lacora (*)    APPearance TURBID (*)    Hgb urine dipstick LARGE (*)    Protein, ur 100 (*)    Nitrite POSITIVE (*)    Leukocytes, UA LARGE (*)    All other components within normal limits  CBC WITH DIFFERENTIAL/PLATELET - Abnormal; Notable for the following:    WBC 11.7 (*)    All other components within normal limits  URINE MICROSCOPIC-ADD ON - Abnormal; Notable for the following:    Squamous Epithelial / LPF 0-5 (*)    Bacteria, UA MANY (*)    All other components within normal limits  BASIC METABOLIC PANEL  POC URINE PREG, ED    Imaging Review No results found. I have personally reviewed and evaluated these images and lab results as part of my medical decision-making.   EKG Interpretation None      MDM   Final diagnoses:  UTI (lower urinary tract infection)   Fantasha Pasty ArchJ Lemke presents with signs and symptoms consistent with urinary tract infection. UA was obtained which showed positive nitrite and positive leukocytes with too numerous to count white blood cells. All other labs were reviewed and are reassuring. Will treat with Keflex and give  Pyridium for symptomatic relief. Patient is the mother of an infant who is with her today in ED, given that it is 00:45 AM and patient is with a small child, will go ahead and give first dose of antibiotics and Pyridium here in ED and patient can get prescription filled in the morning. Return precautions and home care instructions discussed. PCP follow-up encouraged. All questions answered.  [facility] Pilcher Fortunato Nordin, PA-C 06/22/15 [id number]  Pricilla LovelessScott Goldston, MD 06/22/15 [id number]

## 2015-07-06 ENCOUNTER — Encounter (HOSPITAL_COMMUNITY): Payer: Self-pay | Admitting: Emergency Medicine

## 2015-07-06 ENCOUNTER — Emergency Department (HOSPITAL_COMMUNITY)
Admission: EM | Admit: 2015-07-06 | Discharge: 2015-07-06 | Disposition: A | Discharge status: Home / Self Care | Payer: Medicaid Other | Attending: Emergency Medicine | Admitting: Emergency Medicine

## 2015-07-06 DIAGNOSIS — R300 Dysuria: Secondary | ICD-10-CM | POA: Diagnosis present

## 2015-07-06 DIAGNOSIS — N898 Other specified noninflammatory disorders of vagina: Secondary | ICD-10-CM | POA: Diagnosis not present

## 2015-07-06 DIAGNOSIS — N390 Urinary tract infection, site not specified: Secondary | ICD-10-CM | POA: Diagnosis not present

## 2015-07-06 NOTE — ED Notes (Signed)
Patient was treated for UTI a week ago, but still having symptoms.   Patient states having vaginal odor that started about the same time of the UTI.  Patient is still having dysuria.

## 2015-07-06 NOTE — ED Notes (Signed)
Patient stated she leaving, can not wait any longer

## 2015-07-23 ENCOUNTER — Inpatient Hospital Stay (HOSPITAL_COMMUNITY)
Admission: AD | Admit: 2015-07-23 | Discharge: 2015-07-23 | Disposition: A | Discharge status: Home / Self Care | Payer: Medicaid Other | Source: Ambulatory Visit | Attending: Family Medicine | Admitting: Family Medicine

## 2015-07-23 DIAGNOSIS — N760 Acute vaginitis: Secondary | ICD-10-CM | POA: Diagnosis not present

## 2015-07-23 DIAGNOSIS — Z87440 Personal history of urinary (tract) infections: Secondary | ICD-10-CM | POA: Insufficient documentation

## 2015-07-23 DIAGNOSIS — Z881 Allergy status to other antibiotic agents status: Secondary | ICD-10-CM | POA: Insufficient documentation

## 2015-07-23 DIAGNOSIS — Z3202 Encounter for pregnancy test, result negative: Secondary | ICD-10-CM | POA: Insufficient documentation

## 2015-07-23 DIAGNOSIS — A499 Bacterial infection, unspecified: Secondary | ICD-10-CM

## 2015-07-23 DIAGNOSIS — Z833 Family history of diabetes mellitus: Secondary | ICD-10-CM | POA: Insufficient documentation

## 2015-07-23 DIAGNOSIS — R51 Headache: Secondary | ICD-10-CM | POA: Diagnosis not present

## 2015-07-23 DIAGNOSIS — N898 Other specified noninflammatory disorders of vagina: Secondary | ICD-10-CM | POA: Insufficient documentation

## 2015-07-23 DIAGNOSIS — Z888 Allergy status to other drugs, medicaments and biological substances status: Secondary | ICD-10-CM | POA: Insufficient documentation

## 2015-07-23 DIAGNOSIS — Z87891 Personal history of nicotine dependence: Secondary | ICD-10-CM | POA: Diagnosis not present

## 2015-07-23 DIAGNOSIS — B009 Herpesviral infection, unspecified: Secondary | ICD-10-CM | POA: Diagnosis not present

## 2015-07-23 DIAGNOSIS — B9689 Other specified bacterial agents as the cause of diseases classified elsewhere: Secondary | ICD-10-CM

## 2015-07-23 LAB — WET PREP, GENITAL
Sperm: NONE SEEN
Trich, Wet Prep: NONE SEEN
YEAST WET PREP: NONE SEEN

## 2015-07-23 LAB — URINALYSIS, ROUTINE W REFLEX MICROSCOPIC
BILIRUBIN URINE: NEGATIVE
Glucose, UA: NEGATIVE mg/dL
Hgb urine dipstick: NEGATIVE
KETONES UR: NEGATIVE mg/dL
NITRITE: NEGATIVE
Protein, ur: NEGATIVE mg/dL
Specific Gravity, Urine: 1.025 (ref 1.005–1.030)
pH: 6 (ref 5.0–8.0)

## 2015-07-23 LAB — URINE MICROSCOPIC-ADD ON

## 2015-07-23 LAB — POCT PREGNANCY, URINE: PREG TEST UR: NEGATIVE

## 2015-07-23 MED ORDER — METRONIDAZOLE 500 MG PO TABS: 500.0000 mg | ORAL_TABLET | Freq: Two times a day (BID) | ORAL | Status: DC

## 2015-07-23 NOTE — Discharge Instructions (Signed)

## 2015-07-23 NOTE — MAU Provider Note (Signed)
History     CSN: [id number]  Arrival date and time: 07/23/15 1059   None       Chief Complaint  Patient presents with  . Possible Pregnancy  . vag odor    HPI Tara Sanchez is a 25 y.o. female who presents for vaginal discharge & odor. Reports symptoms for the last week. Unsure what discharge looks like. Denies vaginal irritation, abdominal pain, or vaginal bleeding. Reports having a positive pregnancy test yesterday.   OB History    Gravida Para Term Preterm AB TAB SAB Ectopic Multiple Living   4 2 0 2 2 2    0 2      Past Medical History  Diagnosis Date  . Infection     UTI  . Headache   . Genital herpes     Past Surgical History  Procedure Laterality Date  . Therapeutic abortion      Family History  Problem Relation Age of Onset  . Asthma Neg Hx   . Cancer Neg Hx   . Heart disease Neg Hx   . Hypertension Neg Hx   . Stroke Neg Hx   . Alcohol abuse Neg Hx   . Arthritis Neg Hx   . Birth defects Neg Hx   . COPD Neg Hx   . Depression Neg Hx   . Drug abuse Neg Hx   . Early death Neg Hx   . Hearing loss Neg Hx   . Hyperlipidemia Neg Hx   . Kidney disease Neg Hx   . Learning disabilities Neg Hx   . Mental illness Neg Hx   . Mental retardation Neg Hx   . Miscarriages / Stillbirths Neg Hx   . Vision loss Neg Hx   . Varicose Veins Neg Hx   . Diabetes Paternal Grandmother     Social History  Substance Use Topics  . Smoking status: Former Smoker -- 2 years    Types: Cigars  . Smokeless tobacco: Never Used  . Alcohol Use: No    Allergies:  Allergies  Allergen Reactions  . Macrobid [Nitrofurantoin] Anaphylaxis  . Phenergan [Promethazine Hcl] Other (See Comments)    Reaction:  Severe sedation  . Tessalon [Benzonatate] Other (See Comments)    Reaction:  Severe sedation    Prescriptions prior to admission  Medication Sig Dispense Refill Last Dose  . cephALEXin (KEFLEX) 500 MG capsule Take 1 capsule (500 mg total) by mouth 4 (four) times daily. 40  capsule 0   . ibuprofen (ADVIL,MOTRIN) 600 MG tablet Take 1 tablet (600 mg total) by mouth every 6 (six) hours. 30 tablet 0   . phenazopyridine (PYRIDIUM) 200 MG tablet Take 1 tablet (200 mg total) by mouth 3 (three) times daily. 6 tablet 0   . Prenatal Vit-Fe Fumarate-FA (PRENATAL MULTIVITAMIN) TABS tablet Take 1 tablet by mouth daily.    04/25/2015 at Unknown time    Review of Systems  Constitutional: Negative.   Gastrointestinal: Negative.   Genitourinary: Negative for dysuria.       + vaginal discharge & odor No vaginal bleeding   Physical Exam   Blood pressure 106/62, pulse 88, temperature 98.5 F (36.9 C), temperature source Oral, resp. rate 18, height 5' 7.5" (1.715 m), weight 163 lb 3.2 oz (74.027 kg), last menstrual period 07/05/2015, not currently breastfeeding.  Physical Exam  Nursing note and vitals reviewed. Constitutional: She is oriented to person, place, and time. She appears well-developed and well-nourished. No distress.  HENT:  Head: Normocephalic and  atraumatic.  Eyes: Conjunctivae are normal. Right eye exhibits no discharge. Left eye exhibits no discharge. No scleral icterus.  Neck: Normal range of motion.  Cardiovascular: Normal rate.   Respiratory: Effort normal. No respiratory distress.  Neurological: She is alert and oriented to person, place, and time.  Skin: Skin is warm and dry. She is not diaphoretic.  Psychiatric: She has a normal mood and affect. Her behavior is normal. Judgment and thought content normal.    MAU Course  Procedures Results for orders placed or performed during the hospital encounter of 07/23/15 (from the past 24 hour(s))  Urinalysis, Routine w reflex microscopic (not at [facility])     Status: Abnormal   Collection Time: 07/23/15 11:17 AM  Result Value Ref Range   Color, Urine YELLOW YELLOW   APPearance CLEAR CLEAR   Specific Gravity, Urine 1.025 1.005 - 1.030   pH 6.0 5.0 - 8.0   Glucose, UA NEGATIVE NEGATIVE mg/dL   Hgb urine  dipstick NEGATIVE NEGATIVE   Bilirubin Urine NEGATIVE NEGATIVE   Ketones, ur NEGATIVE NEGATIVE mg/dL   Protein, ur NEGATIVE NEGATIVE mg/dL   Nitrite NEGATIVE NEGATIVE   Leukocytes, UA TRACE (A) NEGATIVE  Urine microscopic-add on     Status: Abnormal   Collection Time: 07/23/15 11:17 AM  Result Value Ref Range   Squamous Epithelial / LPF 0-5 (A) NONE SEEN   WBC, UA 6-30 0 - 5 WBC/hpf   RBC / HPF 0-5 0 - 5 RBC/hpf   Bacteria, UA FEW (A) NONE SEEN  Pregnancy, urine POC     Status: None   Collection Time: 07/23/15 11:31 AM  Result Value Ref Range   Preg Test, Ur NEGATIVE NEGATIVE  Wet prep, genital     Status: Abnormal   Collection Time: 07/23/15 11:50 AM  Result Value Ref Range   Yeast Wet Prep HPF POC NONE SEEN NONE SEEN   Trich, Wet Prep NONE SEEN NONE SEEN   Clue Cells Wet Prep HPF POC PRESENT (A) NONE SEEN   WBC, Wet Prep HPF POC FEW (A) NONE SEEN   Sperm NONE SEEN     MDM UPT negative - will not further evaluate d/t pt being asymptomatic. Had faint positive HPT yesterday. Patient will repeat test at home yesterday & f/u with ob/gyn for pregnancy verification   Assessment and Plan  A: 1. BV (bacterial vaginosis)     P: Discharge home Rx flagyl - no alcohol Repeat pregnancy test in 1 week if no period Discussed reasons to return to MAU  Judeth Horn 07/23/2015, 12:29 PM

## 2015-07-23 NOTE — MAU Note (Signed)
NP had spoke with pt regarding plan.  Pt had to leave to pick up child.  NP called with results.

## 2015-07-23 NOTE — MAU Note (Signed)
+  HPT yesterday, having a "smell". First noted about a month ago, when had a UTI.  Went away- but now is back.

## 2015-07-24 ENCOUNTER — Ambulatory Visit (INDEPENDENT_AMBULATORY_CARE_PROVIDER_SITE_OTHER): Payer: Medicaid Other

## 2015-07-24 VITALS — BP 121/72 | HR 72 | Temp 98.5°F

## 2015-07-24 DIAGNOSIS — A549 Gonococcal infection, unspecified: Secondary | ICD-10-CM | POA: Diagnosis not present

## 2015-07-24 DIAGNOSIS — A749 Chlamydial infection, unspecified: Secondary | ICD-10-CM

## 2015-07-24 DIAGNOSIS — Z202 Contact with and (suspected) exposure to infections with a predominantly sexual mode of transmission: Secondary | ICD-10-CM

## 2015-07-24 LAB — GC/CHLAMYDIA PROBE AMP (~~LOC~~) NOT AT ARMC
Chlamydia: POSITIVE — AB
Neisseria Gonorrhea: POSITIVE — AB

## 2015-07-24 MED ORDER — CEFTRIAXONE SODIUM 250 MG IJ SOLR: 250.0000 mg | Freq: Once | INTRAMUSCULAR | Status: DC

## 2015-07-24 MED ORDER — AZITHROMYCIN 250 MG PO TABS
1000.0000 mg | ORAL_TABLET | Freq: Once | ORAL | Status: AC
  Administered 2015-07-24: 1000 mg via ORAL

## 2015-07-24 NOTE — Progress Notes (Signed)
Pt presents to clinic today for STD treatment for G/C. Pt was given Rocephin 250 mg and Azithromycin 1,000mg . Pt tolerated this well.

## 2015-08-16 ENCOUNTER — Ambulatory Visit (INDEPENDENT_AMBULATORY_CARE_PROVIDER_SITE_OTHER): Payer: Medicaid Other

## 2015-08-16 DIAGNOSIS — Z3202 Encounter for pregnancy test, result negative: Secondary | ICD-10-CM

## 2015-08-16 DIAGNOSIS — Z3200 Encounter for pregnancy test, result unknown: Secondary | ICD-10-CM

## 2015-08-16 NOTE — Progress Notes (Signed)
Pt presents today for a pregnancy test. Test confirmed she is not pregnant at this time.

## 2015-08-17 LAB — POCT PREGNANCY, URINE: PREG TEST UR: NEGATIVE

## 2015-08-27 ENCOUNTER — Encounter (HOSPITAL_COMMUNITY): Payer: Self-pay | Admitting: *Deleted

## 2015-08-27 ENCOUNTER — Emergency Department (HOSPITAL_COMMUNITY)
Admission: EM | Admit: 2015-08-27 | Discharge: 2015-08-27 | Disposition: A | Discharge status: Home / Self Care | Payer: Medicaid Other | Attending: Emergency Medicine | Admitting: Emergency Medicine

## 2015-08-27 DIAGNOSIS — Z3202 Encounter for pregnancy test, result negative: Secondary | ICD-10-CM | POA: Diagnosis not present

## 2015-08-27 DIAGNOSIS — Z8619 Personal history of other infectious and parasitic diseases: Secondary | ICD-10-CM | POA: Insufficient documentation

## 2015-08-27 DIAGNOSIS — Z792 Long term (current) use of antibiotics: Secondary | ICD-10-CM | POA: Diagnosis not present

## 2015-08-27 DIAGNOSIS — Z79899 Other long term (current) drug therapy: Secondary | ICD-10-CM | POA: Diagnosis not present

## 2015-08-27 DIAGNOSIS — Z202 Contact with and (suspected) exposure to infections with a predominantly sexual mode of transmission: Secondary | ICD-10-CM | POA: Diagnosis not present

## 2015-08-27 DIAGNOSIS — N938 Other specified abnormal uterine and vaginal bleeding: Secondary | ICD-10-CM | POA: Diagnosis not present

## 2015-08-27 DIAGNOSIS — Z87891 Personal history of nicotine dependence: Secondary | ICD-10-CM | POA: Insufficient documentation

## 2015-08-27 DIAGNOSIS — R300 Dysuria: Secondary | ICD-10-CM | POA: Diagnosis present

## 2015-08-27 DIAGNOSIS — N390 Urinary tract infection, site not specified: Secondary | ICD-10-CM | POA: Diagnosis not present

## 2015-08-27 LAB — WET PREP, GENITAL
SPERM: NONE SEEN
Trich, Wet Prep: NONE SEEN
YEAST WET PREP: NONE SEEN

## 2015-08-27 LAB — URINALYSIS, ROUTINE W REFLEX MICROSCOPIC
Glucose, UA: NEGATIVE mg/dL
Ketones, ur: >80 mg/dL — AB
NITRITE: NEGATIVE
PH: 5.5 (ref 5.0–8.0)
Protein, ur: 100 mg/dL — AB
SPECIFIC GRAVITY, URINE: 1.025 (ref 1.005–1.030)

## 2015-08-27 LAB — URINE MICROSCOPIC-ADD ON

## 2015-08-27 LAB — POC URINE PREG, ED: PREG TEST UR: NEGATIVE

## 2015-08-27 MED ORDER — AZITHROMYCIN 250 MG PO TABS
1000.0000 mg | ORAL_TABLET | Freq: Once | ORAL | Status: AC
  Administered 2015-08-27: 1000 mg via ORAL
  Filled 2015-08-27: qty 4

## 2015-08-27 MED ORDER — CEFTRIAXONE SODIUM 1 G IJ SOLR
1.0000 g | Freq: Once | INTRAMUSCULAR | Status: AC
  Administered 2015-08-27: 1 g via INTRAMUSCULAR
  Filled 2015-08-27: qty 10

## 2015-08-27 MED ORDER — LIDOCAINE HCL (PF) 1 % IJ SOLN
5.0000 mL | Freq: Once | INTRAMUSCULAR | Status: AC
  Administered 2015-08-27: 5 mL
  Filled 2015-08-27: qty 5

## 2015-08-27 NOTE — ED Notes (Signed)
Pelvic cart at bedside. NP to see and assess patient before RN assessment. See NP note.

## 2015-08-27 NOTE — ED Notes (Signed)
Pt reports possible exposure to std, having vaginal discharge. Wants to be checked for std and wants pregnancy test.

## 2015-08-27 NOTE — ED Provider Notes (Signed)
CSN: [id number]     Arrival date & time 08/27/15  1559 History  By signing my name below, I, Tanda RockersMargaux Venter, attest that this documentation has been prepared under the direction and in the presence of Kerrie BuffaloHope Sharlette Jansma, NP. Electronically Signed: Tanda RockersMargaux Venter, ED Scribe. 08/27/2015. 8:02 PM.   Chief Complaint  Patient presents with  . SEXUALLY TRANSMITTED DISEASE  . Dysuria   The history is provided by the patient. No language interpreter was used.     HPI Comments: Tara Sanchez is a 25 y.o. female who presents to the Emergency Department for STD exposure. She reports that she had chlamydia recently and was treated but states that her partner was not treated and they have unprotected sex after her treatment was complete. Pt states that she and her partner were tested for all STDs and were only positive for chlamydia. She also complains of dysuria, frequency, vaginal discharge, and a small amount of vaginal bleeding. Denies fever, chills, or any other associated symptoms.    Review of the patent's chart from the previous shows cultures positive for GC and Chlamydia.   Past Medical History  Diagnosis Date  . Infection     UTI  . Headache   . Genital herpes    Past Surgical History  Procedure Laterality Date  . Therapeutic abortion     Family History  Problem Relation Age of Onset  . Asthma Neg Hx   . Cancer Neg Hx   . Heart disease Neg Hx   . Hypertension Neg Hx   . Stroke Neg Hx   . Alcohol abuse Neg Hx   . Arthritis Neg Hx   . Birth defects Neg Hx   . COPD Neg Hx   . Depression Neg Hx   . Drug abuse Neg Hx   . Early death Neg Hx   . Hearing loss Neg Hx   . Hyperlipidemia Neg Hx   . Kidney disease Neg Hx   . Learning disabilities Neg Hx   . Mental illness Neg Hx   . Mental retardation Neg Hx   . Miscarriages / Stillbirths Neg Hx   . Vision loss Neg Hx   . Varicose Veins Neg Hx   . Diabetes Paternal Grandmother    Social History  Substance Use Topics  . Smoking status:  Former Smoker -- 2 years    Types: Cigars  . Smokeless tobacco: Never Used  . Alcohol Use: No   OB History    Gravida Para Term Preterm AB TAB SAB Ectopic Multiple Living   4 2 0 2 2 2    0 2     Review of Systems  Constitutional: Negative for fever and chills.  Genitourinary: Positive for dysuria, frequency, vaginal bleeding and vaginal discharge.  All other systems reviewed and are negative.  Allergies  Macrobid; Phenergan; and Tessalon  Home Medications   Prior to Admission medications   Medication Sig Start Date End Date Taking? Authorizing Provider  cefTRIAXone (ROCEPHIN) 250 MG injection Inject 250 mg into the muscle once.  FOR IM use in LARGE MUSCLE MASS 07/24/15   Marlis EdelsonWalidah N Karim, CNM  metroNIDAZOLE (FLAGYL) 500 MG tablet Take 1 tablet (500 mg total) by mouth 2 (two) times daily. 07/23/15   Judeth HornErin Lawrence, NP  Prenatal Vit-Fe Fumarate-FA (PRENATAL MULTIVITAMIN) TABS tablet Take 1 tablet by mouth daily.     Historical Provider, MD   BP 123/79 mmHg  Pulse 82  Temp(Src) 98.6 F (37 C) (Oral)  Resp 14  SpO2 99%  LMP  (LMP Unknown)   Physical Exam  Constitutional: She is oriented to person, place, and time. She appears well-developed and well-nourished. No distress.  HENT:  Head: Normocephalic and atraumatic.  Eyes: Conjunctivae and EOM are normal.  Neck: Neck supple. No tracheal deviation present.  Cardiovascular: Normal rate.   Pulmonary/Chest: Effort normal. No respiratory distress.  Abdominal: Soft. There is no tenderness.  Genitourinary:  External genitalia without lesions. Frothy yellow d/c vaginal vault. No CMT, no adnexal tenderness or mass palpated. Uterus without palpable enlargement.   Musculoskeletal: Normal range of motion.  Neurological: She is alert and oriented to person, place, and time.  Skin: Skin is warm and dry.  Psychiatric: She has a normal mood and affect. Her behavior is normal.  Nursing note and vitals reviewed.   ED Course  Procedures  (including critical care time)  DIAGNOSTIC STUDIES: Oxygen Saturation is 99% on RA, normal by my interpretation.    COORDINATION OF CARE: 8:00 PM-Discussed treatment plan which includes pelvic exam with pt at bedside and pt agreed to plan.   9:02 PM - Will treat with a gram of Rocephin IM and 1000 mg Zithromax. Gave pt instructions to follow up at health department for further testing. Pt understands and agrees with plan.   Labs Review Labs Reviewed  WET PREP, GENITAL - Abnormal; Notable for the following:    Clue Cells Wet Prep HPF POC PRESENT (*)    WBC, Wet Prep HPF POC MANY (*)    All other components within normal limits  URINALYSIS, ROUTINE W REFLEX MICROSCOPIC (NOT AT [facility]) - Abnormal; Notable for the following:    APPearance TURBID (*)    Hgb urine dipstick MODERATE (*)    Bilirubin Urine SMALL (*)    Ketones, ur >80 (*)    Protein, ur 100 (*)    Leukocytes, UA LARGE (*)    All other components within normal limits  URINE MICROSCOPIC-ADD ON - Abnormal; Notable for the following:    Squamous Epithelial / LPF 6-30 (*)    Bacteria, UA MANY (*)    All other components within normal limits  URINE CULTURE  POC URINE PREG, ED  GC/CHLAMYDIA PROBE AMP ([place]) NOT AT [facility]    Imaging Review No results found. I have personally reviewed and evaluated these lab results as part of my medical decision-making.   MDM  25 y.o. female with hx of GC and Chlamydia exposure after previous treatment for GC and Chlamydia stable for d/c without fever or signs of PID. Will treat for UTI and cover for GC and Chlamydia Rocephin 1 gram IM and Zithromax 1 gram PO. Patient to f/u with the health department.   Final diagnoses:  STD exposure  UTI (lower urinary tract infection)   Patient treated in the ED for STI with Rocephin and Zithromax. Patient advised to inform and treat all sexual partners.  Pt advised on safe sex practices and understands that they have GC/Chlamydia cultures pending  and will result in 2-3 days. Pt encouraged to follow up at local health department for future STI checks. No concern for PID. Discussed return precautions. Pt appears safe for discharge.   I personally performed the services described in this documentation, which was scribed in my presence. The recorded information has been reviewed and is accurate.     [place], [place] 08/27/15 2147  Richardean Canal, MD 08/27/15 [phone]

## 2015-08-27 NOTE — Discharge Instructions (Signed)
Follow up with the health department for a pap smear and continued health care.

## 2015-08-27 NOTE — ED Notes (Signed)
Patient verbalized understanding of discharge instructions and denies any further needs or questions at this time. VS stable. Patient ambulatory with steady gait.  

## 2015-08-28 LAB — GC/CHLAMYDIA PROBE AMP (~~LOC~~) NOT AT ARMC
CHLAMYDIA, DNA PROBE: NEGATIVE
NEISSERIA GONORRHEA: NEGATIVE

## 2015-08-30 LAB — URINE CULTURE

## 2015-08-31 ENCOUNTER — Telehealth: Payer: Self-pay | Admitting: *Deleted

## 2015-08-31 NOTE — ED Notes (Signed)
Post ED Visit - Positive Culture Follow-up: Successful Patient Follow-Up  Culture assessed and recommendations reviewed by: []  Enzo BiNathan Batchelder, Pharm.D. []  Celedonio MiyamotoJeremy Frens, Pharm.D., BCPS []  Garvin FilaMike Maccia, Pharm.D. [x]  Georgina PillionElizabeth Martin, Pharm.D., BCPS []  Clay SpringsMinh Pham, [address]Pharm[address]D., BCPS, AAHIVP []  Estella HuskMichelle Turner, Pharm.D., BCPS, AAHIVP []  Tennis Mustassie Stewart, [address]Pharm.D. []  Sherle Poeob Vincent, [place]arm[place]D.  Positive urine culture  [x]  Patient discharged without antimicrobial prescription and treatment is now indicated []  Organism is resistant to prescribed ED discharge antimicrobial []  Patient with positive blood cultures  Changes discussed with ED provider: Audry Piliyler Mohr, Tara Sanchez New antibiotic prescription Amoxicillin 500mg  PO BID x 7 days [facility] [address], [phone]  Contacted patient, date 08/31/2015, time 1114   Tara PearlRobertson, Tara Sanchez 08/31/2015, 11:13 AM

## 2015-08-31 NOTE — Progress Notes (Signed)
ED Antimicrobial Stewardship Positive Culture Follow Up   Tara Sanchez is an 25 y.o. female who [facility] with a chief complaint of  Chief Complaint  Patient presents with  . SEXUALLY TRANSMITTED DISEASE  . Dysuria    Recent Results (from the past 720 hour(s))  Urine culture     Status: Abnormal   Collection Time: 08/27/15  4:58 PM  Result Value Ref Range Status   Specimen Description URINE, CLEAN CATCH  Final   Special Requests NONE  Final   Culture >=100,000 COLONIES/mL ENTEROCOCCUS SPECIES (A)  Final   Report Status 08/30/2015 FINAL  Final   Organism ID, Bacteria ENTEROCOCCUS SPECIES (A)  Final      Susceptibility   Enterococcus species - MIC*    AMPICILLIN <=2 SENSITIVE Sensitive     LEVOFLOXACIN 1 SENSITIVE Sensitive     NITROFURANTOIN <=16 SENSITIVE Sensitive     VANCOMYCIN 2 SENSITIVE Sensitive     * >=100,000 COLONIES/mL ENTEROCOCCUS SPECIES  Wet prep, genital     Status: Abnormal   Collection Time: 08/27/15  8:51 PM  Result Value Ref Range Status   Yeast Wet Prep HPF POC NONE SEEN NONE SEEN Final   Trich, Wet Prep NONE SEEN NONE SEEN Final   Clue Cells Wet Prep HPF POC PRESENT (A) NONE SEEN Final   WBC, Wet Prep HPF POC MANY (A) NONE SEEN Final   Sperm NONE SEEN  Final    [x]  Patient discharged originally without antimicrobial agent and treatment is now indicated  New antibiotic prescription: Amoxicillin 500 mg po twice daily for 7 days  ED Provider: Audry Piliyler Mohr, PA-C   Tara Sanchez, Tara Sanchez 08/31/2015, 9:21 AM Infectious Diseases Pharmacist Phone# [phone]

## 2015-11-27 ENCOUNTER — Emergency Department (HOSPITAL_COMMUNITY)
Admission: EM | Admit: 2015-11-27 | Discharge: 2015-11-27 | Disposition: A | Discharge status: Home / Self Care | Payer: Medicaid Other | Attending: Emergency Medicine | Admitting: Emergency Medicine

## 2015-11-27 ENCOUNTER — Encounter (HOSPITAL_COMMUNITY): Payer: Self-pay | Admitting: Vascular Surgery

## 2015-11-27 DIAGNOSIS — N760 Acute vaginitis: Secondary | ICD-10-CM

## 2015-11-27 DIAGNOSIS — Z87891 Personal history of nicotine dependence: Secondary | ICD-10-CM | POA: Insufficient documentation

## 2015-11-27 DIAGNOSIS — B9689 Other specified bacterial agents as the cause of diseases classified elsewhere: Secondary | ICD-10-CM

## 2015-11-27 LAB — WET PREP, GENITAL
SPERM: NONE SEEN
Trich, Wet Prep: NONE SEEN
Yeast Wet Prep HPF POC: NONE SEEN

## 2015-11-27 LAB — URINALYSIS, ROUTINE W REFLEX MICROSCOPIC
BILIRUBIN URINE: NEGATIVE
Glucose, UA: NEGATIVE mg/dL
HGB URINE DIPSTICK: NEGATIVE
Ketones, ur: 15 mg/dL — AB
NITRITE: NEGATIVE
PROTEIN: NEGATIVE mg/dL
SPECIFIC GRAVITY, URINE: 1.028 (ref 1.005–1.030)
pH: 6.5 (ref 5.0–8.0)

## 2015-11-27 LAB — POC URINE PREG, ED: PREG TEST UR: NEGATIVE

## 2015-11-27 LAB — URINE MICROSCOPIC-ADD ON: RBC / HPF: NONE SEEN RBC/hpf (ref 0–5)

## 2015-11-27 MED ORDER — STERILE WATER FOR INJECTION IJ SOLN
INTRAMUSCULAR | Status: AC
  Administered 2015-11-27: 10 mL
  Filled 2015-11-27: qty 10

## 2015-11-27 MED ORDER — AZITHROMYCIN 250 MG PO TABS
1000.0000 mg | ORAL_TABLET | Freq: Once | ORAL | Status: AC
  Administered 2015-11-27: 1000 mg via ORAL
  Filled 2015-11-27: qty 4

## 2015-11-27 MED ORDER — METRONIDAZOLE 500 MG PO TABS: 500.0000 mg | ORAL_TABLET | Freq: Two times a day (BID) | ORAL | 0 refills | Status: DC

## 2015-11-27 MED ORDER — CEFTRIAXONE SODIUM 250 MG IJ SOLR
250.0000 mg | Freq: Once | INTRAMUSCULAR | Status: AC
  Administered 2015-11-27: 250 mg via INTRAMUSCULAR
  Filled 2015-11-27: qty 250

## 2015-11-27 NOTE — ED Triage Notes (Signed)
Pt reports to the ED for eval of vaginal itching, erythema, or pain. Pt reports she has been using condoms and her friend thinks it could be a latex reaction. Pt did have unprotected sex as well and would like to be evaluated for this. Pt denies any abd pain, vaginal d/c, bleeding, N/V/D, or urinary symptoms.

## 2015-11-27 NOTE — ED Provider Notes (Signed)
[facility]EMERGENCY DEPT Provider Note   CSN: [id number] Arrival date & time: 11/27/15  1653  First Provider Contact:  First MD Initiated Contact with Patient 11/27/15 1833     History   Chief Complaint Chief Complaint  Patient presents with  . Vaginal Discharge    HPI Tara Sanchez is a 25 y.o. female.  HPI 25 y.o. female presents to the Emergency Department today complaining of vaginal itching x1 week. Notes recent uses of latex condom with boyfriend. No hx of similar reaction. No dysuria. No vaginal discharge. No abdominal pain. No N/V/D. No fevers. Does not endorse pain currently. Pt wishes to be checked for STDs. Pt sexually active with one partner. No other symptoms noted.   Past Medical History:  Diagnosis Date  . Genital herpes   . Headache   . Infection    UTI    Patient Active Problem List   Diagnosis Date Noted  . Active labor at term 04/26/2015  . SVD (spontaneous vaginal delivery) 04/26/2015  . Preterm delivery, delivered 04/26/2015  . Positive GBS test 04/25/2015  . Threatened preterm labor 04/04/2015  . Preterm labor 03/28/2015  . Viral disease during pregnancy in second trimester, antepartum 01/29/2015  . Fever and chills 01/25/2015  . Preterm delivery--hx 31 week delivery 01/09/2015  . H/O 2 abortions 01/09/2015  . Maternal varicella, non-immune 01/09/2015  . Allergy history, drug--Phergan, Tessalon [place] 01/09/2015    Past Surgical History:  Procedure Laterality Date  . THERAPEUTIC ABORTION      OB History    Gravida Para Term Preterm AB Living   4 2 0 SAB TAB Ectopic Multiple Live Births     2   0 2       Home Medications    Prior to Admission medications   Medication Sig Start Date End Date Taking? Authorizing Provider  cefTRIAXone (ROCEPHIN) 250 MG injection Inject 250 mg into the muscle once.  FOR IM use in LARGE MUSCLE MASS 07/24/15   Marlis Edelson, CNM  metroNIDAZOLE (FLAGYL) 500 MG tablet Take 1 tablet (500 mg total) by mouth  2 (two) times daily. 07/23/15   Judeth Horn, NP  Prenatal Vit-Fe Fumarate-FA (PRENATAL MULTIVITAMIN) TABS tablet Take 1 tablet by mouth daily.     Historical Provider, MD    Family History Family History  Problem Relation Age of Onset  . Diabetes Paternal Grandmother   . Asthma Neg Hx   . Cancer Neg Hx   . Heart disease Neg Hx   . Hypertension Neg Hx   . Stroke Neg Hx   . Alcohol abuse Neg Hx   . Arthritis Neg Hx   . Birth defects Neg Hx   . COPD Neg Hx   . Depression Neg Hx   . Drug abuse Neg Hx   . Early death Neg Hx   . Hearing loss Neg Hx   . Hyperlipidemia Neg Hx   . Kidney disease Neg Hx   . Learning disabilities Neg Hx   . Mental illness Neg Hx   . Mental retardation Neg Hx   . Miscarriages / Stillbirths Neg Hx   . Vision loss Neg Hx   . Varicose Veins Neg Hx     Social History Social History  Substance Use Topics  . Smoking status: Former Smoker    Years: 2.00    Types: Cigars  . Smokeless tobacco: Never Used  . Alcohol use No     Allergies  Macrobid [nitrofurantoin]; Phenergan [promethazine hcl]; and Tessalon [benzonatate]   Review of Systems Review of Systems ROS reviewed and all are negative for acute change except as noted in the HPI.  Physical Exam Updated Vital Signs BP 94/63   Pulse 72   Temp 99 F (37.2 C) (Oral)   Resp 18   Ht 5\' 7"  (1.702 m)   Wt 66.2 kg   SpO2 100%   BMI 22.87 kg/m   Physical Exam  Constitutional: She is oriented to person, place, and time. Vital signs are normal. She appears well-developed and well-nourished.  HENT:  Head: Normocephalic.  Right Ear: Hearing normal.  Left Ear: Hearing normal.  Eyes: Conjunctivae and EOM are normal. Pupils are equal, round, and reactive to light.  Cardiovascular: Normal rate and regular rhythm.   Pulmonary/Chest: Effort normal.  Abdominal: Soft. There is no tenderness.  Neurological: She is alert and oriented to person, place, and time.  Skin: Skin is warm and dry.    Psychiatric: She has a normal mood and affect. Her speech is normal and behavior is normal. Thought content normal.   Exam performed by Eston Estersyler M Kaiven Vester,  exam chaperoned Date: 11/27/2015 Pelvic exam: normal external genitalia without evidence of trauma. VULVA: normal appearing vulva with no masses, tenderness or lesion. VAGINA: normal appearing vagina with normal color and discharge, no lesions. CERVIX: normal appearing cervix without lesions, cervical motion tenderness absent, cervical os closed with out purulent discharge; vaginal discharge - copious and grey, Wet prep and DNA probe for chlamydia and GC obtained.   ADNEXA: normal adnexa in size, nontender and no masses UTERUS: uterus is normal size, shape, consistency and nontender.   ED Treatments / Results  Labs (all labs ordered are listed, but only abnormal results are displayed) Labs Reviewed  WET PREP, GENITAL - Abnormal; Notable for the following:       Result Value   Clue Cells Wet Prep HPF POC PRESENT (*)    WBC, Wet Prep HPF POC MANY (*)    All other components within normal limits  URINALYSIS, ROUTINE W REFLEX MICROSCOPIC (NOT AT [facility]) - Abnormal; Notable for the following:    APPearance CLOUDY (*)    Ketones, ur 15 (*)    Leukocytes, UA TRACE (*)    All other components within normal limits  URINE MICROSCOPIC-ADD ON - Abnormal; Notable for the following:    Squamous Epithelial / LPF 6-30 (*)    Bacteria, UA MANY (*)    All other components within normal limits  POC URINE PREG, ED  GC/CHLAMYDIA PROBE AMP ([place]) NOT AT [facility]   EKG  EKG Interpretation None      Radiology No results found.  Procedures Procedures (including critical care time)  Medications Ordered in ED Medications - No data to display   Initial Impression / Assessment and Plan / ED Course  I have reviewed the triage vital signs and the nursing notes.  Pertinent labs & imaging results that were available during my care of the patient  were reviewed by me and considered in my medical decision making (see chart for details).  Clinical Course   Final Clinical Impressions(s) / ED Diagnoses  I have reviewed and evaluated the relevant laboratory valuesI have reviewed the relevant previous healthcare records.I obtained HPI from historian.  ED Course:  Assessment: Pt is a 25yF who presents with vaginal itching x 1 week after latex condome use. No hx same. No vaginal bleeding/discharge. No dysuria. No fevers. No N/V. On  exam, pt in NAD. Nontoxic/nonseptic appearing. VSS. Afebrile. Abdomen nontender soft. GU exam with copious discharge noted. No CMT/Adnexal. GC obtained. Treated with Azithro/Rocephin. UA unremarkable. Likely contaminated. Preg negative. Wet Prep with BV. Treated for suspect GC with rocephin/azithro. Plan is to DC home with follow up to PCP. Given Rx Flagyl. At time of discharge, Patient is in no acute distress. Vital Signs are stable. Patient is able to ambulate. Patient able to tolerate PO.    Disposition/Plan:  DC Home Additional Verbal discharge instructions given and discussed with patient.  Pt Instructed to f/u with PCP in the next week for evaluation and treatment of symptoms. Return precautions given Pt acknowledges and agrees with plan  Supervising Physician Donnetta Hutching, MD   Final diagnoses:  BV (bacterial vaginosis)    New Prescriptions New Prescriptions   No medications on file     Audry Pili, PA-C 11/27/15 2032    Donnetta Hutching, MD 11/28/15 1651

## 2015-11-27 NOTE — ED Notes (Signed)
Pelvic cart at bedside and same is set up.  Pt in restroom obtaining urine sample.

## 2015-11-27 NOTE — Discharge Instructions (Signed)
Please read and follow all provided instructions.  Your diagnoses today include:  1. BV (bacterial vaginosis)    Tests performed today include: Test for gonorrhea and chlamydia. You will be notified by telephone if you have a positive result. Vital signs. See below for your results today.   Medications:  You were treated for chlamydia (1 gram azithromycin pills) and gonorrhea (250mg  rocephin shot).  Take medication for Bacterial Vaginosis as prescribed   Home care instructions:  Read educational materials contained in this packet and follow any instructions provided.   STD Testing: [facility] [place] [place] [facility] [address], [place], phone [phone] or [phone]   Monday - Friday, call for an appointment [facility] [place] [place] [facility] [address], [place], phone [phone] or [phone]  Monday - Friday, call for an appointment  Return instructions:  Please return to the Emergency Department if you experience worsening symptoms.  Please return if you have any other emergent concerns.  Additional Information:  Your vital signs today were: BP 105/60    Pulse 75    Temp 99 F (37.2 C) (Oral)    Resp 16    Ht 5\' 7"  (1.702 m)    Wt 66.2 kg    SpO2 100%    BMI 22.87 kg/m  If your blood pressure (BP) was elevated above 135/85 this visit, please have this repeated by your doctor within one month. --------------

## 2015-11-28 LAB — GC/CHLAMYDIA PROBE AMP (~~LOC~~) NOT AT ARMC
CHLAMYDIA, DNA PROBE: NEGATIVE
NEISSERIA GONORRHEA: NEGATIVE

## 2015-12-21 ENCOUNTER — Encounter (HOSPITAL_COMMUNITY): Payer: Self-pay

## 2015-12-21 ENCOUNTER — Inpatient Hospital Stay (HOSPITAL_COMMUNITY)
Admission: AD | Admit: 2015-12-21 | Discharge: 2015-12-21 | Disposition: A | Discharge status: Home / Self Care | Payer: Medicaid Other | Source: Ambulatory Visit | Attending: Obstetrics & Gynecology | Admitting: Obstetrics & Gynecology

## 2015-12-21 DIAGNOSIS — N912 Amenorrhea, unspecified: Secondary | ICD-10-CM | POA: Diagnosis not present

## 2015-12-21 DIAGNOSIS — Z87891 Personal history of nicotine dependence: Secondary | ICD-10-CM | POA: Insufficient documentation

## 2015-12-21 DIAGNOSIS — A499 Bacterial infection, unspecified: Secondary | ICD-10-CM

## 2015-12-21 DIAGNOSIS — Z79899 Other long term (current) drug therapy: Secondary | ICD-10-CM | POA: Insufficient documentation

## 2015-12-21 DIAGNOSIS — R51 Headache: Secondary | ICD-10-CM

## 2015-12-21 DIAGNOSIS — B9689 Other specified bacterial agents as the cause of diseases classified elsewhere: Secondary | ICD-10-CM

## 2015-12-21 DIAGNOSIS — R519 Headache, unspecified: Secondary | ICD-10-CM

## 2015-12-21 DIAGNOSIS — R1030 Lower abdominal pain, unspecified: Secondary | ICD-10-CM | POA: Insufficient documentation

## 2015-12-21 DIAGNOSIS — Z888 Allergy status to other drugs, medicaments and biological substances status: Secondary | ICD-10-CM | POA: Insufficient documentation

## 2015-12-21 DIAGNOSIS — G8929 Other chronic pain: Secondary | ICD-10-CM

## 2015-12-21 DIAGNOSIS — R102 Pelvic and perineal pain: Secondary | ICD-10-CM | POA: Diagnosis not present

## 2015-12-21 DIAGNOSIS — N760 Acute vaginitis: Secondary | ICD-10-CM

## 2015-12-21 DIAGNOSIS — Z9889 Other specified postprocedural states: Secondary | ICD-10-CM | POA: Insufficient documentation

## 2015-12-21 LAB — WET PREP, GENITAL
Sperm: NONE SEEN
Trich, Wet Prep: NONE SEEN
Yeast Wet Prep HPF POC: NONE SEEN

## 2015-12-21 LAB — URINALYSIS, ROUTINE W REFLEX MICROSCOPIC
BILIRUBIN URINE: NEGATIVE
Glucose, UA: NEGATIVE mg/dL
Hgb urine dipstick: NEGATIVE
Ketones, ur: NEGATIVE mg/dL
LEUKOCYTES UA: NEGATIVE
NITRITE: NEGATIVE
PH: 7 (ref 5.0–8.0)
Protein, ur: NEGATIVE mg/dL
SPECIFIC GRAVITY, URINE: 1.02 (ref 1.005–1.030)

## 2015-12-21 LAB — POCT PREGNANCY, URINE: Preg Test, Ur: NEGATIVE

## 2015-12-21 MED ORDER — METRONIDAZOLE 500 MG PO TABS: 500.0000 mg | ORAL_TABLET | Freq: Two times a day (BID) | ORAL | 0 refills | Status: DC

## 2015-12-21 NOTE — MAU Note (Signed)
Pt has had abdominal pain and was recently tested for GC/Chlamydia and was negative.  Two days ago she had bleeding but only for 2 hours and has had no other bleeding for a period.

## 2015-12-21 NOTE — MAU Provider Note (Signed)
History     CSN: [id number]  Arrival date and time: 12/21/15 1629   None     Chief Complaint  Patient presents with  . Abdominal Pain   [id number] non-pregnant female here because menses is late and having lower abdominal pain. She reports intermittent uterine cramping and HA over the last month, she takes Ibuprofen with good relief. She reports thin, white vaginal discharge with odor. She denies new partners. She does not use condoms or contraception. She denies fever. She denies urinary sx. She was seen about 1 month ago in the ED, dx with BV but lost prescription. She does not want to be pregnant and would like to start reliable contraception.    Pertinent Gynecological History: Menses: 11/16/15 Contraception: none Sexually transmitted diseases: past history: HSV Last pap: never    Past Medical History:  Diagnosis Date  . Genital herpes   . Headache   . Infection    UTI    Past Surgical History:  Procedure Laterality Date  . THERAPEUTIC ABORTION      Family History  Problem Relation Age of Onset  . Diabetes Paternal Grandmother   . Asthma Neg Hx   . Cancer Neg Hx   . Heart disease Neg Hx   . Hypertension Neg Hx   . Stroke Neg Hx   . Alcohol abuse Neg Hx   . Arthritis Neg Hx   . Birth defects Neg Hx   . COPD Neg Hx   . Depression Neg Hx   . Drug abuse Neg Hx   . Early death Neg Hx   . Hearing loss Neg Hx   . Hyperlipidemia Neg Hx   . Kidney disease Neg Hx   . Learning disabilities Neg Hx   . Mental illness Neg Hx   . Mental retardation Neg Hx   . Miscarriages / Stillbirths Neg Hx   . Vision loss Neg Hx   . Varicose Veins Neg Hx     Social History  Substance Use Topics  . Smoking status: Former Smoker    Years: 2.00    Types: Cigars  . Smokeless tobacco: Never Used  . Alcohol use No    Allergies:  Allergies  Allergen Reactions  . Macrobid [Nitrofurantoin] Anaphylaxis  . Phenergan [Promethazine Hcl] Other (See Comments)    Reaction:  Severe  sedation  . Tessalon [Benzonatate] Other (See Comments)    Reaction:  Severe sedation    Prescriptions Prior to Admission  Medication Sig Dispense Refill Last Dose  . cefTRIAXone (ROCEPHIN) 250 MG injection Inject 250 mg into the muscle once.  FOR IM use in LARGE MUSCLE MASS 1 each 0 Taking  . metroNIDAZOLE (FLAGYL) 500 MG tablet Take 1 tablet (500 mg total) by mouth 2 (two) times daily. 14 tablet 0   . Prenatal Vit-Fe Fumarate-FA (PRENATAL MULTIVITAMIN) TABS tablet Take 1 tablet by mouth daily.    Taking    Review of Systems  Constitutional: Negative.   Gastrointestinal: Positive for abdominal pain. Negative for constipation and diarrhea.  Genitourinary: Negative.   Neurological: Positive for headaches.   Physical Exam   Blood pressure 118/57, pulse 76, temperature 98.7 F (37.1 C), resp. rate 16, height 5\' 7"  (1.702 m), weight 71 kg (156 lb 8 oz), last menstrual period 11/16/2015, not currently breastfeeding.  Physical Exam  Constitutional: She is oriented to person, place, and time. She appears well-developed and well-nourished.  HENT:  Head: Normocephalic and atraumatic.  Neck: Normal range of motion. Neck supple.  Cardiovascular: Normal rate.   Respiratory: Effort normal.  GI: Soft. She exhibits no distension and no mass. There is no tenderness. There is no rebound and no guarding.  Genitourinary: Vagina normal and uterus normal.  Genitourinary Comments: External: no lesions Vagina: rugated, parous, creamy odorous discharge Uterus: non enlarged, anteverted, non tender, no CMT Adnexae: no masses, no tenderness left, no tenderness right   Musculoskeletal: Normal range of motion.  Neurological: She is alert and oriented to person, place, and time.  Skin: Skin is warm and dry.  Psychiatric: She has a normal mood and affect.   Results for orders placed or performed during the hospital encounter of 12/21/15 (from the past 24 hour(s))  Urinalysis, Routine w reflex microscopic  ([facility])     Status: Abnormal   Collection Time: 12/21/15  4:37 PM  Result Value Ref Range   Color, Urine YELLOW YELLOW   APPearance HAZY (A) CLEAR   Specific Gravity, Urine 1.020 1.005 - 1.030   pH 7.0 5.0 - 8.0   Glucose, UA NEGATIVE NEGATIVE mg/dL   Hgb urine dipstick NEGATIVE NEGATIVE   Bilirubin Urine NEGATIVE NEGATIVE   Ketones, ur NEGATIVE NEGATIVE mg/dL   Protein, ur NEGATIVE NEGATIVE mg/dL   Nitrite NEGATIVE NEGATIVE   Leukocytes, UA NEGATIVE NEGATIVE  Pregnancy, urine POC     Status: None   Collection Time: 12/21/15  5:00 PM  Result Value Ref Range   Preg Test, Ur NEGATIVE NEGATIVE  Wet prep, genital     Status: Abnormal   Collection Time: 12/21/15  5:07 PM  Result Value Ref Range   Yeast Wet Prep HPF POC NONE SEEN NONE SEEN   Trich, Wet Prep NONE SEEN NONE SEEN   Clue Cells Wet Prep HPF POC PRESENT (A) NONE SEEN   WBC, Wet Prep HPF POC FEW (A) NONE SEEN   Sperm NONE SEEN     MAU Course  Procedures  MDM Labs ordered and reviewed. No evidence of acute abd or pelvic process. No evidence of UTI. Cramping likely d/t BV. Stable for discharge home.  Assessment and Plan   1. Pelvic pain in female   2. Bacterial vaginosis   3. Chronic nonintractable headache, unspecified headache type   4. Amenorrhea    Discharge home Tylenol or Ibuprofen prn pain Flagyl 500 mg po bid x7 days Recommend HPT in 1 week if no menses Abstain from IC until complete abx regimen, then use condoms consistently Follow up with OB/GYN provider of choice for contraceptive mngt-list provided Return to MAU for emergent sx  Donette LarryMelanie Georgianna Band, CNM 12/21/2015, 5:12 PM

## 2015-12-21 NOTE — Discharge Instructions (Signed)
Secondary Amenorrhea Secondary amenorrhea is the stopping of menstrual flow for 3-6 months in a female who has previously had periods. There are many possible causes. Most of these causes are not serious. Usually, treating the underlying problem causing the loss of menses will return your periods to normal. CAUSES  Some common and uncommon causes of not menstruating include:  Malnutrition.  Low blood sugar (hypoglycemia).  Polycystic ovary disease.  Stress or fear.  Breastfeeding.  Hormone imbalance.  Ovarian failure.  Medicines.  Extreme obesity.  Cystic fibrosis.  Low body weight or drastic weight reduction from any cause.  Early menopause.  Removal of ovaries or uterus.  Contraceptives.  Illness.  Long-term (chronic) illnesses.  Cushing syndrome.  Thyroid problems.  Birth control pills, patches, or vaginal rings for birth control. RISK FACTORS You may be at greater risk of secondary amenorrhea if:  You have a family history of this condition.  You have an eating disorder.  You do athletic training. DIAGNOSIS  A diagnosis is made by your health care provider taking a medical history and doing a physical exam. This will include a pelvic exam to check for problems with your reproductive organs. Pregnancy must be ruled out. Often, numerous blood tests are done to measure different hormones in the body. Urine testing may be done. Specialized exams (ultrasound, CT scan, MRI, or hysteroscopy) may have to be done as well as measuring the body mass index (BMI). TREATMENT  Treatment depends on the cause of the amenorrhea. If an eating disorder is present, this can be treated with an adequate diet and therapy. Chronic illnesses may improve with treatment of the illness. Amenorrhea may be corrected with medicines, lifestyle changes, or surgery. If the amenorrhea cannot be corrected, it is sometimes possible to create a false menstruation with medicines. HOME CARE  INSTRUCTIONS  Maintain a healthy diet.  Manage weight problems.  Exercise regularly but not excessively.  Get adequate sleep.  Manage stress.  Be aware of changes in your menstrual cycle. Keep a record of when your periods occur. Note the date your period starts, how long it lasts, and any problems. SEEK MEDICAL CARE IF: Your symptoms do not get better with treatment.   This information is not intended to replace advice given to you by your health care provider. Make sure you discuss any questions you have with your health care provider.   Document Released: 05/19/2006 Document Revised: 04/28/2014 Document Reviewed: 09/23/2012 Elsevier Interactive Patient Education 2016 Elsevier Inc[employer] General Headache Without Cause A headache is pain or discomfort felt around the head or neck area. The specific cause of a headache may not be found. There are many causes and types of headaches. A few common ones are:  Tension headaches.  Migraine headaches.  Cluster headaches.  Chronic daily headaches. HOME CARE INSTRUCTIONS  Watch your condition for any changes. Take these steps to help with your condition: Managing Pain  Take over-the-counter and prescription medicines only as told by your health care provider.  Lie down in a dark, quiet room when you have a headache.  If directed, apply ice to the head and neck area:  Put ice in a plastic bag.  Place a towel between your skin and the bag.  Leave the ice on for 20 minutes, 2-3 times per day.  Use a heating pad or hot shower to apply heat to the head and neck area as told by your health care provider.  Keep lights dim if bright lights bother you or  make your headaches worse. Eating and Drinking  Eat meals on a regular schedule.  Limit alcohol use.  Decrease the amount of caffeine you drink, or stop drinking caffeine. General Instructions  Keep all follow-up visits as told by your health care provider. This is  important.  Keep a headache journal to help find out what may trigger your headaches. For example, write down:  What you eat and drink.  How much sleep you get.  Any change to your diet or medicines.  Try massage or other relaxation techniques.  Limit stress.  Sit up straight, and do not tense your muscles.  Do not use tobacco products, including cigarettes, chewing tobacco, or e-cigarettes. If you need help quitting, ask your health care provider.  Exercise regularly as told by your health care provider.  Sleep on a regular schedule. Get 7-9 hours of sleep, or the amount recommended by your health care provider. SEEK MEDICAL CARE IF:   Your symptoms are not helped by medicine.  You have a headache that is different from the usual headache.  You have nausea or you vomit.  You have a fever. SEEK IMMEDIATE MEDICAL CARE IF:   Your headache becomes severe.  You have repeated vomiting.  You have a stiff neck.  You have a loss of vision.  You have problems with speech.  You have pain in the eye or ear.  You have muscular weakness or loss of muscle control.  You lose your balance or have trouble walking.  You feel faint or pass out.  You have confusion.   This information is not intended to replace advice given to you by your health care provider. Make sure you discuss any questions you have with your health care provider.   Document Released: 04/07/2005 Document Revised: 12/27/2014 Document Reviewed: 07/31/2014 Elsevier Interactive Patient Education 2016 Elsevier Inc. Bacterial Vaginosis Bacterial vaginosis is a vaginal infection that occurs when the normal balance of bacteria in the vagina is disrupted. It results from an overgrowth of certain bacteria. This is the most common vaginal infection in women of childbearing age. Treatment is important to prevent complications, especially in pregnant women, as it can cause a premature delivery. CAUSES  Bacterial  vaginosis is caused by an increase in harmful bacteria that are normally present in smaller amounts in the vagina. Several different kinds of bacteria can cause bacterial vaginosis. However, the reason that the condition develops is not fully understood. RISK FACTORS Certain activities or behaviors can put you at an increased risk of developing bacterial vaginosis, including:  Having a new sex partner or multiple sex partners.  Douching.  Using an intrauterine device (IUD) for contraception. Women do not get bacterial vaginosis from toilet seats, bedding, swimming pools, or contact with objects around them. SIGNS AND SYMPTOMS  Some women with bacterial vaginosis have no signs or symptoms. Common symptoms include:  Grey vaginal discharge.  A fishlike odor with discharge, especially after sexual intercourse.  Itching or burning of the vagina and vulva.  Burning or pain with urination. DIAGNOSIS  Your health care provider will take a medical history and examine the vagina for signs of bacterial vaginosis. A sample of vaginal fluid may be taken. Your health care provider will look at this sample under a microscope to check for bacteria and abnormal cells. A vaginal pH test may also be done.  TREATMENT  Bacterial vaginosis may be treated with antibiotic medicines. These may be given in the form of a pill or a vaginal  cream. A second round of antibiotics may be prescribed if the condition comes back after treatment. Because bacterial vaginosis increases your risk for sexually transmitted diseases, getting treated can help reduce your risk for chlamydia, gonorrhea, HIV, and herpes. HOME CARE INSTRUCTIONS   Only take over-the-counter or prescription medicines as directed by your health care provider.  If antibiotic medicine was prescribed, take it as directed. Make sure you finish it even if you start to feel better.  Tell all sexual partners that you have a vaginal infection. They should see  their health care provider and be treated if they have problems, such as a mild rash or itching.  During treatment, it is important that you follow these instructions:  Avoid sexual activity or use condoms correctly.  Do not douche.  Avoid alcohol as directed by your health care provider.  Avoid breastfeeding as directed by your health care provider. SEEK MEDICAL CARE IF:   Your symptoms are not improving after 3 days of treatment.  You have increased discharge or pain.  You have a fever. MAKE SURE YOU:   Understand these instructions.  Will watch your condition.  Will get help right away if you are not doing well or get worse. FOR MORE INFORMATION  Centers for Disease Control and Prevention, Division of STD Prevention: [employer] American Sexual Health Association (ASHA): www.ashastd.org    This information is not intended to replace advice given to you by your health care provider. Make sure you discuss any questions you have with your health care provider.   Document Released: 04/07/2005 Document Revised: 04/28/2014 Document Reviewed: 11/17/2012 Elsevier Interactive Patient Education [employer]

## 2015-12-25 LAB — GC/CHLAMYDIA PROBE AMP (~~LOC~~) NOT AT ARMC
Chlamydia: NEGATIVE
Neisseria Gonorrhea: NEGATIVE

## 2016-02-04 ENCOUNTER — Encounter (HOSPITAL_COMMUNITY): Payer: Self-pay

## 2016-02-04 ENCOUNTER — Inpatient Hospital Stay (HOSPITAL_COMMUNITY)
Admission: AD | Admit: 2016-02-04 | Discharge: 2016-02-04 | Disposition: A | Discharge status: Home / Self Care | Payer: Medicaid Other | Source: Ambulatory Visit | Attending: Family Medicine | Admitting: Family Medicine

## 2016-02-04 DIAGNOSIS — Z87891 Personal history of nicotine dependence: Secondary | ICD-10-CM | POA: Diagnosis not present

## 2016-02-04 DIAGNOSIS — R1084 Generalized abdominal pain: Secondary | ICD-10-CM | POA: Diagnosis not present

## 2016-02-04 DIAGNOSIS — N898 Other specified noninflammatory disorders of vagina: Secondary | ICD-10-CM | POA: Diagnosis present

## 2016-02-04 DIAGNOSIS — Z3202 Encounter for pregnancy test, result negative: Secondary | ICD-10-CM | POA: Diagnosis not present

## 2016-02-04 LAB — HCG, QUANTITATIVE, PREGNANCY: hCG, Beta Chain, Quant, S: <1 m[IU]/mL (ref <5)

## 2016-02-04 LAB — URINALYSIS, ROUTINE W REFLEX MICROSCOPIC
BILIRUBIN URINE: NEGATIVE
Glucose, UA: NEGATIVE mg/dL
HGB URINE DIPSTICK: NEGATIVE
Ketones, ur: NEGATIVE mg/dL
Nitrite: NEGATIVE
PROTEIN: NEGATIVE mg/dL
Specific Gravity, Urine: 1.025 (ref 1.005–1.030)
pH: 6 (ref 5.0–8.0)

## 2016-02-04 LAB — URINE MICROSCOPIC-ADD ON

## 2016-02-04 LAB — WET PREP, GENITAL
CLUE CELLS WET PREP: NONE SEEN
SPERM: NONE SEEN
TRICH WET PREP: NONE SEEN
Yeast Wet Prep HPF POC: NONE SEEN

## 2016-02-04 LAB — POCT PREGNANCY, URINE: PREG TEST UR: NEGATIVE

## 2016-02-04 MED ORDER — ACETAMINOPHEN 500 MG PO TABS
1000.0000 mg | ORAL_TABLET | Freq: Once | ORAL | Status: AC
  Administered 2016-02-04: 1000 mg via ORAL
  Filled 2016-02-04: qty 2

## 2016-02-04 NOTE — Discharge Instructions (Signed)

## 2016-02-04 NOTE — MAU Provider Note (Signed)
History     CSN: [id number]  Arrival date and time: 02/04/16 [id number]   First Provider Initiated Contact with Patient 02/04/16 1939      Chief Complaint  Patient presents with  . Vaginal Irritation   HPI  Pt is [id number] 25 y.o.Unknown pregnancy gestational age. Presents with +HPT and also c/o of recurrence of BV. Pt denies spotting, bleeding, cramping, or UTI sx, constipation or diarrhea Pt c/o of headache- she has frequent headaches and usually takes Motrin RN note:   [] Hide copied text [] Hover for attribution information Pt presents to MAU with what she believes to be BV. Denies any pain is but her vagina is irritated/itchy. Denies any abnormal discharge or odor. Took Monistat 2-3 days ago.  Pt had 2 + home pregnancy tests 2 days ago. Concerned with possibility that sexual partner may have STD       Past Medical History:  Diagnosis Date  . Genital herpes   . Headache   . Infection    UTI    Past Surgical History:  Procedure Laterality Date  . THERAPEUTIC ABORTION      Family History  Problem Relation Age of Onset  . Diabetes Paternal Grandmother   . Asthma Neg Hx   . Cancer Neg Hx   . Heart disease Neg Hx   . Hypertension Neg Hx   . Stroke Neg Hx   . Alcohol abuse Neg Hx   . Arthritis Neg Hx   . Birth defects Neg Hx   . COPD Neg Hx   . Depression Neg Hx   . Drug abuse Neg Hx   . Early death Neg Hx   . Hearing loss Neg Hx   . Hyperlipidemia Neg Hx   . Kidney disease Neg Hx   . Learning disabilities Neg Hx   . Mental illness Neg Hx   . Mental retardation Neg Hx   . Miscarriages / Stillbirths Neg Hx   . Vision loss Neg Hx   . Varicose Veins Neg Hx     Social History  Substance Use Topics  . Smoking status: Former Smoker    Years: 2.00    Types: Cigars  . Smokeless tobacco: Never Used  . Alcohol use Yes     Comment: Social    Allergies:  Allergies  Allergen Reactions  . Macrobid [Nitrofurantoin] Anaphylaxis  . Phenergan [Promethazine Hcl]  Other (See Comments)    Reaction:  Severe sedation  . Tessalon [Benzonatate] Other (See Comments)    Reaction:  Severe sedation    Prescriptions Prior to Admission  Medication Sig Dispense Refill Last Dose  . metroNIDAZOLE (FLAGYL) 500 MG tablet Take 1 tablet (500 mg total) by mouth 2 (two) times daily. 14 tablet 0     Review of Systems  Constitutional: Negative for chills and fever.  Gastrointestinal: Negative for abdominal pain, constipation, diarrhea, nausea and vomiting.  Genitourinary: Negative for dysuria.  Neurological: Positive for headaches. Negative for dizziness.   Physical Exam   Blood pressure 112/61, pulse 72, temperature 98.4 F (36.9 C), temperature source Oral, resp. rate 16, not currently breastfeeding.  Physical Exam  Nursing note and vitals reviewed. Constitutional: She is oriented to person, place, and time. She appears well-developed and well-nourished. No distress.  HENT:  Head: Normocephalic.  Eyes: Pupils are equal, round, and reactive to light.  Neck: Normal range of motion. Neck supple.  Cardiovascular: Normal rate.   Respiratory: Effort normal.  GI: Soft. She exhibits no distension. There is no tenderness.  There is no rebound and no guarding.  Musculoskeletal: Normal range of motion.  Neurological: She is alert and oriented to person, place, and time.  Skin: Skin is warm and dry.  Psychiatric: She has a normal mood and affect.    MAU Course  Procedures Tylenol for headache GC/chlamydia pending Results for orders placed or performed during the hospital encounter of 02/04/16 (from the past 24 hour(s))  Urinalysis, Routine w reflex microscopic (not at [facility])     Status: Abnormal   Collection Time: 02/04/16  6:35 PM  Result Value Ref Range   Color, Urine YELLOW YELLOW   APPearance CLEAR CLEAR   Specific Gravity, Urine 1.025 1.005 - 1.030   pH 6.0 5.0 - 8.0   Glucose, UA NEGATIVE NEGATIVE mg/dL   Hgb urine dipstick NEGATIVE NEGATIVE    Bilirubin Urine NEGATIVE NEGATIVE   Ketones, ur NEGATIVE NEGATIVE mg/dL   Protein, ur NEGATIVE NEGATIVE mg/dL   Nitrite NEGATIVE NEGATIVE   Leukocytes, UA SMALL (A) NEGATIVE  Urine microscopic-add on     Status: Abnormal   Collection Time: 02/04/16  6:35 PM  Result Value Ref Range   Squamous Epithelial / LPF 0-5 (A) NONE SEEN   WBC, UA 0-5 0 - 5 WBC/hpf   RBC / HPF 0-5 0 - 5 RBC/hpf   Bacteria, UA FEW (A) NONE SEEN  Pregnancy, urine POC     Status: None   Collection Time: 02/04/16  6:45 PM  Result Value Ref Range   Preg Test, Ur NEGATIVE NEGATIVE  Wet prep, genital     Status: Abnormal   Collection Time: 02/04/16  7:22 PM  Result Value Ref Range   Yeast Wet Prep HPF POC NONE SEEN NONE SEEN   Trich, Wet Prep NONE SEEN NONE SEEN   Clue Cells Wet Prep HPF POC NONE SEEN NONE SEEN   WBC, Wet Prep HPF POC MODERATE (A) NONE SEEN   Sperm NONE SEEN   serum HCG Care handed over to Thressa Sheller, CNM Assessment and Plan  +HPT Vaginal discharge  Delrick Dehart 02/04/2016, 7:39 PM

## 2016-02-04 NOTE — MAU Note (Signed)
Pt presents to MAU with what she believes to be BV. Denies any pain is but her vagina is irritated/itchy. Denies any abnormal discharge or odor. Took Monistat 2-3 days ago.  Pt had 2 + home pregnancy tests 2 days ago. Concerned with possibility that sexual partner may have STD.

## 2016-02-05 LAB — GC/CHLAMYDIA PROBE AMP (~~LOC~~) NOT AT ARMC
CHLAMYDIA, DNA PROBE: NEGATIVE
Neisseria Gonorrhea: NEGATIVE

## 2016-02-18 ENCOUNTER — Inpatient Hospital Stay (HOSPITAL_COMMUNITY)
Admission: AD | Admit: 2016-02-18 | Discharge: 2016-02-18 | Disposition: A | Discharge status: Home / Self Care | Payer: Medicaid Other | Source: Ambulatory Visit | Attending: Family Medicine | Admitting: Family Medicine

## 2016-02-18 ENCOUNTER — Encounter (HOSPITAL_COMMUNITY): Payer: Self-pay | Admitting: *Deleted

## 2016-02-18 DIAGNOSIS — R102 Pelvic and perineal pain: Secondary | ICD-10-CM | POA: Diagnosis present

## 2016-02-18 DIAGNOSIS — R109 Unspecified abdominal pain: Secondary | ICD-10-CM

## 2016-02-18 DIAGNOSIS — Z3A01 Less than 8 weeks gestation of pregnancy: Secondary | ICD-10-CM

## 2016-02-18 DIAGNOSIS — Z87891 Personal history of nicotine dependence: Secondary | ICD-10-CM | POA: Insufficient documentation

## 2016-02-18 DIAGNOSIS — O26899 Other specified pregnancy related conditions, unspecified trimester: Secondary | ICD-10-CM

## 2016-02-18 DIAGNOSIS — O26891 Other specified pregnancy related conditions, first trimester: Secondary | ICD-10-CM | POA: Diagnosis not present

## 2016-02-18 LAB — CBC
HEMATOCRIT: 37.9 % (ref 36.0–46.0)
Hemoglobin: 12.6 g/dL (ref 12.0–15.0)
MCH: 29.8 pg (ref 26.0–34.0)
MCHC: 33.2 g/dL (ref 30.0–36.0)
MCV: 89.6 fL (ref 78.0–100.0)
Platelets: 378 10*3/uL (ref 150–400)
RBC: 4.23 MIL/uL (ref 3.87–5.11)
RDW: 13.3 % (ref 11.5–15.5)
WBC: 12 10*3/uL — ABNORMAL HIGH (ref 4.0–10.5)

## 2016-02-18 LAB — URINALYSIS, ROUTINE W REFLEX MICROSCOPIC
BILIRUBIN URINE: NEGATIVE
Glucose, UA: NEGATIVE mg/dL
HGB URINE DIPSTICK: NEGATIVE
KETONES UR: NEGATIVE mg/dL
Leukocytes, UA: NEGATIVE
Nitrite: NEGATIVE
PH: 6 (ref 5.0–8.0)
Protein, ur: NEGATIVE mg/dL
SPECIFIC GRAVITY, URINE: 1.025 (ref 1.005–1.030)

## 2016-02-18 LAB — HCG, QUANTITATIVE, PREGNANCY: hCG, Beta Chain, Quant, S: 51 m[IU]/mL — ABNORMAL HIGH (ref <5)

## 2016-02-18 LAB — POCT PREGNANCY, URINE: Preg Test, Ur: NEGATIVE

## 2016-02-18 NOTE — MAU Note (Signed)
Had some +HPT 2 days ago.  Has been real bad stomach cramp, lower stomach. No bleeding. Face broke out, worse than it has ever been.

## 2016-02-18 NOTE — MAU Provider Note (Signed)
Chief Complaint: Abdominal Pain; Possible Pregnancy; and Acne   First Provider Initiated Contact with Patient 02/18/16 1938        SUBJECTIVE HPI: Tara Sanchez is a [age] y.o. [id number] at Unknown by LMP who presents to maternity admissions reporting lower abdominal cramping that comes and goes.  No bleeding. + acne so she suspected pregnancy.  Had 4 tests Saturday which were faint positives.  She denies vaginal bleeding, vaginal itching/burning, urinary symptoms, h/a, dizziness, n/v, or fever/chills.     Abdominal Pain  This is a recurrent problem. The current episode started in the past 7 days. The onset quality is gradual. The problem occurs intermittently. The problem has been waxing and waning. The pain is located in the suprapubic region. The pain is mild. The quality of the pain is cramping. The abdominal pain does not radiate. Pertinent negatives include no constipation, diarrhea, fever, nausea or vomiting. Nothing aggravates the pain. The pain is relieved by nothing. She has tried nothing for the symptoms.   RN Note: Had some +HPT 2 days ago.  Has been real bad stomach cramp, lower stomach. No bleeding. Face broke out, worse than it has ever been  Past Medical History:  Diagnosis Date  . Genital herpes   . Headache   . Infection    UTI   Past Surgical History:  Procedure Laterality Date  . THERAPEUTIC ABORTION     Social History   Social History  . Marital status: Single    Spouse name: N/A  . Number of children: N/A  . Years of education: N/A   Occupational History  . Not on file.   Social History Main Topics  . Smoking status: Former Smoker    Years: 2.00    Types: Cigars  . Smokeless tobacco: Never Used  . Alcohol use Yes     Comment: Social  . Drug use: No  . Sexual activity: Yes    Birth control/ protection: Coitus interruptus   Other Topics Concern  . Not on file   Social History Narrative  . No narrative on file   No current facility-administered  medications on file prior to encounter.    No current outpatient prescriptions on file prior to encounter.   Allergies  Allergen Reactions  . Macrobid [Nitrofurantoin] Anaphylaxis  . Phenergan [Promethazine Hcl] Other (See Comments)    Reaction:  Severe sedation  . Tessalon [Benzonatate] Other (See Comments)    Reaction:  Severe sedation    I have reviewed patient's Past Medical Hx, Surgical Hx, Family Hx, Social Hx, medications and allergies.   ROS:  Review of Systems  Constitutional: Negative for fever.  Gastrointestinal: Positive for abdominal pain. Negative for constipation, diarrhea, nausea and vomiting.   Review of Systems  Other systems negative   Physical Exam  Patient Vitals for the past 24 hrs:  BP Temp Temp src Pulse Resp Height Weight  02/18/16 1857 (!) 116/43 98.1 F (36.7 C) Oral 68 16 5\' 7"  (1.702 m) 157 lb 3.2 oz (71.3 kg)   Physical Exam  Constitutional: Well-developed, well-nourished female in no acute distress.  Cardiovascular: normal rate Respiratory: normal effort GI: Abd soft, non-tender. Pos BS x 4 MS: Extremities nontender, no edema, normal ROM Neurologic: Alert and oriented x 4.  GU: Neg CVAT.  PELVIC EXAM: Cervix 0/long/high, firm, anterior, neg CMT, uterus nontender, nonenlarged, adnexa without tenderness, enlargement, or mass   LAB RESULTS Results for orders placed or performed during the hospital encounter of 02/18/16 (from the  past 24 hour(s))  Urinalysis, Routine w reflex microscopic (not at [facility])     Status: None   Collection Time: 02/18/16  7:00 PM  Result Value Ref Range   Color, Urine YELLOW YELLOW   APPearance CLEAR CLEAR   Specific Gravity, Urine 1.025 1.005 - 1.030   pH 6.0 5.0 - 8.0   Glucose, UA NEGATIVE NEGATIVE mg/dL   Hgb urine dipstick NEGATIVE NEGATIVE   Bilirubin Urine NEGATIVE NEGATIVE   Ketones, ur NEGATIVE NEGATIVE mg/dL   Protein, ur NEGATIVE NEGATIVE mg/dL   Nitrite NEGATIVE NEGATIVE   Leukocytes, UA NEGATIVE  NEGATIVE  Pregnancy, urine POC     Status: None   Collection Time: 02/18/16  7:12 PM  Result Value Ref Range   Preg Test, Ur NEGATIVE NEGATIVE  CBC     Status: Abnormal   Collection Time: 02/18/16  7:48 PM  Result Value Ref Range   WBC 12.0 (H) 4.0 - 10.5 K/uL   RBC 4.23 3.87 - 5.11 MIL/uL   Hemoglobin 12.6 12.0 - 15.0 g/dL   HCT 16.137.9 09.636.0 - 04.546.0 %   MCV 89.6 78.0 - 100.0 fL   MCH 29.8 26.0 - 34.0 pg   MCHC 33.2 30.0 - 36.0 g/dL   RDW 40.913.3 81.111.5 - 91.415.5 %   Platelets 378 150 - 400 K/uL  hCG, quantitative, pregnancy     Status: Abnormal   Collection Time: 02/18/16  7:48 PM  Result Value Ref Range   hCG, Beta Chain, Quant, S 51 (H) <5 mIU/mL    --/--/B POS (01/05 0015)  IMAGING No results found.  MAU Management/MDM: Ordered usual first trimester r/o ectopic labs.   Pelvic exam and cultures done at last visit  Consult Dr Jolayne Pantheronstant with presentation, exam findings, and results.    This bleeding/pain can represent a normal pregnancy with bleeding, spontaneous abortion or even an ectopic which can be life-threatening.  The process as listed above helps to determine which of these is present.    ASSESSMENT Pregnancy at unknown gestation, probably around 4-6 weeks by unsure LMP Pelvic pain Pregnancy of unknown location  PLAN Discharge home Plan to repeat HCG level in 48 hours in clinic per 11:00 am schedule Will repeat  Ultrasound in about 7-10 days if HCG levels double appropriately  Strict ectopic precautions Discussed with Dr Jolayne Pantheronstant. Since Quant was <1 on last visit and 51 today, will not get US at this time. If quants rise appropriately, get US in 7-10 days.  Pt stable at time of discharge. Encouraged to return here or to other Urgent Care/ED if she develops worsening of symptoms, increase in pain, fever, or other concerning symptoms.    Wynelle BourgeoisMarie Iva Posten CNM, MSN Certified Nurse-Midwife 02/18/2016  7:38 PM

## 2016-02-18 NOTE — Discharge Instructions (Signed)
First Trimester of Pregnancy The first trimester of pregnancy is from week 1 until the end of week 12 (months 1 through 3). A week after a sperm fertilizes an egg, the egg will implant on the wall of the uterus. This embryo will begin to develop into a baby. Genes from you and your partner are forming the baby. The female genes determine whether the baby is a boy or a girl. At 6-8 weeks, the eyes and face are formed, and the heartbeat can be seen on ultrasound. At the end of 12 weeks, all the baby's organs are formed.  Now that you are pregnant, you will want to do everything you can to have a healthy baby. Two of the most important things are to get good prenatal care and to follow your health care provider's instructions. Prenatal care is all the medical care you receive before the baby's birth. This care will help prevent, find, and treat any problems during the pregnancy and childbirth. BODY CHANGES Your body goes through many changes during pregnancy. The changes vary from woman to woman.   You may gain or lose a couple of pounds at first.  You may feel sick to your stomach (nauseous) and throw up (vomit). If the vomiting is uncontrollable, call your health care provider.  You may tire easily.  You may develop headaches that can be relieved by medicines approved by your health care provider.  You may urinate more often. Painful urination may mean you have a bladder infection.  You may develop heartburn as a result of your pregnancy.  You may develop constipation because certain hormones are causing the muscles that push waste through your intestines to slow down.  You may develop hemorrhoids or swollen, bulging veins (varicose veins).  Your breasts may begin to grow larger and become tender. Your nipples may stick out more, and the tissue that surrounds them (areola) may become darker.  Your gums may bleed and may be sensitive to brushing and flossing.  Dark spots or blotches (chloasma,  mask of pregnancy) may develop on your face. This will likely fade after the baby is born.  Your menstrual periods will stop.  You may have a loss of appetite.  You may develop cravings for certain kinds of food.  You may have changes in your emotions from day to day, such as being excited to be pregnant or being concerned that something may go wrong with the pregnancy and baby.  You may have more vivid and strange dreams.  You may have changes in your hair. These can include thickening of your hair, rapid growth, and changes in texture. Some women also have hair loss during or after pregnancy, or hair that feels dry or thin. Your hair will most likely return to normal after your baby is born. WHAT TO EXPECT AT YOUR PRENATAL VISITS During a routine prenatal visit:  You will be weighed to make sure you and the baby are growing normally.  Your blood pressure will be taken.  Your abdomen will be measured to track your baby's growth.  The fetal heartbeat will be listened to starting around week 10 or 12 of your pregnancy.  Test results from any previous visits will be discussed. Your health care provider may ask you:  How you are feeling.  If you are feeling the baby move.  If you have had any abnormal symptoms, such as leaking fluid, bleeding, severe headaches, or abdominal cramping.  If you are using any tobacco products,   including cigarettes, chewing tobacco, and electronic cigarettes.  If you have any questions. Other tests that may be performed during your first trimester include:  Blood tests to find your blood type and to check for the presence of any previous infections. They will also be used to check for low iron levels (anemia) and Rh antibodies. Later in the pregnancy, blood tests for diabetes will be done along with other tests if problems develop.  Urine tests to check for infections, diabetes, or protein in the urine.  An ultrasound to confirm the proper growth  and development of the baby.  An amniocentesis to check for possible genetic problems.  Fetal screens for spina bifida and Down syndrome.  You may need other tests to make sure you and the baby are doing well.  HIV (human immunodeficiency virus) testing. Routine prenatal testing includes screening for HIV, unless you choose not to have this test. HOME CARE INSTRUCTIONS  Medicines  Follow your health care provider's instructions regarding medicine use. Specific medicines may be either safe or unsafe to take during pregnancy.  Take your prenatal vitamins as directed.  If you develop constipation, try taking a stool softener if your health care provider approves. Diet  Eat regular, well-balanced meals. Choose a variety of foods, such as meat or vegetable-based protein, fish, milk and low-fat dairy products, vegetables, fruits, and whole grain breads and cereals. Your health care provider will help you determine the amount of weight gain that is right for you.  Avoid raw meat and uncooked cheese. These carry germs that can cause birth defects in the baby.  Eating four or five small meals rather than three large meals a day may help relieve nausea and vomiting. If you start to feel nauseous, eating a few soda crackers can be helpful. Drinking liquids between meals instead of during meals also seems to help nausea and vomiting.  If you develop constipation, eat more high-fiber foods, such as fresh vegetables or fruit and whole grains. Drink enough fluids to keep your urine clear or pale yellow. Activity and Exercise  Exercise only as directed by your health care provider. Exercising will help you:  Control your weight.  Stay in shape.  Be prepared for labor and delivery.  Experiencing pain or cramping in the lower abdomen or low back is a good sign that you should stop exercising. Check with your health care provider before continuing normal exercises.  Try to avoid standing for long  periods of time. Move your legs often if you must stand in one place for a long time.  Avoid heavy lifting.  Wear low-heeled shoes, and practice good posture.  You may continue to have sex unless your health care provider directs you otherwise. Relief of Pain or Discomfort  Wear a good support bra for breast tenderness.   Take warm sitz baths to soothe any pain or discomfort caused by hemorrhoids. Use hemorrhoid cream if your health care provider approves.   Rest with your legs elevated if you have leg cramps or low back pain.  If you develop varicose veins in your legs, wear support hose. Elevate your feet for 15 minutes, 3-4 times a day. Limit salt in your diet. Prenatal Care  Schedule your prenatal visits by the twelfth week of pregnancy. They are usually scheduled monthly at first, then more often in the last 2 months before delivery.  Write down your questions. Take them to your prenatal visits.  Keep all your prenatal visits as directed by your   health care provider. Safety  Wear your seat belt at all times when driving.  Make a list of emergency phone numbers, including numbers for family, friends, the hospital, and police and fire departments. General Tips  Ask your health care provider for a referral to a local prenatal education class. Begin classes no later than at the beginning of month 6 of your pregnancy.  Ask for help if you have counseling or nutritional needs during pregnancy. Your health care provider can offer advice or refer you to specialists for help with various needs.  Do not use hot tubs, steam rooms, or saunas.  Do not douche or use tampons or scented sanitary pads.  Do not cross your legs for long periods of time.  Avoid cat litter boxes and soil used by cats. These carry germs that can cause birth defects in the baby and possibly loss of the fetus by miscarriage or stillbirth.  Avoid all smoking, herbs, alcohol, and medicines not prescribed by  your health care provider. Chemicals in these affect the formation and growth of the baby.  Do not use any tobacco products, including cigarettes, chewing tobacco, and electronic cigarettes. If you need help quitting, ask your health care provider. You may receive counseling support and other resources to help you quit.  Schedule a dentist appointment. At home, brush your teeth with a soft toothbrush and be gentle when you floss. SEEK MEDICAL CARE IF:   You have dizziness.  You have mild pelvic cramps, pelvic pressure, or nagging pain in the abdominal area.  You have persistent nausea, vomiting, or diarrhea.  You have a bad smelling vaginal discharge.  You have pain with urination.  You notice increased swelling in your face, hands, legs, or ankles. SEEK IMMEDIATE MEDICAL CARE IF:   You have a fever.  You are leaking fluid from your vagina.  You have spotting or bleeding from your vagina.  You have severe abdominal cramping or pain.  You have rapid weight gain or loss.  You vomit blood or material that looks like coffee grounds.  You are exposed to German measles and have never had them.  You are exposed to fifth disease or chickenpox.  You develop a severe headache.  You have shortness of breath.  You have any kind of trauma, such as from a fall or a car accident.   This information is not intended to replace advice given to you by your health care provider. Make sure you discuss any questions you have with your health care provider.   Document Released: 04/01/2001 Document Revised: 04/28/2014 Document Reviewed: 02/15/2013 Elsevier Interactive Patient Education 2016 [employer]  

## 2016-02-20 ENCOUNTER — Telehealth: Payer: Self-pay

## 2016-02-20 ENCOUNTER — Ambulatory Visit: Payer: Medicaid Other

## 2016-02-20 NOTE — Telephone Encounter (Signed)
Attempted to reach patient at number listed in her chart. No-answer or voicemail to leave a message. Patient miss her appointment for a repeat STAT beta.

## 2016-03-01 ENCOUNTER — Inpatient Hospital Stay (HOSPITAL_COMMUNITY)
Admission: AD | Admit: 2016-03-01 | Discharge: 2016-03-01 | Disposition: A | Discharge status: Home / Self Care | Payer: Medicaid Other | Source: Ambulatory Visit | Attending: Obstetrics and Gynecology | Admitting: Obstetrics and Gynecology

## 2016-03-01 ENCOUNTER — Inpatient Hospital Stay (HOSPITAL_COMMUNITY): Payer: Medicaid Other

## 2016-03-01 ENCOUNTER — Encounter (HOSPITAL_COMMUNITY): Payer: Self-pay | Admitting: Certified Nurse Midwife

## 2016-03-01 DIAGNOSIS — O26899 Other specified pregnancy related conditions, unspecified trimester: Secondary | ICD-10-CM

## 2016-03-01 DIAGNOSIS — O26891 Other specified pregnancy related conditions, first trimester: Secondary | ICD-10-CM | POA: Insufficient documentation

## 2016-03-01 DIAGNOSIS — Z3A01 Less than 8 weeks gestation of pregnancy: Secondary | ICD-10-CM | POA: Insufficient documentation

## 2016-03-01 DIAGNOSIS — R102 Other specified pregnancy related conditions, unspecified trimester: Secondary | ICD-10-CM

## 2016-03-01 LAB — URINALYSIS, ROUTINE W REFLEX MICROSCOPIC
Bilirubin Urine: NEGATIVE
Glucose, UA: NEGATIVE mg/dL
Hgb urine dipstick: NEGATIVE
KETONES UR: NEGATIVE mg/dL
LEUKOCYTES UA: NEGATIVE
NITRITE: NEGATIVE
PH: 7 (ref 5.0–8.0)
PROTEIN: NEGATIVE mg/dL
Specific Gravity, Urine: 1.02 (ref 1.005–1.030)

## 2016-03-01 LAB — HCG, QUANTITATIVE, PREGNANCY: HCG, BETA CHAIN, QUANT, S: 6122 m[IU]/mL — AB (ref <5)

## 2016-03-01 NOTE — MAU Note (Signed)
Patient presents to MAU with lower vagina cramping that gets worse at night. Was suppose to come back to get a hormone level redrawn a week ago. Patient complains of abdominal pain and nausea along with cramping for two weeks.

## 2016-03-01 NOTE — Discharge Instructions (Signed)
First Trimester of Pregnancy The first trimester of pregnancy is from week 1 until the end of week 12 (months 1 through 3). A week after a sperm fertilizes an egg, the egg will implant on the wall of the uterus. This embryo will begin to develop into a baby. Genes from you and your partner are forming the baby. The female genes determine whether the baby is a boy or a girl. At 6-8 weeks, the eyes and face are formed, and the heartbeat can be seen on ultrasound. At the end of 12 weeks, all the baby's organs are formed.  Now that you are pregnant, you will want to do everything you can to have a healthy baby. Two of the most important things are to get good prenatal care and to follow your health care provider's instructions. Prenatal care is all the medical care you receive before the baby's birth. This care will help prevent, find, and treat any problems during the pregnancy and childbirth. BODY CHANGES Your body goes through many changes during pregnancy. The changes vary from woman to woman.   You may gain or lose a couple of pounds at first.  You may feel sick to your stomach (nauseous) and throw up (vomit). If the vomiting is uncontrollable, call your health care provider.  You may tire easily.  You may develop headaches that can be relieved by medicines approved by your health care provider.  You may urinate more often. Painful urination may mean you have a bladder infection.  You may develop heartburn as a result of your pregnancy.  You may develop constipation because certain hormones are causing the muscles that push waste through your intestines to slow down.  You may develop hemorrhoids or swollen, bulging veins (varicose veins).  Your breasts may begin to grow larger and become tender. Your nipples may stick out more, and the tissue that surrounds them (areola) may become darker.  Your gums may bleed and may be sensitive to brushing and flossing.  Dark spots or blotches (chloasma,  mask of pregnancy) may develop on your face. This will likely fade after the baby is born.  Your menstrual periods will stop.  You may have a loss of appetite.  You may develop cravings for certain kinds of food.  You may have changes in your emotions from day to day, such as being excited to be pregnant or being concerned that something may go wrong with the pregnancy and baby.  You may have more vivid and strange dreams.  You may have changes in your hair. These can include thickening of your hair, rapid growth, and changes in texture. Some women also have hair loss during or after pregnancy, or hair that feels dry or thin. Your hair will most likely return to normal after your baby is born. WHAT TO EXPECT AT YOUR PRENATAL VISITS During a routine prenatal visit:  You will be weighed to make sure you and the baby are growing normally.  Your blood pressure will be taken.  Your abdomen will be measured to track your baby's growth.  The fetal heartbeat will be listened to starting around week 10 or 12 of your pregnancy.  Test results from any previous visits will be discussed. Your health care provider may ask you:  How you are feeling.  If you are feeling the baby move.  If you have had any abnormal symptoms, such as leaking fluid, bleeding, severe headaches, or abdominal cramping.  If you are using any tobacco products,   including cigarettes, chewing tobacco, and electronic cigarettes.  If you have any questions. Other tests that may be performed during your first trimester include:  Blood tests to find your blood type and to check for the presence of any previous infections. They will also be used to check for low iron levels (anemia) and Rh antibodies. Later in the pregnancy, blood tests for diabetes will be done along with other tests if problems develop.  Urine tests to check for infections, diabetes, or protein in the urine.  An ultrasound to confirm the proper growth  and development of the baby.  An amniocentesis to check for possible genetic problems.  Fetal screens for spina bifida and Down syndrome.  You may need other tests to make sure you and the baby are doing well.  HIV (human immunodeficiency virus) testing. Routine prenatal testing includes screening for HIV, unless you choose not to have this test. HOME CARE INSTRUCTIONS  Medicines  Follow your health care provider's instructions regarding medicine use. Specific medicines may be either safe or unsafe to take during pregnancy.  Take your prenatal vitamins as directed.  If you develop constipation, try taking a stool softener if your health care provider approves. Diet  Eat regular, well-balanced meals. Choose a variety of foods, such as meat or vegetable-based protein, fish, milk and low-fat dairy products, vegetables, fruits, and whole grain breads and cereals. Your health care provider will help you determine the amount of weight gain that is right for you.  Avoid raw meat and uncooked cheese. These carry germs that can cause birth defects in the baby.  Eating four or five small meals rather than three large meals a day may help relieve nausea and vomiting. If you start to feel nauseous, eating a few soda crackers can be helpful. Drinking liquids between meals instead of during meals also seems to help nausea and vomiting.  If you develop constipation, eat more high-fiber foods, such as fresh vegetables or fruit and whole grains. Drink enough fluids to keep your urine clear or pale yellow. Activity and Exercise  Exercise only as directed by your health care provider. Exercising will help you:  Control your weight.  Stay in shape.  Be prepared for labor and delivery.  Experiencing pain or cramping in the lower abdomen or low back is a good sign that you should stop exercising. Check with your health care provider before continuing normal exercises.  Try to avoid standing for long  periods of time. Move your legs often if you must stand in one place for a long time.  Avoid heavy lifting.  Wear low-heeled shoes, and practice good posture.  You may continue to have sex unless your health care provider directs you otherwise. Relief of Pain or Discomfort  Wear a good support bra for breast tenderness.   Take warm sitz baths to soothe any pain or discomfort caused by hemorrhoids. Use hemorrhoid cream if your health care provider approves.   Rest with your legs elevated if you have leg cramps or low back pain.  If you develop varicose veins in your legs, wear support hose. Elevate your feet for 15 minutes, 3-4 times a day. Limit salt in your diet. Prenatal Care  Schedule your prenatal visits by the twelfth week of pregnancy. They are usually scheduled monthly at first, then more often in the last 2 months before delivery.  Write down your questions. Take them to your prenatal visits.  Keep all your prenatal visits as directed by your   health care provider. Safety  Wear your seat belt at all times when driving.  Make a list of emergency phone numbers, including numbers for family, friends, the hospital, and police and fire departments. General Tips  Ask your health care provider for a referral to a local prenatal education class. Begin classes no later than at the beginning of month 6 of your pregnancy.  Ask for help if you have counseling or nutritional needs during pregnancy. Your health care provider can offer advice or refer you to specialists for help with various needs.  Do not use hot tubs, steam rooms, or saunas.  Do not douche or use tampons or scented sanitary pads.  Do not cross your legs for long periods of time.  Avoid cat litter boxes and soil used by cats. These carry germs that can cause birth defects in the baby and possibly loss of the fetus by miscarriage or stillbirth.  Avoid all smoking, herbs, alcohol, and medicines not prescribed by  your health care provider. Chemicals in these affect the formation and growth of the baby.  Do not use any tobacco products, including cigarettes, chewing tobacco, and electronic cigarettes. If you need help quitting, ask your health care provider. You may receive counseling support and other resources to help you quit.  Schedule a dentist appointment. At home, brush your teeth with a soft toothbrush and be gentle when you floss. SEEK MEDICAL CARE IF:   You have dizziness.  You have mild pelvic cramps, pelvic pressure, or nagging pain in the abdominal area.  You have persistent nausea, vomiting, or diarrhea.  You have a bad smelling vaginal discharge.  You have pain with urination.  You notice increased swelling in your face, hands, legs, or ankles. SEEK IMMEDIATE MEDICAL CARE IF:   You have a fever.  You are leaking fluid from your vagina.  You have spotting or bleeding from your vagina.  You have severe abdominal cramping or pain.  You have rapid weight gain or loss.  You vomit blood or material that looks like coffee grounds.  You are exposed to German measles and have never had them.  You are exposed to fifth disease or chickenpox.  You develop a severe headache.  You have shortness of breath.  You have any kind of trauma, such as from a fall or a car accident.   This information is not intended to replace advice given to you by your health care provider. Make sure you discuss any questions you have with your health care provider.   Document Released: 04/01/2001 Document Revised: 04/28/2014 Document Reviewed: 02/15/2013 Elsevier Interactive Patient Education 2016 [employer]  

## 2016-03-01 NOTE — MAU Note (Signed)
Urine in lab 

## 2016-03-01 NOTE — MAU Provider Note (Signed)
Chief Complaint: No chief complaint on file.   First Provider Initiated Contact with Patient 03/01/16 1929        SUBJECTIVE HPI: Tara Sanchez is a 25 y.o. [id number] at 6129w5d by LMP who presents to maternity admissions reporting cramping in lower pelvis. No bleeding. Was seen by me 10 days ago and HCG was 51.  Never came back for repeat HCG level.. She denies vaginal bleeding, vaginal itching/burning, urinary symptoms, h/a, dizziness, n/v, or fever/chills.    Abdominal Pain  This is a recurrent problem. The current episode started 1 to 4 weeks ago. The onset quality is gradual. The problem occurs intermittently. The problem has been unchanged. The pain is located in the LLQ, RLQ and suprapubic region. The pain is mild. The quality of the pain is aching and cramping. The abdominal pain does not radiate. Pertinent negatives include no constipation, diarrhea, fever, nausea or vomiting. Nothing aggravates the pain. The pain is relieved by nothing. She has tried nothing for the symptoms.   RN Note: Patient presents to MAU with lower vagina cramping that gets worse at night. Was suppose to come back to get a hormone level redrawn a week ago. Patient complains of abdominal pain and nausea along with cramping for two weeks.   Past Medical History:  Diagnosis Date  . Genital herpes   . Headache   . Infection    UTI   Past Surgical History:  Procedure Laterality Date  . THERAPEUTIC ABORTION     Social History   Social History  . Marital status: Single    Spouse name: N/A  . Number of children: N/A  . Years of education: N/A   Occupational History  . Not on file.   Social History Main Topics  . Smoking status: Former Smoker    Years: 2.00    Types: Cigars  . Smokeless tobacco: Never Used  . Alcohol use Yes     Comment: Social  . Drug use: No  . Sexual activity: Yes    Birth control/ protection: Coitus interruptus   Other Topics Concern  . Not on file   Social History Narrative   . No narrative on file   No current facility-administered medications on file prior to encounter.    No current outpatient prescriptions on file prior to encounter.   Allergies  Allergen Reactions  . Macrobid [Nitrofurantoin] Anaphylaxis  . Phenergan [Promethazine Hcl] Other (See Comments)    Reaction:  Severe sedation  . Tessalon [Benzonatate] Other (See Comments)    Reaction:  Severe sedation    I have reviewed patient's Past Medical Hx, Surgical Hx, Family Hx, Social Hx, medications and allergies.   ROS:  Review of Systems  Constitutional: Negative for chills and fever.  Gastrointestinal: Positive for abdominal pain. Negative for constipation, diarrhea, nausea and vomiting.  Genitourinary: Positive for pelvic pain. Negative for vaginal bleeding.  Musculoskeletal: Negative for back pain.   Review of Systems  Other systems negative   Physical Exam  Patient Vitals for the past 24 hrs:  BP Temp Temp src Pulse Resp SpO2 Height Weight  03/01/16 1926 110/60 98.7 F (37.1 C) Oral 77 16 99 % 5\' 8"  (1.727 m) 160 lb 9.6 oz (72.8 kg)   Physical Exam  Constitutional: Well-developed, well-nourished female in no acute distress.  Cardiovascular: normal rate Respiratory: normal effort GI: Abd soft, non-tender. Pos BS x 4 MS: Extremities nontender, no edema, normal ROM Neurologic: Alert and oriented x 4.  GU: Neg CVAT.  PELVIC EXAM: Cervix pink, visually closed, without lesion, scant white creamy discharge, vaginal walls and external genitalia normal Bimanual exam: Cervix 0/long/high, firm, anterior, neg CMT, uterus slighlty tender, nonenlarged, adnexa without tenderness, enlargement, or mass    LAB RESULTS Results for orders placed or performed during the hospital encounter of 03/01/16 (from the past 24 hour(s))  hCG, quantitative, pregnancy     Status: Abnormal   Collection Time: 03/01/16  7:33 PM  Result Value Ref Range   hCG, Beta Chain, Quant, S 6,122 (H) <5 mIU/mL     Ref. Range 02/18/2016 19:48  HCG, Beta Chain, Quant, S Latest Ref Range: <5 mIU/mL 51 (H)    --/--/B POS (01/05 0015)  IMAGING No results found.  MAU Management/MDM: Ordered usual first trimester r/o ectopic labs.   Pelvic exam and cultures done last visit Will check baseline Ultrasound to rule out ectopic.   This bleeding/pain can represent a normal pregnancy with bleeding, spontaneous abortion or even an ectopic which can be life-threatening.  The process as listed above helps to determine which of these is present.  US showed single gestational sac with yolk sac, which effectively rules out ectopic pregnancy  ASSESSMENT Single intrauterine pregnancy at 1465w5d by LMP, 146w4d by gestational sac size Pelvic pain of uncertain origin  PLAN Discharge home Encouraged to seek prenatal care Proof of pregnancy letter given  Pt stable at time of discharge. Encouraged to return here or to other Urgent Care/ED if she develops worsening of symptoms, increase in pain, fever, or other concerning symptoms.    Wynelle BourgeoisMarie Jago Carton CNM, MSN Certified Nurse-Midwife 03/01/2016  7:37 PM

## 2016-03-19 ENCOUNTER — Inpatient Hospital Stay (HOSPITAL_COMMUNITY)
Admission: AD | Admit: 2016-03-19 | Discharge: 2016-03-19 | Disposition: A | Discharge status: Home / Self Care | Payer: Medicaid Other | Source: Ambulatory Visit | Attending: Obstetrics & Gynecology | Admitting: Obstetrics & Gynecology

## 2016-03-19 ENCOUNTER — Encounter (HOSPITAL_COMMUNITY): Payer: Self-pay

## 2016-03-19 DIAGNOSIS — Z3A09 9 weeks gestation of pregnancy: Secondary | ICD-10-CM | POA: Diagnosis not present

## 2016-03-19 DIAGNOSIS — Z3491 Encounter for supervision of normal pregnancy, unspecified, first trimester: Secondary | ICD-10-CM | POA: Diagnosis not present

## 2016-03-19 DIAGNOSIS — A6000 Herpesviral infection of urogenital system, unspecified: Secondary | ICD-10-CM

## 2016-03-19 LAB — URINALYSIS, ROUTINE W REFLEX MICROSCOPIC
BILIRUBIN URINE: NEGATIVE
Glucose, UA: NEGATIVE mg/dL
HGB URINE DIPSTICK: NEGATIVE
Ketones, ur: NEGATIVE mg/dL
Leukocytes, UA: NEGATIVE
Nitrite: NEGATIVE
PH: 6 (ref 5.0–8.0)
Protein, ur: NEGATIVE mg/dL

## 2016-03-19 LAB — WET PREP, GENITAL
SPERM: NONE SEEN
TRICH WET PREP: NONE SEEN
Yeast Wet Prep HPF POC: NONE SEEN

## 2016-03-19 MED ORDER — VALACYCLOVIR HCL 1 G PO TABS: 1000.0000 mg | ORAL_TABLET | Freq: Every day | ORAL | 5 refills | Status: DC

## 2016-03-19 NOTE — MAU Provider Note (Signed)
Chief Complaint: Vaginal Discharge   First Provider Initiated Contact with Patient 03/19/16 2015      SUBJECTIVE HPI: Tara Sanchez is a 25 y.o. [id number] at 1071w1d by LMP who presents to maternity admissions reporting increased white vaginal discharge, vaginal irritation and some burning when she urinates. She has not tried any treatments and the symptoms started 3 days ago and are staying the same since onset.  She has had HSV outbreak before and is concerned that she has an outbreak now. She denies abdominal pain, vaginal bleeding, h/a, dizziness, n/v, or fever/chills.     HPI  Past Medical History:  Diagnosis Date  . Genital herpes   . Headache   . Infection    UTI   Past Surgical History:  Procedure Laterality Date  . THERAPEUTIC ABORTION     Social History   Social History  . Marital status: Single    Spouse name: N/A  . Number of children: N/A  . Years of education: N/A   Occupational History  . Not on file.   Social History Main Topics  . Smoking status: Former Smoker    Years: 2.00    Types: Cigars  . Smokeless tobacco: Never Used  . Alcohol use Yes     Comment: Social  . Drug use: No  . Sexual activity: Yes    Birth control/ protection: Coitus interruptus   Other Topics Concern  . Not on file   Social History Narrative  . No narrative on file   No current facility-administered medications on file prior to encounter.    Current Outpatient Prescriptions on File Prior to Encounter  Medication Sig Dispense Refill  . Prenatal Vit-Fe Fumarate-FA (PRENATAL MULTIVITAMIN) TABS tablet Take 1 tablet by mouth daily at 12 noon.     Allergies  Allergen Reactions  . Macrobid [Nitrofurantoin] Anaphylaxis  . Phenergan [Promethazine Hcl] Other (See Comments)    Reaction:  Severe sedation  . Tessalon [Benzonatate] Other (See Comments)    Reaction:  Severe sedation    ROS:  Review of Systems  Constitutional: Negative for chills, fatigue and fever.  Respiratory:  Negative for shortness of breath.   Cardiovascular: Negative for chest pain.  Genitourinary: Positive for vaginal discharge and vaginal pain. Negative for difficulty urinating, dysuria, flank pain, pelvic pain and vaginal bleeding.  Neurological: Negative for dizziness and headaches.  Psychiatric/Behavioral: Negative.      I have reviewed patient's Past Medical Hx, Surgical Hx, Family Hx, Social Hx, medications and allergies.   Physical Exam  Patient Vitals for the past 24 hrs:  BP Temp Temp src Pulse Resp Weight  03/19/16 1815 107/63 99.4 F (37.4 C) Oral 86 16 162 lb 3.2 oz (73.6 kg)   Constitutional: Well-developed, well-nourished female in no acute distress.  Cardiovascular: normal rate Respiratory: normal effort GI: Abd soft, non-tender. Pos BS x 4 MS: Extremities nontender, no edema, normal ROM Neurologic: Alert and oriented x 4.  GU: Neg CVAT.  PELVIC EXAM: Cervix pink, visually closed, without lesion, scant white creamy discharge, vaginal walls and external genitalia normal Bimanual exam: Cervix 0/long/high, firm, anterior, neg CMT, uterus nontender, nonenlarged, adnexa without tenderness, enlargement, or mass    LAB RESULTS  Urinalysis, Routine w reflex microscopic ([facility])   Collection Time: 03/19/16  6:43 PM  Result Value Ref Range   Color, Urine YELLOW YELLOW   APPearance CLEAR CLEAR   Specific Gravity, Urine >1.030 (H) 1.005 - 1.030   pH 6.0 5.0 - 8.0  Glucose, UA NEGATIVE NEGATIVE mg/dL   Hgb urine dipstick NEGATIVE NEGATIVE   Bilirubin Urine NEGATIVE NEGATIVE   Ketones, ur NEGATIVE NEGATIVE mg/dL   Protein, ur NEGATIVE NEGATIVE mg/dL   Nitrite NEGATIVE NEGATIVE   Leukocytes, UA NEGATIVE NEGATIVE  Wet prep, genital   Collection Time: 03/19/16  8:40 PM  Result Value Ref Range   Yeast Wet Prep HPF POC NONE SEEN NONE SEEN   Trich, Wet Prep NONE SEEN NONE SEEN   Clue Cells Wet Prep HPF POC PRESENT (A) NONE SEEN   WBC, Wet Prep HPF POC MANY (A) NONE  SEEN   Sperm NONE SEEN      --/--/B POS (01/05 0015)  IMAGING  Bedside US today with FHR 126, fluid subjectively normal, CRL c/w 6682w5d.   MAU Management/MDM: Ordered labs and reviewed results.  IUP confirmed on 11/11 with previous US.  Bedside US today normal with appropriate growth since last US.  No evidence of HSV, possible folliculitis noted.  Warm compresses daily.  Rx for Valtrex with refills for recurrent HSV.  GCC pending.  Pt stable at time of discharge.  ASSESSMENT  1. Recurrent genital herpes    PLAN Discharge home    Allergies as of 03/19/2016      Reactions   Macrobid [nitrofurantoin] Anaphylaxis   Phenergan [promethazine Hcl] Other (See Comments)   Reaction:  Severe sedation   Tessalon [benzonatate] Other (See Comments)   Reaction:  Severe sedation      Medication List    TAKE these medications   valACYclovir 1000 MG tablet Commonly known as:  VALTREX Take 1 tablet (1,000 mg total) by mouth daily.     ASK your doctor about these medications   prenatal multivitamin Tabs tablet Take 1 tablet by mouth daily at 12 noon.       Sharen CounterLisa Leftwich-Kirby Certified Nurse-Midwife 03/19/2016  8:56 PM

## 2016-03-19 NOTE — Discharge Instructions (Signed)
Try a probiotic tablet daily and boric acid suppositories to prevent bacterial vaginosis.     Bacterial Vaginosis Bacterial vaginosis is a vaginal infection that occurs when the normal balance of bacteria in the vagina is disrupted. It results from an overgrowth of certain bacteria. This is the most common vaginal infection among women ages 4315-44. Because bacterial vaginosis increases your risk for STIs (sexually transmitted infections), getting treated can help reduce your risk for chlamydia, gonorrhea, herpes, and HIV (human immunodeficiency virus). Treatment is also important for preventing complications in pregnant women, because this condition can cause an early (premature) delivery. What are the causes? This condition is caused by an increase in harmful bacteria that are normally present in small amounts in the vagina. However, the reason that the condition develops is not fully understood. What increases the risk? The following factors may make you more likely to develop this condition:  Having a new sexual partner or multiple sexual partners.  Having unprotected sex.  Douching.  Having an intrauterine device (IUD).  Smoking.  Drug and alcohol abuse.  Taking certain antibiotic medicines.  Being pregnant. You cannot get bacterial vaginosis from toilet seats, bedding, swimming pools, or contact with objects around you. What are the signs or symptoms? Symptoms of this condition include:  Grey or white vaginal discharge. The discharge can also be watery or foamy.  A fish-like odor with discharge, especially after sexual intercourse or during menstruation.  Itching in and around the vagina.  Burning or pain with urination. Some women with bacterial vaginosis have no signs or symptoms. How is this diagnosed? This condition is diagnosed based on:  Your medical history.  A physical exam of the vagina.  Testing a sample of vaginal fluid under a microscope to look for a  large amount of bad bacteria or abnormal cells. Your health care provider may use a cotton swab or a small wooden spatula to collect the sample. How is this treated? This condition is treated with antibiotics. These may be given as a pill, a vaginal cream, or a medicine that is put into the vagina (suppository). If the condition comes back after treatment, a second round of antibiotics may be needed. Follow these instructions at home: Medicines  Take over-the-counter and prescription medicines only as told by your health care provider.  Take or use your antibiotic as told by your health care provider. Do not stop taking or using the antibiotic even if you start to feel better. General instructions  If you have a female sexual partner, tell her that you have a vaginal infection. She should see her health care provider and be treated if she has symptoms. If you have a female sexual partner, he does not need treatment.  During treatment:  Avoid sexual activity until you finish treatment.  Do not douche.  Avoid alcohol as directed by your health care provider.  Avoid breastfeeding as directed by your health care provider.  Drink enough water and fluids to keep your urine clear or pale yellow.  Keep the area around your vagina and rectum clean.  Wash the area daily with warm water.  Wipe yourself from front to back after using the toilet.  Keep all follow-up visits as told by your health care provider. This is important. How is this prevented?  Do not douche.  Wash the outside of your vagina with warm water only.  Use protection when having sex. This includes latex condoms and dental dams.  Limit how many sexual partners you  have. To help prevent bacterial vaginosis, it is best to have sex with just one partner (monogamous).  Make sure you and your sexual partner are tested for STIs.  Wear cotton or cotton-lined underwear.  Avoid wearing tight pants and pantyhose, especially  during summer.  Limit the amount of alcohol that you drink.  Do not use any products that contain nicotine or tobacco, such as cigarettes and e-cigarettes. If you need help quitting, ask your health care provider.  Do not use illegal drugs. Where to find more information:  Centers for Disease Control and Prevention: SolutionApps.co.zawww.cdc.gov/std  American Sexual Health Association (ASHA): www.ashastd.org  U.S. Department of Health and Health and safety inspectorHuman Services, Office on Women's Health: ConventionalMedicines.siwww.womenshealth.gov/ or http://www.anderson-williamson.info/https://www.womenshealth.gov/a-z-topics/bacterial-vaginosis Contact a health care provider if:  Your symptoms do not improve, even after treatment.  You have more discharge or pain when urinating.  You have a fever.  You have pain in your abdomen.  You have pain during sex.  You have vaginal bleeding between periods. Summary  Bacterial vaginosis is a vaginal infection that occurs when the normal balance of bacteria in the vagina is disrupted.  Because bacterial vaginosis increases your risk for STIs (sexually transmitted infections), getting treated can help reduce your risk for chlamydia, gonorrhea, herpes, and HIV (human immunodeficiency virus). Treatment is also important for preventing complications in pregnant women, because the condition can cause an early (premature) delivery.  This condition is treated with antibiotic medicines. These may be given as a pill, a vaginal cream, or a medicine that is put into the vagina (suppository). This information is not intended to replace advice given to you by your health care provider. Make sure you discuss any questions you have with your health care provider. Document Released: 04/07/2005 Document Revised: 12/22/2015 Document Reviewed: 12/22/2015 Elsevier Interactive Patient Education  2017 [employer]

## 2016-03-19 NOTE — MAU Note (Signed)
having some d/c. It is irritating her. Denies burning,or itching.  Was uncomfortable when she pees.  Stomach has been turning.

## 2016-03-20 LAB — GC/CHLAMYDIA PROBE AMP (~~LOC~~) NOT AT ARMC
Chlamydia: POSITIVE — AB
NEISSERIA GONORRHEA: NEGATIVE

## 2016-03-21 ENCOUNTER — Encounter: Payer: Self-pay | Admitting: Obstetrics & Gynecology

## 2016-03-21 ENCOUNTER — Other Ambulatory Visit: Payer: Self-pay | Admitting: Obstetrics & Gynecology

## 2016-03-21 DIAGNOSIS — A749 Chlamydial infection, unspecified: Secondary | ICD-10-CM

## 2016-03-21 DIAGNOSIS — O98811 Other maternal infectious and parasitic diseases complicating pregnancy, first trimester: Secondary | ICD-10-CM | POA: Insufficient documentation

## 2016-03-21 MED ORDER — AZITHROMYCIN 500 MG PO TABS: 1000.0000 mg | ORAL_TABLET | Freq: Once | ORAL | 1 refills | Status: AC

## 2016-03-21 NOTE — Progress Notes (Signed)
Patient has chlamydia in first trimester of pregnancy.  Recommend testing for other STIs, also needs to let partner(s) know so the partner(s) can get testing and treatment. Patient and sex partner(s) should abstain from unprotected sexual activity for seven days after everyone receives appropriate treatment.  Azithromycin was prescribed for patient.  Please call to inform patient of results and recommendations, and advise to pick up prescription.  Tereso NewcomerUgonna A Samson Ralph, MD

## 2016-03-24 ENCOUNTER — Other Ambulatory Visit: Payer: Self-pay

## 2016-03-24 MED ORDER — AZITHROMYCIN 250 MG PO TABS: 250.0000 mg | ORAL_TABLET | Freq: Once | ORAL | 0 refills | Status: AC

## 2016-03-24 NOTE — Telephone Encounter (Signed)
Patient tested positive for Chlamydia per Dr.Anyuwa need to treat with Azithromycin. Patient has been informed to make her partners aware and to not have sex for 7-10 days. Patient verbalized understanding.

## 2016-03-31 ENCOUNTER — Encounter: Payer: Medicaid Other | Admitting: Certified Nurse Midwife

## 2016-04-02 ENCOUNTER — Encounter (HOSPITAL_COMMUNITY): Payer: Self-pay

## 2016-04-02 ENCOUNTER — Inpatient Hospital Stay (HOSPITAL_COMMUNITY)
Admission: AD | Admit: 2016-04-02 | Discharge: 2016-04-03 | Disposition: A | Discharge status: Home / Self Care | Payer: Medicaid Other | Source: Ambulatory Visit | Attending: Obstetrics & Gynecology | Admitting: Obstetrics & Gynecology

## 2016-04-02 DIAGNOSIS — O98811 Other maternal infectious and parasitic diseases complicating pregnancy, first trimester: Secondary | ICD-10-CM

## 2016-04-02 DIAGNOSIS — A749 Chlamydial infection, unspecified: Secondary | ICD-10-CM

## 2016-04-02 DIAGNOSIS — A568 Sexually transmitted chlamydial infection of other sites: Secondary | ICD-10-CM | POA: Diagnosis not present

## 2016-04-02 DIAGNOSIS — Z3A10 10 weeks gestation of pregnancy: Secondary | ICD-10-CM | POA: Insufficient documentation

## 2016-04-02 DIAGNOSIS — O98311 Other infections with a predominantly sexual mode of transmission complicating pregnancy, first trimester: Secondary | ICD-10-CM | POA: Insufficient documentation

## 2016-04-02 DIAGNOSIS — Z87891 Personal history of nicotine dependence: Secondary | ICD-10-CM | POA: Diagnosis not present

## 2016-04-02 DIAGNOSIS — Z7251 High risk heterosexual behavior: Secondary | ICD-10-CM | POA: Diagnosis not present

## 2016-04-02 DIAGNOSIS — O209 Hemorrhage in early pregnancy, unspecified: Secondary | ICD-10-CM | POA: Diagnosis present

## 2016-04-02 LAB — URINALYSIS, ROUTINE W REFLEX MICROSCOPIC
BACTERIA UA: NONE SEEN
Bilirubin Urine: NEGATIVE
Glucose, UA: NEGATIVE mg/dL
HGB URINE DIPSTICK: NEGATIVE
Ketones, ur: NEGATIVE mg/dL
NITRITE: NEGATIVE
PROTEIN: 30 mg/dL — AB
Specific Gravity, Urine: 1.028 (ref 1.005–1.030)
pH: 6 (ref 5.0–8.0)

## 2016-04-02 LAB — WET PREP, GENITAL
Sperm: NONE SEEN
Trich, Wet Prep: NONE SEEN
YEAST WET PREP: NONE SEEN

## 2016-04-02 MED ORDER — AZITHROMYCIN 250 MG PO TABS
1000.0000 mg | ORAL_TABLET | Freq: Once | ORAL | Status: AC
  Administered 2016-04-03: 1000 mg via ORAL
  Filled 2016-04-02: qty 4

## 2016-04-02 NOTE — MAU Note (Signed)
Pt reports some vaginal bleeding . States it is prob from the [place] Jamahir[place]am[place]. States she was treated but her partner was not.

## 2016-04-02 NOTE — MAU Provider Note (Signed)
History     CSN: [id number]  Arrival date and time: 04/02/16 2225   First Provider Initiated Contact with Patient 04/02/16 2342      Chief Complaint  Patient presents with  . Vaginal Bleeding   Tara Sanchez is a 25 y.o. [id number] at 554w1d who presents today with vaginal bleeding. She states that she had chlamydia. She had treatment, but her partner did not. She states that she had some spotting today. She feels it is from chlamydia.    Vaginal Bleeding  The patient's primary symptoms include vaginal bleeding. This is a new problem. The current episode started today. The problem occurs intermittently. The problem has been resolved. The patient is experiencing no pain. She is pregnant. Associated symptoms include nausea. Pertinent negatives include no abdominal pain, chills, constipation, diarrhea, dysuria, fever, frequency, urgency or vomiting. The vaginal bleeding is spotting. She has not been passing clots. Nothing aggravates the symptoms. She has tried nothing for the symptoms.     Past Medical History:  Diagnosis Date  . Genital herpes   . Headache   . Infection    UTI    Past Surgical History:  Procedure Laterality Date  . THERAPEUTIC ABORTION      Family History  Problem Relation Age of Onset  . Diabetes Paternal Grandmother   . Asthma Neg Hx   . Cancer Neg Hx   . Heart disease Neg Hx   . Hypertension Neg Hx   . Stroke Neg Hx   . Alcohol abuse Neg Hx   . Arthritis Neg Hx   . Birth defects Neg Hx   . COPD Neg Hx   . Depression Neg Hx   . Drug abuse Neg Hx   . Early death Neg Hx   . Hearing loss Neg Hx   . Hyperlipidemia Neg Hx   . Kidney disease Neg Hx   . Learning disabilities Neg Hx   . Mental illness Neg Hx   . Mental retardation Neg Hx   . Miscarriages / Stillbirths Neg Hx   . Vision loss Neg Hx   . Varicose Veins Neg Hx     Social History  Substance Use Topics  . Smoking status: Former Smoker    Years: 2.00    Types: Cigars  . Smokeless  tobacco: Never Used  . Alcohol use Yes     Comment: Social    Allergies:  Allergies  Allergen Reactions  . Macrobid [Nitrofurantoin] Anaphylaxis  . Phenergan [Promethazine Hcl] Other (See Comments)    Reaction:  Severe sedation  . Tessalon [Benzonatate] Other (See Comments)    Reaction:  Severe sedation    Prescriptions Prior to Admission  Medication Sig Dispense Refill Last Dose  . Prenatal Vit-Fe Fumarate-FA (PRENATAL MULTIVITAMIN) TABS tablet Take 1 tablet by mouth daily at 12 noon.   03/19/2016 at Unknown time  . valACYclovir (VALTREX) 1000 MG tablet Take 1 tablet (1,000 mg total) by mouth daily. 5 tablet 5     Review of Systems  Constitutional: Negative for chills and fever.  Gastrointestinal: Positive for nausea. Negative for abdominal pain, constipation, diarrhea and vomiting.  Genitourinary: Positive for vaginal bleeding. Negative for dysuria, frequency and urgency.   Physical Exam   Blood pressure (!) 105/53, pulse 88, temperature 98.3 F (36.8 C), temperature source Oral, resp. rate 16, height 5\' 7"  (1.702 m), weight 162 lb (73.5 kg), last menstrual period 01/07/2016, SpO2 100 %, not currently breastfeeding.  Physical Exam  Nursing note and vitals reviewed.  Constitutional: She is oriented to person, place, and time. She appears well-developed and well-nourished. No distress.  HENT:  Head: Normocephalic.  Cardiovascular: Normal rate.   Respiratory: Effort normal.  GI: Soft. There is no tenderness. There is no rebound.  Genitourinary:  Genitourinary Comments: FHT 175 with doppler   Neurological: She is alert and oriented to person, place, and time.  Skin: Skin is warm and dry.    MAU Course  Procedures  MDM Patient given zofran here, and 1g Azithromycin PO.   Assessment and Plan   1. Chlamydia infection affecting pregnancy in first trimester   2. High risk sexual behavior    DC home Comfort measures reviewed  1st Trimester precautions  RX: phenergan  PRN #30, discussed partner treatment.  Return to MAU as needed FU with OB as planned  Follow-up Information    [facility]   Specialty:  Obstetrics and Gynecology Contact information: [address] [place] [id number] [phone]           Tawnya CrookHogan, Heather Donovan 04/02/2016, 11:43 PM

## 2016-04-03 DIAGNOSIS — A749 Chlamydial infection, unspecified: Secondary | ICD-10-CM | POA: Diagnosis not present

## 2016-04-03 DIAGNOSIS — O98311 Other infections with a predominantly sexual mode of transmission complicating pregnancy, first trimester: Secondary | ICD-10-CM

## 2016-04-03 MED ORDER — PROMETHAZINE HCL 25 MG PO TABS: 12.5000 mg | ORAL_TABLET | Freq: Four times a day (QID) | ORAL | 0 refills | Status: DC | PRN

## 2016-04-03 MED ORDER — ONDANSETRON 8 MG PO TBDP
8.0000 mg | ORAL_TABLET | Freq: Once | ORAL | Status: AC
  Administered 2016-04-03: 8 mg via ORAL
  Filled 2016-04-03: qty 1

## 2016-04-03 NOTE — Discharge Instructions (Signed)
Chlamydia, Female Chlamydia is an infection. It is spread from one person to another person during sexual contact. This infection can be in the cervix, urine tube (urethra), throat, or bottom (rectum). This infection needs treatment. HOME CARE   Take your medicines (antibiotics) as told. Finish them even if you start to feel better.  Only take medicine as told by your doctor.  Tell your sex partner(s) that you have chlamydia. They must also be treated.  Do not have sex until your doctor says it is okay.  Rest.  Eat healthy. Drink enough fluids to keep your pee (urine) clear or pale yellow.  Keep all doctor visits as told. GET HELP IF:  You have pain when you pee.  You have belly pain.  You have vaginal discharge.  You have pain during sex.  You have bleeding between periods and after sex.  You have a fever. GET HELP RIGHT AWAY IF:   You feel sick to your stomach (nauseous) or you throw up (vomit).  You sweat much more than normal (diaphoresis).  You have trouble swallowing. This information is not intended to replace advice given to you by your health care provider. Make sure you discuss any questions you have with your health care provider. Document Released: 01/15/2008 Document Revised: 07/30/2015 Document Reviewed: 12/13/2012 Elsevier Interactive Patient Education  2017 Elsevier Inc.   

## 2016-04-07 LAB — GC/CHLAMYDIA PROBE AMP (~~LOC~~) NOT AT ARMC
CHLAMYDIA, DNA PROBE: NEGATIVE
Neisseria Gonorrhea: NEGATIVE

## 2016-04-17 ENCOUNTER — Other Ambulatory Visit (HOSPITAL_COMMUNITY)
Admission: RE | Admit: 2016-04-17 | Discharge: 2016-04-17 | Disposition: A | Discharge status: Home / Self Care | Payer: Medicaid Other | Source: Ambulatory Visit | Attending: Family Medicine | Admitting: Family Medicine

## 2016-04-17 ENCOUNTER — Encounter: Payer: Self-pay | Admitting: Student

## 2016-04-17 ENCOUNTER — Encounter: Payer: Medicaid Other | Admitting: Obstetrics and Gynecology

## 2016-04-17 ENCOUNTER — Ambulatory Visit (INDEPENDENT_AMBULATORY_CARE_PROVIDER_SITE_OTHER): Payer: Medicaid Other | Admitting: Clinical

## 2016-04-17 ENCOUNTER — Ambulatory Visit (INDEPENDENT_AMBULATORY_CARE_PROVIDER_SITE_OTHER): Payer: Medicaid Other | Admitting: Family Medicine

## 2016-04-17 DIAGNOSIS — Z01419 Encounter for gynecological examination (general) (routine) without abnormal findings: Secondary | ICD-10-CM | POA: Diagnosis not present

## 2016-04-17 DIAGNOSIS — A6009 Herpesviral infection of other urogenital tract: Secondary | ICD-10-CM

## 2016-04-17 DIAGNOSIS — O09211 Supervision of pregnancy with history of pre-term labor, first trimester: Secondary | ICD-10-CM

## 2016-04-17 DIAGNOSIS — F4323 Adjustment disorder with mixed anxiety and depressed mood: Secondary | ICD-10-CM | POA: Diagnosis not present

## 2016-04-17 DIAGNOSIS — A609 Anogenital herpesviral infection, unspecified: Secondary | ICD-10-CM | POA: Insufficient documentation

## 2016-04-17 DIAGNOSIS — O0990 Supervision of high risk pregnancy, unspecified, unspecified trimester: Secondary | ICD-10-CM | POA: Insufficient documentation

## 2016-04-17 DIAGNOSIS — O98311 Other infections with a predominantly sexual mode of transmission complicating pregnancy, first trimester: Secondary | ICD-10-CM

## 2016-04-17 DIAGNOSIS — O09899 Supervision of other high risk pregnancies, unspecified trimester: Secondary | ICD-10-CM

## 2016-04-17 DIAGNOSIS — Z113 Encounter for screening for infections with a predominantly sexual mode of transmission: Secondary | ICD-10-CM | POA: Insufficient documentation

## 2016-04-17 DIAGNOSIS — O09219 Supervision of pregnancy with history of pre-term labor, unspecified trimester: Secondary | ICD-10-CM

## 2016-04-17 DIAGNOSIS — Z23 Encounter for immunization: Secondary | ICD-10-CM | POA: Diagnosis present

## 2016-04-17 HISTORY — DX: Supervision of other high risk pregnancies, unspecified trimester: O09.899

## 2016-04-17 LAB — POCT URINALYSIS DIP (DEVICE)
BILIRUBIN URINE: NEGATIVE
Glucose, UA: NEGATIVE mg/dL
Hgb urine dipstick: NEGATIVE
KETONES UR: NEGATIVE mg/dL
Nitrite: NEGATIVE
PH: 7 (ref 5.0–8.0)
Protein, ur: NEGATIVE mg/dL
Specific Gravity, Urine: 1.025 (ref 1.005–1.030)
Urobilinogen, UA: 0.2 mg/dL (ref 0.0–1.0)

## 2016-04-17 NOTE — Progress Notes (Signed)
Here for first visit. Given new patient information.  Discussed 17p injections. Undecided on whether or not she wants to receive Makena injections. Discussed with nurse and Dr. Shawnie PonsPratt. [facility] but not to send until she decides. She will call back and let us know yes or no to send .

## 2016-04-17 NOTE — Progress Notes (Signed)
Subjective:    Tara Sanchez is a [id number] 4116w2d being seen today for her first obstetrical visit.  Her obstetrical history is significant for history of preterm birth. Patient does intend to breast feed. Pregnancy history fully reviewed.  Patient reports backache and fatigue.  Vitals:   04/17/16 1341  BP: 102/61  Pulse: 74  Weight: 158 lb 12.8 oz (72 kg)    HISTORY: OB History  Gravida Para Term Preterm AB Living  5 2 0 2 2 2   SAB TAB Ectopic Multiple Live Births    2   0 2    # Outcome Date GA Lbr Len/2nd Weight Sex Delivery Anes PTL Lv  5 Current           4 Preterm 04/26/15 6249w2d 07:19 / 00:14 5 lb 6.8 oz (2.46 kg) M Vag-Spont EPI  LIV  3 Preterm 04/20/10 7258w4d   F Vag-Spont EPI  LIV  2 TAB 2010          1 TAB 2009             Past Medical History:  Diagnosis Date  . Genital herpes   . Headache   . Infection    UTI   Past Surgical History:  Procedure Laterality Date  . THERAPEUTIC ABORTION     Family History  Problem Relation Age of Onset  . Diabetes Paternal Grandmother   . Asthma Neg Hx   . Cancer Neg Hx   . Heart disease Neg Hx   . Hypertension Neg Hx   . Stroke Neg Hx   . Alcohol abuse Neg Hx   . Arthritis Neg Hx   . Birth defects Neg Hx   . COPD Neg Hx   . Depression Neg Hx   . Drug abuse Neg Hx   . Early death Neg Hx   . Hearing loss Neg Hx   . Hyperlipidemia Neg Hx   . Kidney disease Neg Hx   . Learning disabilities Neg Hx   . Mental illness Neg Hx   . Mental retardation Neg Hx   . Miscarriages / Stillbirths Neg Hx   . Vision loss Neg Hx   . Varicose Veins Neg Hx      Exam    Uterus:     Pelvic Exam:    Perineum: Normal Perineum   Vulva: Bartholin's, Urethra, Skene's normal   Vagina:  normal mucosa, white discharge   Cervix: multiparous appearance and no cervical motion tenderness   Adnexa: no mass, fullness, tenderness   Bony Pelvis: average  System: Breast:  normal appearance, no masses or tenderness   Skin: normal  coloration and turgor, no rashes    Neurologic: oriented   Extremities: normal strength, tone, and muscle mass   HEENT extra ocular movement intact and sclera clear, anicteric   Mouth/Teeth mucous membranes moist, pharynx normal without lesions   Neck supple   Cardiovascular: regular rate and rhythm, no murmurs or gallops   Respiratory:  appears well, vitals normal, no respiratory distress, acyanotic, normal RR, ear and throat exam is normal, neck free of mass or lymphadenopathy, chest clear, no wheezing, crepitations, rhonchi, normal symmetric air entry   Abdomen: soft, non-tender; bowel sounds normal; no masses,  no organomegaly      Assessment/Plan:    Pregnancy: [id number] 1. Supervision of high risk pregnancy, antepartum New OB labs First screen ordered Flu shot given - GC/Chlamydia probe amp ([place] - Pain Mgmt, Profile 6 Conf w/o  mM, U - Culture, OB Urine - US MFM Fetal Nuchal Translucency; Future - Flu Vaccine QUAD 36+ mos IM - Cytology - PAP  2. History of preterm delivery, currently pregnant Considering 17 P--paperwork filled out--will call back to order if she would like it. 1st pregnancy at 31 wks 2nd at 36 wks on 17 P  3. HSV (herpes simplex virus) anogenital infection Suppression in 3rd trimester   Tara Sanchez 04/17/2016

## 2016-04-17 NOTE — BH Specialist Note (Signed)
Session Start time: 2:10   End Time: 2:30 Total Time:  20 minutes Type of Service: Behavioral Health - Individual/Family Interpreter: No.   Interpreter Name & Language: n/a # [facility]  SUBJECTIVE: Tara Sanchez is a 25 y.o. female  Pt. was referred by Dr Shawnie PonsPratt for:  anxiety and depression. Pt. reports the following symptoms/concerns: Pt states that her primary concerns are becoming easily irritated by FOB and others, not being able to get enough sleep, and having to rely on others for transportation; used to be able to sleep with wine, but needs additional coping with  Duration of problem:  Over one month Severity: moderate Previous treatment: none OBJECTIVE: Mood: Irritable & Affect: Appropriate Risk of harm to self or others: No known risk of harm to self or others Assessments administered: PHQ9: 13/ GAD7: 10  LIFE CONTEXT:  Family & Social: -- Financial trader(Who,family proximity, relationship, friends) 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  Pt may benefit from psychoeducation and brief therapeutic intervention regarding coping with symptoms of anxiety and depression.   PLAN: 1. F/U with behavioral health clinician: One month 2. Behavioral Health meds: none 3. Behavioral recommendations:  -Call Medicaid transportation to set up rides for medical appointments -Call [place] to arrange best time to get [place] -Read educational material regarding coping with symptoms of anxiety and depression -Consider Relax Melodies  app to use at bedtime for improved sleep 4. Referral: Brief Counseling/Psychotherapy, Publishing rights managerCommunity Resource and Psychoeducation 5. From scale of 1-10, how likely are you to follow plan: Undetermined  Woc-Behavioral Health Clinician  Behavioral Health Clinician  Marlon PelWarmhandoff:   Warm Hand Off Completed.        [facility] screen [facility] 04/17/2016 11/08/2014  Decreased Interest 3 0  Down, Depressed, Hopeless 2 0  PHQ - 2 Score 5 0  Altered sleeping 3 -  Tired, decreased energy 3 -  Change in appetite 2 -  Feeling bad or failure about yourself  0 -  Trouble concentrating 0 -  Moving slowly or fidgety/restless 0 -  Suicidal thoughts 0 -  PHQ-9 Score 13 -   GAD 7 : Generalized Anxiety Score 04/17/2016  Nervous, Anxious, on Edge 1  Control/stop worrying 0  Worry too much - different things 3  Trouble relaxing 3  Restless 0  Easily annoyed or irritable 3  Afraid - awful might happen 0  Total GAD 7 Score 10

## 2016-04-17 NOTE — Patient Instructions (Addendum)
Second Trimester of Pregnancy The second trimester is from week 13 through week 28 (months 4 through 6). The second trimester is often a time when you feel your best. Your body has also adjusted to being pregnant, and you begin to feel better physically. Usually, morning sickness has lessened or quit completely, you may have more energy, and you may have an increase in appetite. The second trimester is also a time when the fetus is growing rapidly. At the end of the sixth month, the fetus is about 9 inches long and weighs about 1 pounds. You will likely begin to feel the baby move (quickening) between 18 and 20 weeks of the pregnancy. Body changes during your second trimester Your body continues to go through many changes during your second trimester. The changes vary from woman to woman.  Your weight will continue to increase. You will notice your lower abdomen bulging out.  You may begin to get stretch marks on your hips, abdomen, and breasts.  You may develop headaches that can be relieved by medicines. The medicines should be approved by your health care provider.  You may urinate more often because the fetus is pressing on your bladder.  You may develop or continue to have heartburn as a result of your pregnancy.  You may develop constipation because certain hormones are causing the muscles that push waste through your intestines to slow down.  You may develop hemorrhoids or swollen, bulging veins (varicose veins).  You may have back pain. This is caused by:  Weight gain.  Pregnancy hormones that are relaxing the joints in your pelvis.  A shift in weight and the muscles that support your balance.  Your breasts will continue to grow and they will continue to become tender.  Your gums may bleed and may be sensitive to brushing and flossing.  Dark spots or blotches (chloasma, mask of pregnancy) may develop on your face. This will likely fade after the baby is born.  A dark line  from your belly button to the pubic area (linea nigra) may appear. This will likely fade after the baby is born.  You may have changes in your hair. These can include thickening of your hair, rapid growth, and changes in texture. Some women also have hair loss during or after pregnancy, or hair that feels dry or thin. Your hair will most likely return to normal after your baby is born. What to expect at prenatal visits During a routine prenatal visit:  You will be weighed to make sure you and the fetus are growing normally.  Your blood pressure will be taken.  Your abdomen will be measured to track your baby's growth.  The fetal heartbeat will be listened to.  Any test results from the previous visit will be discussed. Your health care provider may ask you:  How you are feeling.  If you are feeling the baby move.  If you have had any abnormal symptoms, such as leaking fluid, bleeding, severe headaches, or abdominal cramping.  If you are using any tobacco products, including cigarettes, chewing tobacco, and electronic cigarettes.  If you have any questions. Other tests that may be performed during your second trimester include:  Blood tests that check for:  Low iron levels (anemia).  Gestational diabetes (between 24 and 28 weeks).  Rh antibodies. This is to check for a protein on red blood cells (Rh factor).  Urine tests to check for infections, diabetes, or protein in the urine.  An ultrasound to   confirm the proper growth and development of the baby.  An amniocentesis to check for possible genetic problems.  Fetal screens for spina bifida and Down syndrome.  HIV (human immunodeficiency virus) testing. Routine prenatal testing includes screening for HIV, unless you choose not to have this test. Follow these instructions at home: Eating and drinking  Continue to eat regular, healthy meals.  Avoid raw meat, uncooked cheese, cat litter boxes, and soil used by cats. These  carry germs that can cause birth defects in the baby.  Take your prenatal vitamins.  Take 1500-2000 mg of calcium daily starting at the 20th week of pregnancy until you deliver your baby.  If you develop constipation:  Take over-the-counter or prescription medicines.  Drink enough fluid to keep your urine clear or pale yellow.  Eat foods that are high in fiber, such as fresh fruits and vegetables, whole grains, and beans.  Limit foods that are high in fat and processed sugars, such as fried and sweet foods. Activity  Exercise only as directed by your health care provider. Experiencing uterine cramps is a good sign to stop exercising.  Avoid heavy lifting, wear low heel shoes, and practice good posture.  Wear your seat belt at all times when driving.  Rest with your legs elevated if you have leg cramps or low back pain.  Wear a good support bra for breast tenderness.  Do not use hot tubs, steam rooms, or saunas. Lifestyle  Avoid all smoking, herbs, alcohol, and unprescribed drugs. These chemicals affect the formation and growth of the baby.  Do not use any products that contain nicotine or tobacco, such as cigarettes and e-cigarettes. If you need help quitting, ask your health care provider.  A sexual relationship may be continued unless your health care provider directs you otherwise. General instructions  Follow your health care provider's instructions regarding medicine use. There are medicines that are either safe or unsafe to take during pregnancy.  Take warm sitz baths to soothe any pain or discomfort caused by hemorrhoids. Use hemorrhoid cream if your health care provider approves.  If you develop varicose veins, wear support hose. Elevate your feet for 15 minutes, 3-4 times a day. Limit salt in your diet.  Visit your dentist if you have not gone yet during your pregnancy. Use a soft toothbrush to brush your teeth and be gentle when you floss.  Keep all follow-up  prenatal visits as told by your health care provider. This is important. Contact a health care provider if:  You have dizziness.  You have mild pelvic cramps, pelvic pressure, or nagging pain in the abdominal area.  You have persistent nausea, vomiting, or diarrhea.  You have a bad smelling vaginal discharge.  You have pain with urination. Get help right away if:  You have a fever.  You are leaking fluid from your vagina.  You have spotting or bleeding from your vagina.  You have severe abdominal cramping or pain.  You have rapid weight gain or weight loss.  You have shortness of breath with chest pain.  You notice sudden or extreme swelling of your face, hands, ankles, feet, or legs.  You have not felt your baby move in over an hour.  You have severe headaches that do not go away with medicine.  You have vision changes. Summary  The second trimester is from week 13 through week 28 (months 4 through 6). It is also a time when the fetus is growing rapidly.  Your body goes   through many changes during pregnancy. The changes vary from woman to woman.  Avoid all smoking, herbs, alcohol, and unprescribed drugs. These chemicals affect the formation and growth your baby.  Do not use any tobacco products, such as cigarettes, chewing tobacco, and e-cigarettes. If you need help quitting, ask your health care provider.  Contact your health care provider if you have any questions. Keep all prenatal visits as told by your health care provider. This is important. This information is not intended to replace advice given to you by your health care provider. Make sure you discuss any questions you have with your health care provider. Document Released: 04/01/2001 Document Revised: 09/13/2015 Document Reviewed: 06/08/2012 Elsevier Interactive Patient Education  2017 [employer].   Breastfeeding Deciding to breastfeed is one of the best choices you can make for you and your baby. A  change in hormones during pregnancy causes your breast tissue to grow and increases the number and size of your milk ducts. These hormones also allow proteins, sugars, and fats from your blood supply to make breast milk in your milk-producing glands. Hormones prevent breast milk from being released before your baby is born as well as prompt milk flow after birth. Once breastfeeding has begun, thoughts of your baby, as well as his or her sucking or crying, can stimulate the release of milk from your milk-producing glands. Benefits of breastfeeding For Your Baby  Your first milk (colostrum) helps your baby's digestive system function better.  There are antibodies in your milk that help your baby fight off infections.  Your baby has a lower incidence of asthma, allergies, and sudden infant death syndrome.  The nutrients in breast milk are better for your baby than infant formulas and are designed uniquely for your baby's needs.  Breast milk improves your baby's brain development.  Your baby is less likely to develop other conditions, such as childhood obesity, asthma, or type 2 diabetes mellitus. For You  Breastfeeding helps to create a very special bond between you and your baby.  Breastfeeding is convenient. Breast milk is always available at the correct temperature and costs nothing.  Breastfeeding helps to burn calories and helps you lose the weight gained during pregnancy.  Breastfeeding makes your uterus contract to its prepregnancy size faster and slows bleeding (lochia) after you give birth.  Breastfeeding helps to lower your risk of developing type 2 diabetes mellitus, osteoporosis, and breast or ovarian cancer later in life. Signs that your baby is hungry Early Signs of Hunger  Increased alertness or activity.  Stretching.  Movement of the head from side to side.  Movement of the head and opening of the mouth when the corner of the mouth or cheek is stroked  (rooting).  Increased sucking sounds, smacking lips, cooing, sighing, or squeaking.  Hand-to-mouth movements.  Increased sucking of fingers or hands. Late Signs of Hunger  Fussing.  Intermittent crying. Extreme Signs of Hunger  Signs of extreme hunger will require calming and consoling before your baby will be able to breastfeed successfully. Do not wait for the following signs of extreme hunger to occur before you initiate breastfeeding:  Restlessness.  A loud, strong cry.  Screaming. Breastfeeding basics  Breastfeeding Initiation  Find a comfortable place to sit or lie down, with your neck and back well supported.  Place a pillow or rolled up blanket under your baby to bring him or her to the level of your breast (if you are seated). Nursing pillows are specially designed to help  support your arms and your baby while you breastfeed.  Make sure that your baby's abdomen is facing your abdomen.  Gently massage your breast. With your fingertips, massage from your chest wall toward your nipple in a circular motion. This encourages milk flow. You may need to continue this action during the feeding if your milk flows slowly.  Support your breast with 4 fingers underneath and your thumb above your nipple. Make sure your fingers are well away from your nipple and your baby's mouth.  Stroke your baby's lips gently with your finger or nipple.  When your baby's mouth is open wide enough, quickly bring your baby to your breast, placing your entire nipple and as much of the colored area around your nipple (areola) as possible into your baby's mouth.  More areola should be visible above your baby's upper lip than below the lower lip.  Your baby's tongue should be between his or her lower gum and your breast.  Ensure that your baby's mouth is correctly positioned around your nipple (latched). Your baby's lips should create a seal on your breast and be turned out (everted).  It is common  for your baby to suck about 2-3 minutes in order to start the flow of breast milk. Latching  Teaching your baby how to latch on to your breast properly is very important. An improper latch can cause nipple pain and decreased milk supply for you and poor weight gain in your baby. Also, if your baby is not latched onto your nipple properly, he or she may swallow some air during feeding. This can make your baby fussy. Burping your baby when you switch breasts during the feeding can help to get rid of the air. However, teaching your baby to latch on properly is still the best way to prevent fussiness from swallowing air while breastfeeding. Signs that your baby has successfully latched on to your nipple:  Silent tugging or silent sucking, without causing you pain.  Swallowing heard between every 3-4 sucks.  Muscle movement above and in front of his or her ears while sucking. Signs that your baby has not successfully latched on to nipple:  Sucking sounds or smacking sounds from your baby while breastfeeding.  Nipple pain. If you think your baby has not latched on correctly, slip your finger into the corner of your baby's mouth to break the suction and place it between your baby's gums. Attempt breastfeeding initiation again. Signs of Successful Breastfeeding  Signs from your baby:  A gradual decrease in the number of sucks or complete cessation of sucking.  Falling asleep.  Relaxation of his or her body.  Retention of a small amount of milk in his or her mouth.  Letting go of your breast by himself or herself. Signs from you:  Breasts that have increased in firmness, weight, and size 1-3 hours after feeding.  Breasts that are softer immediately after breastfeeding.  Increased milk volume, as well as a change in milk consistency and color by the fifth day of breastfeeding.  Nipples that are not sore, cracked, or bleeding. Signs That Your Baby is Getting Enough Milk  Wetting at least  1-2 diapers during the first 24 hours after birth.  Wetting at least 5-6 diapers every 24 hours for the first week after birth. The urine should be clear or pale yellow by 5 days after birth.  Wetting 6-8 diapers every 24 hours as your baby continues to grow and develop.  At least 3 stools in   a 24-hour period by age 5 days. The stool should be soft and yellow.  At least 3 stools in a 24-hour period by age 7 days. The stool should be seedy and yellow.  No loss of weight greater than 10% of birth weight during the first 3 days of age.  Average weight gain of 4-7 ounces (113-198 g) per week after age 4 days.  Consistent daily weight gain by age 5 days, without weight loss after the age of 2 weeks. After a feeding, your baby may spit up a small amount. This is common. Breastfeeding frequency and duration Frequent feeding will help you make more milk and can prevent sore nipples and breast engorgement. Breastfeed when you feel the need to reduce the fullness of your breasts or when your baby shows signs of hunger. This is called "breastfeeding on demand." Avoid introducing a pacifier to your baby while you are working to establish breastfeeding (the first 4-6 weeks after your baby is born). After this time you may choose to use a pacifier. Research has shown that pacifier use during the first year of a baby's life decreases the risk of sudden infant death syndrome (SIDS). Allow your baby to feed on each breast as long as he or she wants. Breastfeed until your baby is finished feeding. When your baby unlatches or falls asleep while feeding from the first breast, offer the second breast. Because newborns are often sleepy in the first few weeks of life, you may need to awaken your baby to get him or her to feed. Breastfeeding times will vary from baby to baby. However, the following rules can serve as a guide to help you ensure that your baby is properly fed:  Newborns (babies 4 weeks of age or younger)  may breastfeed every 1-3 hours.  Newborns should not go longer than 3 hours during the day or 5 hours during the night without breastfeeding.  You should breastfeed your baby a minimum of 8 times in a 24-hour period until you begin to introduce solid foods to your baby at around 6 months of age. Breast milk pumping Pumping and storing breast milk allows you to ensure that your baby is exclusively fed your breast milk, even at times when you are unable to breastfeed. This is especially important if you are going back to work while you are still breastfeeding or when you are not able to be present during feedings. Your lactation consultant can give you guidelines on how long it is safe to store breast milk. A breast pump is a machine that allows you to pump milk from your breast into a sterile bottle. The pumped breast milk can then be stored in a refrigerator or freezer. Some breast pumps are operated by hand, while others use electricity. Ask your lactation consultant which type will work best for you. Breast pumps can be purchased, but some hospitals and breastfeeding support groups lease breast pumps on a monthly basis. A lactation consultant can teach you how to hand express breast milk, if you prefer not to use a pump. Caring for your breasts while you breastfeed Nipples can become dry, cracked, and sore while breastfeeding. The following recommendations can help keep your breasts moisturized and healthy:  Avoid using soap on your nipples.  Wear a supportive bra. Although not required, special nursing bras and tank tops are designed to allow access to your breasts for breastfeeding without taking off your entire bra or top. Avoid wearing underwire-style bras or extremely tight   bras.  Air dry your nipples for 3-30minutes after each feeding.  Use only cotton bra pads to absorb leaked breast milk. Leaking of breast milk between feedings is normal.  Use lanolin on your nipples after breastfeeding.  Lanolin helps to maintain your skin's normal moisture barrier. If you use pure lanolin, you do not need to wash it off before feeding your baby again. Pure lanolin is not toxic to your baby. You may also hand express a few drops of breast milk and gently massage that milk into your nipples and allow the milk to air dry. In the first few weeks after giving birth, some women experience extremely full breasts (engorgement). Engorgement can make your breasts feel heavy, warm, and tender to the touch. Engorgement peaks within 3-5 days after you give birth. The following recommendations can help ease engorgement:  Completely empty your breasts while breastfeeding or pumping. You may want to start by applying warm, moist heat (in the shower or with warm water-soaked hand towels) just before feeding or pumping. This increases circulation and helps the milk flow. If your baby does not completely empty your breasts while breastfeeding, pump any extra milk after he or she is finished.  Wear a snug bra (nursing or regular) or tank top for 1-2 days to signal your body to slightly decrease milk production.  Apply ice packs to your breasts, unless this is too uncomfortable for you.  Make sure that your baby is latched on and positioned properly while breastfeeding. If engorgement persists after 48 hours of following these recommendations, contact your health care provider or a Science writer. Overall health care recommendations while breastfeeding  Eat healthy foods. Alternate between meals and snacks, eating 3 of each per day. Because what you eat affects your breast milk, some of the foods may make your baby more irritable than usual. Avoid eating these foods if you are sure that they are negatively affecting your baby.  Drink milk, fruit juice, and water to satisfy your thirst (about 10 glasses a day).  Rest often, relax, and continue to take your prenatal vitamins to prevent fatigue, stress, and  anemia.  Continue breast self-awareness checks.  Avoid chewing and smoking tobacco. Chemicals from cigarettes that pass into breast milk and exposure to secondhand smoke may harm your baby.  Avoid alcohol and drug use, including marijuana. Some medicines that may be harmful to your baby can pass through breast milk. It is important to ask your health care provider before taking any medicine, including all over-the-counter and prescription medicine as well as vitamin and herbal supplements. It is possible to become pregnant while breastfeeding. If birth control is desired, ask your health care provider about options that will be safe for your baby. Contact a health care provider if:  You feel like you want to stop breastfeeding or have become frustrated with breastfeeding.  You have painful breasts or nipples.  Your nipples are cracked or bleeding.  Your breasts are red, tender, or warm.  You have a swollen area on either breast.  You have a fever or chills.  You have nausea or vomiting.  You have drainage other than breast milk from your nipples.  Your breasts do not become full before feedings by the fifth day after you give birth.  You feel sad and depressed.  Your baby is too sleepy to eat well.  Your baby is having trouble sleeping.  Your baby is wetting less than 3 diapers in a 24-hour period.  Your baby  has less than 3 stools in a 24-hour period.  Your baby's skin or the white part of his or her eyes becomes yellow.  Your baby is not gaining weight by 585 days of age. Get help right away if:  Your baby is overly tired (lethargic) and does not want to wake up and feed.  Your baby develops an unexplained fever. This information is not intended to replace advice given to you by your health care provider. Make sure you discuss any questions you have with your health care provider. Document Released: 04/07/2005 Document Revised: 09/19/2015 Document Reviewed:  09/29/2012 Elsevier Interactive Patient Education  2017 [employer]  Preventing Preterm Birth Preterm birth is when your baby is delivered between 20 weeks and 37 weeks of pregnancy. A full-term pregnancy lasts for at least 37 weeks. Preterm birth can be dangerous for your baby because the last few weeks of pregnancy are an important time for your baby's brain and lungs to grow. Many things can cause a baby to be born early. Sometimes the cause is not known. There are certain factors that make you more likely to experience preterm birth, such as:  Having a previous baby born preterm.  Being pregnant with twins or other multiples.  Having had fertility treatment.  Being overweight or underweight at the start of your pregnancy.  Having any of the following during pregnancy:  An infection, including a urinary tract infection (UTI) or an STI (sexually transmitted infection).  High blood pressure.  Diabetes.  Vaginal bleeding.  Being age 23 or older.  Being age 23 or younger.  Getting pregnant within 6 months of a previous pregnancy.  Suffering extreme stress or physical or emotional abuse during pregnancy.  Standing for long periods of time during pregnancy, such as working at a job that requires standing. What are the risks? The most serious risk of preterm birth is that the baby may not survive. This is more likely to happen if a baby is born before 34 weeks. Other risks and complications of preterm birth may include your baby having:  Breathing problems.  Brain damage that affects movement and coordination (cerebral palsy).  Feeding difficulties.  Vision or hearing problems.  Infections or inflammation of the digestive tract (colitis).  Developmental delays.  Learning disabilities.  Higher risk for diabetes, heart disease, and high blood pressure later in life. What can I do to lower my risk? Medical care The most important thing you can do to lower your risk  for preterm birth is to get routine medical care during pregnancy (prenatal care). If you have a high risk of preterm birth, you may be referred to a health care provider who specializes in managing high-risk pregnancies (perinatologist). You may be given medicine to help prevent preterm birth. Lifestyle changes Certain lifestyle changes can also lower your risk of preterm birth:  Wait at least 6 months after a pregnancy to become pregnant again.  Try to plan pregnancy for when you are between 5119 and 695 years old.  Get to a healthy weight before getting pregnant. If you are overweight, work with your health care provider to safely lose weight.  Do not use any products that contain nicotine or tobacco, such as cigarettes and e-cigarettes. If you need help quitting, ask your health care provider.  Do not drink alcohol.  Do not use drugs. Where to find support: For more support, consider:  Talking with your health care provider.  Talking with a therapist or substance abuse counselor,  if you need help quitting.  Working with a diet and nutrition specialist (dietitian) or a Systems analystpersonal trainer to maintain a healthy weight.  Joining a support group. Where to find more information: Learn more about preventing preterm birth from:  Centers for Disease Control and Prevention: http://curry.org/cdc.gov/reproductivehealth/maternalinfanthealth/pretermbirth.htm  March of Dimes: marchofdimes.org/complications/premature-babies.aspx  American Pregnancy Association: americanpregnancy.org/labor-and-birth/premature-labor Contact a health care provider if:  You have any of the following signs of preterm labor before 37 weeks:  A change or increase in vaginal discharge.  Fluid leaking from your vagina.  Pressure or cramps in your lower abdomen.  A backache that does not go away or gets worse.  Regular tightening (contractions) in your lower abdomen. Summary  Preterm birth means having your baby during weeks  20-37 of pregnancy.  Preterm birth may put your baby at risk for physical and mental problems.  Getting good prenatal care can help prevent preterm birth.  You can lower your risk of preterm birth by making certain lifestyle changes, such as not smoking and not using alcohol. This information is not intended to replace advice given to you by your health care provider. Make sure you discuss any questions you have with your health care provider. Document Released: 05/22/2015 Document Revised: 12/15/2015 Document Reviewed: 12/15/2015 Elsevier Interactive Patient Education  2017 [employer]

## 2016-04-18 LAB — CULTURE, OB URINE

## 2016-04-18 LAB — PAIN MGMT, PROFILE 6 CONF W/O MM, U
6 ACETYLMORPHINE: NEGATIVE ng/mL (ref <10)
AMPHETAMINES: NEGATIVE ng/mL (ref <500)
Alcohol Metabolites: NEGATIVE ng/mL (ref <500)
BARBITURATES: NEGATIVE ng/mL (ref <300)
Benzodiazepines: NEGATIVE ng/mL (ref <100)
Cocaine Metabolite: NEGATIVE ng/mL (ref <150)
Creatinine: 154.4 mg/dL (ref >=20.0)
METHADONE METABOLITE: NEGATIVE ng/mL (ref <100)
Marijuana Metabolite: NEGATIVE ng/mL (ref <20)
Opiates: NEGATIVE ng/mL (ref <100)
Oxidant: NEGATIVE ug/mL (ref <200)
Oxycodone: NEGATIVE ng/mL (ref <100)
PH: 6.59 (ref 4.5–9.0)
Phencyclidine: NEGATIVE ng/mL (ref <25)
Please note:: 0

## 2016-04-18 LAB — GC/CHLAMYDIA PROBE AMP (~~LOC~~) NOT AT ARMC
CHLAMYDIA, DNA PROBE: NEGATIVE
NEISSERIA GONORRHEA: NEGATIVE

## 2016-04-21 NOTE — L&D Delivery Note (Signed)
Delivery Note Upon arrival to MAU, patient was completely dilated. At 4:51 AM a viable female was delivered via Vaginal, Spontaneous Delivery (Presentation: LOA).  APGAR: 8, 9; weight pending.   Placenta status: Spontaneous, intact.  Cord: 3 vessels with the following complications: arm x1.  Anesthesia:  none Episiotomy: None Lacerations:  none Suture Repair: n/a Est. Blood Loss (mL):  50  Mom to postpartum.  Baby to Couplet care / Skin to Skin.  Cleone SlimCaroline Neill SNM 10/18/2016, 5:14 AM  Tara Sanchez is a 26 y.o. female [phone] with IUP at 6182w6d admitted for SOL/SROM.  She progressed without augmentation to complete and pushed 10 minutes to deliver.  Cord clamping delayed by several minutes then clamped by SNM and cut by SNM by pt request.  Placenta intact and spontaneous, bleeding minimal.  No laceration repaired. Mom and baby stable prior to transfer to postpartum. She plans on bottle feeding.   I was gloved and present for entire delivery SVD without incident No difficulty with shoulders No lacerations Agree with note I confirm that I have verified the information documented in the SNM's note and that I have also personally reperformed the physical exam and all medical decision making activities.    Tara Sanchez, CNM

## 2016-04-22 LAB — CYTOLOGY - PAP: Diagnosis: NEGATIVE

## 2016-04-23 ENCOUNTER — Encounter (HOSPITAL_COMMUNITY): Payer: Self-pay

## 2016-04-23 ENCOUNTER — Ambulatory Visit (HOSPITAL_COMMUNITY)
Admission: RE | Admit: 2016-04-23 | Discharge: 2016-04-23 | Disposition: A | Discharge status: Home / Self Care | Payer: Medicaid Other | Source: Ambulatory Visit | Attending: Family Medicine | Admitting: Family Medicine

## 2016-04-23 DIAGNOSIS — O09211 Supervision of pregnancy with history of pre-term labor, first trimester: Secondary | ICD-10-CM | POA: Insufficient documentation

## 2016-04-23 DIAGNOSIS — O0990 Supervision of high risk pregnancy, unspecified, unspecified trimester: Secondary | ICD-10-CM

## 2016-04-23 DIAGNOSIS — O09219 Supervision of pregnancy with history of pre-term labor, unspecified trimester: Secondary | ICD-10-CM

## 2016-04-23 DIAGNOSIS — O09899 Supervision of other high risk pregnancies, unspecified trimester: Secondary | ICD-10-CM

## 2016-04-23 DIAGNOSIS — Z3A13 13 weeks gestation of pregnancy: Secondary | ICD-10-CM | POA: Insufficient documentation

## 2016-04-23 DIAGNOSIS — Z3682 Encounter for antenatal screening for nuchal translucency: Secondary | ICD-10-CM | POA: Insufficient documentation

## 2016-05-13 ENCOUNTER — Telehealth: Payer: Self-pay | Admitting: Obstetrics & Gynecology

## 2016-05-13 NOTE — Telephone Encounter (Signed)
Patient called and stated she could not come to her appointment because she was working. I gave her a date, but because she was moving she asked to be moved out until the week of the Feb 12th, and wants to come on that Wednesday.

## 2016-05-14 ENCOUNTER — Encounter: Payer: Self-pay | Admitting: Obstetrics and Gynecology

## 2016-05-20 ENCOUNTER — Other Ambulatory Visit: Payer: Self-pay

## 2016-06-03 ENCOUNTER — Encounter (HOSPITAL_COMMUNITY): Payer: Self-pay | Admitting: *Deleted

## 2016-06-03 ENCOUNTER — Inpatient Hospital Stay (HOSPITAL_COMMUNITY)
Admission: AD | Admit: 2016-06-03 | Discharge: 2016-06-03 | Disposition: A | Discharge status: Home / Self Care | Payer: Medicaid Other | Source: Ambulatory Visit | Attending: Obstetrics and Gynecology | Admitting: Obstetrics and Gynecology

## 2016-06-03 DIAGNOSIS — O09899 Supervision of other high risk pregnancies, unspecified trimester: Secondary | ICD-10-CM

## 2016-06-03 DIAGNOSIS — A749 Chlamydial infection, unspecified: Secondary | ICD-10-CM

## 2016-06-03 DIAGNOSIS — R1030 Lower abdominal pain, unspecified: Secondary | ICD-10-CM | POA: Diagnosis present

## 2016-06-03 DIAGNOSIS — O09219 Supervision of pregnancy with history of pre-term labor, unspecified trimester: Secondary | ICD-10-CM

## 2016-06-03 DIAGNOSIS — O23592 Infection of other part of genital tract in pregnancy, second trimester: Secondary | ICD-10-CM | POA: Diagnosis not present

## 2016-06-03 DIAGNOSIS — B9689 Other specified bacterial agents as the cause of diseases classified elsewhere: Secondary | ICD-10-CM | POA: Diagnosis not present

## 2016-06-03 DIAGNOSIS — O0992 Supervision of high risk pregnancy, unspecified, second trimester: Secondary | ICD-10-CM

## 2016-06-03 DIAGNOSIS — N760 Acute vaginitis: Secondary | ICD-10-CM | POA: Diagnosis not present

## 2016-06-03 DIAGNOSIS — O26899 Other specified pregnancy related conditions, unspecified trimester: Secondary | ICD-10-CM

## 2016-06-03 DIAGNOSIS — Z87891 Personal history of nicotine dependence: Secondary | ICD-10-CM | POA: Diagnosis not present

## 2016-06-03 DIAGNOSIS — A609 Anogenital herpesviral infection, unspecified: Secondary | ICD-10-CM

## 2016-06-03 DIAGNOSIS — Z3A19 19 weeks gestation of pregnancy: Secondary | ICD-10-CM | POA: Insufficient documentation

## 2016-06-03 DIAGNOSIS — O98811 Other maternal infectious and parasitic diseases complicating pregnancy, first trimester: Secondary | ICD-10-CM

## 2016-06-03 DIAGNOSIS — R102 Pelvic and perineal pain: Secondary | ICD-10-CM

## 2016-06-03 LAB — URINALYSIS, ROUTINE W REFLEX MICROSCOPIC
BILIRUBIN URINE: NEGATIVE
GLUCOSE, UA: NEGATIVE mg/dL
Hgb urine dipstick: NEGATIVE
KETONES UR: NEGATIVE mg/dL
Nitrite: NEGATIVE
PH: 7 (ref 5.0–8.0)
PROTEIN: NEGATIVE mg/dL
Specific Gravity, Urine: 1.02 (ref 1.005–1.030)

## 2016-06-03 LAB — WET PREP, GENITAL
Sperm: NONE SEEN
Trich, Wet Prep: NONE SEEN
Yeast Wet Prep HPF POC: NONE SEEN

## 2016-06-03 MED ORDER — METRONIDAZOLE 500 MG PO TABS: 500.0000 mg | ORAL_TABLET | Freq: Two times a day (BID) | ORAL | 0 refills | Status: AC

## 2016-06-03 NOTE — MAU Note (Signed)
Had a fever of 100 last night, down this morning.  Started having pain last night in lower abd after working. Didn't know if it was related or should be concerned

## 2016-06-03 NOTE — MAU Provider Note (Signed)
History     CSN: [id number]  Arrival date and time: 06/03/16 0806   First Provider Initiated Contact with Patient 06/03/16 [phone]      Chief Complaint  Patient presents with  . Abdominal Pain  . Fever   Patient is a 26 year old G5 P2 at 19 weeks and 2 days who presents today with complaints of lower abdominal pain and temperature to 100 last night. She denies any viral symptoms including runny nose cough diarrhea or shortness of breath. She reports she's had lower abdominal pain that is worsening over the past few days. Has a 7-8 out of 10. It affects the bilateral lower quadrants. She does report a white thin vaginal discharge that has been heavier than usual. She states she did have chlamydia earlier in the pregnancy that was treated and has been tested since however her boyfriend had a letter sent to him that said he had chlamydia and she is unsure if he's been treated. She reports fetal movement denies any vaginal bleeding or contractions   Abdominal Pain  This is a new problem. The current episode started yesterday. The onset quality is gradual. The problem occurs constantly. The problem has been gradually worsening. The pain is located in the LLQ, RLQ and suprapubic region. The pain is at a severity of 7/10. The pain is moderate. The quality of the pain is aching and cramping. The abdominal pain does not radiate. Associated symptoms include a fever. Pertinent negatives include no constipation, diarrhea, dysuria, frequency, headaches, hematuria, nausea or vomiting.  Fever   Associated symptoms include abdominal pain. Pertinent negatives include no chest pain, congestion, coughing, diarrhea, headaches, nausea, urinary pain or vomiting.    OB History    Gravida Para Term Preterm AB Living   5 2 0 2 2 2    SAB TAB Ectopic Multiple Live Births     2   0 2      Past Medical History:  Diagnosis Date  . Genital herpes   . Headache   . Infection    UTI    Past Surgical History:   Procedure Laterality Date  . THERAPEUTIC ABORTION      Family History  Problem Relation Age of Onset  . Diabetes Paternal Grandmother   . Asthma Neg Hx   . Cancer Neg Hx   . Heart disease Neg Hx   . Hypertension Neg Hx   . Stroke Neg Hx   . Alcohol abuse Neg Hx   . Arthritis Neg Hx   . Birth defects Neg Hx   . COPD Neg Hx   . Depression Neg Hx   . Drug abuse Neg Hx   . Early death Neg Hx   . Hearing loss Neg Hx   . Hyperlipidemia Neg Hx   . Kidney disease Neg Hx   . Learning disabilities Neg Hx   . Mental illness Neg Hx   . Mental retardation Neg Hx   . Miscarriages / Stillbirths Neg Hx   . Vision loss Neg Hx   . Varicose Veins Neg Hx     Social History  Substance Use Topics  . Smoking status: Former Smoker    Years: 2.00    Types: Cigars  . Smokeless tobacco: Former Neurosurgeon  . Alcohol use Yes     Comment: Social    Allergies:  Allergies  Allergen Reactions  . Macrobid [Nitrofurantoin] Anaphylaxis  . Phenergan [Promethazine Hcl] Other (See Comments)    Reaction:  Severe sedation  . Tessalon [  Benzonatate] Other (See Comments)    Reaction:  Severe sedation    Prescriptions Prior to Admission  Medication Sig Dispense Refill Last Dose  . promethazine (PHENERGAN) 25 MG tablet Take 0.5-1 tablets (12.5-25 mg total) by mouth every 6 (six) hours as needed. (Patient not taking: Reported on 04/23/2016) 30 tablet 0 Not Taking  . valACYclovir (VALTREX) 1000 MG tablet Take 1 tablet (1,000 mg total) by mouth daily. (Patient not taking: Reported on 04/23/2016) 5 tablet 5 Not Taking at Unknown time    Review of Systems  Constitutional: Positive for fever. Negative for chills.  HENT: Negative for congestion and rhinorrhea.   Respiratory: Negative for cough and shortness of breath.   Cardiovascular: Negative for chest pain and palpitations.  Gastrointestinal: Positive for abdominal pain. Negative for constipation, diarrhea, nausea and vomiting.  Genitourinary: Negative for  dysuria, flank pain, frequency and hematuria.  Neurological: Negative for dizziness and headaches.   Physical Exam   Blood pressure 113/56, pulse 63, temperature 98 F (36.7 C), temperature source Oral, resp. rate 16, weight 164 lb 8 oz (74.6 kg), last menstrual period 01/07/2016, not currently breastfeeding.  Physical Exam  Constitutional: She appears well-developed and well-nourished.  Cardiovascular: Normal rate and intact distal pulses.   Respiratory: Effort normal. No respiratory distress.  GI: Soft. Bowel sounds are normal. She exhibits no distension. There is tenderness. There is no rebound and no guarding.  Mild tenderness to palpation bilateral lower quadrants and suprapubic area  Genitourinary:  Genitourinary Comments: Significant thin whitish green vaginal discharge, no cervicitis, positive cervical motion tenderness healthy pink vaginal mucosa.  Skin: Skin is warm and dry.  Psychiatric: She has a normal mood and affect. Her behavior is normal.    MAU Course  Procedures  MDM In MA U patient underwent -Urinalysis which appears to be grossly contaminated -Wet prep which revealed bacterial vaginosis -Gonorrhea and chlamydia testing which is pending.  Fetal heart tones were obtained and were found to be 153 bpm  Assessment and Plan  #1: Bacterial vaginosis, likely the cause of patient's vaginal discharge but unclear if it's the cause of patient's pain.  #2: Abdominal pain: Given that patient has been treated for chlamydia and had several negative tests since will not treat at this time until results are back. Bacterial vaginosis may be causing cramping pain or this may be round ligament pain. Patient is stable and ready for discharge.  Ernestina Pennaicholas Christyne Mccain 06/03/2016, 8:43 AM

## 2016-06-03 NOTE — Discharge Instructions (Signed)
Abdominal Pain During Pregnancy °Abdominal pain is common in pregnancy. Most of the time, it does not cause harm. There are many causes of abdominal pain. Some causes are more serious than others and sometimes the cause is not known. Abdominal pain can be a sign that something is very wrong with the pregnancy or the pain may have nothing to do with the pregnancy. Always tell your health care provider if you have any abdominal pain. °Follow these instructions at home: °· Do not have sex or put anything in your vagina until your symptoms go away completely. °· Watch your abdominal pain for any changes. °· Get plenty of rest until your pain improves. °· Drink enough fluid to keep your urine clear or pale yellow. °· Take over-the-counter or prescription medicines only as told by your health care provider. °· Keep all follow-up visits as told by your health care provider. This is important. °Contact a health care provider if: °· You have a fever. °· Your pain gets worse or you have cramping. °· Your pain continues after resting. °Get help right away if: °· You are bleeding, leaking fluid, or passing tissue from the vagina. °· You have vomiting or diarrhea that does not go away. °· You have painful or bloody urination. °· You notice a decrease in your baby's movements. °· You feel very weak or faint. °· You have shortness of breath. °· You develop a severe headache with abdominal pain. °· You have abnormal vaginal discharge with abdominal pain. °This information is not intended to replace advice given to you by your health care provider. Make sure you discuss any questions you have with your health care provider. °Document Released: 04/07/2005 Document Revised: 01/17/2016 Document Reviewed: 11/04/2012 °Elsevier Interactive Patient Education © 2017 Elsevier Inc. ° °Bacterial Vaginosis °Bacterial vaginosis is an infection of the vagina. It happens when too many germs (bacteria) grow in the vagina. This infection puts you at  risk for infections from sex (STIs). Treating this infection can lower your risk for some STIs. You should also treat this if you are pregnant. It can cause your baby to be born early. °Follow these instructions at home: °Medicines °· Take over-the-counter and prescription medicines only as told by your doctor. °· Take or use your antibiotic medicine as told by your doctor. Do not stop taking or using it even if you start to feel better. °General instructions °· If you your sexual partner is a woman, tell her that you have this infection. She needs to get treatment if she has symptoms. If you have a female partner, he does not need to be treated. °· During treatment: °¨ Avoid sex. °¨ Do not douche. °¨ Avoid alcohol as told. °¨ Avoid breastfeeding as told. °· Drink enough fluid to keep your pee (urine) clear or pale yellow. °· Keep your vagina and butt (rectum) clean. °¨ Wash the area with warm water every day. °¨ Wipe from front to back after you use the toilet. °· Keep all follow-up visits as told by your doctor. This is important. °Preventing this condition °· Do not douche. °· Use only warm water to wash around your vagina. °· Use protection when you have sex. This includes: °¨ Latex condoms. °¨ Dental dams. °· Limit how many people you have sex with. It is best to only have sex with the same person (be monogamous). °· Get tested for STIs. Have your partner get tested. °· Wear underwear that is cotton or lined with cotton. °· Avoid   tight pants and pantyhose. This is most important in summer. °· Do not use any products that have nicotine or tobacco in them. These include cigarettes and e-cigarettes. If you need help quitting, ask your doctor. °· Do not use illegal drugs. °· Limit how much alcohol you drink. °Contact a doctor if: °· Your symptoms do not get better, even after you are treated. °· You have more discharge or pain when you pee (urinate). °· You have a fever. °· You have pain in your belly  (abdomen). °· You have pain with sex. °· Your bleed from your vagina between periods. °Summary °· This infection happens when too many germs (bacteria) grow in the vagina. °· Treating this condition can lower your risk for some infections from sex (STIs). °· You should also treat this if you are pregnant. It can cause early (premature) birth. °· Do not stop taking or using your antibiotic medicine even if you start to feel better. °This information is not intended to replace advice given to you by your health care provider. Make sure you discuss any questions you have with your health care provider. °Document Released: 01/15/2008 Document Revised: 12/22/2015 Document Reviewed: 12/22/2015 °Elsevier Interactive Patient Education © 2017 Elsevier Inc. ° °

## 2016-06-03 NOTE — MAU Note (Signed)
C/o lower abdominal pain for past 2 days; ? STD exposure; states that she had a temp of  100;

## 2016-06-04 ENCOUNTER — Encounter: Payer: Self-pay | Admitting: General Practice

## 2016-06-04 ENCOUNTER — Encounter: Payer: Self-pay | Admitting: Obstetrics & Gynecology

## 2016-06-04 LAB — GC/CHLAMYDIA PROBE AMP (~~LOC~~) NOT AT ARMC
Chlamydia: NEGATIVE
NEISSERIA GONORRHEA: NEGATIVE

## 2016-06-05 ENCOUNTER — Encounter: Payer: Self-pay | Admitting: General Practice

## 2016-06-16 ENCOUNTER — Ambulatory Visit (INDEPENDENT_AMBULATORY_CARE_PROVIDER_SITE_OTHER): Payer: Medicaid Other | Admitting: Family Medicine

## 2016-06-16 VITALS — BP 112/62 | HR 84 | Wt 166.4 lb

## 2016-06-16 DIAGNOSIS — Z3492 Encounter for supervision of normal pregnancy, unspecified, second trimester: Secondary | ICD-10-CM

## 2016-06-16 DIAGNOSIS — O98312 Other infections with a predominantly sexual mode of transmission complicating pregnancy, second trimester: Secondary | ICD-10-CM | POA: Diagnosis not present

## 2016-06-16 DIAGNOSIS — O0992 Supervision of high risk pregnancy, unspecified, second trimester: Secondary | ICD-10-CM

## 2016-06-16 DIAGNOSIS — M549 Dorsalgia, unspecified: Secondary | ICD-10-CM

## 2016-06-16 DIAGNOSIS — M9903 Segmental and somatic dysfunction of lumbar region: Secondary | ICD-10-CM | POA: Diagnosis not present

## 2016-06-16 DIAGNOSIS — M9905 Segmental and somatic dysfunction of pelvic region: Secondary | ICD-10-CM

## 2016-06-16 DIAGNOSIS — M9904 Segmental and somatic dysfunction of sacral region: Secondary | ICD-10-CM | POA: Diagnosis not present

## 2016-06-16 DIAGNOSIS — A609 Anogenital herpesviral infection, unspecified: Secondary | ICD-10-CM

## 2016-06-16 DIAGNOSIS — O09212 Supervision of pregnancy with history of pre-term labor, second trimester: Secondary | ICD-10-CM | POA: Diagnosis present

## 2016-06-16 DIAGNOSIS — O9989 Other specified diseases and conditions complicating pregnancy, childbirth and the puerperium: Secondary | ICD-10-CM

## 2016-06-16 DIAGNOSIS — A6009 Herpesviral infection of other urogenital tract: Secondary | ICD-10-CM | POA: Diagnosis not present

## 2016-06-16 DIAGNOSIS — O09219 Supervision of pregnancy with history of pre-term labor, unspecified trimester: Secondary | ICD-10-CM

## 2016-06-16 DIAGNOSIS — O99891 Other specified diseases and conditions complicating pregnancy: Secondary | ICD-10-CM

## 2016-06-16 DIAGNOSIS — O09899 Supervision of other high risk pregnancies, unspecified trimester: Secondary | ICD-10-CM

## 2016-06-16 DIAGNOSIS — O26892 Other specified pregnancy related conditions, second trimester: Secondary | ICD-10-CM

## 2016-06-16 NOTE — Progress Notes (Signed)
   PRENATAL VISIT NOTE  Subjective:  Tara Sanchez is a 26 y.o. [id number] at [redacted]w[redacted]d being seen today for ongoing prenatal care.  She is currently monitored for the following issues for this high-risk pregnancy and has H/O 2 abortions; Maternal varicella, non-immune; Chlamydia infection complicating pregnancy in first trimester; Supervision of high-risk pregnancy; History of preterm delivery, currently pregnant; and HSV (herpes simplex virus) anogenital infection on her problem list.  Patient reports occasional cramping. Having back pain with radiation down leg. Contractions: Not present. Vag. Bleeding: None.  Movement: Present. Denies leaking of fluid.   The following portions of the patient's history were reviewed and updated as appropriate: allergies, current medications, past family history, past medical history, past social history, past surgical history and problem list. Problem list updated.  Objective:   Vitals:   06/16/16 1609  BP: 112/62  Pulse: 84  Weight: 166 lb 6.4 oz (75.5 kg)    Fetal Status:     Movement: Present     General:  Alert, oriented and cooperative. Patient is in no acute distress.  Skin: Skin is warm and dry. No rash noted.   Cardiovascular: Normal heart rate noted  Respiratory: Normal respiratory effort, no problems with respiration noted  Abdomen: Soft, gravid, appropriate for gestational age. Pain/Pressure: Present     Pelvic:  Cervical exam deferred      Pt declined  MSK: Restriction, tenderness, tissue texture changes, and paraspinal spasm in the right lumbar spine  Neuro: Moves all four extremities with no focal neurological deficit  Extremities: Normal range of motion.  Edema: None  Mental Status: Normal mood and affect. Normal behavior. Normal judgment and thought content.   OSE: Head   Cervical   Thoracic   Rib   Lumbar L5 ESRL  Sacrum L/L torsion  Pelvis Right ant innom    Assessment and Plan:  Pregnancy: [id number] at [redacted]w[redacted]d  1. Encounter for  supervision of low-risk pregnancy in second trimester FHT and FH normal - [name] MFM OB COMP + 14 WK; Future - AFP, Quad Screen - Prenatal Profile I  2. HSV (herpes simplex virus) anogenital infection Prophylaxis at 36 weeks - AFP, Quad Screen - Prenatal Profile I  3. History of preterm delivery, currently pregnant Pt declines makeena. - AFP, Quad Screen - Prenatal Profile I  4. Back pain affecting pregnancy in second trimester 5. Somatic dysfunction of lumbar region 6. Somatic dysfunction of sacral region 7. Somatic dysfunction of pelvis region OMT done after patient permission. HVLA technique utilized. Patient tolerated procedure well.    Preterm labor symptoms and general obstetric precautions including but not limited to vaginal bleeding, contractions, leaking of fluid and fetal movement were reviewed in detail with the patient. Please refer to After Visit Summary for other counseling recommendations.  No Follow-up on file.  Levie Heritage, DO

## 2016-06-18 ENCOUNTER — Ambulatory Visit (HOSPITAL_COMMUNITY)
Admission: RE | Admit: 2016-06-18 | Discharge: 2016-06-18 | Disposition: A | Discharge status: Home / Self Care | Payer: Medicaid Other | Source: Ambulatory Visit | Attending: Family Medicine | Admitting: Family Medicine

## 2016-06-18 DIAGNOSIS — Z3A21 21 weeks gestation of pregnancy: Secondary | ICD-10-CM | POA: Insufficient documentation

## 2016-06-18 DIAGNOSIS — O09212 Supervision of pregnancy with history of pre-term labor, second trimester: Secondary | ICD-10-CM | POA: Diagnosis not present

## 2016-06-18 DIAGNOSIS — Z3492 Encounter for supervision of normal pregnancy, unspecified, second trimester: Secondary | ICD-10-CM

## 2016-06-18 DIAGNOSIS — Z3689 Encounter for other specified antenatal screening: Secondary | ICD-10-CM | POA: Insufficient documentation

## 2016-06-18 NOTE — Progress Notes (Signed)
Addendum:  Completed medicaid home form

## 2016-06-26 ENCOUNTER — Encounter (HOSPITAL_COMMUNITY): Payer: Self-pay

## 2016-06-26 ENCOUNTER — Inpatient Hospital Stay (HOSPITAL_COMMUNITY)
Admission: AD | Admit: 2016-06-26 | Discharge: 2016-06-26 | Disposition: A | Discharge status: Home / Self Care | Payer: Medicaid Other | Source: Ambulatory Visit | Attending: Obstetrics and Gynecology | Admitting: Obstetrics and Gynecology

## 2016-06-26 DIAGNOSIS — B9789 Other viral agents as the cause of diseases classified elsewhere: Secondary | ICD-10-CM | POA: Diagnosis not present

## 2016-06-26 DIAGNOSIS — J069 Acute upper respiratory infection, unspecified: Secondary | ICD-10-CM | POA: Diagnosis not present

## 2016-06-26 DIAGNOSIS — Z3A22 22 weeks gestation of pregnancy: Secondary | ICD-10-CM | POA: Diagnosis not present

## 2016-06-26 DIAGNOSIS — O0992 Supervision of high risk pregnancy, unspecified, second trimester: Secondary | ICD-10-CM

## 2016-06-26 DIAGNOSIS — R0989 Other specified symptoms and signs involving the circulatory and respiratory systems: Secondary | ICD-10-CM | POA: Insufficient documentation

## 2016-06-26 DIAGNOSIS — J029 Acute pharyngitis, unspecified: Secondary | ICD-10-CM | POA: Insufficient documentation

## 2016-06-26 DIAGNOSIS — O09899 Supervision of other high risk pregnancies, unspecified trimester: Secondary | ICD-10-CM

## 2016-06-26 DIAGNOSIS — Z87891 Personal history of nicotine dependence: Secondary | ICD-10-CM | POA: Insufficient documentation

## 2016-06-26 DIAGNOSIS — A609 Anogenital herpesviral infection, unspecified: Secondary | ICD-10-CM

## 2016-06-26 DIAGNOSIS — M791 Myalgia: Secondary | ICD-10-CM | POA: Insufficient documentation

## 2016-06-26 DIAGNOSIS — O26892 Other specified pregnancy related conditions, second trimester: Secondary | ICD-10-CM | POA: Diagnosis present

## 2016-06-26 DIAGNOSIS — O99512 Diseases of the respiratory system complicating pregnancy, second trimester: Secondary | ICD-10-CM | POA: Insufficient documentation

## 2016-06-26 DIAGNOSIS — A749 Chlamydial infection, unspecified: Secondary | ICD-10-CM

## 2016-06-26 DIAGNOSIS — O09219 Supervision of pregnancy with history of pre-term labor, unspecified trimester: Secondary | ICD-10-CM

## 2016-06-26 DIAGNOSIS — O98811 Other maternal infectious and parasitic diseases complicating pregnancy, first trimester: Secondary | ICD-10-CM

## 2016-06-26 LAB — PRENATAL PROFILE I(LABCORP)
ANTIBODY SCREEN: NEGATIVE
BASOS: 0 %
Basophils Absolute: 0 10*3/uL (ref 0.0–0.2)
EOS (ABSOLUTE): 0.1 10*3/uL (ref 0.0–0.4)
EOS: 1 %
HEMATOCRIT: 39.6 % (ref 34.0–46.6)
HEP B S AG: NEGATIVE
Hemoglobin: 13.4 g/dL (ref 11.1–15.9)
Immature Grans (Abs): 0 10*3/uL (ref 0.0–0.1)
Immature Granulocytes: 0 %
Lymphocytes Absolute: 2.5 10*3/uL (ref 0.7–3.1)
Lymphs: 22 %
MCH: 31.1 pg (ref 26.6–33.0)
MCHC: 33.8 g/dL (ref 31.5–35.7)
MCV: 92 fL (ref 79–97)
MONOCYTES: 7 %
Monocytes Absolute: 0.7 10*3/uL (ref 0.1–0.9)
Neutrophils Absolute: 7.9 10*3/uL — ABNORMAL HIGH (ref 1.4–7.0)
Neutrophils: 70 %
Platelets: 257 10*3/uL (ref 150–379)
RBC: 4.31 x10E6/uL (ref 3.77–5.28)
RDW: 14.1 % (ref 12.3–15.4)
RH TYPE: POSITIVE
RPR: NONREACTIVE
RUBELLA: 11.9 {index} (ref >0.99)
WBC: 11.2 10*3/uL — ABNORMAL HIGH (ref 3.4–10.8)

## 2016-06-26 LAB — AFP, QUAD SCREEN
DIA VALUE (EIA): 126.61 pg/mL
DSR (By Age)    1 IN: 1060
Gestational Age: 22.4 WEEKS
MSAFP Mom: 0.67
MSAFP: 53.9 ng/mL
MSHCG: 23905 m[IU]/mL
Maternal Age At EDD: 24.3 YEARS
Osb Risk: 10000
Weight: 166 [lb_av]
uE3 Value: 1.59 ng/mL

## 2016-06-26 LAB — URINALYSIS, ROUTINE W REFLEX MICROSCOPIC
Bilirubin Urine: NEGATIVE
Glucose, UA: NEGATIVE mg/dL
HGB URINE DIPSTICK: NEGATIVE
KETONES UR: NEGATIVE mg/dL
NITRITE: NEGATIVE
PROTEIN: NEGATIVE mg/dL
Specific Gravity, Urine: 1.019 (ref 1.005–1.030)
pH: 7 (ref 5.0–8.0)

## 2016-06-26 LAB — INFLUENZA PANEL BY PCR (TYPE A & B)
INFLAPCR: NEGATIVE
Influenza B By PCR: NEGATIVE

## 2016-06-26 NOTE — MAU Provider Note (Signed)
History    CSN: [id number]  Arrival date and time: 06/26/16 1155  Chief Complaint  Patient presents with  . Cough  . Sore Throat   HPI  26 yo [id number] at [redacted]w[redacted]d who presents today with upper airway symptoms x3-4 days.  Her partner has been sick with "a cold".  She has had productive cougth, congestion, sore throat, and myalgias.  Her highest temperature at home was 100.0.  She does not have rash, chest pain, trouble breathing, dysuria, abdominal pain, or diarrhea.   Feeling baby move.  No contractions, vaginal bleeding, or loss of fluid.  OB History    Gravida Para Term Preterm AB Living   5 2 0 2 2 2    SAB TAB Ectopic Multiple Live Births     2   0 2      Past Medical History:  Diagnosis Date  . Genital herpes   . Headache   . Infection    UTI    Past Surgical History:  Procedure Laterality Date  . THERAPEUTIC ABORTION      Family History  Problem Relation Age of Onset  . Diabetes Paternal Grandmother   . Asthma Neg Hx   . Cancer Neg Hx   . Heart disease Neg Hx   . Hypertension Neg Hx   . Stroke Neg Hx   . Alcohol abuse Neg Hx   . Arthritis Neg Hx   . Birth defects Neg Hx   . COPD Neg Hx   . Depression Neg Hx   . Drug abuse Neg Hx   . Early death Neg Hx   . Hearing loss Neg Hx   . Hyperlipidemia Neg Hx   . Kidney disease Neg Hx   . Learning disabilities Neg Hx   . Mental illness Neg Hx   . Mental retardation Neg Hx   . Miscarriages / Stillbirths Neg Hx   . Vision loss Neg Hx   . Varicose Veins Neg Hx     Social History  Substance Use Topics  . Smoking status: Former Smoker    Years: 2.00    Types: Cigars  . Smokeless tobacco: Former Neurosurgeon  . Alcohol use Yes     Comment: Social    Allergies:  Allergies  Allergen Reactions  . Macrobid [Nitrofurantoin] Anaphylaxis  . Phenergan [Promethazine Hcl] Other (See Comments)    Reaction:  Severe sedation  . Tessalon [Benzonatate] Other (See Comments)    Reaction:  Severe sedation    Prescriptions  Prior to Admission  Medication Sig Dispense Refill Last Dose  . promethazine (PHENERGAN) 25 MG tablet Take 0.5-1 tablets (12.5-25 mg total) by mouth every 6 (six) hours as needed. (Patient not taking: Reported on 04/23/2016) 30 tablet 0 Not Taking  . valACYclovir (VALTREX) 1000 MG tablet Take 1 tablet (1,000 mg total) by mouth daily. (Patient not taking: Reported on 04/23/2016) 5 tablet 5 Not Taking    Review of Systems  Constitutional: Positive for fatigue. Negative for fever.  HENT: Positive for congestion, postnasal drip, rhinorrhea and sore throat. Negative for sinus pain.   Respiratory: Positive for cough. Negative for shortness of breath.   Cardiovascular: Negative for chest pain and leg swelling.  Gastrointestinal: Negative for abdominal pain, diarrhea, nausea and vomiting.  Genitourinary: Negative for dysuria and vaginal bleeding.  Musculoskeletal: Positive for myalgias.  Skin: Negative for rash.  Neurological: Negative for headaches.   Physical Exam   Blood pressure 109/68, pulse 99, temperature 98.2 F (36.8 C), temperature source Oral,  resp. rate 18, last menstrual period 01/07/2016, not currently breastfeeding.  Physical Exam  Constitutional: She is oriented to person, place, and time. She appears well-developed and well-nourished.  HENT:  Head: Normocephalic.  Right Ear: External ear normal.  Left Ear: External ear normal.  Moist mucus membranes, mild posterior pharynx erythema, no cobblestoning or exudate  Eyes: Conjunctivae and EOM are normal. Right eye exhibits no discharge. Left eye exhibits no discharge.  Neck: Normal range of motion. Neck supple.  Mild tender enlargement of cervical nodes  Cardiovascular: Normal rate and regular rhythm.   No murmur heard. Respiratory: Effort normal and breath sounds normal. She has no wheezes. She has no rales.  GI: Soft. There is no tenderness.  gravid  Musculoskeletal: She exhibits no edema.  Neurological: She is alert and  oriented to person, place, and time. She has normal reflexes.  Skin: Skin is warm and dry. No rash noted.  Psychiatric: She has a normal mood and affect.    MAU Course  Procedures  MDM -flu swab negative  Assessment and Plan  26 yo [id number] at 2171w4d  Symptoms consistent with viral URI, discussed diagnosis with patient Supportive care measures recommended including tylenol, and tea with honey for cough  Discharge home with routine follow up with OB provider.  Charlsie MerlesJulia Rhoden 06/26/2016, 1:13 PM    OB FELLOW MAU DISCHARGE ATTESTATION  I have seen and examined this patient; I agree with above documentation in the resident's note.    Jen MowElizabeth Sherlon Nied, DO OB Fellow

## 2016-06-26 NOTE — MAU Note (Addendum)
Pt C/O coughing up green sputum, has had a fever of 100 last night, has sore throat, is hoarse.  Has abdominal pain with coughing. Tooke tylenol last night for fever. Denies bleeding or LOF.

## 2016-07-14 ENCOUNTER — Encounter: Payer: Self-pay | Admitting: *Deleted

## 2016-07-14 ENCOUNTER — Encounter: Payer: Self-pay | Admitting: Obstetrics & Gynecology

## 2016-07-14 ENCOUNTER — Ambulatory Visit (INDEPENDENT_AMBULATORY_CARE_PROVIDER_SITE_OTHER): Payer: Medicaid Other | Admitting: Obstetrics & Gynecology

## 2016-07-14 VITALS — BP 108/64 | HR 72 | Wt 175.4 lb

## 2016-07-14 DIAGNOSIS — O09219 Supervision of pregnancy with history of pre-term labor, unspecified trimester: Secondary | ICD-10-CM

## 2016-07-14 DIAGNOSIS — M79605 Pain in left leg: Secondary | ICD-10-CM

## 2016-07-14 DIAGNOSIS — O98312 Other infections with a predominantly sexual mode of transmission complicating pregnancy, second trimester: Secondary | ICD-10-CM

## 2016-07-14 DIAGNOSIS — A749 Chlamydial infection, unspecified: Secondary | ICD-10-CM

## 2016-07-14 DIAGNOSIS — W108XXA Fall (on) (from) other stairs and steps, initial encounter: Secondary | ICD-10-CM

## 2016-07-14 DIAGNOSIS — O98811 Other maternal infectious and parasitic diseases complicating pregnancy, first trimester: Secondary | ICD-10-CM

## 2016-07-14 DIAGNOSIS — O09212 Supervision of pregnancy with history of pre-term labor, second trimester: Secondary | ICD-10-CM

## 2016-07-14 DIAGNOSIS — O9989 Other specified diseases and conditions complicating pregnancy, childbirth and the puerperium: Secondary | ICD-10-CM

## 2016-07-14 DIAGNOSIS — O09899 Supervision of other high risk pregnancies, unspecified trimester: Secondary | ICD-10-CM

## 2016-07-14 DIAGNOSIS — O0992 Supervision of high risk pregnancy, unspecified, second trimester: Secondary | ICD-10-CM

## 2016-07-14 MED ORDER — FLUCONAZOLE 150 MG PO TABS: 150.0000 mg | ORAL_TABLET | Freq: Once | ORAL | 3 refills | Status: AC

## 2016-07-14 NOTE — Progress Notes (Signed)
     PRENATAL VISIT NOTE  Subjective:  Tara Sanchez is a 26 y.o. [id number] at 7855w1d being seen today for ongoing prenatal care.  She is currently monitored for the following issues for this high-risk pregnancy and has H/O 2 abortions; Maternal varicella, non-immune; Chlamydia infection complicating pregnancy in first trimester; Supervision of high-risk pregnancy; History of preterm delivery, currently pregnant; and HSV (herpes simplex virus) anogenital infection on her problem list.  Patient reports fallen 4-5 times and left leg pain.  Contractions: Not present.  .  Movement: Present. Denies leaking of fluid.   The following portions of the patient's history were reviewed and updated as appropriate: allergies, current medications, past family history, past medical history, past social history, past surgical history and problem list. Problem list updated.  Objective:   Vitals:   07/14/16 0906  BP: 108/64  Pulse: 72  Weight: 175 lb 6.4 oz (79.6 kg)    Fetal Status: Fetal Heart Rate (bpm): 143   Movement: Present     General:  Alert, oriented and cooperative. Patient is in no acute distress.  Skin: Skin is warm and dry. No rash noted.   Cardiovascular: Normal heart rate noted  Respiratory: Normal respiratory effort, no problems with respiration noted  Abdomen: Soft, gravid, appropriate for gestational age. Pain/Pressure: Present     Pelvic:  Cervical exam deferred        Extremities: Normal range of motion.  Edema: None  Mental Status: Normal mood and affect. Normal behavior. Normal judgment and thought content.   Assessment and Plan:  Pregnancy: [id number] at 7255w1d  1. Chlamydia infection complicating pregnancy in first trimester TOC negative - HIV antibody  2. Supervision of high risk pregnancy in second trimester GDM test next visit BTL papers  3. Fall (on) (from) other stairs and steps, initial encounter, Pain of left lower extremity (NEW) - AMB referral to sports  medicine  4. History of preterm delivery, currently pregnant -No contractions.    5.  Severe vaginal itching after taking Flagyl (NEW) -Rx with Diflucan; if still present next visit will do aptima.  Preterm labor symptoms and general obstetric precautions including but not limited to vaginal bleeding, contractions, leaking of fluid and fetal movement were reviewed in detail with the patient. Please refer to After Visit Summary for other counseling recommendations.  Return in about 3 weeks (around 08/04/2016).   Lesly DukesKelly H Yanitza Shvartsman, MD

## 2016-07-14 NOTE — Progress Notes (Signed)
[facility] for 07/15/2016 at 3:00pm

## 2016-07-15 LAB — HIV ANTIBODY (ROUTINE TESTING W REFLEX): HIV Screen 4th Generation wRfx: NONREACTIVE

## 2016-08-05 ENCOUNTER — Encounter: Payer: Self-pay | Admitting: Obstetrics & Gynecology

## 2016-08-05 ENCOUNTER — Encounter: Payer: Self-pay | Admitting: General Practice

## 2016-08-11 ENCOUNTER — Other Ambulatory Visit (HOSPITAL_COMMUNITY)
Admission: RE | Admit: 2016-08-11 | Discharge: 2016-08-11 | Disposition: A | Discharge status: Home / Self Care | Payer: Medicaid Other | Source: Ambulatory Visit | Attending: Obstetrics & Gynecology | Admitting: Obstetrics & Gynecology

## 2016-08-11 ENCOUNTER — Ambulatory Visit (INDEPENDENT_AMBULATORY_CARE_PROVIDER_SITE_OTHER): Payer: Medicaid Other | Admitting: Obstetrics & Gynecology

## 2016-08-11 VITALS — BP 130/58 | HR 73 | Wt 179.8 lb

## 2016-08-11 DIAGNOSIS — O09213 Supervision of pregnancy with history of pre-term labor, third trimester: Secondary | ICD-10-CM | POA: Diagnosis not present

## 2016-08-11 DIAGNOSIS — A749 Chlamydial infection, unspecified: Secondary | ICD-10-CM

## 2016-08-11 DIAGNOSIS — O0993 Supervision of high risk pregnancy, unspecified, third trimester: Secondary | ICD-10-CM | POA: Diagnosis not present

## 2016-08-11 DIAGNOSIS — O0992 Supervision of high risk pregnancy, unspecified, second trimester: Secondary | ICD-10-CM

## 2016-08-11 DIAGNOSIS — O09219 Supervision of pregnancy with history of pre-term labor, unspecified trimester: Principal | ICD-10-CM

## 2016-08-11 DIAGNOSIS — O98813 Other maternal infectious and parasitic diseases complicating pregnancy, third trimester: Secondary | ICD-10-CM

## 2016-08-11 DIAGNOSIS — O09899 Supervision of other high risk pregnancies, unspecified trimester: Secondary | ICD-10-CM

## 2016-08-11 DIAGNOSIS — O98811 Other maternal infectious and parasitic diseases complicating pregnancy, first trimester: Secondary | ICD-10-CM

## 2016-08-11 DIAGNOSIS — Z113 Encounter for screening for infections with a predominantly sexual mode of transmission: Secondary | ICD-10-CM

## 2016-08-11 MED ORDER — FLUCONAZOLE 150 MG PO TABS: 150.0000 mg | ORAL_TABLET | Freq: Once | ORAL | 3 refills | Status: AC

## 2016-08-11 NOTE — Progress Notes (Signed)
Declined flu and tdap  Need to rescheduled 2 hour patient has already has breakfast

## 2016-08-11 NOTE — Progress Notes (Signed)
   PRENATAL VISIT NOTE  Subjective:  Tara Sanchez is a 26 y.o. [id number] at [redacted]w[redacted]d being seen today for ongoing prenatal care.  She is currently monitored for the following issues for this high-risk pregnancy and has H/O 2 abortions; Maternal varicella, non-immune; Chlamydia infection complicating pregnancy in first trimester; Supervision of high-risk pregnancy; History of preterm delivery, currently pregnant; and HSV (herpes simplex virus) anogenital infection on her problem list.  Patient reports vaginal itching.  Contractions: Irregular. Vag. Bleeding: None.  Movement: Present. Denies leaking of fluid.   The following portions of the patient's history were reviewed and updated as appropriate: allergies, current medications, past family history, past medical history, past social history, past surgical history and problem list. Problem list updated.  Objective:   Vitals:   08/11/16 0925  BP: (!) 130/58  Pulse: 73  Weight: 179 lb 12.8 oz (81.6 kg)    Fetal Status: Fetal Heart Rate (bpm): 143   Movement: Present     General:  Alert, oriented and cooperative. Patient is in no acute distress.  Skin: Skin is warm and dry. No rash noted.   Cardiovascular: Normal heart rate noted  Respiratory: Normal respiratory effort, no problems with respiration noted  Abdomen: Soft, gravid, appropriate for gestational age. Pain/Pressure: Present     Pelvic:  Cervical exam performed        Extremities: Normal range of motion.  Edema: Trace  Mental Status: Normal mood and affect. Normal behavior. Normal judgment and thought content.   Assessment and Plan:  Pregnancy: [id number] at [redacted]w[redacted]d  1. Supervision of high risk pregnancy in second trimester Pt has vaginal itching--history of yest and chlamydia - Cervicovaginal ancillary only (GC/Chlam/Yeast/BV/trich) - pt requesting rpt diflucan  2. History of preterm delivery, currently pregnant Cervix closed.  3.  Tdap today, needs 2 hour but I snot fasting.   Will do at next visit.     Preterm labor symptoms and general obstetric precautions including but not limited to vaginal bleeding, contractions, leaking of fluid and fetal movement were reviewed in detail with the patient. Please refer to After Visit Summary for other counseling recommendations.  Return in about 2 weeks (around 08/25/2016).   Lesly Dukes, MD

## 2016-08-11 NOTE — Progress Notes (Signed)
Patient refused the TDap and the Flu shot.

## 2016-08-11 NOTE — Progress Notes (Signed)
Patient refuse to do T-dap and the Flu shot.

## 2016-08-12 LAB — CERVICOVAGINAL ANCILLARY ONLY
Bacterial vaginitis: NEGATIVE
CHLAMYDIA, DNA PROBE: NEGATIVE
Candida vaginitis: NEGATIVE
Neisseria Gonorrhea: NEGATIVE

## 2016-08-14 ENCOUNTER — Encounter: Payer: Self-pay | Admitting: General Practice

## 2016-08-25 ENCOUNTER — Ambulatory Visit (INDEPENDENT_AMBULATORY_CARE_PROVIDER_SITE_OTHER): Payer: Medicaid Other | Admitting: Family Medicine

## 2016-08-25 VITALS — BP 131/68 | HR 91 | Wt 178.2 lb

## 2016-08-25 DIAGNOSIS — O98811 Other maternal infectious and parasitic diseases complicating pregnancy, first trimester: Secondary | ICD-10-CM

## 2016-08-25 DIAGNOSIS — A749 Chlamydial infection, unspecified: Secondary | ICD-10-CM

## 2016-08-25 DIAGNOSIS — A609 Anogenital herpesviral infection, unspecified: Secondary | ICD-10-CM

## 2016-08-25 DIAGNOSIS — Z2839 Other underimmunization status: Secondary | ICD-10-CM

## 2016-08-25 DIAGNOSIS — Z283 Underimmunization status: Secondary | ICD-10-CM

## 2016-08-25 DIAGNOSIS — O0993 Supervision of high risk pregnancy, unspecified, third trimester: Secondary | ICD-10-CM

## 2016-08-25 DIAGNOSIS — Z8759 Personal history of other complications of pregnancy, childbirth and the puerperium: Secondary | ICD-10-CM

## 2016-08-25 DIAGNOSIS — O09219 Supervision of pregnancy with history of pre-term labor, unspecified trimester: Secondary | ICD-10-CM

## 2016-08-25 DIAGNOSIS — O09899 Supervision of other high risk pregnancies, unspecified trimester: Secondary | ICD-10-CM

## 2016-08-25 NOTE — Patient Instructions (Signed)
Preventing Preterm Birth °Preterm birth is when your baby is delivered between 20 weeks and 37 weeks of pregnancy. A full-term pregnancy lasts for at least 37 weeks. Preterm birth can be dangerous for your baby because the last few weeks of pregnancy are an important time for your baby's brain and lungs to grow. Many things can cause a baby to be born early. Sometimes the cause is not known. There are certain factors that make you more likely to experience preterm birth, such as: °· Having a previous baby born preterm. °· Being pregnant with twins or other multiples. °· Having had fertility treatment. °· Being overweight or underweight at the start of your pregnancy. °· Having any of the following during pregnancy: °¨ An infection, including a urinary tract infection (UTI) or an STI (sexually transmitted infection). °¨ High blood pressure. °¨ Diabetes. °¨ Vaginal bleeding. °· Being age 35 or older. °· Being age 18 or younger. °· Getting pregnant within 6 months of a previous pregnancy. °· Suffering extreme stress or physical or emotional abuse during pregnancy. °· Standing for long periods of time during pregnancy, such as working at a job that requires standing. °What are the risks? °The most serious risk of preterm birth is that the baby may not survive. This is more likely to happen if a baby is born before 34 weeks. Other risks and complications of preterm birth may include your baby having: °· Breathing problems. °· Brain damage that affects movement and coordination (cerebral palsy). °· Feeding difficulties. °· Vision or hearing problems. °· Infections or inflammation of the digestive tract (colitis). °· Developmental delays. °· Learning disabilities. °· Higher risk for diabetes, heart disease, and high blood pressure later in life. °What can I do to lower my risk? °Medical care °The most important thing you can do to lower your risk for preterm birth is to get routine medical care during pregnancy (prenatal  care). If you have a high risk of preterm birth, you may be referred to a health care provider who specializes in managing high-risk pregnancies (perinatologist). You may be given medicine to help prevent preterm birth. °Lifestyle changes °Certain lifestyle changes can also lower your risk of preterm birth: °· Wait at least 6 months after a pregnancy to become pregnant again. °· Try to plan pregnancy for when you are between 19 and 35 years old. °· Get to a healthy weight before getting pregnant. If you are overweight, work with your health care provider to safely lose weight. °· Do not use any products that contain nicotine or tobacco, such as cigarettes and e-cigarettes. If you need help quitting, ask your health care provider. °· Do not drink alcohol. °· Do not use drugs. °Where to find support: °For more support, consider: °· Talking with your health care provider. °· Talking with a therapist or substance abuse counselor, if you need help quitting. °· Working with a diet and nutrition specialist (dietitian) or a personal trainer to maintain a healthy weight. °· Joining a support group. °Where to find more information: °Learn more about preventing preterm birth from: °· Centers for Disease Control and Prevention: cdc.gov/reproductivehealth/maternalinfanthealth/pretermbirth.htm °· March of Dimes: marchofdimes.org/complications/premature-babies.aspx °· American Pregnancy Association: americanpregnancy.org/labor-and-birth/premature-labor °Contact a health care provider if: °· You have any of the following signs of preterm labor before 37 weeks: °¨ A change or increase in vaginal discharge. °¨ Fluid leaking from your vagina. °¨ Pressure or cramps in your lower abdomen. °¨ A backache that does not go away or gets worse. °¨   Regular tightening (contractions) in your lower abdomen. °Summary °· Preterm birth means having your baby during weeks 20-37 of pregnancy. °· Preterm birth may put your baby at risk for physical and  mental problems. °· Getting good prenatal care can help prevent preterm birth. °· You can lower your risk of preterm birth by making certain lifestyle changes, such as not smoking and not using alcohol. °This information is not intended to replace advice given to you by your health care provider. Make sure you discuss any questions you have with your health care provider. °Document Released: 05/22/2015 Document Revised: 12/15/2015 Document Reviewed: 12/15/2015 °Elsevier Interactive Patient Education © 2017 [employer] ° °

## 2016-08-25 NOTE — Addendum Note (Signed)
Addended by: Clearnce SorrelPICKARD, JILL S on: 08/25/2016 10:02 AM   Modules accepted: Orders

## 2016-08-25 NOTE — Progress Notes (Signed)
      Subjective:  Tara Sanchez is a 26 y.o. [id number] at 1122w1d being seen today for ongoing prenatal care.  She is currently monitored for the following issues for this high-risk pregnancy and has H/O 2 abortions; Maternal varicella, non-immune; Chlamydia infection complicating pregnancy in first trimester; Supervision of high-risk pregnancy; History of preterm delivery, currently pregnant; and HSV (herpes simplex virus) anogenital infection on her problem list.  Patient reports irregular contractions. Contractions are like CSX CorporationBraxton Hicks, not persistent or last a while. She also mentioned she fell last week at work, did not come in to be evaluated. She said she hit her head on the concrete floor, and possibly her side, btu denies any VB or painful contractions after fall. Educated on importance of coming to be evaluated for the baby after a fall. She states next time she will. Contractions: Irregular. Vag. Bleeding: None.  Movement: Present. Denies leaking of fluid.   The following portions of the patient's history were reviewed and updated as appropriate: allergies, current medications, past family history, past medical history, past social history, past surgical history and problem list. Problem list updated.  Objective:   Vitals:   08/25/16 0758  BP: 131/68  Pulse: 91  Weight: 178 lb 3.2 oz (80.8 kg)    Fetal Status: Fetal Heart Rate (bpm): 138 Fundal Height: 30 cm Movement: Present      General:  Alert, oriented and cooperative. Patient is in no acute distress.  Skin: Skin is warm and dry. No rash noted.   Cardiovascular: Normal heart rate noted  Respiratory: Normal respiratory effort, no problems with respiration noted  Abdomen: Soft, gravid, appropriate for gestational age. Pain/Pressure: Present     Pelvic:  Cervical exam deferred        Extremities: Normal range of motion.  Edema: None  Mental Status: Normal mood and affect. Normal behavior. Normal judgment and thought content.    Urinalysis:      Assessment and Plan:  Pregnancy: [id number] at 122w1d  1. Supervision of high risk pregnancy in third trimester - Performing a 1 hr GTT today due to not fasting and unable to stay for 2 hours because she needs to go to work. If her 1 hr is positive, she said she does not work Wednesday and therefore can come in then for 3 hr GTT.  2. Chlamydia infection complicating pregnancy in first trimester - Not sexually active, last test neg, next test at 36 weeks  3. History of preterm delivery, currently pregnant - h/o 7031 weeker and 37 weeker - Unable to do Makena  4. HSV (herpes simplex virus) anogenital infection - prophylaxis at 36 weeks - She states she does not think she has it, she had a vaginal "bump" last pregnancy and was given medication for it, but never had a culture performed, and has never had another outbreak.   5. Maternal varicella, non-immune  6. H/O 2 abortions   MOC: BTL likely vs LARC; BTL papers signed MOF: Pumping and bottle feeding Preterm labor symptoms and general obstetric precautions including but not limited to vaginal bleeding, contractions, leaking of fluid and fetal movement were reviewed in detail with the patient. Please refer to After Visit Summary for other counseling recommendations.  Return in about 2 weeks (around 09/08/2016) for Routine OB visit.   Jen MowElizabeth Mumaw, DO [facility]

## 2016-08-26 LAB — CBC
Hematocrit: 39.5 % (ref 34.0–46.6)
Hemoglobin: 12.8 g/dL (ref 11.1–15.9)
MCH: 30.4 pg (ref 26.6–33.0)
MCHC: 32.4 g/dL (ref 31.5–35.7)
MCV: 94 fL (ref 79–97)
PLATELETS: 255 10*3/uL (ref 150–379)
RBC: 4.21 x10E6/uL (ref 3.77–5.28)
RDW: 14 % (ref 12.3–15.4)
WBC: 9.9 10*3/uL (ref 3.4–10.8)

## 2016-08-26 LAB — GLUCOSE TOLERANCE, 1 HOUR: Glucose, 1Hr PP: 48 mg/dL — ABNORMAL LOW (ref 65–199)

## 2016-08-26 LAB — RPR: RPR: NONREACTIVE

## 2016-08-28 ENCOUNTER — Encounter (HOSPITAL_COMMUNITY): Payer: Self-pay | Admitting: *Deleted

## 2016-08-28 ENCOUNTER — Inpatient Hospital Stay (HOSPITAL_COMMUNITY)
Admission: AD | Admit: 2016-08-28 | Discharge: 2016-08-28 | Disposition: A | Discharge status: Home / Self Care | Payer: Medicaid Other | Source: Ambulatory Visit | Attending: Obstetrics and Gynecology | Admitting: Obstetrics and Gynecology

## 2016-08-28 DIAGNOSIS — Z833 Family history of diabetes mellitus: Secondary | ICD-10-CM | POA: Diagnosis not present

## 2016-08-28 DIAGNOSIS — N898 Other specified noninflammatory disorders of vagina: Secondary | ICD-10-CM

## 2016-08-28 DIAGNOSIS — K625 Hemorrhage of anus and rectum: Secondary | ICD-10-CM | POA: Diagnosis not present

## 2016-08-28 DIAGNOSIS — Z3A31 31 weeks gestation of pregnancy: Secondary | ICD-10-CM | POA: Insufficient documentation

## 2016-08-28 DIAGNOSIS — O26893 Other specified pregnancy related conditions, third trimester: Secondary | ICD-10-CM

## 2016-08-28 DIAGNOSIS — O09219 Supervision of pregnancy with history of pre-term labor, unspecified trimester: Secondary | ICD-10-CM

## 2016-08-28 DIAGNOSIS — Z888 Allergy status to other drugs, medicaments and biological substances status: Secondary | ICD-10-CM | POA: Insufficient documentation

## 2016-08-28 DIAGNOSIS — O09213 Supervision of pregnancy with history of pre-term labor, third trimester: Secondary | ICD-10-CM

## 2016-08-28 DIAGNOSIS — Z79899 Other long term (current) drug therapy: Secondary | ICD-10-CM | POA: Insufficient documentation

## 2016-08-28 DIAGNOSIS — O09899 Supervision of other high risk pregnancies, unspecified trimester: Secondary | ICD-10-CM

## 2016-08-28 DIAGNOSIS — Z87891 Personal history of nicotine dependence: Secondary | ICD-10-CM | POA: Insufficient documentation

## 2016-08-28 DIAGNOSIS — R42 Dizziness and giddiness: Secondary | ICD-10-CM | POA: Insufficient documentation

## 2016-08-28 LAB — URINALYSIS, ROUTINE W REFLEX MICROSCOPIC
Bilirubin Urine: NEGATIVE
GLUCOSE, UA: NEGATIVE mg/dL
Hgb urine dipstick: NEGATIVE
KETONES UR: NEGATIVE mg/dL
Nitrite: NEGATIVE
PH: 5 (ref 5.0–8.0)
Protein, ur: NEGATIVE mg/dL
SPECIFIC GRAVITY, URINE: 1.028 (ref 1.005–1.030)

## 2016-08-28 MED ORDER — HYDROCORTISONE ACE-PRAMOXINE 1-1 % RE FOAM: 1.0000 | Freq: Two times a day (BID) | RECTAL | 0 refills | Status: DC

## 2016-08-28 NOTE — Discharge Instructions (Signed)
Lower Gastrointestinal Bleeding °Lower gastrointestinal (GI) bleeding is the result of bleeding from the colon, rectum, or anal area. The colon is the last part of the digestive tract, where stool, also called feces, is formed. If you have lower GI bleeding, you may see blood in or on your stool. It may be bright red. °Lower GI bleeding often stops without treatment. Continued or heavy bleeding needs emergency treatment at the hospital. °What are the causes? °Lower GI bleeding may be caused by: °· A condition that causes pouches to form in the colon over time (diverticulosis). °· Swelling and irritation (inflammation) in areas with diverticulosis (diverticulitis). °· Inflammation of the colon (inflammatory bowel disease). °· Swollen veins in the rectum (hemorrhoids). °· Painful tears in the anus (anal fissures), often caused by passing hard stools. °· Cancer of the colon or rectum. °· Noncancerous growths (polyps) of the colon or rectum. °· A bleeding disorder that impairs the formation of blood clots and causes easy bleeding (coagulopathy). °· An abnormal weakening of a blood vessel where an artery and a vein come together (arteriovenous malformation). °What increases the risk? °You are more likely to develop this condition if: °· You are older than 24 years of age. °· You take aspirin or NSAIDs on a regular basis. °· You take anticoagulant or antiplatelet drugs. °· You have a history of high-dose X-ray treatment (radiation therapy) of the colon. °· You recently had a colon polyp removed. °What are the signs or symptoms? °Symptoms of this condition include: °· Bright red blood or blood clots coming from your rectum. °· Bloody stools. °· Black or maroon-colored stools. °· Pain or cramping in the abdomen. °· Weakness or dizziness. °· Racing heartbeat. °How is this diagnosed? °This condition may be diagnosed based on: °· Your symptoms and medical history. °· A physical exam. During the exam, your health care provider  will check for signs of blood loss, such as low blood pressure and a rapid pulse. °· Tests, such as: °¨ Flexible sigmoidoscopy. In this procedure, a flexible tube with a camera on the end is used to examine your anus and the first part of your colon to look for the source of bleeding. °¨ Colonoscopy. This is similar to a flexible sigmoidoscopy, but the camera can extend all the way to the uppermost part of your colon. °¨ Blood tests to measure your red blood cell count and to check for coagulopathy. °¨ An imaging study of your colon to look for a bleeding site. In some cases, you may have X-rays taken after a dye or radioactive substance is injected into your bloodstream (angiogram). °How is this treated? °Treatment for this condition depends on the cause of the bleeding. Heavy or persistent bleeding is treated at the hospital. Treatment may include: °· Getting fluids through an IV tube inserted into one of your veins. °· Getting blood through an IV tube (blood transfusion). °· Stopping bleeding through high-heat coagulation, injections of certain medicines, or applying surgical clips. This can all be done during a colonoscopy. °· Having a procedure that involves first doing an angiogram and then blocking blood flow to the bleeding site (embolization). °· Stopping some of your regular medicines for a certain amount of time. °· Having surgery to remove part of the colon. This may be needed if bleeding is severe and does not respond to other treatment. °Follow these instructions at home: °· Take over-the-counter and prescription medicines only as told by your health care provider. You may need to avoid   aspirin, NSAIDs, or other medicines that increase bleeding.  Eat foods that are high in fiber. This will help keep your stools soft. These foods include whole grains, legumes, fruits, and vegetables. Eating 1-3 prunes each day works well for many people.  Drink enough fluid to keep your urine clear or pale  yellow.  Keep all follow-up visits as told by your health care provider. This is important. Contact a health care provider if:  Your symptoms do not improve. Get help right away if:  Your bleeding increases.  You feel light-headed or you faint.  You feel weak.  You have severe cramps in your back or abdomen.  You pass large blood clots in your stool.  Your symptoms get worse. This information is not intended to replace advice given to you by your health care provider. Make sure you discuss any questions you have with your health care provider. Document Released: 08/23/2015 Document Revised: 09/13/2015 Document Reviewed: 08/23/2015 Elsevier Interactive Patient Education  2017 [employer] Third Trimester of Pregnancy The third trimester is from week 28 through week 40 (months 7 through 9). The third trimester is a time when the unborn baby (fetus) is growing rapidly. At the end of the ninth month, the fetus is about 20 inches in length and weighs 6-10 pounds. Body changes during your third trimester Your body will continue to go through many changes during pregnancy. The changes vary from woman to woman. During the third trimester:  Your weight will continue to increase. You can expect to gain 25-35 pounds (11-16 kg) by the end of the pregnancy.  You may begin to get stretch marks on your hips, abdomen, and breasts.  You may urinate more often because the fetus is moving lower into your pelvis and pressing on your bladder.  You may develop or continue to have heartburn. This is caused by increased hormones that slow down muscles in the digestive tract.  You may develop or continue to have constipation because increased hormones slow digestion and cause the muscles that push waste through your intestines to relax.  You may develop hemorrhoids. These are swollen veins (varicose veins) in the rectum that can itch or be painful.  You may develop swollen, bulging veins (varicose  veins) in your legs.  You may have increased body aches in the pelvis, back, or thighs. This is due to weight gain and increased hormones that are relaxing your joints.  You may have changes in your hair. These can include thickening of your hair, rapid growth, and changes in texture. Some women also have hair loss during or after pregnancy, or hair that feels dry or thin. Your hair will most likely return to normal after your baby is born.  Your breasts will continue to grow and they will continue to become tender. A yellow fluid (colostrum) may leak from your breasts. This is the first milk you are producing for your baby.  Your belly button may stick out.  You may notice more swelling in your hands, face, or ankles.  You may have increased tingling or numbness in your hands, arms, and legs. The skin on your belly may also feel numb.  You may feel short of breath because of your expanding uterus.  You may have more problems sleeping. This can be caused by the size of your belly, increased need to urinate, and an increase in your body's metabolism.  You may notice the fetus "dropping," or moving lower in your abdomen (lightening).  You may  have increased vaginal discharge.  You may notice your joints feel loose and you may have pain around your pelvic bone. What to expect at prenatal visits You will have prenatal exams every 2 weeks until week 36. Then you will have weekly prenatal exams. During a routine prenatal visit:  You will be weighed to make sure you and the baby are growing normally.  Your blood pressure will be taken.  Your abdomen will be measured to track your baby's growth.  The fetal heartbeat will be listened to.  Any test results from the previous visit will be discussed.  You may have a cervical check near your due date to see if your cervix has softened or thinned (effaced).  You will be tested for Group B streptococcus. This happens between 35 and 37  weeks. Your health care provider may ask you:  What your birth plan is.  How you are feeling.  If you are feeling the baby move.  If you have had any abnormal symptoms, such as leaking fluid, bleeding, severe headaches, or abdominal cramping.  If you are using any tobacco products, including cigarettes, chewing tobacco, and electronic cigarettes.  If you have any questions. Other tests or screenings that may be performed during your third trimester include:  Blood tests that check for low iron levels (anemia).  Fetal testing to check the health, activity level, and growth of the fetus. Testing is done if you have certain medical conditions or if there are problems during the pregnancy.  Nonstress test (NST). This test checks the health of your baby to make sure there are no signs of problems, such as the baby not getting enough oxygen. During this test, a belt is placed around your belly. The baby is made to move, and its heart rate is monitored during movement. What is false labor? False labor is a condition in which you feel small, irregular tightenings of the muscles in the womb (contractions) that usually go away with rest, changing position, or drinking water. These are called Braxton Hicks contractions. Contractions may last for hours, days, or even weeks before true labor sets in. If contractions come at regular intervals, become more frequent, increase in intensity, or become painful, you should see your health care provider. What are the signs of labor?  Abdominal cramps.  Regular contractions that start at 10 minutes apart and become stronger and more frequent with time.  Contractions that start on the top of the uterus and spread down to the lower abdomen and back.  Increased pelvic pressure and dull back pain.  A watery or bloody mucus discharge that comes from the vagina.  Leaking of amniotic fluid. This is also known as your "water breaking." It could be a slow  trickle or a gush. Let your health care provider know if it has a color or strange odor. If you have any of these signs, call your health care provider right away, even if it is before your due date. Follow these instructions at home: Medicines   Follow your health care provider's instructions regarding medicine use. Specific medicines may be either safe or unsafe to take during pregnancy.  Take a prenatal vitamin that contains at least 600 micrograms (mcg) of folic acid.  If you develop constipation, try taking a stool softener if your health care provider approves. Eating and drinking   Eat a balanced diet that includes fresh fruits and vegetables, whole grains, good sources of protein such as meat, eggs, or tofu, and  low-fat dairy. Your health care provider will help you determine the amount of weight gain that is right for you.  Avoid raw meat and uncooked cheese. These carry germs that can cause birth defects in the baby.  If you have low calcium intake from food, talk to your health care provider about whether you should take a daily calcium supplement.  Eat four or five small meals rather than three large meals a day.  Limit foods that are high in fat and processed sugars, such as fried and sweet foods.  To prevent constipation:  Drink enough fluid to keep your urine clear or pale yellow.  Eat foods that are high in fiber, such as fresh fruits and vegetables, whole grains, and beans. Activity   Exercise only as directed by your health care provider. Most women can continue their usual exercise routine during pregnancy. Try to exercise for 30 minutes at least 5 days a week. Stop exercising if you experience uterine contractions.  Avoid heavy lifting.  Do not exercise in extreme heat or humidity, or at high altitudes.  Wear low-heel, comfortable shoes.  Practice good posture.  You may continue to have sex unless your health care provider tells you otherwise. Relieving  pain and discomfort   Take frequent breaks and rest with your legs elevated if you have leg cramps or low back pain.  Take warm sitz baths to soothe any pain or discomfort caused by hemorrhoids. Use hemorrhoid cream if your health care provider approves.  Wear a good support bra to prevent discomfort from breast tenderness.  If you develop varicose veins:  Wear support pantyhose or compression stockings as told by your healthcare provider.  Elevate your feet for 15 minutes, 3-4 times a day. Prenatal care   Write down your questions. Take them to your prenatal visits.  Keep all your prenatal visits as told by your health care provider. This is important. Safety   Wear your seat belt at all times when driving.  Make a list of emergency phone numbers, including numbers for family, friends, the hospital, and police and fire departments. General instructions   Avoid cat litter boxes and soil used by cats. These carry germs that can cause birth defects in the baby. If you have a cat, ask someone to clean the litter box for you.  Do not travel far distances unless it is absolutely necessary and only with the approval of your health care provider.  Do not use hot tubs, steam rooms, or saunas.  Do not drink alcohol.  Do not use any products that contain nicotine or tobacco, such as cigarettes and e-cigarettes. If you need help quitting, ask your health care provider.  Do not use any medicinal herbs or unprescribed drugs. These chemicals affect the formation and growth of the baby.  Do not douche or use tampons or scented sanitary pads.  Do not cross your legs for long periods of time.  To prepare for the arrival of your baby:  Take prenatal classes to understand, practice, and ask questions about labor and delivery.  Make a trial run to the hospital.  Visit the hospital and tour the maternity area.  Arrange for maternity or paternity leave through employers.  Arrange for  family and friends to take care of pets while you are in the hospital.  Purchase a rear-facing car seat and make sure you know how to install it in your car.  Pack your hospital bag.  Prepare the babys nursery. Make sure  to remove all pillows and stuffed animals from the baby's crib to prevent suffocation.  Visit your dentist if you have not gone during your pregnancy. Use a soft toothbrush to brush your teeth and be gentle when you floss. Contact a health care provider if:  You are unsure if you are in labor or if your water has broken.  You become dizzy.  You have mild pelvic cramps, pelvic pressure, or nagging pain in your abdominal area.  You have lower back pain.  You have persistent nausea, vomiting, or diarrhea.  You have an unusual or bad smelling vaginal discharge.  You have pain when you urinate. Get help right away if:  Your water breaks before 37 weeks.  You have regular contractions less than 5 minutes apart before 37 weeks.  You have a fever.  You are leaking fluid from your vagina.  You have spotting or bleeding from your vagina.  You have severe abdominal pain or cramping.  You have rapid weight loss or weight gain.  You have shortness of breath with chest pain.  You notice sudden or extreme swelling of your face, hands, ankles, feet, or legs.  Your baby makes fewer than 10 movements in 2 hours.  You have severe headaches that do not go away when you take medicine.  You have vision changes. Summary  The third trimester is from week 28 through week 40, months 7 through 9. The third trimester is a time when the unborn baby (fetus) is growing rapidly.  During the third trimester, your discomfort may increase as you and your baby continue to gain weight. You may have abdominal, leg, and back pain, sleeping problems, and an increased need to urinate.  During the third trimester your breasts will keep growing and they will continue to become tender. A  yellow fluid (colostrum) may leak from your breasts. This is the first milk you are producing for your baby.  False labor is a condition in which you feel small, irregular tightenings of the muscles in the womb (contractions) that eventually go away. These are called Braxton Hicks contractions. Contractions may last for hours, days, or even weeks before true labor sets in.  Signs of labor can include: abdominal cramps; regular contractions that start at 10 minutes apart and become stronger and more frequent with time; watery or bloody mucus discharge that comes from the vagina; increased pelvic pressure and dull back pain; and leaking of amniotic fluid. This information is not intended to replace advice given to you by your health care provider. Make sure you discuss any questions you have with your health care provider. Document Released: 04/01/2001 Document Revised: 09/13/2015 Document Reviewed: 06/08/2012 Elsevier Interactive Patient Education  2017 [employer].

## 2016-08-28 NOTE — MAU Note (Signed)
Pt reports she was at work and she started passing a lot of mucous and the people at work made her come to the hospital. Pt also states she thinks she is dehydrated because she feels dizzy.

## 2016-08-28 NOTE — MAU Provider Note (Signed)
Chief Complaint:  No chief complaint on file.   First Provider Initiated Contact with Patient 08/28/16 1535     HPI: Tara Sanchez is a 26 y.o. [id number] at 70w4dwho presents to maternity admissions reporting passing a lot of mucous. States it had red blood in it.  States her coworkers insisted she come in.  Also states she felt a little dizzy earlier, not bad now.  States drinks a lot. . She reports good fetal movement, denies LOF, vaginal itching/burning, urinary symptoms, h/a, dizziness, n/v, diarrhea, constipation or fever/chills.    Vaginal Discharge  The patient's primary symptoms include vaginal bleeding (not sure if vaginal or rectal) and vaginal discharge. The patient's pertinent negatives include no genital itching, genital lesions, genital odor or pelvic pain. This is a new problem. The current episode started today. The problem occurs constantly. The problem has been unchanged. The patient is experiencing no pain. She is pregnant. Pertinent negatives include no abdominal pain, back pain, constipation, diarrhea, dysuria, fever, headaches, nausea or vomiting. The vaginal discharge was mucoid, thick and white. The vaginal bleeding is spotting. She has not been passing clots. She has not been passing tissue. Nothing aggravates the symptoms. She has tried nothing for the symptoms.    RN Note: Pt reports she was at work and she started passing a lot of mucous and the people at work made her come to the hospital. Pt also states she thinks she is dehydrated because she feels dizzy.   Past Medical History: Past Medical History:  Diagnosis Date  . Genital herpes   . Headache   . Infection    UTI    Past obstetric history: OB History  Gravida Para Term Preterm AB Living  5 2 0 2 2 2   SAB TAB Ectopic Multiple Live Births    2   0 2    # Outcome Date GA Lbr Len/2nd Weight Sex Delivery Anes PTL Lv  5 Current           4 Preterm 04/26/15 [redacted]w[redacted]d 07:19 / 00:14 5 lb 6.8 oz (2.46 kg) M  Vag-Spont EPI  LIV  3 Preterm 04/20/10 [redacted]w[redacted]d   F Vag-Spont EPI  LIV  2 TAB 2010          1 TAB 2009              Past Surgical History: Past Surgical History:  Procedure Laterality Date  . THERAPEUTIC ABORTION      Family History: Family History  Problem Relation Age of Onset  . Diabetes Paternal Grandmother   . Asthma Neg Hx   . Cancer Neg Hx   . Heart disease Neg Hx   . Hypertension Neg Hx   . Stroke Neg Hx   . Alcohol abuse Neg Hx   . Arthritis Neg Hx   . Birth defects Neg Hx   . COPD Neg Hx   . Depression Neg Hx   . Drug abuse Neg Hx   . Early death Neg Hx   . Hearing loss Neg Hx   . Hyperlipidemia Neg Hx   . Kidney disease Neg Hx   . Learning disabilities Neg Hx   . Mental illness Neg Hx   . Mental retardation Neg Hx   . Miscarriages / Stillbirths Neg Hx   . Vision loss Neg Hx   . Varicose Veins Neg Hx     Social History: Social History  Substance Use Topics  . Smoking status: Former Smoker  Years: 2.00    Types: Cigars  . Smokeless tobacco: Former Neurosurgeon  . Alcohol use Yes     Comment: Social    Allergies:  Allergies  Allergen Reactions  . Macrobid [Nitrofurantoin] Shortness Of Breath and Swelling  . Phenergan [Promethazine Hcl] Other (See Comments)    Reaction:  Severe sedation  . Tessalon [Benzonatate] Other (See Comments)    Reaction:  Severe sedation    Meds:  Prescriptions Prior to Admission  Medication Sig Dispense Refill Last Dose  . metroNIDAZOLE (FLAGYL) 500 MG tablet TK 1 T PO  BID  0 Not Taking  . promethazine (PHENERGAN) 25 MG tablet Take 0.5-1 tablets (12.5-25 mg total) by mouth every 6 (six) hours as needed. (Patient not taking: Reported on 08/11/2016) 30 tablet 0 Not Taking  . valACYclovir (VALTREX) 1000 MG tablet Take 1 tablet (1,000 mg total) by mouth daily. (Patient not taking: Reported on 08/11/2016) 5 tablet 5 Not Taking    I have reviewed patient's Past Medical Hx, Surgical Hx, Family Hx, Social Hx, medications and  allergies.   ROS:  Review of Systems  Constitutional: Negative for fever.  Gastrointestinal: Negative for abdominal pain, constipation, diarrhea, nausea and vomiting.  Genitourinary: Positive for vaginal discharge. Negative for dysuria and pelvic pain.  Musculoskeletal: Negative for back pain.  Neurological: Negative for headaches.   Other systems negative  Physical Exam  Patient Vitals for the past 24 hrs:  BP Temp Temp src Pulse Resp SpO2  08/28/16 1526 118/60 98.4 F (36.9 C) Oral 84 17 99 %   Constitutional: Well-developed, well-nourished female in no acute distress.  Cardiovascular: normal rate and rhythm Respiratory: normal effort, clear to auscultation bilaterally GI: Abd soft, non-tender, gravid appropriate for gestational age.   No rebound or guarding. MS: Extremities nontender, no edema, normal ROM Neurologic: Alert and oriented x 4.  GU: Neg CVAT.  PELVIC EXAM: Cervix pink, visually closed, without lesion, moderate amount white mucous discharge, vaginal walls and external genitalia normal    NO SIGN OF BLOOD IN VAGINA  Bimanual exam: Cervix firm, posterior, neg CMT, uterus nontender, Fundal Height consistent with dates, adnexa without tenderness, enlargement, or mass  Dilation: Closed Effacement (%): Thick Station: Ballotable Exam by:: Tara Sanchez,CNM  FHT:  Baseline 140 , moderate variability, accelerations present, no decelerations Contractions:  Rare   Labs: B/Positive/-- (02/26 1633) Results for orders placed or performed during the hospital encounter of 08/28/16 (from the past 24 hour(s))  Urinalysis, Routine w reflex microscopic     Status: Abnormal   Collection Time: 08/28/16  3:10 PM  Result Value Ref Range   Color, Urine YELLOW YELLOW   APPearance HAZY (A) CLEAR   Specific Gravity, Urine 1.028 1.005 - 1.030   pH 5.0 5.0 - 8.0   Glucose, UA NEGATIVE NEGATIVE mg/dL   Hgb urine dipstick NEGATIVE NEGATIVE   Bilirubin Urine NEGATIVE NEGATIVE   Ketones, ur  NEGATIVE NEGATIVE mg/dL   Protein, ur NEGATIVE NEGATIVE mg/dL   Nitrite NEGATIVE NEGATIVE   Leukocytes, UA MODERATE (A) NEGATIVE   RBC / HPF 0-5 0 - 5 RBC/hpf   WBC, UA 6-30 0 - 5 WBC/hpf   Bacteria, UA RARE (A) NONE SEEN   Squamous Epithelial / LPF 6-30 (A) NONE SEEN   Mucous PRESENT     Imaging:  No results found.  MAU Course/MDM: I have ordered labs and reviewed results. No ketones.  Able to drink without nausea.  NST reviewed and found to be reactive Category I Discussed blood  tinged mucous may have been rectal. Does admit to having a BM prior to seeing blood streaked mucous. No blood seen in vault. Not even pink or brown. Will treat for possible internal hemorrhoid.   Assessment: Single IUP at [id number]  Vaginal discharge during pregnancy in third trimester - Plan: Discharge patient  History of preterm delivery, currently pregnant  Rectal bleeding - Plan: Discharge patient   Plan: Discharge home Rx [facility] for possible hemorrhoid Recommend push PO fluids Bleeding precautions. When in doubt, come in if sees blood again Preterm Labor precautions and fetal kick counts Follow up in Office for prenatal visits and recheck of status Encouraged to return here or to other Urgent Care/ED if she develops worsening of symptoms, increase in pain, fever, or other concerning symptoms.   Pt stable at time of discharge.  Wynelle BourgeoisMarie Manika Hast CNM, MSN Certified Nurse-Midwife 08/28/2016 3:47 PM

## 2016-09-08 ENCOUNTER — Ambulatory Visit (INDEPENDENT_AMBULATORY_CARE_PROVIDER_SITE_OTHER): Payer: Medicaid Other | Admitting: Obstetrics and Gynecology

## 2016-09-08 VITALS — BP 116/61 | HR 74 | Wt 181.6 lb

## 2016-09-08 DIAGNOSIS — O0993 Supervision of high risk pregnancy, unspecified, third trimester: Secondary | ICD-10-CM

## 2016-09-08 DIAGNOSIS — A609 Anogenital herpesviral infection, unspecified: Secondary | ICD-10-CM

## 2016-09-08 DIAGNOSIS — O09219 Supervision of pregnancy with history of pre-term labor, unspecified trimester: Secondary | ICD-10-CM

## 2016-09-08 DIAGNOSIS — O09213 Supervision of pregnancy with history of pre-term labor, third trimester: Secondary | ICD-10-CM

## 2016-09-08 DIAGNOSIS — O09899 Supervision of other high risk pregnancies, unspecified trimester: Secondary | ICD-10-CM

## 2016-09-08 NOTE — Progress Notes (Signed)
Prenatal Visit Note Date: 09/08/2016 Clinic: [facility]  Subjective:  Tara Sanchez is a 26 y.o. [id number] at 6264w1d being seen today for ongoing prenatal care.  She is currently monitored for the following issues for this high-risk pregnancy and has H/O 2 abortions; Maternal varicella, non-immune; Chlamydia infection complicating pregnancy in first trimester; Supervision of high-risk pregnancy; History of preterm delivery, currently pregnant; and HSV (herpes simplex virus) anogenital infection on her problem list.  Patient reports UCs yesterday that tapered off Contractions: Irregular. Vag. Bleeding: None.  Movement: Present. Denies leaking of fluid.   The following portions of the patient's history were reviewed and updated as appropriate: allergies, current medications, past family history, past medical history, past social history, past surgical history and problem list. Problem list updated.  Objective:   Vitals:   09/08/16 1017  BP: 116/61  Pulse: 74  Weight: 181 lb 9.6 oz (82.4 kg)    Fetal Status: Fetal Heart Rate (bpm): 135 Fundal Height: 33 cm Movement: Present  Presentation: Vertex  General:  Alert, oriented and cooperative. Patient is in no acute distress.  Skin: Skin is warm and dry. No rash noted.   Cardiovascular: Normal heart rate noted  Respiratory: Normal respiratory effort, no problems with respiration noted  Abdomen: Soft, gravid, appropriate for gestational age. Pain/Pressure: Present     Pelvic:  Cervical exam performed Dilation: Closed Effacement (%): Thick Station: Ballotable  1-2 externally but closed internally  Extremities: Normal range of motion.  Edema: None  Mental Status: Normal mood and affect. Normal behavior. Normal judgment and thought content.   Urinalysis:      Assessment and Plan:  Pregnancy: [id number] at 6864w1d  1. Supervision of high risk pregnancy in third trimester Routine care. gbs nv. Signed btl papers already  2.  HSV (herpes simplex virus) anogenital infection Start valtrex ppx nv  3. History of preterm delivery, currently pregnant Declined makena  Preterm labor symptoms and general obstetric precautions including but not limited to vaginal bleeding, contractions, leaking of fluid and fetal movement were reviewed in detail with the patient. Please refer to After Visit Summary for other counseling recommendations.  Return in about 2 weeks (around 09/22/2016) for rob.   [place] Bing[place], Laporche Martelle, MD

## 2016-09-23 ENCOUNTER — Ambulatory Visit (INDEPENDENT_AMBULATORY_CARE_PROVIDER_SITE_OTHER): Payer: Medicaid Other | Admitting: Obstetrics & Gynecology

## 2016-09-23 ENCOUNTER — Other Ambulatory Visit (HOSPITAL_COMMUNITY)
Admission: RE | Admit: 2016-09-23 | Discharge: 2016-09-23 | Disposition: A | Discharge status: Home / Self Care | Payer: Medicaid Other | Source: Ambulatory Visit | Attending: Obstetrics & Gynecology | Admitting: Obstetrics & Gynecology

## 2016-09-23 VITALS — BP 112/81 | HR 84 | Wt 186.0 lb

## 2016-09-23 DIAGNOSIS — O0993 Supervision of high risk pregnancy, unspecified, third trimester: Secondary | ICD-10-CM | POA: Insufficient documentation

## 2016-09-23 DIAGNOSIS — Z3A35 35 weeks gestation of pregnancy: Secondary | ICD-10-CM | POA: Diagnosis not present

## 2016-09-23 DIAGNOSIS — O0990 Supervision of high risk pregnancy, unspecified, unspecified trimester: Secondary | ICD-10-CM

## 2016-09-23 DIAGNOSIS — O09219 Supervision of pregnancy with history of pre-term labor, unspecified trimester: Secondary | ICD-10-CM

## 2016-09-23 DIAGNOSIS — O09213 Supervision of pregnancy with history of pre-term labor, third trimester: Secondary | ICD-10-CM

## 2016-09-23 DIAGNOSIS — O09899 Supervision of other high risk pregnancies, unspecified trimester: Secondary | ICD-10-CM

## 2016-09-23 MED ORDER — VALACYCLOVIR HCL 1 G PO TABS: 1000.0000 mg | ORAL_TABLET | Freq: Every day | ORAL | 1 refills | Status: DC

## 2016-09-23 NOTE — Progress Notes (Signed)
   PRENATAL VISIT NOTE  Subjective:  Tara Sanchez is a 26 y.o. [id number] at 5728w2d being seen today for ongoing prenatal care.  She is currently monitored for the following issues for this high-risk pregnancy and has H/O 2 abortions; Maternal varicella, non-immune; Chlamydia infection complicating pregnancy in first trimester; Supervision of high-risk pregnancy; History of preterm delivery, currently pregnant; and HSV (herpes simplex virus) anogenital infection on her problem list.  Patient reports occasional contractions.  Contractions: Irregular. Vag. Bleeding: None.  Movement: Present. Denies leaking of fluid.   The following portions of the patient's history were reviewed and updated as appropriate: allergies, current medications, past family history, past medical history, past social history, past surgical history and problem list. Problem list updated.  Objective:   Vitals:   09/23/16 1247  BP: 112/81  Pulse: 84  Weight: 84.4 kg (186 lb)    Fetal Status: Fetal Heart Rate (bpm): 140 Fundal Height: 35 cm Movement: Present  Presentation: Vertex  General:  Alert, oriented and cooperative. Patient is in no acute distress.  Skin: Skin is warm and dry. No rash noted.   Cardiovascular: Normal heart rate noted  Respiratory: Normal respiratory effort, no problems with respiration noted  Abdomen: Soft, gravid, appropriate for gestational age. Pain/Pressure: Present     Pelvic:  Cervical exam performed Dilation: 1 Effacement (%): 30 Station: -3  Extremities: Normal range of motion.  Edema: None  Mental Status: Normal mood and affect. Normal behavior. Normal judgment and thought content.   Assessment and Plan:  Pregnancy: [id number] at 5928w2d  1. Supervision of high risk pregnancy, antepartum Routine screening - Culture, beta strep (group b only) - Cervicovaginal ancillary only  2. History of preterm delivery, currently pregnant H/o HSV - valACYclovir (VALTREX) 1000 MG tablet; Take 1  tablet (1,000 mg total) by mouth daily.  Dispense: 30 tablet; Refill: 1  Preterm labor symptoms and general obstetric precautions including but not limited to vaginal bleeding, contractions, leaking of fluid and fetal movement were reviewed in detail with the patient. Please refer to After Visit Summary for other counseling recommendations.  Return in about 1 week (around 09/30/2016). Valtrex for ppx  Scheryl DarterJames Arnold, MD

## 2016-09-23 NOTE — Patient Instructions (Signed)

## 2016-09-25 LAB — CERVICOVAGINAL ANCILLARY ONLY
CHLAMYDIA, DNA PROBE: NEGATIVE
Neisseria Gonorrhea: NEGATIVE

## 2016-09-26 ENCOUNTER — Telehealth: Payer: Self-pay | Admitting: General Practice

## 2016-09-26 NOTE — Telephone Encounter (Signed)
Patient called and left message stating she is having an enormous amount of back pain that is just unreal. Patient is uncertain if she needs to come in or not. Called patient and she states she is having horrible back pain on her right side. Patient states she doesn't feel like this is labor because it didn't feel like this with her other children. Patient does report increase in urination but only a little comes out. Recommended she come in for UA to rule out UTI. Patient states she is on her way to work right now and cannot come in this morning. Recommended she drink plenty of water today to see if that helps, try tylenol or muscle rubs for pain as well otherwise she may go to [facility] if we are closed. Patient verbalized understanding and had no other questions

## 2016-09-27 LAB — CULTURE, BETA STREP (GROUP B ONLY): STREP GP B CULTURE: NEGATIVE

## 2016-09-29 ENCOUNTER — Encounter: Payer: Self-pay | Admitting: Obstetrics & Gynecology

## 2016-09-29 ENCOUNTER — Ambulatory Visit (INDEPENDENT_AMBULATORY_CARE_PROVIDER_SITE_OTHER): Payer: Medicaid Other | Admitting: Obstetrics & Gynecology

## 2016-09-29 VITALS — BP 129/66 | HR 97 | Wt 190.0 lb

## 2016-09-29 DIAGNOSIS — O0993 Supervision of high risk pregnancy, unspecified, third trimester: Secondary | ICD-10-CM

## 2016-09-29 DIAGNOSIS — O09219 Supervision of pregnancy with history of pre-term labor, unspecified trimester: Secondary | ICD-10-CM

## 2016-09-29 DIAGNOSIS — O09899 Supervision of other high risk pregnancies, unspecified trimester: Secondary | ICD-10-CM

## 2016-09-29 DIAGNOSIS — O09213 Supervision of pregnancy with history of pre-term labor, third trimester: Secondary | ICD-10-CM

## 2016-09-29 NOTE — Patient Instructions (Signed)
Preventing Preterm Birth °Preterm birth is when your baby is delivered between 20 weeks and 37 weeks of pregnancy. A full-term pregnancy lasts for at least 37 weeks. Preterm birth can be dangerous for your baby because the last few weeks of pregnancy are an important time for your baby's brain and lungs to grow. Many things can cause a baby to be born early. Sometimes the cause is not known. There are certain factors that make you more likely to experience preterm birth, such as: °· Having a previous baby born preterm. °· Being pregnant with twins or other multiples. °· Having had fertility treatment. °· Being overweight or underweight at the start of your pregnancy. °· Having any of the following during pregnancy: °? An infection, including a urinary tract infection (UTI) or an STI (sexually transmitted infection). °? High blood pressure. °? Diabetes. °? Vaginal bleeding. °· Being age 35 or older. °· Being age 18 or younger. °· Getting pregnant within 6 months of a previous pregnancy. °· Suffering extreme stress or physical or emotional abuse during pregnancy. °· Standing for long periods of time during pregnancy, such as working at a job that requires standing. ° °What are the risks? °The most serious risk of preterm birth is that the baby may not survive. This is more likely to happen if a baby is born before 34 weeks. Other risks and complications of preterm birth may include your baby having: °· Breathing problems. °· Brain damage that affects movement and coordination (cerebral palsy). °· Feeding difficulties. °· Vision or hearing problems. °· Infections or inflammation of the digestive tract (colitis). °· Developmental delays. °· Learning disabilities. °· Higher risk for diabetes, heart disease, and high blood pressure later in life. ° °What can I do to lower my risk? °Medical care ° °The most important thing you can do to lower your risk for preterm birth is to get routine medical care during pregnancy  (prenatal care). If you have a high risk of preterm birth, you may be referred to a health care provider who specializes in managing high-risk pregnancies (perinatologist). You may be given medicine to help prevent preterm birth. °Lifestyle changes °Certain lifestyle changes can also lower your risk of preterm birth: °· Wait at least 6 months after a pregnancy to become pregnant again. °· Try to plan pregnancy for when you are between 19 and 35 years old. °· Get to a healthy weight before getting pregnant. If you are overweight, work with your health care provider to safely lose weight. °· Do not use any products that contain nicotine or tobacco, such as cigarettes and e-cigarettes. If you need help quitting, ask your health care provider. °· Do not drink alcohol. °· Do not use drugs. ° °Where to find support: °For more support, consider: °· Talking with your health care provider. °· Talking with a therapist or substance abuse counselor, if you need help quitting. °· Working with a diet and nutrition specialist (dietitian) or a personal trainer to maintain a healthy weight. °· Joining a support group. ° °Where to find more information: °Learn more about preventing preterm birth from: °· Centers for Disease Control and Prevention: cdc.gov/reproductivehealth/maternalinfanthealth/pretermbirth.htm °· March of Dimes: marchofdimes.org/complications/premature-babies.aspx °· American Pregnancy Association: americanpregnancy.org/labor-and-birth/premature-labor ° °Contact a health care provider if: °· You have any of the following signs of preterm labor before 37 weeks: °? A change or increase in vaginal discharge. °? Fluid leaking from your vagina. °? Pressure or cramps in your lower abdomen. °? A backache that does not   go away or gets worse. °? Regular tightening (contractions) in your lower abdomen. °Summary °· Preterm birth means having your baby during weeks 20-37 of pregnancy. °· Preterm birth may put your baby at risk  for physical and mental problems. °· Getting good prenatal care can help prevent preterm birth. °· You can lower your risk of preterm birth by making certain lifestyle changes, such as not smoking and not using alcohol. °This information is not intended to replace advice given to you by your health care provider. Make sure you discuss any questions you have with your health care provider. °Document Released: 05/22/2015 Document Revised: 12/15/2015 Document Reviewed: 12/15/2015 °Elsevier Interactive Patient Education © 2018 [employer] ° °

## 2016-09-29 NOTE — Progress Notes (Signed)
   PRENATAL VISIT NOTE  Subjective:  Tara Sanchez is a 26 y.o. [id number] at 8661w1d being seen today for ongoing prenatal care.  She is currently monitored for the following issues for this high-risk pregnancy and has H/O 2 abortions; Maternal varicella, non-immune; Chlamydia infection complicating pregnancy in first trimester; Supervision of high-risk pregnancy; History of preterm delivery, currently pregnant; and HSV (herpes simplex virus) anogenital infection on her problem list.  Patient reports no complaints.  Contractions: Irregular. Vag. Bleeding: None.  Movement: Present. Denies leaking of fluid.   The following portions of the patient's history were reviewed and updated as appropriate: allergies, current medications, past family history, past medical history, past social history, past surgical history and problem list. Problem list updated.  Objective:   Vitals:   09/29/16 1418  BP: 129/66  Pulse: 97  Weight: 190 lb (86.2 kg)    Fetal Status: Fetal Heart Rate (bpm): 140   Movement: Present     General:  Alert, oriented and cooperative. Patient is in no acute distress.  Skin: Skin is warm and dry. No rash noted.   Cardiovascular: Normal heart rate noted  Respiratory: Normal respiratory effort, no problems with respiration noted  Abdomen: Soft, gravid, appropriate for gestational age. Pain/Pressure: Present     Pelvic:  Cervical exam deferred        Extremities: Normal range of motion.  Edema: None  Mental Status: Normal mood and affect. Normal behavior. Normal judgment and thought content.   Assessment and Plan:  Pregnancy: [id number] at 2861w1d  1. Supervision of high risk pregnancy in third trimester   2. History of preterm delivery, currently pregnant PTL precautions, advised her to take Valtrex as rx  Preterm labor symptoms and general obstetric precautions including but not limited to vaginal bleeding, contractions, leaking of fluid and fetal movement were reviewed in  detail with the patient. Please refer to After Visit Summary for other counseling recommendations.  Return in about 1 week (around 10/06/2016).   Scheryl DarterJames Arnold, MD

## 2016-10-06 ENCOUNTER — Encounter: Payer: Self-pay | Admitting: Obstetrics & Gynecology

## 2016-10-06 ENCOUNTER — Ambulatory Visit (INDEPENDENT_AMBULATORY_CARE_PROVIDER_SITE_OTHER): Payer: Medicaid Other | Admitting: Obstetrics & Gynecology

## 2016-10-06 VITALS — BP 116/62 | HR 91 | Wt 188.7 lb

## 2016-10-06 DIAGNOSIS — O09899 Supervision of other high risk pregnancies, unspecified trimester: Secondary | ICD-10-CM

## 2016-10-06 DIAGNOSIS — O0993 Supervision of high risk pregnancy, unspecified, third trimester: Secondary | ICD-10-CM

## 2016-10-06 DIAGNOSIS — O09213 Supervision of pregnancy with history of pre-term labor, third trimester: Secondary | ICD-10-CM

## 2016-10-06 DIAGNOSIS — O09219 Supervision of pregnancy with history of pre-term labor, unspecified trimester: Secondary | ICD-10-CM

## 2016-10-06 NOTE — Patient Instructions (Addendum)
Vaginal Delivery Vaginal delivery means that you will give birth by pushing your baby out of your birth canal (vagina). A team of health care providers will help you before, during, and after vaginal delivery. Birth experiences are unique for every woman and every pregnancy, and birth experiences vary depending on where you choose to give birth. What should I do to prepare for my baby's birth? Before your baby is born, it is important to talk with your health care provider about:  Your labor and delivery preferences. These may include: ? Medicines that you may be given. ? How you will manage your pain. This might include non-medical pain relief techniques or injectable pain relief such as epidural analgesia. ? How you and your baby will be monitored during labor and delivery. ? Who may be in the labor and delivery room with you. ? Your feelings about surgical delivery of your baby (cesarean delivery, or C-section) if this becomes necessary. ? Your feelings about receiving donated blood through an IV tube (blood transfusion) if this becomes necessary.  Whether you are able: ? To take pictures or videos of the birth. ? To eat during labor and delivery. ? To move around, walk, or change positions during labor and delivery.  What to expect after your baby is born, such as: ? Whether delayed umbilical cord clamping and cutting is offered. ? Who will care for your baby right after birth. ? Medicines or tests that may be recommended for your baby. ? Whether breastfeeding is supported in your hospital or birth center. ? How long you will be in the hospital or birth center.  How any medical conditions you have may affect your baby or your labor and delivery experience.  To prepare for your baby's birth, you should also:  Attend all of your health care visits before delivery (prenatal visits) as recommended by your health care provider. This is important.  Prepare your home for your baby's  arrival. Make sure that you have: ? Diapers. ? Baby clothing. ? Feeding equipment. ? Safe sleeping arrangements for you and your baby.  Install a car seat in your vehicle. Have your car seat checked by a certified car seat installer to make sure that it is installed safely.  Think about who will help you with your new baby at home for at least the first several weeks after delivery.  What can I expect when I arrive at the birth center or hospital? Once you are in labor and have been admitted into the hospital or birth center, your health care provider may:  Review your pregnancy history and any concerns you have.  Insert an IV tube into one of your veins. This is used to give you fluids and medicines.  Check your blood pressure, pulse, temperature, and heart rate (vital signs).  Check whether your bag of water (amniotic sac) has broken (ruptured).  Talk with you about your birth plan and discuss pain control options.  Monitoring Your health care provider may monitor your contractions (uterine monitoring) and your baby's heart rate (fetal monitoring). You may need to be monitored:  Often, but not continuously (intermittently).  All the time or for long periods at a time (continuously). Continuous monitoring may be needed if: ? You are taking certain medicines, such as medicine to relieve pain or make your contractions stronger. ? You have pregnancy or labor complications.  Monitoring may be done by:  Placing a special stethoscope or a handheld monitoring device on your abdomen to   check your baby's heartbeat, and feeling your abdomen for contractions. This method of monitoring does not continuously record your baby's heartbeat or your contractions.  Placing monitors on your abdomen (external monitors) to record your baby's heartbeat and the frequency and length of contractions. You may not have to wear external monitors all the time.  Placing monitors inside of your uterus  (internal monitors) to record your baby's heartbeat and the frequency, length, and strength of your contractions. ? Your health care provider may use internal monitors if he or she needs more information about the strength of your contractions or your baby's heart rate. ? Internal monitors are put in place by passing a thin, flexible wire through your vagina and into your uterus. Depending on the type of monitor, it may remain in your uterus or on your baby's head until birth. ? Your health care provider will discuss the benefits and risks of internal monitoring with you and will ask for your permission before inserting the monitors.  Telemetry. This is a type of continuous monitoring that can be done with external or internal monitors. Instead of having to stay in bed, you are able to move around during telemetry. Ask your health care provider if telemetry is an option for you.  Physical exam Your health care provider may perform a physical exam. This may include:  Checking whether your baby is positioned: ? With the head toward your vagina (head-down). This is most common. ? With the head toward the top of your uterus (head-up or breech). If your baby is in a breech position, your health care provider may try to turn your baby to a head-down position so you can deliver vaginally. If it does not seem that your baby can be born vaginally, your provider may recommend surgery to deliver your baby. In rare cases, you may be able to deliver vaginally if your baby is head-up (breech delivery). ? Lying sideways (transverse). Babies that are lying sideways cannot be delivered vaginally.  Checking your cervix to determine: ? Whether it is thinning out (effacing). ? Whether it is opening up (dilating). ? How low your baby has moved into your birth canal.  What are the three stages of labor and delivery?  Normal labor and delivery is divided into the following three stages: Stage 1  Stage 1 is the  longest stage of labor, and it can last for hours or days. Stage 1 includes: ? Early labor. This is when contractions may be irregular, or regular and mild. Generally, early labor contractions are more than 10 minutes apart. ? Active labor. This is when contractions get longer, more regular, more frequent, and more intense. ? The transition phase. This is when contractions happen very close together, are very intense, and may last longer than during any other part of labor.  Contractions generally feel mild, infrequent, and irregular at first. They get stronger, more frequent (about every 2-3 minutes), and more regular as you progress from early labor through active labor and transition.  Many women progress through stage 1 naturally, but you may need help to continue making progress. If this happens, your health care provider may talk with you about: ? Rupturing your amniotic sac if it has not ruptured yet. ? Giving you medicine to help make your contractions stronger and more frequent.  Stage 1 ends when your cervix is completely dilated to 4 inches (10 cm) and completely effaced. This happens at the end of the transition phase. Stage 2  Once   your cervix is completely effaced and dilated to 4 inches (10 cm), you may start to feel an urge to push. It is common for the body to naturally take a rest before feeling the urge to push, especially if you received an epidural or certain other pain medicines. This rest period may last for up to 1-2 hours, depending on your unique labor experience.  During stage 2, contractions are generally less painful, because pushing helps relieve contraction pain. Instead of contraction pain, you may feel stretching and burning pain, especially when the widest part of your baby's head passes through the vaginal opening (crowning).  Your health care provider will closely monitor your pushing progress and your baby's progress through the vagina during stage 2.  Your  health care provider may massage the area of skin between your vaginal opening and anus (perineum) or apply warm compresses to your perineum. This helps it stretch as the baby's head starts to crown, which can help prevent perineal tearing. ? In some cases, an incision may be made in your perineum (episiotomy) to allow the baby to pass through the vaginal opening. An episiotomy helps to make the opening of the vagina larger to allow more room for the baby to fit through.  It is very important to breathe and focus so your health care provider can control the delivery of your baby's head. Your health care provider may have you decrease the intensity of your pushing, to help prevent perineal tearing.  After delivery of your baby's head, the shoulders and the rest of the body generally deliver very quickly and without difficulty.  Once your baby is delivered, the umbilical cord may be cut right away, or this may be delayed for 1-2 minutes, depending on your baby's health. This may vary among health care providers, hospitals, and birth centers.  If you and your baby are healthy enough, your baby may be placed on your chest or abdomen to help maintain the baby's temperature and to help you bond with each other. Some mothers and babies start breastfeeding at this time. Your health care team will dry your baby and help keep your baby warm during this time.  Your baby may need immediate care if he or she: ? Showed signs of distress during labor. ? Has a medical condition. ? Was born too early (prematurely). ? Had a bowel movement before birth (meconium). ? Shows signs of difficulty transitioning from being inside the uterus to being outside of the uterus. If you are planning to breastfeed, your health care team will help you begin a feeding. Stage 3  The third stage of labor starts immediately after the birth of your baby and ends after you deliver the placenta. The placenta is an organ that develops  during pregnancy to provide oxygen and nutrients to your baby in the womb.  Delivering the placenta may require some pushing, and you may have mild contractions. Breastfeeding can stimulate contractions to help you deliver the placenta.  After the placenta is delivered, your uterus should tighten (contract) and become firm. This helps to stop bleeding in your uterus. To help your uterus contract and to control bleeding, your health care provider may: ? Give you medicine by injection, through an IV tube, by mouth, or through your rectum (rectally). ? Massage your abdomen or perform a vaginal exam to remove any blood clots that are left in your uterus. ? Empty your bladder by placing a thin, flexible tube (catheter) into your bladder. ? Encourage   you to breastfeed your baby. After labor is over, you and your baby will be monitored closely to ensure that you are both healthy until you are ready to go home. Your health care team will teach you how to care for yourself and your baby. This information is not intended to replace advice given to you by your health care provider. Make sure you discuss any questions you have with your health care provider. Document Released: 01/15/2008 Document Revised: 10/26/2015 Document Reviewed: 04/22/2015 Elsevier Interactive Patient Education  2018 [employer].  Back Pain in Pregnancy Back pain during pregnancy is common. Back pain may be caused by several factors that are related to changes during your pregnancy. Follow these instructions at home: Managing pain, stiffness, and swelling  If directed, apply ice for sudden (acute) back pain. ? Put ice in a plastic bag. ? Place a towel between your skin and the bag. ? Leave the ice on for 20 minutes, 2-3 times per day.  If directed, apply heat to the affected area before you exercise: ? Place a towel between your skin and the heat pack or heating pad. ? Leave the heat on for 20-30 minutes. ? Remove the heat if  your skin turns bright red. This is especially important if you are unable to feel pain, heat, or cold. You may have a greater risk of getting burned. Activity  Exercise as told by your health care provider. Exercising is the best way to prevent or manage back pain.  Listen to your body when lifting. If lifting hurts, ask for help or bend your knees. This uses your leg muscles instead of your back muscles.  Squat down when picking up something from the floor. Do not bend over.  Only use bed rest as told by your health care provider. Bed rest should only be used for the most severe episodes of back pain. Standing, Sitting, and Lying Down  Do not stand in one place for long periods of time.  Use good posture when sitting. Make sure your head rests over your shoulders and is not hanging forward. Use a pillow on your lower back if necessary.  Try sleeping on your side, preferably the left side, with a pillow or two between your legs. If you are sore after a night's rest, your bed may be too soft. A firm mattress may provide more support for your back during pregnancy. General instructions  Do not wear high heels.  Eat a healthy diet. Try to gain weight within your health care provider's recommendations.  Use a maternity girdle, elastic sling, or back brace as told by your health care provider.  Take over-the-counter and prescription medicines only as told by your health care provider.  Keep all follow-up visits as told by your health care provider. This is important. This includes any visits with any specialists, such as a physical therapist. Contact a health care provider if:  Your back pain interferes with your daily activities.  You have increasing pain in other parts of your body. Get help right away if:  You develop numbness, tingling, weakness, or problems with the use of your arms or legs.  You develop severe back pain that is not controlled with medicine.  You have a sudden  change in bowel or bladder control.  You develop shortness of breath, dizziness, or you faint.  You develop nausea, vomiting, or sweating.  You have back pain that is a rhythmic, cramping pain similar to labor pains. Labor pain is  usually 1-2 minutes apart, lasts for about 1 minute, and involves a bearing down feeling or pressure in your pelvis.  You have back pain and your water breaks or you have vaginal bleeding.  You have back pain or numbness that travels down your leg.  Your back pain developed after you fell.  You develop pain on one side of your back.  You see blood in your urine.  You develop skin blisters in the area of your back pain. This information is not intended to replace advice given to you by your health care provider. Make sure you discuss any questions you have with your health care provider. Document Released: 07/16/2005 Document Revised: 09/13/2015 Document Reviewed: 12/20/2014 Elsevier Interactive Patient Education  [employer]2018 [employer] Inc[employer]

## 2016-10-06 NOTE — Progress Notes (Signed)
   PRENATAL VISIT NOTE  Subjective:  Tara Sanchez is a 26 y.o. [id number] at 2648w1d being seen today for ongoing prenatal care.  She is currently monitored for the following issues for this high-risk pregnancy and has H/O 2 abortions; Maternal varicella, non-immune; Chlamydia infection complicating pregnancy in first trimester; Supervision of high-risk pregnancy; History of preterm delivery, currently pregnant; and HSV (herpes simplex virus) anogenital infection on her problem list.  Patient reports backache.   . Vag. Bleeding: None.  Movement: Present. Denies leaking of fluid.   The following portions of the patient's history were reviewed and updated as appropriate: allergies, current medications, past family history, past medical history, past social history, past surgical history and problem list. Problem list updated.  Objective:   Vitals:   10/06/16 1243  BP: 116/62  Pulse: 91  Weight: 188 lb 11.2 oz (85.6 kg)    Fetal Status: Fetal Heart Rate (bpm): 145 Fundal Height: 36 cm Movement: Present     General:  Alert, oriented and cooperative. Patient is in no acute distress.  Skin: Skin is warm and dry. No rash noted.   Cardiovascular: Normal heart rate noted  Respiratory: Normal respiratory effort, no problems with respiration noted  Abdomen: Soft, gravid, appropriate for gestational age. Pain/Pressure: Present     Pelvic:  Cervical exam deferred        Extremities: Normal range of motion.  Edema: None  Mental Status: Normal mood and affect. Normal behavior. Normal judgment and thought content.   Assessment and Plan:  Pregnancy: [id number] at 7648w1d  1. Supervision of high risk pregnancy in third trimester H/o PTL  2. History of preterm delivery, currently pregnant MS back pain  Term labor symptoms and general obstetric precautions including but not limited to vaginal bleeding, contractions, leaking of fluid and fetal movement were reviewed in detail with the patient. Please refer  to After Visit Summary for other counseling recommendations.  Return in about 1 week (around 10/13/2016).   Scheryl DarterJames Deborh Pense, MD

## 2016-10-13 ENCOUNTER — Ambulatory Visit (INDEPENDENT_AMBULATORY_CARE_PROVIDER_SITE_OTHER): Payer: Medicaid Other | Admitting: Family Medicine

## 2016-10-13 VITALS — BP 120/60 | HR 99 | Wt 189.6 lb

## 2016-10-13 DIAGNOSIS — O09213 Supervision of pregnancy with history of pre-term labor, third trimester: Secondary | ICD-10-CM

## 2016-10-13 DIAGNOSIS — A6009 Herpesviral infection of other urogenital tract: Secondary | ICD-10-CM

## 2016-10-13 DIAGNOSIS — O09899 Supervision of other high risk pregnancies, unspecified trimester: Secondary | ICD-10-CM

## 2016-10-13 DIAGNOSIS — O0993 Supervision of high risk pregnancy, unspecified, third trimester: Secondary | ICD-10-CM

## 2016-10-13 DIAGNOSIS — A609 Anogenital herpesviral infection, unspecified: Secondary | ICD-10-CM

## 2016-10-13 DIAGNOSIS — O98313 Other infections with a predominantly sexual mode of transmission complicating pregnancy, third trimester: Secondary | ICD-10-CM

## 2016-10-13 DIAGNOSIS — O09219 Supervision of pregnancy with history of pre-term labor, unspecified trimester: Secondary | ICD-10-CM

## 2016-10-13 NOTE — Progress Notes (Signed)
PHQ9 and GAD7 scores are each 18. Patient denies SI. Asher MuirJamie is not available today.

## 2016-10-13 NOTE — Progress Notes (Signed)
   PRENATAL VISIT NOTE  Subjective:  Tara Sanchez is a 26 y.o. [id number] at 6032w1d being seen today for ongoing prenatal care.  She is currently monitored for the following issues for this high-risk pregnancy and has H/O 2 abortions; Maternal varicella, non-immune; Chlamydia infection complicating pregnancy in first trimester; Supervision of high-risk pregnancy; History of preterm delivery, currently pregnant; and HSV (herpes simplex virus) anogenital infection on her problem list.  Patient reports no complaints.   Contractions: Irregular. Vag. Bleeding: None.  Movement: Present. Denies leaking of fluid.   The following portions of the patient's history were reviewed and updated as appropriate: allergies, current medications, past family history, past medical history, past social history, past surgical history and problem list. Problem list updated.  Objective:   Vitals:   10/13/16 1241  BP: 120/60  Pulse: 99  Weight: 189 lb 9.6 oz (86 kg)    Fetal Status: Fetal Heart Rate (bpm): 140   Movement: Present  Presentation: Vertex  General:  Alert, oriented and cooperative. Patient is in no acute distress.  Skin: Skin is warm and dry. No rash noted.   Cardiovascular: Normal heart rate noted  Respiratory: Normal respiratory effort, no problems with respiration noted  Abdomen: Soft, gravid, appropriate for gestational age. Pain/Pressure: Present     Pelvic:  Cervical exam performed Dilation: 3.5 Effacement (%): 50 Station: -2  Extremities: Normal range of motion.  Edema: None  Mental Status: Normal mood and affect. Normal behavior. Normal judgment and thought content.   Assessment and Plan:  Pregnancy: [id number] at 4632w1d  1. Supervision of high risk pregnancy in third trimester FHT and FH normal  2. History of preterm delivery, currently pregnant  3. HSV (herpes simplex virus) anogenital infection On valtrex  Term labor symptoms and general obstetric precautions including but not limited  to vaginal bleeding, contractions, leaking of fluid and fetal movement were reviewed in detail with the patient. Please refer to After Visit Summary for other counseling recommendations.  Return in about 1 week (around 10/20/2016) for OB f/u.   Levie HeritageJacob J Stinson, DO

## 2016-10-18 ENCOUNTER — Encounter (HOSPITAL_COMMUNITY): Payer: Self-pay

## 2016-10-18 ENCOUNTER — Inpatient Hospital Stay (HOSPITAL_COMMUNITY)
Admission: AD | Admit: 2016-10-18 | Discharge: 2016-10-20 | DRG: 775 | Disposition: A | Discharge status: Home / Self Care | Payer: Medicaid Other | Source: Ambulatory Visit | Attending: Obstetrics & Gynecology | Admitting: Obstetrics & Gynecology

## 2016-10-18 DIAGNOSIS — Z87891 Personal history of nicotine dependence: Secondary | ICD-10-CM

## 2016-10-18 DIAGNOSIS — Z3A38 38 weeks gestation of pregnancy: Secondary | ICD-10-CM

## 2016-10-18 DIAGNOSIS — Z880 Allergy status to penicillin: Secondary | ICD-10-CM | POA: Diagnosis not present

## 2016-10-18 DIAGNOSIS — Z3493 Encounter for supervision of normal pregnancy, unspecified, third trimester: Secondary | ICD-10-CM | POA: Diagnosis present

## 2016-10-18 LAB — TYPE AND SCREEN
ABO/RH(D): B POS
Antibody Screen: NEGATIVE

## 2016-10-18 LAB — CBC
HCT: 38.5 % (ref 36.0–46.0)
Hemoglobin: 13.1 g/dL (ref 12.0–15.0)
MCH: 31.1 pg (ref 26.0–34.0)
MCHC: 34 g/dL (ref 30.0–36.0)
MCV: 91.4 fL (ref 78.0–100.0)
PLATELETS: 248 10*3/uL (ref 150–400)
RBC: 4.21 MIL/uL (ref 3.87–5.11)
RDW: 13.6 % (ref 11.5–15.5)
WBC: 17.7 10*3/uL — ABNORMAL HIGH (ref 4.0–10.5)

## 2016-10-18 MED ORDER — LACTATED RINGERS IV SOLN: INTRAVENOUS | Status: DC

## 2016-10-18 MED ORDER — SIMETHICONE 80 MG PO CHEW: 80.0000 mg | CHEWABLE_TABLET | ORAL | Status: DC | PRN

## 2016-10-18 MED ORDER — ZOLPIDEM TARTRATE 5 MG PO TABS: 5.0000 mg | ORAL_TABLET | Freq: Every evening | ORAL | Status: DC | PRN

## 2016-10-18 MED ORDER — OXYCODONE HCL 5 MG PO TABS: 10.0000 mg | ORAL_TABLET | ORAL | Status: DC | PRN

## 2016-10-18 MED ORDER — DIBUCAINE 1 % RE OINT: 1.0000 "application " | TOPICAL_OINTMENT | RECTAL | Status: DC | PRN

## 2016-10-18 MED ORDER — SOD CITRATE-CITRIC ACID 500-334 MG/5ML PO SOLN: 30.0000 mL | ORAL | Status: DC | PRN

## 2016-10-18 MED ORDER — MISOPROSTOL 200 MCG PO TABS
ORAL_TABLET | ORAL | Status: AC
  Filled 2016-10-18: qty 4

## 2016-10-18 MED ORDER — OXYTOCIN 40 UNITS IN LACTATED RINGERS INFUSION - SIMPLE MED: 2.5000 [IU]/h | INTRAVENOUS | Status: DC

## 2016-10-18 MED ORDER — LACTATED RINGERS IV SOLN: 500.0000 mL | INTRAVENOUS | Status: DC | PRN

## 2016-10-18 MED ORDER — ONDANSETRON HCL 4 MG/2ML IJ SOLN: 4.0000 mg | Freq: Four times a day (QID) | INTRAMUSCULAR | Status: DC | PRN

## 2016-10-18 MED ORDER — COCONUT OIL OIL: 1.0000 "application " | TOPICAL_OIL | Status: DC | PRN

## 2016-10-18 MED ORDER — METHYLERGONOVINE MALEATE 0.2 MG PO TABS: 0.2000 mg | ORAL_TABLET | ORAL | Status: DC | PRN

## 2016-10-18 MED ORDER — TETANUS-DIPHTH-ACELL PERTUSSIS 5-2.5-18.5 LF-MCG/0.5 IM SUSP: 0.5000 mL | Freq: Once | INTRAMUSCULAR | Status: DC

## 2016-10-18 MED ORDER — MEASLES, MUMPS & RUBELLA VAC ~~LOC~~ INJ
0.5000 mL | INJECTION | Freq: Once | SUBCUTANEOUS | Status: DC
  Filled 2016-10-18: qty 0.5

## 2016-10-18 MED ORDER — FLEET ENEMA 7-19 GM/118ML RE ENEM: 1.0000 | ENEMA | RECTAL | Status: DC | PRN

## 2016-10-18 MED ORDER — MISOPROSTOL 200 MCG PO TABS
800.0000 ug | ORAL_TABLET | Freq: Once | ORAL | Status: AC
  Administered 2016-10-18: 800 ug via ORAL

## 2016-10-18 MED ORDER — IBUPROFEN 600 MG PO TABS
600.0000 mg | ORAL_TABLET | Freq: Four times a day (QID) | ORAL | Status: DC
  Administered 2016-10-18 – 2016-10-19 (×6): 600 mg via ORAL
  Filled 2016-10-18 (×9): qty 1

## 2016-10-18 MED ORDER — ONDANSETRON HCL 4 MG PO TABS: 4.0000 mg | ORAL_TABLET | ORAL | Status: DC | PRN

## 2016-10-18 MED ORDER — OXYCODONE-ACETAMINOPHEN 5-325 MG PO TABS: 1.0000 | ORAL_TABLET | ORAL | Status: DC | PRN

## 2016-10-18 MED ORDER — WITCH HAZEL-GLYCERIN EX PADS: 1.0000 "application " | MEDICATED_PAD | CUTANEOUS | Status: DC | PRN

## 2016-10-18 MED ORDER — OXYCODONE HCL 5 MG PO TABS: 5.0000 mg | ORAL_TABLET | ORAL | Status: DC | PRN

## 2016-10-18 MED ORDER — LIDOCAINE HCL (PF) 1 % IJ SOLN
30.0000 mL | INTRAMUSCULAR | Status: DC | PRN
  Filled 2016-10-18: qty 30

## 2016-10-18 MED ORDER — OXYTOCIN 10 UNIT/ML IJ SOLN
INTRAMUSCULAR | Status: AC
  Administered 2016-10-18: 10 [IU]
  Filled 2016-10-18: qty 1

## 2016-10-18 MED ORDER — ACETAMINOPHEN 325 MG PO TABS
650.0000 mg | ORAL_TABLET | ORAL | Status: DC | PRN
  Administered 2016-10-18: 650 mg via ORAL
  Filled 2016-10-18: qty 2

## 2016-10-18 MED ORDER — METHYLERGONOVINE MALEATE 0.2 MG/ML IJ SOLN: 0.2000 mg | INTRAMUSCULAR | Status: DC | PRN

## 2016-10-18 MED ORDER — OXYTOCIN BOLUS FROM INFUSION: 500.0000 mL | Freq: Once | INTRAVENOUS | Status: DC

## 2016-10-18 MED ORDER — ONDANSETRON HCL 4 MG/2ML IJ SOLN: 4.0000 mg | INTRAMUSCULAR | Status: DC | PRN

## 2016-10-18 MED ORDER — ACETAMINOPHEN 325 MG PO TABS: 650.0000 mg | ORAL_TABLET | ORAL | Status: DC | PRN

## 2016-10-18 MED ORDER — OXYCODONE-ACETAMINOPHEN 5-325 MG PO TABS: 2.0000 | ORAL_TABLET | ORAL | Status: DC | PRN

## 2016-10-18 NOTE — H&P (Signed)
Tara Sanchez is a 26 y.o. female [phone] at 38.6 weeks presenting in MAU completely dilated. She reports contractions started at 0300 and her water broke at 0430 with clear fluid.   Clinic [facility] Prenatal Labs  Dating 5 wk MSD Blood type: B/Positive/-- (02/26 1633)   Genetic Screen 1 Screen: nml (T21 1:2157)   AFP:   WNL  Antibody:Negative (02/26 1633)  Anatomic [name] Nml anatomy Rubella: 11.90 (02/26 1633)  GTT Third trimester:  1 hr 48 RPR: Non Reactive (02/26 1633)   Flu vaccine 03/2016 HBsAg: Negative (02/26 1633)   TDaP vaccine   08/11/2016                                            HIV:   no reactive  Baby Food   Bottle                              GBS: (For PCN allergy, check sensitivities)  Contraception BTL Pap: WNL  Circumcision girl   Pediatrician [facility]   Support Person Not having anyone at this time (not in contact with FOB)    OB History    Gravida Para Term Preterm AB Living   5 2 0 2 2 2    SAB TAB Ectopic Multiple Live Births     2   0 2     Past Medical History:  Diagnosis Date  . Genital herpes   . Headache   . History of preterm delivery, currently pregnant 04/17/2016   makena  . Infection    UTI   Past Surgical History:  Procedure Laterality Date  . THERAPEUTIC ABORTION     Family History: family history includes Diabetes in her paternal grandmother. Social History:  reports that she has quit smoking. Her smoking use included Cigars. She quit after 2.00 years of use. She has quit using smokeless tobacco. She reports that she does not drink alcohol or use drugs.    Maternal Diabetes: No Genetic Screening: Normal Maternal Ultrasounds/Referrals: Normal Fetal Ultrasounds or other Referrals:  None Maternal Substance Abuse:  No Significant Maternal Medications:  None Significant Maternal Lab Results:  Lab values include: Group B Strep negative Other Comments:  None  Review of Systems  Constitutional: Negative.  Negative for chills and fever.   Respiratory: Negative.  Negative for shortness of breath.   Cardiovascular: Negative.  Negative for chest pain.  Gastrointestinal: Positive for abdominal pain.  Genitourinary: Negative.   Neurological: Negative.    Maternal Medical History:  Reason for admission: Rupture of membranes and contractions.   Contractions: Onset was 1-2 hours ago.   Frequency: regular.   Duration is approximately 60 seconds.   Perceived severity is strong.    Fetal activity: Perceived fetal activity is normal.   Last perceived fetal movement was within the past hour.    Prenatal complications: no prenatal complications Prenatal Complications - Diabetes: none.    Dilation: 9 Effacement (%): 90 Station: 0 Exam by:: benji stanley RN Blood pressure 126/73, pulse 72, resp. rate (!) 22, last menstrual period 01/07/2016, not currently breastfeeding. Maternal Exam:  Uterine Assessment: Contraction strength is moderate.  Contraction duration is 60 seconds. Contraction frequency is regular.   Abdomen: Patient reports no abdominal tenderness. Fetal presentation: vertex  Introitus: Normal vulva. Normal vagina.  Ferning test: not done.  Nitrazine test: not done. Amniotic fluid character: clear.  Pelvis: adequate for delivery.   Cervix: Cervix evaluated by digital exam.     Fetal Exam Fetal Monitor Review: Mode: ultrasound.   Baseline rate: 115.  Variability: moderate (6-25 bpm).   Intermittent monitoring  Fetal State Assessment: Category I - tracings are normal.     Physical Exam  Nursing note and vitals reviewed. Constitutional: She is oriented to person, place, and time. She appears well-developed and well-nourished.  HENT:  Head: Normocephalic and atraumatic.  Eyes: Conjunctivae are normal. No scleral icterus.  Cardiovascular: Normal rate, regular rhythm and normal heart sounds.   Respiratory: Effort normal and breath sounds normal. No respiratory distress.  GI: Soft. She exhibits no  distension. There is no tenderness.  Neurological: She is alert and oriented to person, place, and time.  Skin: Skin is warm and dry.  Psychiatric: She has a normal mood and affect. Her behavior is normal. Judgment and thought content normal.    Prenatal labs: ABO, Rh: B/Positive/-- (02/26 1633) Antibody: Negative (02/26 1633) Rubella: 11.90 (02/26 1633) RPR: Non Reactive (05/07 1017)  HBsAg: Negative (02/26 1633)  HIV: Non Reactive (03/26 0948)  GBS:  Negative   Assessment/Plan: IUP at term. SOL/SROM. GBS neg. Complete dilation  Admit to birthing suites Anticipate NSVD soon. MOF: bottle MOC: was planning tubal but now unsure  Cleone SlimCaroline Neill SNM 10/18/2016, 5:06 AM  I confirm that I have verified the information documented in the SNM's note and that I have also personally reperformed the physical exam and all medical decision making activities.

## 2016-10-18 NOTE — MAU Note (Signed)
Water broke 10 min ago. I was 4 cm at my last appointment. Having contractions every minute.

## 2016-10-19 LAB — RPR: RPR: NONREACTIVE

## 2016-10-19 NOTE — Progress Notes (Signed)
POSTPARTUM PROGRESS NOTE  Post Partum Day 1 Subjective:  Tara Sanchez is a 26 y.o. Y7W2956G5P1223 7640w6d s/p nsvd.  No acute events overnight.  Pt denies problems with ambulating, voiding or po intake.  She denies nausea or vomiting.  Pain is well controlled.  She has had flatus. She has not had bowel movement.  Lochia Small.   Objective: Blood pressure (!) 107/57, pulse (!) 54, temperature 97.9 F (36.6 C), temperature source Oral, resp. rate 18, height 5\' 7"  (1.702 m), weight 189 lb (85.7 kg), last menstrual period 01/07/2016, SpO2 98 %, unknown if currently breastfeeding.  Physical Exam:  General: alert, cooperative and no distress Lochia:normal flow Chest: CTAB Heart: RRR no m/r/g Abdomen: +BS, soft, nontender,  Uterine Fundus: firm,  DVT Evaluation: No calf swelling or tenderness Extremities: trace edema   Recent Labs  10/18/16 0650  HGB 13.1  HCT 38.5    Assessment/Plan:  ASSESSMENT: Tara Sanchez is a 26 y.o. O1H0865G5P1223 2940w6d s/p nsvd, doing well.  Plan for discharge tomorrow   LOS: 1 day   Silvano Bilisoah B Wouk 10/19/2016, 6:49 AM

## 2016-10-19 NOTE — Progress Notes (Signed)
CSW received consult due to MOB's hx of anxiety and depression.    CSW provided education regarding Baby Blues vs PMADs and provided MOB with information about support groups held at [facility].  CSW encouraged MOB to evaluate her mental health throughout the postpartum period with the use of the New Mom Checklist developed by Postpartum Progress and notify a medical professional if symptoms arise.   MOB verbalized understanding and noted no further needs. Additionally, MOB noted needing assistance with a car seat to get home. CSW informed MOB that a car seat would be $30.00 cash. MOB noted she could provide that. This Clinical research associatewriter notified page with house coverage who noted she would provide MOB with one.  CSW identifies no further need for intervention at this time or barriers to discharge.  Susa Bones, MSW, LCSW-A Clinical Social Worker  [facility]  Office: [phone]

## 2016-10-20 MED ORDER — METOCLOPRAMIDE HCL 10 MG PO TABS: 10.0000 mg | ORAL_TABLET | Freq: Once | ORAL | Status: DC

## 2016-10-20 MED ORDER — FAMOTIDINE 20 MG PO TABS: 40.0000 mg | ORAL_TABLET | Freq: Once | ORAL | Status: DC

## 2016-10-20 MED ORDER — LACTATED RINGERS IV SOLN
INTRAVENOUS | Status: DC
Start: 2016-10-20 — End: 2016-10-20

## 2016-10-20 NOTE — Discharge Summary (Signed)
OB Discharge Summary     Patient Name: Tara Sanchez DOB: June 07, 1990 MRN: [id number]  Date of admission: 10/18/2016 Delivering MD: Rolm BookbinderNEILL, CAROLINE M   Date of discharge: 10/20/2016  Admitting diagnosis: 39WKS CONTRACTIONS 1MINS  Intrauterine pregnancy: 1571w6d     Secondary diagnosis:  Active Problems:   Normal labor  Additional problems:      Discharge diagnosis: Term Pregnancy Delivered                                                                                                Post partum procedures:None  Augmentation: fully dilated  Complications: None  Hospital course:  Onset of Labor With Vaginal Delivery     26 y.o. yo G6Y4034G5P1223 at 8171w6d was admitted in Active Labor on 10/18/2016. Patient had an uncomplicated labor course as follows:  Membrane Rupture Time/Date: 4:00 AM ,10/18/2016   Intrapartum Procedures: Episiotomy: None [1]                                         Lacerations:  None [1]  Patient had a delivery of a Viable infant. 10/18/2016  Information for the patient's newborn:  Gloris HamSmith, Girl Samar [[id number]][[id number]]  Delivery Method: Vaginal, Spontaneous Delivery (Filed from Delivery Summary)    Pateint had an uncomplicated postpartum course.  She is ambulating, tolerating a regular diet, passing flatus, and urinating well. Patient is discharged home in stable condition on 10/20/16.   Physical exam  Vitals:   10/18/16 1945 10/19/16 0700 10/19/16 1813 10/20/16 0610  BP: (!) 107/57 109/62 (!) 107/53 120/67  Pulse: (!) 54 (!) 54 71 68  Resp: 18 18 19 18   Temp: 97.9 F (36.6 C) 98.1 F (36.7 C) 98.2 F (36.8 C) 97.9 F (36.6 C)  TempSrc: Oral  Oral   SpO2:   100%   Weight:      Height:       General: alert, cooperative and no distress Lochia: appropriate Uterine Fundus: firm Incision: N/A DVT Evaluation: No evidence of DVT seen on physical exam. Labs: Lab Results  Component Value Date   WBC 17.7 (H) 10/18/2016   HGB 13.1 10/18/2016   HCT 38.5 10/18/2016   MCV 91.4 10/18/2016   PLT 248 10/18/2016   CMP Latest Ref Rng & Units 06/21/2015  Glucose 65 - 99 mg/dL 99  BUN 6 - 20 mg/dL 10  Creatinine 7.560.44 - 4.331.00 mg/dL 2.950.88  Sodium 188135 - 416145 mmol/L 142  Potassium 3.5 - 5.1 mmol/L 4.2  Chloride 101 - 111 mmol/L 103  CO2 22 - 32 mmol/L 27  Calcium 8.9 - 10.3 mg/dL 9.4  Total Protein 6.5 - 8.1 g/dL -  Total Bilirubin 0.3 - 1.2 mg/dL -  Alkaline Phos 38 - 606126 U/L -  AST 15 - 41 U/L -  ALT 14 - 54 U/L -    Discharge instruction: per After Visit Summary and "Baby and Me Booklet".  After visit meds:  Allergies as of 10/20/2016      Reactions  Macrobid [nitrofurantoin] Anaphylaxis   Phenergan [promethazine Hcl] Anaphylaxis   Tessalon [benzonatate] Anaphylaxis      Medication List    You have not been prescribed any medications.     Diet: routine diet  Activity: Advance as tolerated. Pelvic rest for 6 weeks.   Outpatient follow up:6 weeks Follow up Appt: Future Appointments Date Time Provider Department Center  12/01/2016 10:20 AM Dorathy Kinsman, [place] [facility]   Follow up Visit:No Follow-up on file.  Postpartum contraception: None Nexplanon vs. IUD  Newborn Data: Live born female  Birth Weight: 5 lb 6.4 oz (2449 g) APGAR: 8, 9  Baby Feeding: Breast Disposition:home with mother   10/20/2016 Marylene Land, CNM

## 2016-10-23 ENCOUNTER — Encounter: Payer: Self-pay | Admitting: Student

## 2016-10-23 ENCOUNTER — Telehealth: Payer: Self-pay | Admitting: General Practice

## 2016-10-23 NOTE — Telephone Encounter (Signed)
[facility] [phone] concerning claim #[id number] inquiring about patient's delivery type/date. Per chart review, patient has not dropped off FMLA paperwork or signed a release. Called patient and discussed with her that she needs to come in and sign a release of information before we can discuss her information with [employer] & she will also need to pay a fee. Patient verbalized understanding and states she will come by and sign the form. Patient had no questions

## 2016-11-14 ENCOUNTER — Encounter: Payer: Self-pay | Admitting: Family Medicine

## 2016-11-20 ENCOUNTER — Telehealth: Payer: Self-pay

## 2016-11-20 NOTE — Telephone Encounter (Signed)
LM for pt stating that we have completed her FMLA paperwork and faxed as she has requested. Pt can also pick up her copy from the front office.

## 2016-11-26 ENCOUNTER — Encounter: Payer: Self-pay | Admitting: Family Medicine

## 2016-12-01 ENCOUNTER — Ambulatory Visit: Payer: Self-pay | Admitting: Advanced Practice Midwife

## 2016-12-29 ENCOUNTER — Ambulatory Visit: Payer: Self-pay | Admitting: Medical

## 2016-12-29 ENCOUNTER — Encounter: Payer: Self-pay | Admitting: Medical

## 2017-01-11 ENCOUNTER — Encounter (HOSPITAL_COMMUNITY): Payer: Self-pay | Admitting: Emergency Medicine

## 2017-01-11 ENCOUNTER — Emergency Department (HOSPITAL_COMMUNITY)
Admission: EM | Admit: 2017-01-11 | Discharge: 2017-01-11 | Discharge status: Against Medical Advice | Payer: Medicaid Other | Attending: Emergency Medicine | Admitting: Emergency Medicine

## 2017-01-11 ENCOUNTER — Encounter (HOSPITAL_COMMUNITY): Payer: Self-pay

## 2017-01-11 ENCOUNTER — Inpatient Hospital Stay (EMERGENCY_DEPARTMENT_HOSPITAL)
Admission: AD | Admit: 2017-01-11 | Discharge: 2017-01-11 | Disposition: A | Discharge status: Home / Self Care | Payer: Medicaid Other | Source: Ambulatory Visit | Attending: Obstetrics and Gynecology | Admitting: Obstetrics and Gynecology

## 2017-01-11 DIAGNOSIS — R6883 Chills (without fever): Secondary | ICD-10-CM | POA: Diagnosis not present

## 2017-01-11 DIAGNOSIS — R1110 Vomiting, unspecified: Secondary | ICD-10-CM | POA: Insufficient documentation

## 2017-01-11 DIAGNOSIS — J029 Acute pharyngitis, unspecified: Secondary | ICD-10-CM | POA: Diagnosis not present

## 2017-01-11 DIAGNOSIS — Z5321 Procedure and treatment not carried out due to patient leaving prior to being seen by health care provider: Secondary | ICD-10-CM | POA: Insufficient documentation

## 2017-01-11 DIAGNOSIS — J020 Streptococcal pharyngitis: Secondary | ICD-10-CM | POA: Diagnosis not present

## 2017-01-11 LAB — URINALYSIS, ROUTINE W REFLEX MICROSCOPIC
Bilirubin Urine: NEGATIVE
GLUCOSE, UA: NEGATIVE mg/dL
Hgb urine dipstick: NEGATIVE
KETONES UR: NEGATIVE mg/dL
Nitrite: NEGATIVE
PROTEIN: 30 mg/dL — AB
Specific Gravity, Urine: 1.033 — ABNORMAL HIGH (ref 1.005–1.030)
pH: 5 (ref 5.0–8.0)

## 2017-01-11 LAB — POCT PREGNANCY, URINE: Preg Test, Ur: NEGATIVE

## 2017-01-11 LAB — INFLUENZA PANEL BY PCR (TYPE A & B)
INFLAPCR: NEGATIVE
INFLBPCR: NEGATIVE

## 2017-01-11 LAB — RAPID STREP SCREEN (MED CTR MEBANE ONLY): Streptococcus, Group A Screen (Direct): POSITIVE — AB

## 2017-01-11 MED ORDER — PENICILLIN V POTASSIUM 500 MG PO TABS: 500.0000 mg | ORAL_TABLET | Freq: Three times a day (TID) | ORAL | 0 refills | Status: DC

## 2017-01-11 MED ORDER — ACETAMINOPHEN 325 MG PO TABS
650.0000 mg | ORAL_TABLET | Freq: Once | ORAL | Status: AC | PRN
  Administered 2017-01-11: 650 mg via ORAL
  Filled 2017-01-11: qty 2

## 2017-01-11 NOTE — MAU Provider Note (Signed)
History     CSN: [id number]  Arrival date and time: 01/11/17 1724  Seen by provider at 2040     Chief Complaint  Patient presents with  . Fever  . Sore Throat  . Vaginal Discharge  . Abdominal Pain   HPI Has had a sore throat for a couple of days.  Has body aches, fever, chills, and had vomiting yesterday but none today.  Thought she had the flu.  So has been to [facility] and [facility] but left due to the long waits.  Came here for evaluation.  Has had one dose of Tylenol earlier today.  OB History    Gravida Para Term Preterm AB Living   SAB TAB Ectopic Multiple Live Births     2   0 3      Past Medical History:  Diagnosis Date  . Genital herpes   . Headache   . History of preterm delivery, currently pregnant 04/17/2016   makena  . Infection    UTI    Past Surgical History:  Procedure Laterality Date  . THERAPEUTIC ABORTION      Family History  Problem Relation Age of Onset  . Diabetes Paternal Grandmother   . Asthma Neg Hx   . Cancer Neg Hx   . Heart disease Neg Hx   . Hypertension Neg Hx   . Stroke Neg Hx   . Alcohol abuse Neg Hx   . Arthritis Neg Hx   . Birth defects Neg Hx   . COPD Neg Hx   . Depression Neg Hx   . Drug abuse Neg Hx   . Early death Neg Hx   . Hearing loss Neg Hx   . Hyperlipidemia Neg Hx   . Kidney disease Neg Hx   . Learning disabilities Neg Hx   . Mental illness Neg Hx   . Mental retardation Neg Hx   . Miscarriages / Stillbirths Neg Hx   . Vision loss Neg Hx   . Varicose Veins Neg Hx     Social History  Substance Use Topics  . Smoking status: Former Smoker    Years: 2.00    Types: Cigars  . Smokeless tobacco: Former Neurosurgeon  . Alcohol use No     Comment: Social    Allergies:  Allergies  Allergen Reactions  . Macrobid [Nitrofurantoin] Anaphylaxis  . Phenergan [Promethazine Hcl] Anaphylaxis  . Tessalon [Benzonatate] Anaphylaxis    No prescriptions prior to admission.    Review of Systems   Constitutional: Positive for chills and fever.  HENT: Positive for sore throat. Negative for sneezing.   Respiratory: Negative for cough and shortness of breath.   Musculoskeletal:       Neck is sore and feels tight   Physical Exam   Blood pressure (!) 116/91, pulse (!) 102, temperature 99.5 F (37.5 C), resp. rate 16, weight 168 lb (76.2 kg), unknown if currently breastfeeding.  Physical Exam  HENT:  Mouth/Throat: No oropharyngeal exudate.  Pharynx and tonsils are red.  No exudate  Eyes:  Mildly red inner eyelids, no discharge in eyes  Neck: Normal range of motion. Neck supple.  Neck is tender but unable to palpate enlarged nodes    MAU Course  Procedures Results for orders placed or performed during the hospital encounter of 01/11/17 (from the past 24 hour(s))  Urinalysis, Routine w reflex microscopic     Status: Abnormal   Collection Time: 01/11/17  6:02 PM  Result Value Ref Range   Color, Urine YELLOW YELLOW   APPearance HAZY (A) CLEAR   Specific Gravity, Urine 1.033 (H) 1.005 - 1.030   pH 5.0 5.0 - 8.0   Glucose, UA NEGATIVE NEGATIVE mg/dL   Hgb urine dipstick NEGATIVE NEGATIVE   Bilirubin Urine NEGATIVE NEGATIVE   Ketones, ur NEGATIVE NEGATIVE mg/dL   Protein, ur 30 (A) NEGATIVE mg/dL   Nitrite NEGATIVE NEGATIVE   Leukocytes, UA SMALL (A) NEGATIVE   RBC / HPF 0-5 0 - 5 RBC/hpf   WBC, UA TOO NUMEROUS TO COUNT 0 - 5 WBC/hpf   Bacteria, UA RARE (A) NONE SEEN   Squamous Epithelial / LPF 6-30 (A) NONE SEEN   Mucus PRESENT   Pregnancy, urine POC     Status: None   Collection Time: 01/11/17  6:11 PM  Result Value Ref Range   Preg Test, Ur NEGATIVE NEGATIVE  Rapid strep screen     Status: Abnormal   Collection Time: 01/11/17  6:24 PM  Result Value Ref Range   Streptococcus, Group A Screen (Direct) POSITIVE (A) NEGATIVE  Influenza panel by PCR (type A & B)     Status: None   Collection Time: 01/11/17  6:25 PM  Result Value Ref Range   Influenza A By PCR NEGATIVE  NEGATIVE   Influenza B By PCR NEGATIVE NEGATIVE    MDM   Assessment and Plan  Strep throat  Plan Will prescribe Penicillin V 500 mg tid x 10 days for strep throat Take Tylenol 325 mg 2 tablets by mouth every 4 hours if needed for pain. May alternate with Motrin Throat sprays and losenges may help. Note for work to be out for today and 2 more days.  Currie Paris 01/11/2017, 8:57 PM

## 2017-01-11 NOTE — MAU Note (Signed)
Onset of lower abdominal pain and fever of 105 at [facility] ED, [facility] and 9 hour wait [place], LMP unsure. Had a baby 6/30

## 2017-01-11 NOTE — Discharge Instructions (Signed)
Get your medication in the pharmacy. Treat your fever with tylenol and motrin. Drink at least 8 8-oz glasses of water every day. Rest in bed for at least 2 days.

## 2017-01-11 NOTE — ED Notes (Signed)
PT left AMA due to wait time. RN have been made aware will take PT out of system

## 2017-01-11 NOTE — ED Triage Notes (Signed)
Pt reports she has had a sore throat and chills for the past few days. Has also had emesis. No diarrhea. No runny nose or cough.

## 2017-01-12 ENCOUNTER — Encounter: Payer: Self-pay | Admitting: Certified Nurse Midwife

## 2017-01-12 ENCOUNTER — Ambulatory Visit (INDEPENDENT_AMBULATORY_CARE_PROVIDER_SITE_OTHER): Payer: Medicaid Other | Admitting: Certified Nurse Midwife

## 2017-01-12 VITALS — BP 101/82 | HR 65 | Wt 166.2 lb

## 2017-01-12 DIAGNOSIS — Z3049 Encounter for surveillance of other contraceptives: Secondary | ICD-10-CM | POA: Diagnosis present

## 2017-01-12 DIAGNOSIS — Z30017 Encounter for initial prescription of implantable subdermal contraceptive: Secondary | ICD-10-CM

## 2017-01-12 DIAGNOSIS — Z1389 Encounter for screening for other disorder: Secondary | ICD-10-CM

## 2017-01-12 MED ORDER — ETONOGESTREL 68 MG ~~LOC~~ IMPL
68.0000 mg | DRUG_IMPLANT | Freq: Once | SUBCUTANEOUS | Status: AC
  Administered 2017-01-12: 68 mg via SUBCUTANEOUS

## 2017-01-12 NOTE — Progress Notes (Signed)
Subjective:     Tara Sanchez is a 26 y.o. female who presents for a postpartum visit. She is three months postpartum following a vaginal delivery 10/18/2016. I have fully reviewed the prenatal and intrapartum course. The delivery was at  gestational weeks. Outcome: vaginal. Anesthesia: none. Postpartum course has been uncomplicated. Baby's course has been uncomplicated. Baby is feeding by bottle. Bleeding normal. Bowel function is normal. Bladder function is normal. Patient is not sexually active. Contraception method is nothing at this time. Postpartum depression screening: negative The following portions of the patient's history were reviewed and updated as appropriate: allergies, current medications, past family history, past medical history, past social history, past surgical history and problem list.  Review of Systems Pertinent items are noted in HPI.   Objective:    BP 101/82   Pulse 65   Wt 166 lb 3.2 oz (75.4 kg)   BMI 26.03 kg/m   General:  alert, cooperative and no distress   Breasts:    Lungs: Reg rate and effort  Heart:  Reg rate  Abdomen:    Vulva:    Vagina:   Cervix:    Corpus:   Adnexa:    Rectal Exam:       Neuro: grossly normal  UPT negative 01/11/17 (MAU)   GYNECOLOGY CLINIC PROCEDURE NOTE  Tara Sanchez is a 26 y.o. Z6X0960 here for Nexplanon insertion.  Last pap smear was on 04/17/2016 and was normal. No other gynecologic concerns.  Nexplanon Insertion Procedure Patient identified, informed consent performed, consent signed. Patient does understand that irregular bleeding is a very common side effect of this medication. She was advised to have backup contraception for one week after placement. Pregnancy test in clinic today was negative. Appropriate time out taken. Patient's left arm was prepped and draped in the usual sterile fashion. Patient was prepped with alcohol swab and then injected with 3 ml of 1% lidocaine. She was prepped with betadine, Nexplanon  removed from packaging, Device confirmed in needle, then inserted full length of needle and withdrawn in left arm per handbook instructions. Nexplanon was able to palpated in the patient's arm; patient palpated the insert herself. There was minimal blood loss.  Patient insertion site covered with guaze and a pressure bandage to reduce any bruising.  The patient tolerated the procedure well and was given post procedure instructions.   Tara Sanchez, CNM [facility] [employer], [facility]   Assessment:      Normal postpartum exam. Pap smear not done at today's visit.  Nexplanon insertion Plan:      1. Contraception: Nexplanon 2. Follow up in: 1 months or as needed.

## 2017-01-16 ENCOUNTER — Telehealth: Payer: Self-pay | Admitting: General Practice

## 2017-01-16 NOTE — Telephone Encounter (Signed)
Patient called into front office stating she had the nexplanon inserted earlier this week and her arm is really bruised now. Told patient that is normal and should resolve soon. Patient verbalized understanding and states she has been having cramping lately and wonders if she could have an STD. Offered nurse visit to patient for Monday at 1115 for self swab. Patient states she cannot come till 4. Offered appt of 4 to patient. Patient verbalized understanding & had no questions

## 2017-01-19 ENCOUNTER — Ambulatory Visit: Payer: Self-pay

## 2017-02-13 ENCOUNTER — Ambulatory Visit: Payer: Self-pay | Admitting: Family Medicine

## 2017-04-25 IMAGING — US US MFM OB LIMITED
1 series · 12 of 12 positions shown · non-contrast
Comparison: none

[Series 1: us mfm ob limited · 12 of 12 slices shown]
[im 1/12]
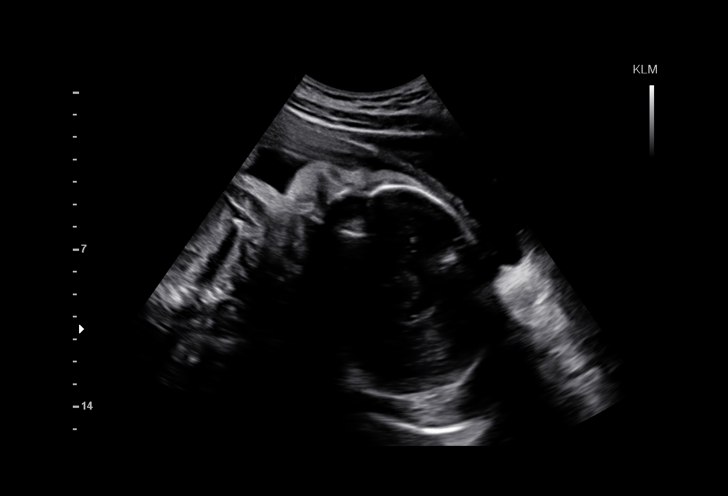
[im 2/12]
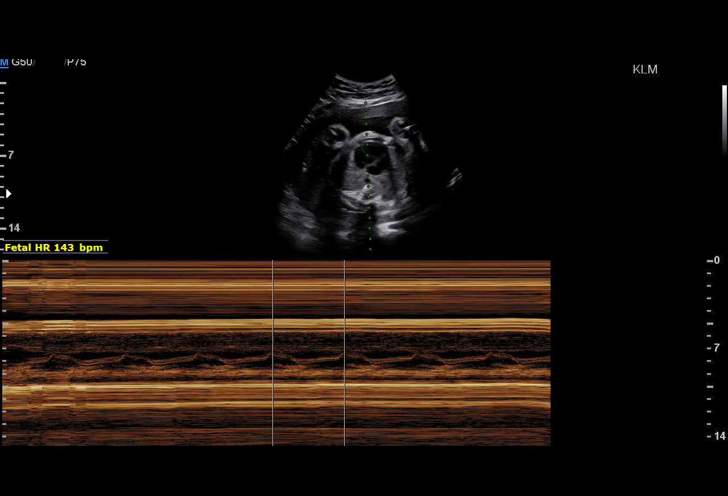
[im 3/12]
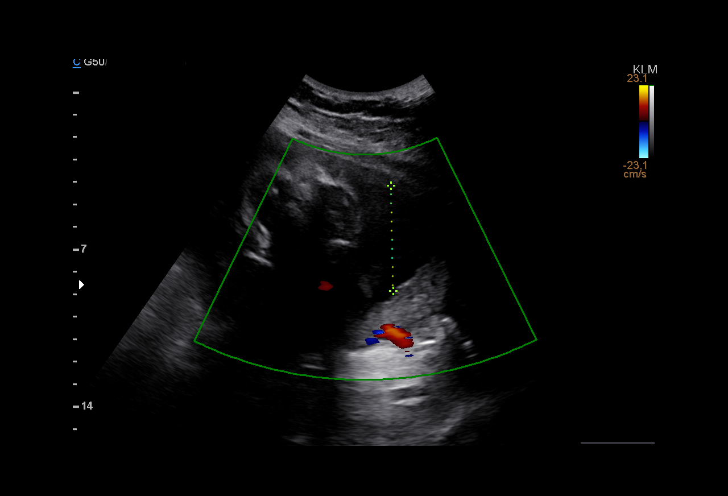
[im 4/12]
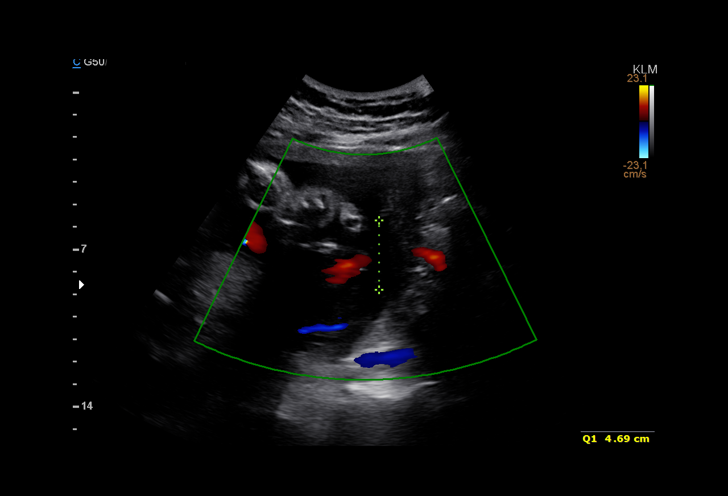
[im 5/12]
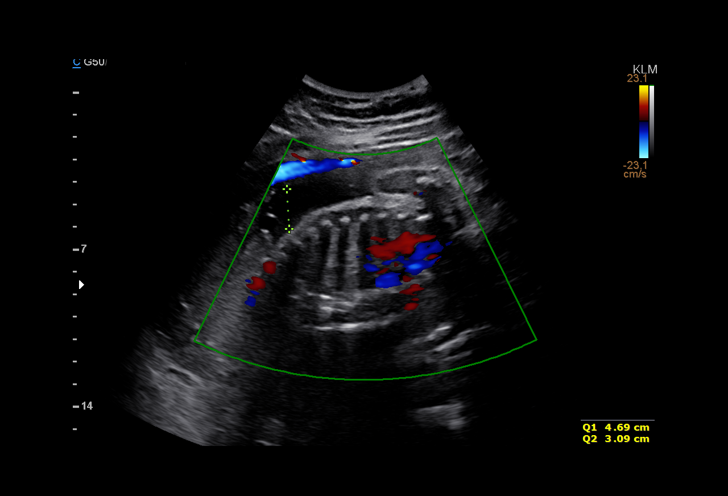
[im 6/12]
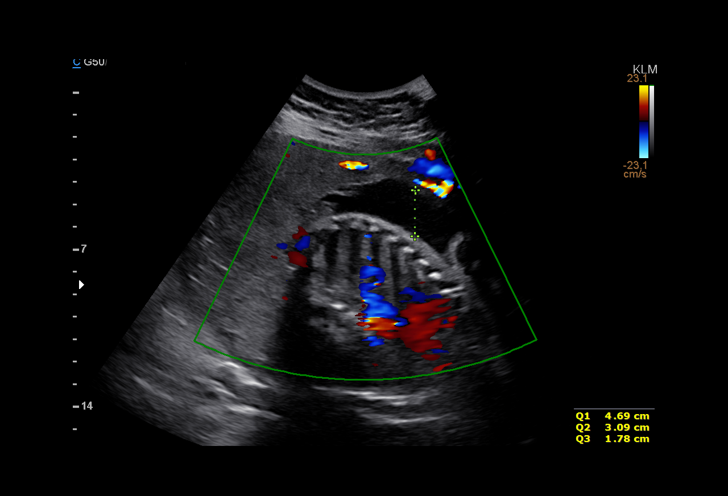
[im 7/12]
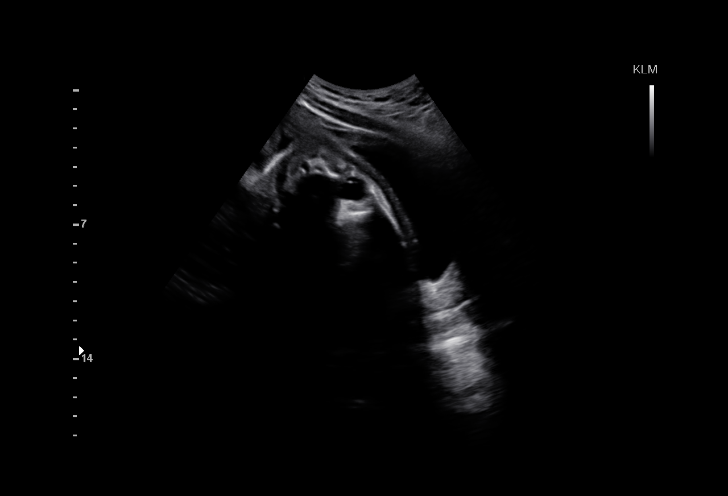
[im 8/12]
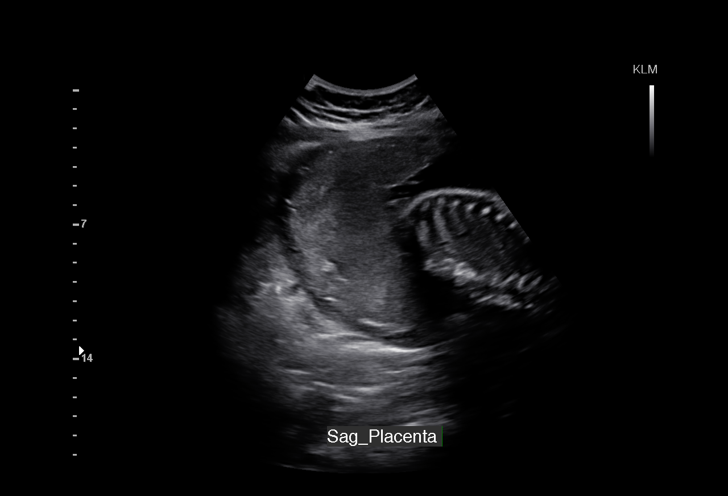
[im 9/12]
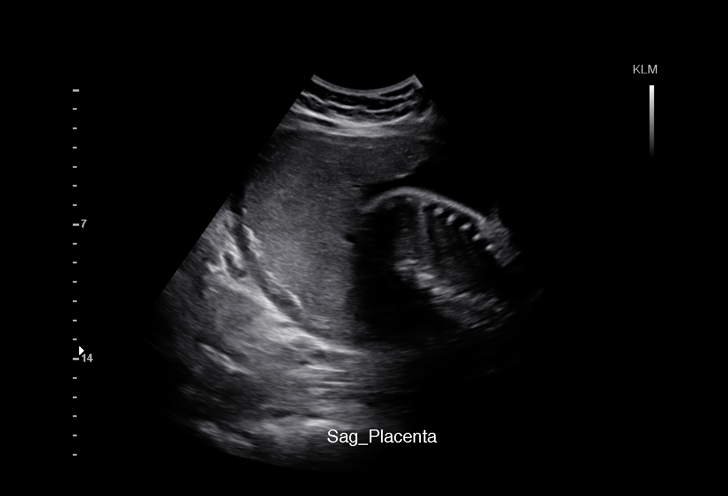
[im 10/12]
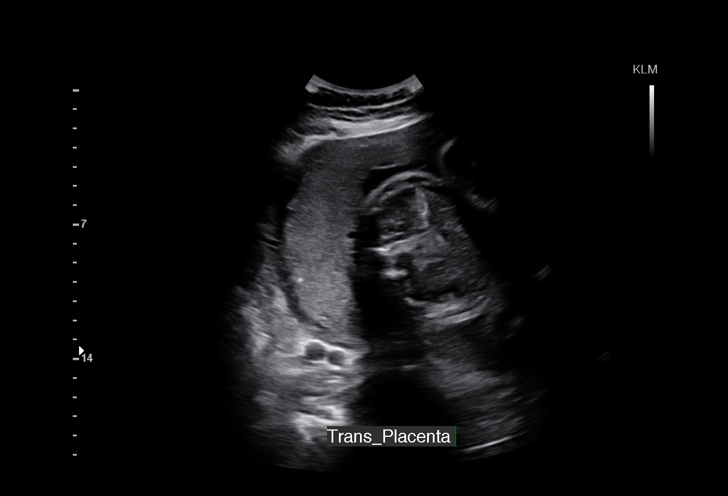
[im 11/12]
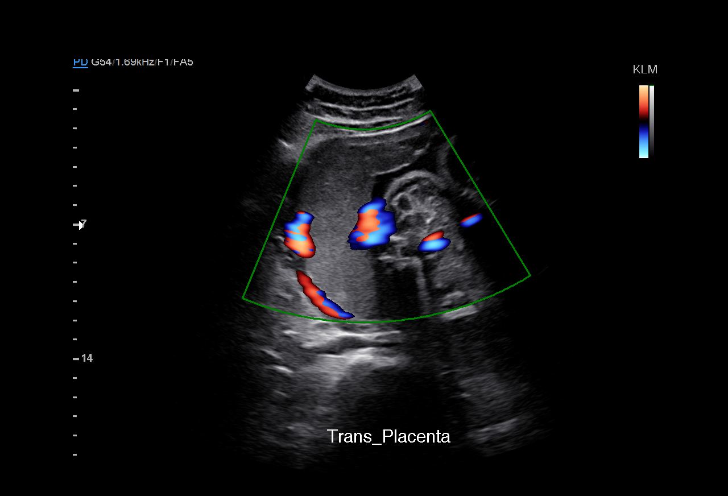
[im 12/12]
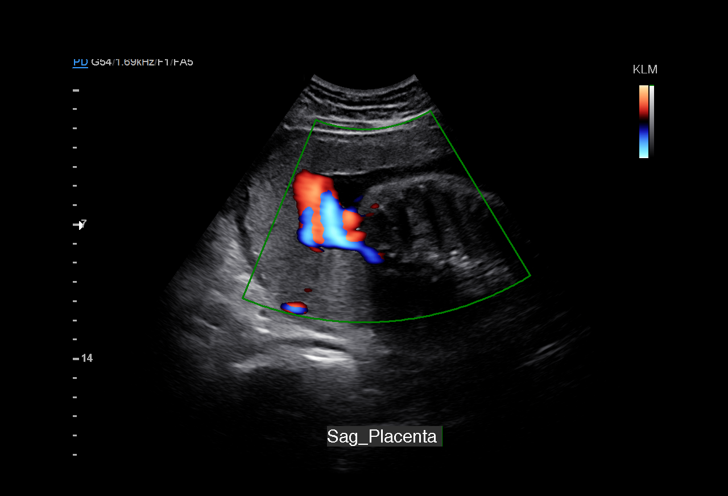

[12 of 12 positions shown; findings below may reference images not displayed]

am)

Date:

1  SEL LACROIX             [id number]       [id number]     [id number]
Indications

Determine fetal presentation using              Z36
ultrasound
32 weeks gestation of pregnancy
OB History

Gravidity:     4         Term:  1        Prem:    0        SAB:   0
TOP:           2       Ectopic  0        Living:  1
:
Fetal Evaluation

Num Of Fetuses:      1
Fetal Heart          143
Rate(bpm):
Cardiac Activity:    Observed
Presentation:        Cephalic
Placenta:            Fundal, above cervical os
P. Cord Insertion:   Visualized

Amniotic Fluid
AFI FV:      Subjectively within normal limits
AFI Sum:     11.61    cm      29  %Tile     Larg Pckt:    4.69   cm
RUQ:   4.69    cm    RLQ:   2.05    cm   LUQ:    3.09    cm   LLQ:    1.78   cm
Gestational Age

Clinical EDD:  32w 1d                                         EDD:   05/22/15
Best:          32w 1d    Det. By:   Clinical EDD              EDD:   05/22/15
Cervix Uterus Adnexa

Cervix
Not visualized (advanced GA >71wks)
Impression

Single IUP at 32w 1d
Limited ultrasound performed to determine fetal presentation
Cephalic presentation
Fundal placenta
Normal amniotic fluid volume
Recommendations

Follow-up ultrasounds as clinically indicated.

## 2017-07-22 DIAGNOSIS — N760 Acute vaginitis: Secondary | ICD-10-CM | POA: Diagnosis not present

## 2017-07-22 DIAGNOSIS — R8271 Bacteriuria: Secondary | ICD-10-CM | POA: Diagnosis not present

## 2017-07-22 DIAGNOSIS — Z3202 Encounter for pregnancy test, result negative: Secondary | ICD-10-CM | POA: Diagnosis not present

## 2017-08-25 ENCOUNTER — Encounter: Payer: Self-pay | Admitting: *Deleted

## 2017-09-03 ENCOUNTER — Other Ambulatory Visit: Payer: Self-pay

## 2017-09-03 ENCOUNTER — Encounter (HOSPITAL_COMMUNITY): Payer: Self-pay

## 2017-09-03 ENCOUNTER — Ambulatory Visit (HOSPITAL_COMMUNITY)
Admission: EM | Admit: 2017-09-03 | Discharge: 2017-09-03 | Disposition: A | Discharge status: Home / Self Care | Payer: BLUE CROSS/BLUE SHIELD | Attending: Family Medicine | Admitting: Family Medicine

## 2017-09-03 DIAGNOSIS — N3000 Acute cystitis without hematuria: Secondary | ICD-10-CM | POA: Diagnosis not present

## 2017-09-03 LAB — POCT URINALYSIS DIP (DEVICE)
GLUCOSE, UA: NEGATIVE mg/dL
KETONES UR: 40 mg/dL — AB
Nitrite: POSITIVE — AB
Specific Gravity, Urine: >=1.03 (ref 1.005–1.030)
Urobilinogen, UA: 1 mg/dL (ref 0.0–1.0)
pH: 6 (ref 5.0–8.0)

## 2017-09-03 LAB — POCT PREGNANCY, URINE: PREG TEST UR: NEGATIVE

## 2017-09-03 MED ORDER — CEPHALEXIN 500 MG PO CAPS: 500.0000 mg | ORAL_CAPSULE | Freq: Two times a day (BID) | ORAL | 0 refills | Status: AC

## 2017-09-03 NOTE — ED Provider Notes (Signed)
[facility]    CSN: [id number] Arrival date & time: 09/03/17  1704     History   Chief Complaint Chief Complaint  Patient presents with  . Urinary Tract Infection    HPI Tara Sanchez is a 27 y.o. female.   Austine presents with complaints of pain with urination as well as frequency which started this morning at 0200. She states she feels discomfort to low abdomen like it is "turning." no fevers or back pain. Denies vaginal symptoms. Does not have a period as she has a nexplanon. States has had UTI's in the past when she was pregnant.    ROS per HPI.      Past Medical History:  Diagnosis Date  . Genital herpes   . Headache   . History of preterm delivery, currently pregnant 04/17/2016   makena  . Infection    UTI    Patient Active Problem List   Diagnosis Date Noted  . Maternal varicella, non-immune 01/09/2015    Past Surgical History:  Procedure Laterality Date  . THERAPEUTIC ABORTION      OB History    Gravida  5   Para  3   Term  1   Preterm  2   AB  2   Living  3     SAB      TAB  2   Ectopic      Multiple  0   Live Births  3            Home Medications    Prior to Admission medications   Medication Sig Start Date End Date Taking? Authorizing Provider  cephALEXin (KEFLEX) 500 MG capsule Take 1 capsule (500 mg total) by mouth 2 (two) times daily for 7 days. 09/03/17 09/10/17  Georgetta Haber, NP    Family History Family History  Problem Relation Age of Onset  . Diabetes Paternal Grandmother   . Asthma Neg Hx   . Cancer Neg Hx   . Heart disease Neg Hx   . Hypertension Neg Hx   . Stroke Neg Hx   . Alcohol abuse Neg Hx   . Arthritis Neg Hx   . Birth defects Neg Hx   . COPD Neg Hx   . Depression Neg Hx   . Drug abuse Neg Hx   . Early death Neg Hx   . Hearing loss Neg Hx   . Hyperlipidemia Neg Hx   . Kidney disease Neg Hx   . Learning disabilities Neg Hx   . Mental illness Neg Hx   . Mental retardation Neg  Hx   . Miscarriages / Stillbirths Neg Hx   . Vision loss Neg Hx   . Varicose Veins Neg Hx     Social History Social History   Tobacco Use  . Smoking status: Former Smoker    Years: 2.00    Types: Cigars  . Smokeless tobacco: Former Engineer, water Use Topics  . Alcohol use: No    Comment: Social  . Drug use: No     Allergies   Macrobid [nitrofurantoin]; Phenergan [promethazine hcl]; and Tessalon [benzonatate]   Review of Systems Review of Systems   Physical Exam Triage Vital Signs ED Triage Vitals  Enc Vitals Group     BP 09/03/17 1750 107/70     Pulse Rate 09/03/17 1750 86     Resp 09/03/17 1750 17     Temp 09/03/17 1750 98.8 F (37.1 C)  Temp Source 09/03/17 1750 Oral     SpO2 09/03/17 1750 100 %     Weight --      Height --      Head Circumference --      Peak Flow --      Pain Score 09/03/17 1752 7     Pain Loc --      Pain Edu? --      Excl. in GC? --    No data found.  Updated Vital Signs BP 107/70 (BP Location: Left Arm)   Pulse 86   Temp 98.8 F (37.1 C) (Oral)   Resp 17   SpO2 100%    Physical Exam  Constitutional: She is oriented to person, place, and time. She appears well-developed and well-nourished. No distress.  Cardiovascular: Normal rate, regular rhythm and normal heart sounds.  Pulmonary/Chest: Effort normal and breath sounds normal.  Abdominal: Soft. Bowel sounds are normal. There is no tenderness. There is no rigidity, no rebound, no guarding and no CVA tenderness.  Neurological: She is alert and oriented to person, place, and time.  Skin: Skin is warm and dry.     UC Treatments / Results  Labs (all labs ordered are listed, but only abnormal results are displayed) Labs Reviewed  POCT URINALYSIS DIP (DEVICE) - Abnormal; Notable for the following components:      Result Value   Bilirubin Urine SMALL (*)    Ketones, ur 40 (*)    Hgb urine dipstick LARGE (*)    Protein, ur >=300 (*)    Nitrite POSITIVE (*)     Leukocytes, UA TRACE (*)    All other components within normal limits  URINE CULTURE  POCT PREGNANCY, URINE    EKG None  Radiology No results found.  Procedures Procedures (including critical care time)  Medications Ordered in UC Medications - No data to display  Initial Impression / Assessment and Plan / UC Course  I have reviewed the triage vital signs and the nursing notes.  Pertinent labs & imaging results that were available during my care of the patient were reviewed by me and considered in my medical decision making (see chart for details).    Urine dip and history consistent with uti. Keflex initiated. Culture obtained and pending. Push fluids. pcp assist request entered as per patient request. Return precautions provided. Patient verbalized understanding and agreeable to plan.  Ambulatory out of clinic without difficulty.     Final Clinical Impressions(s) / UC Diagnoses   Final diagnoses:  Acute cystitis without hematuria     Discharge Instructions     Drink plenty of water to ensure adequate hydration and empty bladder regularly. Complete course of antibiotics.   May use over the counter Azo to help with burning pain as needed. If symptoms worsen or do not improve in the next week to return to be seen or to follow up with your PCP.     ED Prescriptions    Medication Sig Dispense Auth. Provider   cephALEXin (KEFLEX) 500 MG capsule Take 1 capsule (500 mg total) by mouth 2 (two) times daily for 7 days. 14 capsule Georgetta Haber, NP     Controlled Substance Prescriptions [place] Controlled Substance Registry consulted? Not Applicable   Georgetta Haber, NP 09/03/17 [phone]

## 2017-09-03 NOTE — ED Triage Notes (Signed)
Patient presents to [facility] for UTI, pt complains of pressure during urination, and frequent urination, pt denies any odor or discharge

## 2017-09-03 NOTE — Discharge Instructions (Signed)
Drink plenty of water to ensure adequate hydration and empty bladder regularly. Complete course of antibiotics.   May use over the counter Azo to help with burning pain as needed. If symptoms worsen or do not improve in the next week to return to be seen or to follow up with your PCP.

## 2017-09-28 ENCOUNTER — Ambulatory Visit: Payer: BLUE CROSS/BLUE SHIELD | Admitting: Advanced Practice Midwife

## 2017-10-16 ENCOUNTER — Encounter: Payer: Self-pay | Admitting: Nurse Practitioner

## 2017-10-16 ENCOUNTER — Ambulatory Visit (INDEPENDENT_AMBULATORY_CARE_PROVIDER_SITE_OTHER): Payer: BLUE CROSS/BLUE SHIELD | Admitting: Nurse Practitioner

## 2017-10-16 VITALS — BP 111/68 | HR 96 | Ht 67.0 in | Wt 155.0 lb

## 2017-10-16 DIAGNOSIS — N898 Other specified noninflammatory disorders of vagina: Secondary | ICD-10-CM | POA: Diagnosis not present

## 2017-10-16 DIAGNOSIS — Z113 Encounter for screening for infections with a predominantly sexual mode of transmission: Secondary | ICD-10-CM

## 2017-10-16 DIAGNOSIS — Z3046 Encounter for surveillance of implantable subdermal contraceptive: Secondary | ICD-10-CM

## 2017-10-16 MED ORDER — ESTRADIOL 0.1 MG/GM VA CREA: TOPICAL_CREAM | VAGINAL | 2 refills | Status: DC

## 2017-10-16 NOTE — Patient Instructions (Signed)
Prescribing estrace vaginal cream. Use [facility] online for cheapest price. Sign up for My Chart

## 2017-10-16 NOTE — Progress Notes (Signed)
[facility]  GYNECOLOGY OFFICE VISIT NOTE   History:  27 y.o. [id number] here today for removal of her Nexplanon. She denies any abnormal vaginal discharge, bleeding, or pelvic pain.  Her biggest concern is vaginal dryness that she attributes to the Nexplanon and she wants it removed.  She has not tried any vaginal lubricants.  She just thinks it is not normal that she feels less lubricated now than before she had her last baby.  She has been having unprotected sex and with the Nexplanon she has had no pregnancies.  Discussed her reproductive life plan at length.  She does not want another child at this time.  Advised her that removal of the Nexplanon puts her at risk of a pregnancy beginning today.   Wants a pelvic exam and to be checked - needs to be reassured everthing is fine.  Does not douche.  Past Medical History:  Diagnosis Date  . Genital herpes   . Headache   . History of preterm delivery, currently pregnant 04/17/2016   makena  . Infection    UTI    Past Surgical History:  Procedure Laterality Date  . THERAPEUTIC ABORTION      The following portions of the patient's history were reviewed and updated as appropriate: allergies, current medications, past family history, past medical history, past social history, past surgical history and problem list.   Health Maintenance:  Normal pap on 04-22-16.    Review of Systems:  Pertinent items noted in HPI and remainder of comprehensive ROS otherwise negative.  Objective:  Physical Exam BP 111/68   Pulse 96   Ht 5\' 7"  (1.702 m)   Wt 155 lb (70.3 kg)   BMI 24.28 kg/m  CONSTITUTIONAL: Well-developed, well-nourished female in no acute distress.  HENT:  Normocephalic, atraumatic. External right and left ear normal. SKIN: Skin is warm and dry. No rash noted. Not diaphoretic. No erythema. No pallor. NEUROLOGIC: Alert and oriented to person, place, and time. Normal  muscle tone coordination. No cranial nerve deficit noted. PSYCHIATRIC: Normal  mood and affect. Normal behavior. Normal judgment and thought content. ABDOMEN: Soft, no distention noted.   PELVIC: Normal appearing external genitalia; normal appearing vaginal mucosa and cervix.  No abnormal discharge noted.  Normal uterine size, no other palpable masses, no uterine or adnexal tenderness. Vaginal is pink and is appropriately moist. MUSCULOSKELETAL: Normal range of motion. No edema noted.  Labs and Imaging No results found.  Assessment & Plan:  1.  Surveillance of subdermal implant contraceptive  2. Vaginal irritation  - Cervicovaginal ancillary only Labs pending.  Client reassured her vagina seems very normal.  Possible that she is "overthinking" her vagina is dry.  Advised we could remove Nexplanon but we needed to try to solve this problem for now and have her keep the Nexplanon for the excellent contraceptive benefit to her.  Client is in agreement for now.  Advised her to use a plain lubricant for intercourse - for example - Glide.  Client states she does not want to use lubricants - she does not like them.  Prescribed a single short course of estradiaol vaginal cream for her to try.  Attempted to prescribe generic.  Client reports not insurance.  Additionally discussed with her the normal female sexual response and needing as much as 20 minutes of slow foreplay for adequate lubrication.  Also discussed that relationship discord even minor irritations can contribute to the lack of lubrication.  Routine preventative health maintenance measures emphasized. Please refer to  After Visit Summary for other counseling recommendations.   No follow-ups on file.   Total face-to-face time with patient: 15 minutes.  Over 50% of encounter was spent on counseling and coordination of care.  Nolene Bernheim, RN, MSN, NP-BC Nurse Practitioner, [facility] [employer], [facility] 10/16/2017 3:57 PM

## 2017-10-19 LAB — CERVICOVAGINAL ANCILLARY ONLY
BACTERIAL VAGINITIS: POSITIVE — AB
CANDIDA VAGINITIS: NEGATIVE
Chlamydia: POSITIVE — AB
Neisseria Gonorrhea: NEGATIVE

## 2017-10-20 ENCOUNTER — Encounter: Payer: Self-pay | Admitting: Nurse Practitioner

## 2017-10-20 ENCOUNTER — Telehealth: Payer: Self-pay | Admitting: General Practice

## 2017-10-20 DIAGNOSIS — A749 Chlamydial infection, unspecified: Secondary | ICD-10-CM | POA: Insufficient documentation

## 2017-10-20 MED ORDER — AZITHROMYCIN 500 MG PO TABS: 1000.0000 mg | ORAL_TABLET | Freq: Once | ORAL | 0 refills | Status: AC

## 2017-10-20 MED ORDER — METRONIDAZOLE 500 MG PO TABS: 500.0000 mg | ORAL_TABLET | Freq: Two times a day (BID) | ORAL | 0 refills | Status: DC

## 2017-10-20 NOTE — Addendum Note (Signed)
Addended by: Currie ParisBURLESON, Sudie Bandel L on: 10/20/2017 08:25 AM   Modules accepted: Orders

## 2017-10-20 NOTE — Telephone Encounter (Signed)
Per chart review, patient tested + for chlamydia. Tara Sanchez has sent patient a Wellsite geologistmychart message. Called patient & message immediately states this person doesn't have a voicemail set up. Phone didn't ring. STD card completed.

## 2017-10-20 NOTE — Progress Notes (Signed)
Labs reviewed - positive for chlamydia and BV.  MyChart note sent to the client and medication sent to her pharmacy.  Nolene BernheimERRI BURLESON, RN, MSN, NP-BC Nurse Practitioner, [facility], [facility] 10/20/2017 8:24 AM

## 2017-11-01 ENCOUNTER — Encounter: Payer: Self-pay | Admitting: Nurse Practitioner

## 2017-11-02 ENCOUNTER — Other Ambulatory Visit: Payer: Self-pay | Admitting: General Practice

## 2017-11-02 DIAGNOSIS — B379 Candidiasis, unspecified: Secondary | ICD-10-CM

## 2017-11-02 MED ORDER — FLUCONAZOLE 150 MG PO TABS: 150.0000 mg | ORAL_TABLET | Freq: Once | ORAL | 0 refills | Status: AC

## 2017-11-24 ENCOUNTER — Ambulatory Visit (INDEPENDENT_AMBULATORY_CARE_PROVIDER_SITE_OTHER): Payer: BLUE CROSS/BLUE SHIELD | Admitting: Nurse Practitioner

## 2017-11-24 ENCOUNTER — Encounter: Payer: Self-pay | Admitting: Nurse Practitioner

## 2017-11-24 ENCOUNTER — Ambulatory Visit: Payer: Self-pay

## 2017-11-24 VITALS — BP 119/67 | HR 63 | Wt 146.7 lb

## 2017-11-24 DIAGNOSIS — R300 Dysuria: Secondary | ICD-10-CM

## 2017-11-24 DIAGNOSIS — Z202 Contact with and (suspected) exposure to infections with a predominantly sexual mode of transmission: Secondary | ICD-10-CM

## 2017-11-24 DIAGNOSIS — Z3046 Encounter for surveillance of implantable subdermal contraceptive: Secondary | ICD-10-CM

## 2017-11-24 DIAGNOSIS — Z113 Encounter for screening for infections with a predominantly sexual mode of transmission: Secondary | ICD-10-CM

## 2017-11-24 LAB — POCT URINALYSIS DIP (DEVICE)
BILIRUBIN URINE: NEGATIVE
Glucose, UA: NEGATIVE mg/dL
Ketones, ur: NEGATIVE mg/dL
NITRITE: NEGATIVE
Protein, ur: 30 mg/dL — AB
Specific Gravity, Urine: >=1.03 (ref 1.005–1.030)
Urobilinogen, UA: 1 mg/dL (ref 0.0–1.0)
pH: 6 (ref 5.0–8.0)

## 2017-11-24 NOTE — Progress Notes (Addendum)
GYNECOLOGY ENCOUNTER NOTE  Subjective:   Tara Sanchez is a 27 y.o. [phone] female here for a Nexplanon removal and to have her urine checked.  Current complaints: having a decreased flow and dribble at the end of urination.  Has not had sex in 4 weeks and reports she is not planning to have sex. Has condoms at home also.  Came in June and wanted the Nexplanon removed and now she comes again.  She is insistent that she wants it removed after having thein Nexplanon for one year.  Denies abnormal vaginal bleeding, discharge, pelvic pain, problems with intercourse or other gynecologic concerns.  Denies any fever or abdominal pain.  Of note:  Client had chlamydia in June and was treated.  Will check for reinfection again today.   Gynecologic History No LMP recorded. Patient has had an implant.  For removal today. Contraception: condoms Last Pap: 04-17-16. Results were: normal  Obstetric History OB History  Gravida Para Term Preterm AB Living  5 3 1 2 2 3   SAB TAB Ectopic Multiple Live Births  0 2 0 0 3    # Outcome Date GA Lbr Len/2nd Weight Sex Delivery Anes PTL Lv  5 Term 10/18/16 577w6d 02:45 / 00:06 5 lb 6.4 oz (2.449 kg) F Vag-Spont None  LIV  4 Preterm 04/26/15 638w2d 07:19 / 00:14 5 lb 6.8 oz (2.46 kg) M Vag-Spont EPI  LIV  3 Preterm 04/20/10 6727w4d   F Vag-Spont EPI  LIV  2 TAB 2010          1 TAB 2009            Past Medical History:  Diagnosis Date  . Genital herpes   . Headache   . History of preterm delivery, currently pregnant 04/17/2016   makena  . Infection    UTI    Past Surgical History:  Procedure Laterality Date  . THERAPEUTIC ABORTION      No current outpatient medications on file prior to visit.   No current facility-administered medications on file prior to visit.     Allergies  Allergen Reactions  . Macrobid [Nitrofurantoin] Anaphylaxis  . Phenergan [Promethazine Hcl] Anaphylaxis  . Tessalon [Benzonatate] Anaphylaxis    Social History    Socioeconomic History  . Marital status: Single    Spouse name: Not on file  . Number of children: Not on file  . Years of education: Not on file  . Highest education level: Not on file  Occupational History  . Not on file  Social Needs  . Financial resource strain: Not on file  . Food insecurity:    Worry: Not on file    Inability: Not on file  . Transportation needs:    Medical: Not on file    Non-medical: Not on file  Tobacco Use  . Smoking status: Former Smoker    Years: 2.00    Types: Cigars  . Smokeless tobacco: Former Engineer, waterUser  Substance and Sexual Activity  . Alcohol use: No    Comment: Social  . Drug use: No  . Sexual activity: Yes    Birth control/protection: Implant    Comment: Nexplanon  Lifestyle  . Physical activity:    Days per week: Not on file    Minutes per session: Not on file  . Stress: Not on file  Relationships  . Social connections:    Talks on phone: Not on file    Gets together: Not on file  Attends religious service: Not on file    Active member of club or organization: Not on file    Attends meetings of clubs or organizations: Not on file    Relationship status: Not on file  . Intimate partner violence:    Fear of current or ex partner: Not on file    Emotionally abused: Not on file    Physically abused: Not on file    Forced sexual activity: Not on file  Other Topics Concern  . Not on file  Social History Narrative  . Not on file    Family History  Problem Relation Age of Onset  . Diabetes Paternal Grandmother   . Asthma Neg Hx   . Cancer Neg Hx   . Heart disease Neg Hx   . Hypertension Neg Hx   . Stroke Neg Hx   . Alcohol abuse Neg Hx   . Arthritis Neg Hx   . Birth defects Neg Hx   . COPD Neg Hx   . Depression Neg Hx   . Drug abuse Neg Hx   . Early death Neg Hx   . Hearing loss Neg Hx   . Hyperlipidemia Neg Hx   . Kidney disease Neg Hx   . Learning disabilities Neg Hx   . Mental illness Neg Hx   . Mental  retardation Neg Hx   . Miscarriages / Stillbirths Neg Hx   . Vision loss Neg Hx   . Varicose Veins Neg Hx     The following portions of the patient's history were reviewed and updated as appropriate: allergies, current medications, past family history, past medical history, past social history, past surgical history and problem list.  Review of Systems Pertinent items noted in HPI and remainder of comprehensive ROS otherwise negative.   Objective:  BP 119/67   Pulse 63   Wt 146 lb 11.2 oz (66.5 kg)   BMI 22.98 kg/m  CONSTITUTIONAL: Well-developed, well-nourished female in no acute distress.  HENT:  Normocephalic, atraumatic, External right and left ear normal.  EYES: Conjunctivae and EOM are normal. Pupils are equal, round.  No scleral icterus.  NECK: Normal range of motion, supple, no masses.  Normal thyroid.  SKIN: Skin is warm and dry. No rash noted. Not diaphoretic. No erythema. No pallor. NEUROLOGIC: Alert and oriented to person, place, and time. Normal reflexes, muscle tone coordination. No cranial nerve deficit noted. PSYCHIATRIC: Normal mood and affect. Normal behavior. Normal judgment and thought content. CARDIOVASCULAR: Normal heart rate noted, regular rhythm RESPIRATORY: Clear to auscultation bilaterally. Effort and breath sounds normal, no problems with respiration noted. BREASTS: Symmetric in size. No masses, skin changes, nipple drainage, or lymphadenopathy. ABDOMEN: Soft, no distention noted.  No tenderness, rebound or guarding.  PELVIC: Normal appearing external genitalia; normal appearing vaginal mucosa and cervix.  No abnormal discharge noted.  Pap smear obtained.  Normal uterine size, no other palpable masses, no uterine or adnexal tenderness. MUSCULOSKELETAL: Normal range of motion. No tenderness.  No cyanosis, clubbing, or edema.    Assessment and Plan:  1. Dysuria Blood noted in urine - not on menses - having some extended small flow after urinating and is  concerned she has a UTI.  Went to the HD and was treated for chlamydia in June.  States she is no longer with her partner and last intercourse was 4 weeks ago/.  Will await culture results before treating.  - Urine Culture  - urinalysis today showed blood and specific gravity of 1.030.  Will await culture results to confirm infection before treating.  Will also check for GC/Chlam  2. Nexplanon removal  Patient identified, informed consent performed, consent signed.   Appropriate time out taken. Nexplanon site identified.  Area prepped in usual sterile fashon. One ml of 1% lidocaine was used to anesthetize the area at the distal end of the implant. A small stab incision was made right beside the implant on the distal portion.  The Nexplanon rod was grasped using hemostats and removed without difficulty.  There was minimal blood loss. There were no complications.  Steri-strips were applied over the small incision.  A pressure bandage was applied to reduce any bruising.  The patient tolerated the procedure well and was given post procedure instructions.  Patient is planning to use condoms or abstinence for contraception/attempt conception.  Please refer to After Visit Summary for other counseling recommendations.   Nolene Bernheim, RN, MSN, NP-BC Nurse Practitioner, [facility] for [employer], [facility] 11/24/2017 7:23 PM   UTI identified on urine culture.  Medication already sent to client's pharmacy.  Nolene Bernheim, RN, MSN, NP-BC Nurse Practitioner, [facility] [employer], [facility] 11/30/2017 8:18 AM

## 2017-11-26 LAB — GC/CHLAMYDIA PROBE AMP (~~LOC~~) NOT AT ARMC
Chlamydia: NEGATIVE
NEISSERIA GONORRHEA: NEGATIVE

## 2017-11-26 LAB — URINE CULTURE

## 2017-11-27 ENCOUNTER — Other Ambulatory Visit: Payer: Self-pay | Admitting: General Practice

## 2017-11-27 DIAGNOSIS — N390 Urinary tract infection, site not specified: Secondary | ICD-10-CM

## 2017-11-27 MED ORDER — SULFAMETHOXAZOLE-TRIMETHOPRIM 800-160 MG PO TABS: 1.0000 | ORAL_TABLET | Freq: Two times a day (BID) | ORAL | 0 refills | Status: AC

## 2017-11-27 MED ORDER — PHENAZOPYRIDINE HCL 100 MG PO TABS: 100.0000 mg | ORAL_TABLET | Freq: Three times a day (TID) | ORAL | 0 refills | Status: DC | PRN

## 2017-11-27 NOTE — Progress Notes (Unsigned)
Patient sent mychart message requesting medication. Reviewed results with Dr Adrian BlackwaterStinson who prescribes bactrim BID x 3 days & pyridium as needed. Per Dr Adrian BlackwaterStinson, bactrim should not be a problem with patient's allergies.

## 2017-12-29 ENCOUNTER — Encounter: Payer: Self-pay | Admitting: *Deleted

## 2018-01-26 ENCOUNTER — Inpatient Hospital Stay (HOSPITAL_COMMUNITY)
Admission: AD | Admit: 2018-01-26 | Discharge: 2018-01-26 | Disposition: A | Discharge status: Home / Self Care | Payer: Self-pay | Source: Ambulatory Visit | Attending: Obstetrics and Gynecology | Admitting: Obstetrics and Gynecology

## 2018-01-26 ENCOUNTER — Other Ambulatory Visit: Payer: Self-pay

## 2018-01-26 ENCOUNTER — Encounter (HOSPITAL_COMMUNITY): Payer: Self-pay | Admitting: *Deleted

## 2018-01-26 DIAGNOSIS — M545 Low back pain, unspecified: Secondary | ICD-10-CM

## 2018-01-26 DIAGNOSIS — Z3202 Encounter for pregnancy test, result negative: Secondary | ICD-10-CM | POA: Insufficient documentation

## 2018-01-26 HISTORY — DX: Other specified health status: Z78.9

## 2018-01-26 LAB — POCT PREGNANCY, URINE: Preg Test, Ur: NEGATIVE

## 2018-01-26 LAB — URINALYSIS, ROUTINE W REFLEX MICROSCOPIC
Bilirubin Urine: NEGATIVE
Glucose, UA: NEGATIVE mg/dL
Hgb urine dipstick: NEGATIVE
Ketones, ur: NEGATIVE mg/dL
Nitrite: NEGATIVE
Protein, ur: NEGATIVE mg/dL
Specific Gravity, Urine: 1.023 (ref 1.005–1.030)
pH: 7 (ref 5.0–8.0)

## 2018-01-26 NOTE — Progress Notes (Signed)
Pt left prior to receiving discharge instructions.

## 2018-01-26 NOTE — MAU Provider Note (Signed)
History    CSN: [id number]  Arrival date and time: 01/26/18 4098   First Provider Initiated Contact with Patient 01/26/18 0840      Chief Complaint  Patient presents with  . Back Pain   Ms. Killian presents with a 3-day history of back pain. She states that the pain started spontaneously and did not follow any trauma or strenuous exercise. Pain is located in bilateral lower back and feels achy in character.  The intensity ranges from 6-7/10.  The pain was worse last night while lying down and feels a little better this morning, but she cannot identify any specific exacerbating or alleviating factors.  She denies burning with urination, urinary frequency, hesitancy or incontinence. She also denies respiratory complaints including cough, shortness of breath, pain with deep breathing.   She reports that she is concerned because she missed her last period, which should have been here at the end of September and hasn't come yet. She reports that she was last sexually active mid-September, and hasn't had a period since then. She also reports that she recently had a nexplanon taken out, but has had normal periods since it's removal until now.    Pertinent Gynecological History: Menses: regular every month without intermenstrual spotting Contraception: none DES exposure: unknown Sexually transmitted diseases: currently at risk  Past Medical History:  Diagnosis Date  . Medical history non-contributory     Past Surgical History:  Procedure Laterality Date  . INDUCED ABORTION      History reviewed. No pertinent family history.  Social History   Tobacco Use  . Smoking status: Never Smoker  . Smokeless tobacco: Never Used  Substance Use Topics  . Alcohol use: Never    Frequency: Never  . Drug use: Not Currently    Types: Marijuana    Comment: September 2019    Allergies:  Allergies  Allergen Reactions  . Macrobid [Nitrofurantoin Macrocrystal] Anaphylaxis    No medications  prior to admission.    Review of Systems  Constitutional: Negative for fatigue and fever.  HENT: Negative for congestion and rhinorrhea.   Respiratory: Negative for cough and shortness of breath.   Cardiovascular: Negative for chest pain and palpitations.  Gastrointestinal: Negative for diarrhea, nausea and vomiting.  Genitourinary: Positive for menstrual problem. Negative for difficulty urinating, dysuria, frequency, pelvic pain, vaginal discharge and vaginal pain.   Physical Exam   Blood pressure 122/67, pulse 70, temperature 98.1 F (36.7 C), temperature source Oral, resp. rate 20, height 5\' 7"  (1.702 m), weight 66.6 kg, last menstrual period 12/15/2017, SpO2 100 %.  Physical Exam  Constitutional: She is oriented to person, place, and time. She appears well-developed and well-nourished. No distress.  HENT:  Head: Normocephalic and atraumatic.  Eyes: Right eye exhibits no discharge. Left eye exhibits no discharge. No scleral icterus.  Cardiovascular: Normal rate, regular rhythm and normal heart sounds. Exam reveals no gallop and no friction rub.  No murmur heard. Respiratory: Effort normal and breath sounds normal. No respiratory distress. She has no wheezes. She has no rales.  Musculoskeletal:       Lumbar back: She exhibits tenderness (mildly tender to palpation in bilateral lumbar musculature. no pain with palpation of lumbar vertebral bodies or paraspinal muscles. ). She exhibits no bony tenderness, no deformity, no laceration and no spasm.  Neurological: She is alert and oriented to person, place, and time.  Skin: Skin is warm and dry. No rash noted. She is not diaphoretic. No erythema.  Psychiatric: She has a  normal mood and affect. Her behavior is normal. Judgment and thought content normal.    MAU Course  Procedures   Assessment and Plan  Low back pain - likely MSK given UA negative for nitrites and absence of dysuria - discussed results of UA with patient given  positive leukocytes, who does not think this is a UTI - also discussed that UA not clean catch given 11-20 squamous cells - reviewed negative pregnancy test results, however encouraged pt to avoid teratogens until next menses or to repeat pregnancy test in 2-4 weeks if no menses - pt declined STD testing; reports that she was recently tested and will be retested soon since she routinely gets tested every few months - recommend tylenol + NSAIDs for back pain and follow up with PCP - ER/return precautions discussed - Follow up PRN   Gwenevere Abbot 01/26/2018, 9:03 AM

## 2018-01-26 NOTE — MAU Note (Signed)
Pt presents with c/o lower back pain x3 days, but worsened last night.  States LMP 12/15/2017. HPT negative 2 weeks ago.

## 2018-01-26 NOTE — Discharge Instructions (Signed)

## 2018-01-27 ENCOUNTER — Encounter: Payer: Self-pay | Admitting: Nurse Practitioner

## 2018-02-01 DIAGNOSIS — F3181 Bipolar II disorder: Secondary | ICD-10-CM | POA: Diagnosis not present

## 2018-02-03 DIAGNOSIS — F3181 Bipolar II disorder: Secondary | ICD-10-CM | POA: Diagnosis not present

## 2018-02-16 DIAGNOSIS — F3181 Bipolar II disorder: Secondary | ICD-10-CM | POA: Diagnosis not present

## 2018-03-03 ENCOUNTER — Telehealth: Payer: Self-pay

## 2018-03-03 DIAGNOSIS — B009 Herpesviral infection, unspecified: Secondary | ICD-10-CM

## 2018-03-03 MED ORDER — VALACYCLOVIR HCL 1 G PO TABS: 1000.0000 mg | ORAL_TABLET | Freq: Every day | ORAL | 11 refills | Status: DC

## 2018-03-03 MED ORDER — VALACYCLOVIR HCL 500 MG PO TABS: 500.0000 mg | ORAL_TABLET | Freq: Two times a day (BID) | ORAL | 0 refills | Status: DC

## 2018-03-03 NOTE — Telephone Encounter (Signed)
Called pt to verify if she was having an outbreak so I could send Rx to pharmacy. Pt verified she was. Verified pts pharmacy and sent the rx to pharmacy.

## 2018-03-03 NOTE — Telephone Encounter (Signed)
Pt called requesting a refill on her Valtrex.

## 2018-03-04 DIAGNOSIS — F3181 Bipolar II disorder: Secondary | ICD-10-CM | POA: Diagnosis not present

## 2018-03-16 ENCOUNTER — Ambulatory Visit (INDEPENDENT_AMBULATORY_CARE_PROVIDER_SITE_OTHER): Payer: BLUE CROSS/BLUE SHIELD | Admitting: *Deleted

## 2018-03-16 ENCOUNTER — Inpatient Hospital Stay (HOSPITAL_COMMUNITY)
Admission: AD | Admit: 2018-03-16 | Discharge: 2018-03-16 | Disposition: A | Discharge status: Home / Self Care | Payer: BLUE CROSS/BLUE SHIELD | Attending: Obstetrics and Gynecology | Admitting: Obstetrics and Gynecology

## 2018-03-16 DIAGNOSIS — Z3201 Encounter for pregnancy test, result positive: Secondary | ICD-10-CM

## 2018-03-16 LAB — POCT PREGNANCY, URINE: Preg Test, Ur: POSITIVE — AB

## 2018-03-16 NOTE — Progress Notes (Signed)
Pt here for pregnancy test.  Pregnancy test resulted positive.  Pt unsure of LMP.  Reports some time in October.  LMP: 01/26/18 EDD:11/02/18 GA:[id number] Discussed with Luna KitchensKathryn Kooistra CNM.  Verbal order placed for U/S for dating.   U/S scheduled for Tuesday 03/30/18 @ 1000.  [facility]  Pt verbalized understanding. Reviewed allergies and medications. Recommended pt prenatal vitamins and begin prenatal care.  Handout of medications safe during pregnancy given.  Proof of pregnancy letter provided by front office staff.

## 2018-03-16 NOTE — Progress Notes (Signed)
Chart reviewed for nurse visit. Agree with plan of care.   Marylene LandKooistra,  [place], CNM 03/16/2018 1:49 PM

## 2018-03-19 ENCOUNTER — Inpatient Hospital Stay (HOSPITAL_COMMUNITY)
Admission: AD | Admit: 2018-03-19 | Discharge: 2018-03-19 | Disposition: A | Discharge status: Home / Self Care | Payer: BLUE CROSS/BLUE SHIELD | Attending: Obstetrics & Gynecology | Admitting: Obstetrics & Gynecology

## 2018-03-19 ENCOUNTER — Other Ambulatory Visit: Payer: Self-pay

## 2018-03-19 ENCOUNTER — Inpatient Hospital Stay (HOSPITAL_COMMUNITY): Payer: BLUE CROSS/BLUE SHIELD

## 2018-03-19 ENCOUNTER — Encounter (HOSPITAL_COMMUNITY): Payer: Self-pay | Admitting: *Deleted

## 2018-03-19 DIAGNOSIS — O208 Other hemorrhage in early pregnancy: Secondary | ICD-10-CM | POA: Insufficient documentation

## 2018-03-19 DIAGNOSIS — N939 Abnormal uterine and vaginal bleeding, unspecified: Secondary | ICD-10-CM | POA: Diagnosis not present

## 2018-03-19 DIAGNOSIS — O418X10 Other specified disorders of amniotic fluid and membranes, first trimester, not applicable or unspecified: Secondary | ICD-10-CM | POA: Diagnosis not present

## 2018-03-19 DIAGNOSIS — Z3A01 Less than 8 weeks gestation of pregnancy: Secondary | ICD-10-CM | POA: Diagnosis not present

## 2018-03-19 DIAGNOSIS — O209 Hemorrhage in early pregnancy, unspecified: Secondary | ICD-10-CM

## 2018-03-19 DIAGNOSIS — O468X1 Other antepartum hemorrhage, first trimester: Secondary | ICD-10-CM

## 2018-03-19 DIAGNOSIS — O26891 Other specified pregnancy related conditions, first trimester: Secondary | ICD-10-CM | POA: Diagnosis not present

## 2018-03-19 LAB — COMPREHENSIVE METABOLIC PANEL
ALT: 14 U/L (ref 0–44)
AST: 12 U/L — AB (ref 15–41)
Albumin: 3.7 g/dL (ref 3.5–5.0)
Alkaline Phosphatase: 80 U/L (ref 38–126)
Anion gap: 7 (ref 5–15)
BUN: 14 mg/dL (ref 6–20)
CO2: 24 mmol/L (ref 22–32)
Calcium: 8.9 mg/dL (ref 8.9–10.3)
Chloride: 105 mmol/L (ref 98–111)
Creatinine, Ser: 0.59 mg/dL (ref 0.44–1.00)
GFR calc Af Amer: >60 mL/min (ref >60)
GFR calc non Af Amer: >60 mL/min (ref >60)
Glucose, Bld: 85 mg/dL (ref 70–99)
Potassium: 3.5 mmol/L (ref 3.5–5.1)
Sodium: 136 mmol/L (ref 135–145)
Total Bilirubin: 0.4 mg/dL (ref 0.3–1.2)
Total Protein: 6.6 g/dL (ref 6.5–8.1)

## 2018-03-19 LAB — URINALYSIS, ROUTINE W REFLEX MICROSCOPIC
Bilirubin Urine: NEGATIVE
GLUCOSE, UA: NEGATIVE mg/dL
HGB URINE DIPSTICK: NEGATIVE
KETONES UR: NEGATIVE mg/dL
Leukocytes, UA: NEGATIVE
Nitrite: NEGATIVE
PH: 5 (ref 5.0–8.0)
PROTEIN: NEGATIVE mg/dL
Specific Gravity, Urine: 1.021 (ref 1.005–1.030)

## 2018-03-19 LAB — CBC WITH DIFFERENTIAL/PLATELET
BASOS PCT: 0 %
Basophils Absolute: 0 10*3/uL (ref 0.0–0.1)
EOS ABS: 0.1 10*3/uL (ref 0.0–0.5)
EOS PCT: 1 %
HCT: 36.3 % (ref 36.0–46.0)
Hemoglobin: 12.2 g/dL (ref 12.0–15.0)
Lymphocytes Relative: 37 %
Lymphs Abs: 4 10*3/uL (ref 0.7–4.0)
MCH: 30.9 pg (ref 26.0–34.0)
MCHC: 33.6 g/dL (ref 30.0–36.0)
MCV: 91.9 fL (ref 80.0–100.0)
Monocytes Absolute: 0.5 10*3/uL (ref 0.1–1.0)
Monocytes Relative: 4 %
Neutro Abs: 6.2 10*3/uL (ref 1.7–7.7)
Neutrophils Relative %: 58 %
Platelets: 219 10*3/uL (ref 150–400)
RBC: 3.95 MIL/uL (ref 3.87–5.11)
RDW: 13.3 % (ref 11.5–15.5)
WBC: 10.9 10*3/uL — ABNORMAL HIGH (ref 4.0–10.5)
nRBC: 0 % (ref 0.0–0.2)

## 2018-03-19 LAB — WET PREP, GENITAL
Clue Cells Wet Prep HPF POC: NONE SEEN
Sperm: NONE SEEN
Trich, Wet Prep: NONE SEEN
Yeast Wet Prep HPF POC: NONE SEEN

## 2018-03-19 LAB — HCG, QUANTITATIVE, PREGNANCY: hCG, Beta Chain, Quant, S: 11428 m[IU]/mL — ABNORMAL HIGH (ref <5)

## 2018-03-19 NOTE — Discharge Instructions (Signed)
Vaginal Bleeding During Pregnancy, First Trimester °A small amount of bleeding (spotting) from the vagina is common in early pregnancy. Sometimes the bleeding is normal and is not a problem, and sometimes it is a sign of something serious. Be sure to tell your doctor about any bleeding from your vagina right away. °Follow these instructions at home: °· Watch your condition for any changes. °· Follow your doctor's instructions about how active you can be. °· If you are on bed rest: °? You may need to stay in bed and only get up to use the bathroom. °? You may be allowed to do some activities. °? If you need help, make plans for someone to help you. °· Write down: °? The number of pads you use each day. °? How often you change pads. °? How soaked (saturated) your pads are. °· Do not use tampons. °· Do not douche. °· Do not have sex or orgasms until your doctor says it is okay. °· If you pass any tissue from your vagina, save the tissue so you can show it to your doctor. °· Only take medicines as told by your doctor. °· Do not take aspirin because it can make you bleed. °· Keep all follow-up visits as told by your doctor. °Contact a doctor if: °· You bleed from your vagina. °· You have cramps. °· You have labor pains. °· You have a fever that does not go away after you take medicine. °Get help right away if: °· You have very bad cramps in your back or belly (abdomen). °· You pass large clots or tissue from your vagina. °· You bleed more. °· You feel light-headed or weak. °· You pass out (faint). °· You have chills. °· You are leaking fluid or have a gush of fluid from your vagina. °· You pass out while pooping (having a bowel movement). °This information is not intended to replace advice given to you by your health care provider. Make sure you discuss any questions you have with your health care provider. °Document Released: 08/22/2013 Document Revised: 09/13/2015 Document Reviewed: 12/13/2012 °Elsevier Interactive  Patient Education © 2018 Elsevier Inc. ° °Subchorionic Hematoma °A subchorionic hematoma is a gathering of blood between the outer wall of the placenta and the inner wall of the womb (uterus). The placenta is the organ that connects the fetus to the wall of the uterus. The placenta performs the feeding, breathing (oxygen to the fetus), and waste removal (excretory work) of the fetus. °Subchorionic hematoma is the most common abnormality found on a result from ultrasonography done during the first trimester or early second trimester of pregnancy. If there has been little or no vaginal bleeding, early small hematomas usually shrink on their own and do not affect your baby or pregnancy. The blood is gradually absorbed over 1-2 weeks. When bleeding starts later in pregnancy or the hematoma is larger or occurs in an older pregnant woman, the outcome may not be as good. Larger hematomas may get bigger, which increases the chances for miscarriage. Subchorionic hematoma also increases the risk of premature detachment of the placenta from the uterus, preterm (premature) labor, and stillbirth. °Follow these instructions at home: °· Stay on bed rest if your health care provider recommends this. Although bed rest will not prevent more bleeding or prevent a miscarriage, your health care provider may recommend bed rest until you are advised otherwise. °· Avoid heavy lifting (more than 10 lb [4.5 kg]), exercise, sexual intercourse, or douching as directed by   your health care provider.  Keep track of the number of pads you use each day and how soaked (saturated) they are. Write down this information.  Do not use tampons.  Keep all follow-up appointments as directed by your health care provider. Your health care provider may ask you to have follow-up blood tests or ultrasound tests or both. Get help right away if:  You have severe cramps in your stomach, back, abdomen, or pelvis.  You have a fever.  You pass large clots  or tissue. Save any tissue for your health care provider to look at.  Your bleeding increases or you become lightheaded, feel weak, or have fainting episodes. This information is not intended to replace advice given to you by your health care provider. Make sure you discuss any questions you have with your health care provider. Document Released: 07/23/2006 Document Revised: 09/13/2015 Document Reviewed: 11/04/2012 Elsevier Interactive Patient Education  2017 [employer]

## 2018-03-19 NOTE — MAU Provider Note (Signed)
History     CSN: [id number]  Arrival date and time: 03/19/18 1813   First Provider Initiated Contact with Patient 03/19/18 1915      Chief Complaint  Patient presents with  . Vaginal Bleeding  . Back Pain   HPI  Ms.Tara Sanchez is a 27 y.o. female [phone] @ 236w2d here in MAU with complaints of 2 episodes of vaginal bleeding today; no bleeding now. No abdominal pain. Says she has had some lower back pain off and on all day. She has tried taking tylenol which did not help. The pain is located along the lower part of her back. No fever.   OB History    Gravida  6   Para  3   Term  1   Preterm  2   AB  2   Living  3     SAB  0   TAB  2   Ectopic  0   Multiple      Live Births  3           Past Medical History:  Diagnosis Date  . Genital herpes   . Headache   . History of preterm delivery, currently pregnant 04/17/2016   makena  . Infection    UTI  . Medical history non-contributory     Past Surgical History:  Procedure Laterality Date  . INDUCED ABORTION    . THERAPEUTIC ABORTION      Family History  Problem Relation Age of Onset  . Diabetes Paternal Grandmother   . Asthma Neg Hx   . Cancer Neg Hx   . Heart disease Neg Hx   . Hypertension Neg Hx   . Stroke Neg Hx   . Alcohol abuse Neg Hx   . Arthritis Neg Hx   . Birth defects Neg Hx   . COPD Neg Hx   . Depression Neg Hx   . Drug abuse Neg Hx   . Early death Neg Hx   . Hearing loss Neg Hx   . Hyperlipidemia Neg Hx   . Kidney disease Neg Hx   . Learning disabilities Neg Hx   . Mental illness Neg Hx   . Mental retardation Neg Hx   . Miscarriages / Stillbirths Neg Hx   . Vision loss Neg Hx   . Varicose Veins Neg Hx     Social History   Tobacco Use  . Smoking status: Never Smoker  . Smokeless tobacco: Never Used  Substance Use Topics  . Alcohol use: Never    Frequency: Never    Comment: Social  . Drug use: Not Currently    Types: Marijuana    Comment: September 2019     Allergies:  Allergies  Allergen Reactions  . Macrobid [Nitrofurantoin Macrocrystal] Anaphylaxis  . Macrobid [Nitrofurantoin] Anaphylaxis  . Phenergan [Promethazine Hcl] Anaphylaxis  . Tessalon [Benzonatate] Anaphylaxis    Medications Prior to Admission  Medication Sig Dispense Refill Last Dose  . valACYclovir (VALTREX) 500 MG tablet Take 1 tablet (500 mg total) by mouth 2 (two) times daily. Take this Rx 1st (Patient not taking: Reported on 03/16/2018) 6 tablet 0 Not Taking   Results for orders placed or performed during the hospital encounter of 03/19/18 (from the past 48 hour(s))  Urinalysis, Routine w reflex microscopic     Status: None   Collection Time: 03/19/18  6:44 PM  Result Value Ref Range   Color, Urine YELLOW YELLOW   APPearance CLEAR CLEAR   Specific Gravity,  Urine 1.021 1.005 - 1.030   pH 5.0 5.0 - 8.0   Glucose, UA NEGATIVE NEGATIVE mg/dL   Hgb urine dipstick NEGATIVE NEGATIVE   Bilirubin Urine NEGATIVE NEGATIVE   Ketones, ur NEGATIVE NEGATIVE mg/dL   Protein, ur NEGATIVE NEGATIVE mg/dL   Nitrite NEGATIVE NEGATIVE   Leukocytes, UA NEGATIVE NEGATIVE    Comment: Performed at [facility], [address], [place], [place] [address]  Wet prep, genital     Status: Abnormal   Collection Time: 03/19/18  7:27 PM  Result Value Ref Range   Yeast Wet Prep HPF POC NONE SEEN NONE SEEN   Trich, Wet Prep NONE SEEN NONE SEEN   Clue Cells Wet Prep HPF POC NONE SEEN NONE SEEN   WBC, Wet Prep HPF POC FEW (A) NONE SEEN    Comment: MANY BACTERIA SEEN   Sperm NONE SEEN     Comment: Performed at [facility], [address] [place], [place] [address]  CBC with Differential/Platelet     Status: Abnormal   Collection Time: 03/19/18  7:30 PM  Result Value Ref Range   WBC 10.9 (H) 4.0 - 10.5 K/uL   RBC 3.95 3.87 - 5.11 MIL/uL   Hemoglobin 12.2 12.0 - 15.0 g/dL   HCT 09.8 11.9 - 14.7 %   MCV 91.9 80.0 - 100.0 fL   MCH 30.9 26.0 - 34.0 pg   MCHC 33.6 30.0 - 36.0 g/dL    RDW 82.9 56.2 - 13.0 %   Platelets 219 150 - 400 K/uL   nRBC 0.0 0.0 - 0.2 %   Neutrophils Relative % 58 %   Neutro Abs 6.2 1.7 - 7.7 K/uL   Lymphocytes Relative 37 %   Lymphs Abs 4.0 0.7 - 4.0 K/uL   Monocytes Relative 4 %   Monocytes Absolute 0.5 0.1 - 1.0 K/uL   Eosinophils Relative 1 %   Eosinophils Absolute 0.1 0.0 - 0.5 K/uL   Basophils Relative 0 %   Basophils Absolute 0.0 0.0 - 0.1 K/uL    Comment: Performed at [facility], [address], [place], [place] [address]  Comprehensive metabolic panel     Status: Abnormal   Collection Time: 03/19/18  7:30 PM  Result Value Ref Range   Sodium 136 135 - 145 mmol/L   Potassium 3.5 3.5 - 5.1 mmol/L   Chloride 105 98 - 111 mmol/L   CO2 24 22 - 32 mmol/L   Glucose, Bld 85 70 - 99 mg/dL   BUN 14 6 - 20 mg/dL   Creatinine, Ser 4.69 0.44 - 1.00 mg/dL   Calcium 8.9 8.9 - 62.9 mg/dL   Total Protein 6.6 6.5 - 8.1 g/dL   Albumin 3.7 3.5 - 5.0 g/dL   AST 12 (L) 15 - 41 U/L   ALT 14 0 - 44 U/L   Alkaline Phosphatase 80 38 - 126 U/L   Total Bilirubin 0.4 0.3 - 1.2 mg/dL   GFR calc non Af Amer >60 >60 mL/min   GFR calc Af Amer >60 >60 mL/min   Anion gap 7 5 - 15    Comment: Performed at [facility], [address], [place], [place] [address]   US Ob Less Than 14 Weeks With Ob Transvaginal  Result Date: 03/19/2018 CLINICAL DATA:  Six weeks pregnant, vaginal bleeding EXAM: OBSTETRIC <14 WK [name] AND TRANSVAGINAL OB US TECHNIQUE: Both transabdominal and transvaginal ultrasound examinations were performed for complete evaluation of the gestation as well as the maternal uterus, adnexal regions,  and pelvic cul-de-sac. Transvaginal technique was performed to assess early pregnancy. COMPARISON:  None. FINDINGS: Intrauterine gestational sac: Single intrauterine pregnancy Yolk sac:  Visible Embryo:  Visible Cardiac Activity: Visible Heart Rate: 110 bpm CRL: 2.7 mm   5 w   5 d                  [name] EDC: 11/14/2018 Subchorionic hemorrhage: Small  subchorionic hemorrhage along the inferior sac. Maternal uterus/adnexae: Ovaries are within normal limits. Left ovary measures 4.6 x 3.2 x 3.2 cm. Right ovary measures 3.2 x 1.4 x 2.3 cm. No significant free fluid. IMPRESSION: Single viable intrauterine pregnancy as above. Small subchorionic hemorrhage. Electronically Signed   By: Jasmine Pang M.D.   On: 03/19/2018 20:00   Review of Systems  Constitutional: Negative for fever.  Gastrointestinal: Negative for abdominal pain.  Genitourinary: Negative for dysuria and flank pain.  Musculoskeletal: Positive for back pain.   Physical Exam   Blood pressure 112/69, pulse 82, temperature 98.2 F (36.8 C), temperature source Oral, resp. rate 17, weight 72.3 kg, last menstrual period 02/03/2018, SpO2 100 %, unknown if currently breastfeeding.  Physical Exam  Constitutional: She is oriented to person, place, and time. She appears well-developed and well-nourished. No distress.  HENT:  Head: Normocephalic.  Eyes: Pupils are equal, round, and reactive to light.  Neck: Neck supple.  GI: Soft. She exhibits no distension. There is no tenderness. There is no rebound.  Genitourinary:  Genitourinary Comments: Vagina - Small amount of white vaginal discharge, no odor  Cervix - No contact bleeding, no active bleeding  Bimanual exam: Cervix closed Uterus non tender, enlarged  Adnexa non tender, no masses bilaterally GC/Chlam, wet prep done Chaperone present for exam.   Musculoskeletal: Normal range of motion.  Neurological: She is alert and oriented to person, place, and time.  Skin: Skin is warm. She is not diaphoretic.  Psychiatric: Her behavior is normal.   MAU Course  Procedures  None  MDM  B positive blood type  Wet prep & GC HIV, CBC, Hcg, ABO US OB transvaginal   Assessment and Plan   A:  1. Vaginal bleeding in pregnancy, first trimester   2. Subchorionic hemorrhage of placenta in first trimester, single or unspecified fetus   3.  [redacted] weeks gestation of pregnancy     P:  Discharge home in stable condition Return to MAU if symptoms worsen Pelvic rest Start prenatal care; message sent to the WOC First trimester warning signs  , Harolyn Rutherford, NP 03/19/2018 8:26 PM

## 2018-03-19 NOTE — MAU Note (Signed)
Early preg. Started bleeding earlier. back has been hurting.

## 2018-03-20 LAB — HIV ANTIBODY (ROUTINE TESTING W REFLEX): HIV Screen 4th Generation wRfx: NONREACTIVE

## 2018-03-22 LAB — GC/CHLAMYDIA PROBE AMP (~~LOC~~) NOT AT ARMC
Chlamydia: NEGATIVE
Neisseria Gonorrhea: NEGATIVE

## 2018-03-25 ENCOUNTER — Ambulatory Visit (INDEPENDENT_AMBULATORY_CARE_PROVIDER_SITE_OTHER): Payer: BLUE CROSS/BLUE SHIELD

## 2018-03-25 VITALS — BP 110/69 | HR 94 | Ht 67.0 in | Wt 159.2 lb

## 2018-03-25 DIAGNOSIS — R3989 Other symptoms and signs involving the genitourinary system: Secondary | ICD-10-CM | POA: Diagnosis not present

## 2018-03-25 LAB — POCT URINALYSIS DIP (DEVICE)
BILIRUBIN URINE: NEGATIVE
Glucose, UA: NEGATIVE mg/dL
Ketones, ur: 15 mg/dL — AB
Nitrite: NEGATIVE
Protein, ur: NEGATIVE mg/dL
Specific Gravity, Urine: >=1.03 (ref 1.005–1.030)
Urobilinogen, UA: 0.2 mg/dL (ref 0.0–1.0)
pH: 6 (ref 5.0–8.0)

## 2018-03-25 MED ORDER — CEPHALEXIN 500 MG PO CAPS: 500.0000 mg | ORAL_CAPSULE | Freq: Three times a day (TID) | ORAL | 0 refills | Status: DC

## 2018-03-25 NOTE — Progress Notes (Signed)
Pt here for UTI check, reviewed with Dr. Shawnie PonsPratt udip showed blood & lue small, advised pt to do Urine Culture. Pt states is having some pain, so since pregnant Rx Keflex. Pt verbalized understanding.

## 2018-03-26 NOTE — Progress Notes (Signed)
Patient seen and assessed by nursing staff.  Agree with documentation and plan.  

## 2018-03-27 LAB — URINE CULTURE

## 2018-03-30 ENCOUNTER — Ambulatory Visit (HOSPITAL_COMMUNITY): Payer: Self-pay

## 2018-03-30 IMAGING — US US OB COMP LESS 14 WK
1 series · 15 of 28 positions shown · non-contrast
Comparison: None.

CLINICAL DATA: Abdominal pain. Estimated gestational age by LMP is
7 weeks 5 days. Quantitative beta HCG is [DATE].

EXAM:
OBSTETRIC <14 WK US AND TRANSVAGINAL OB US
TECHNIQUE: Both transabdominal and transvaginal ultrasound examinations were
performed for complete evaluation of the gestation as well as the
maternal uterus, adnexal regions, and pelvic cul-de-sac.
Transvaginal technique was performed to assess early pregnancy.

[Series 1: us ob comp less 14 wk · 46 acquisitions, 15 frames shown]
[im 1/46]
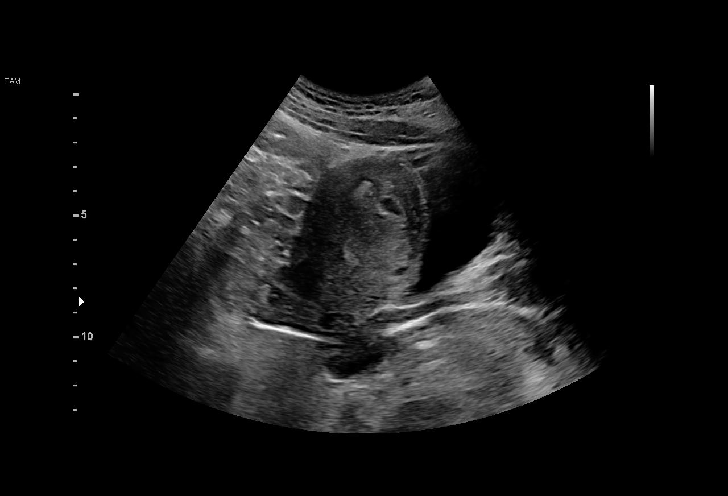
[im 4/46]
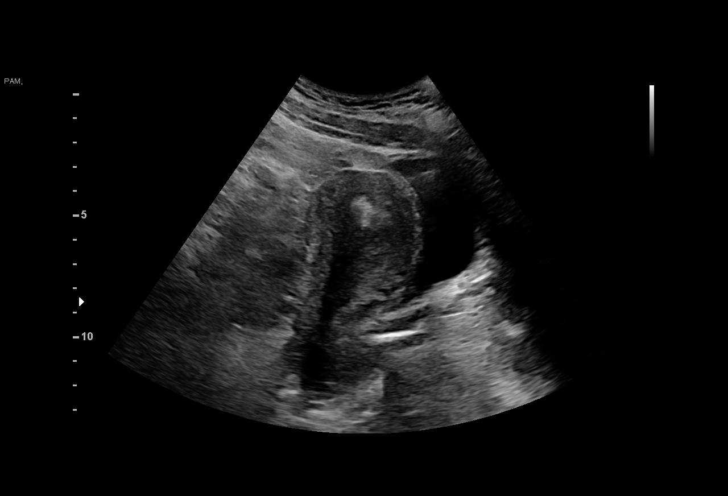
[im 7/46]
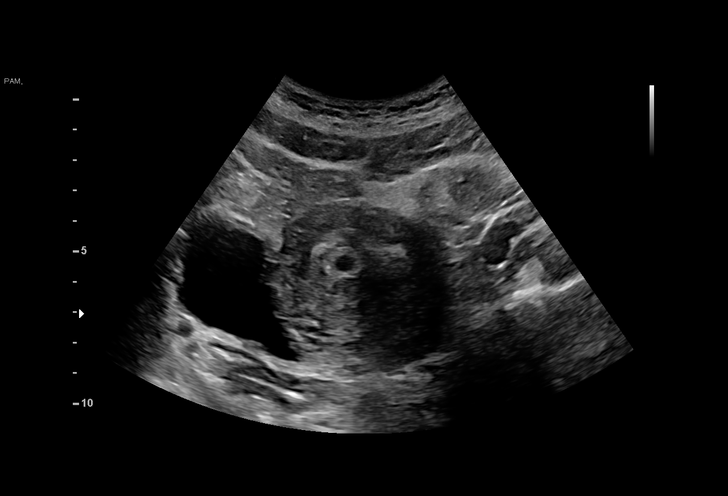
[im 11/46]
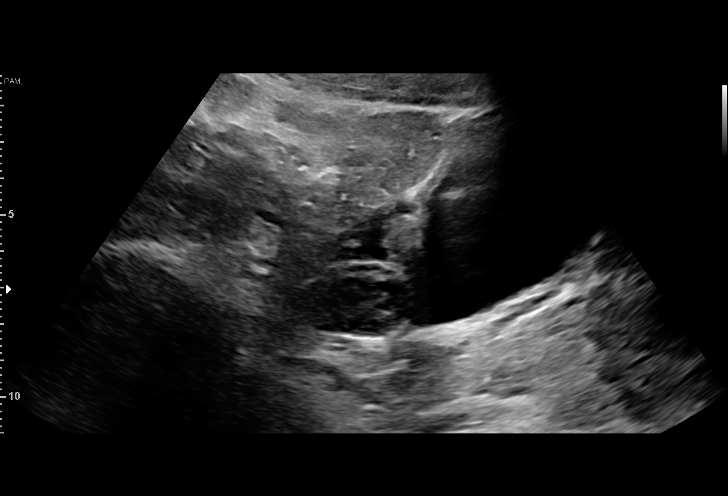
[im 14/46]
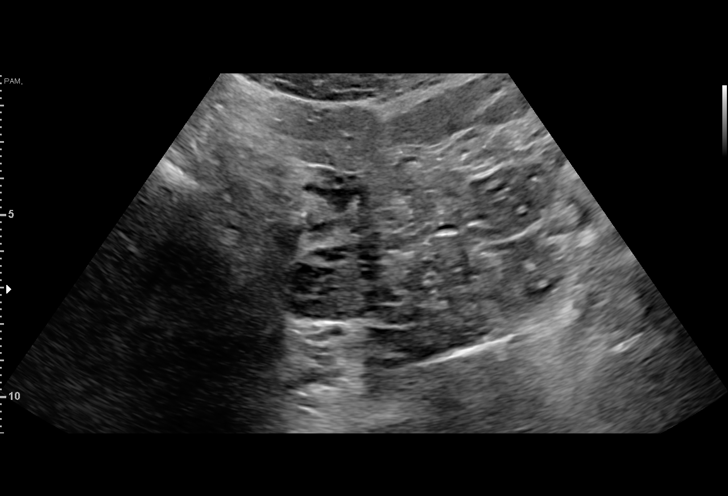
[im 17/46]
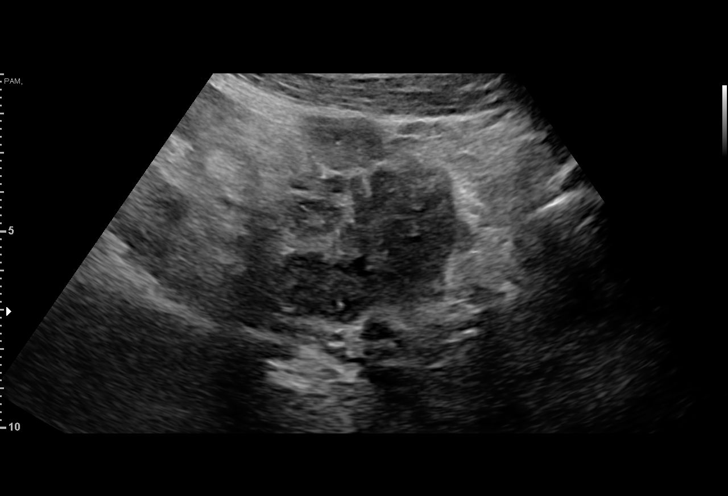
[im 21/46]
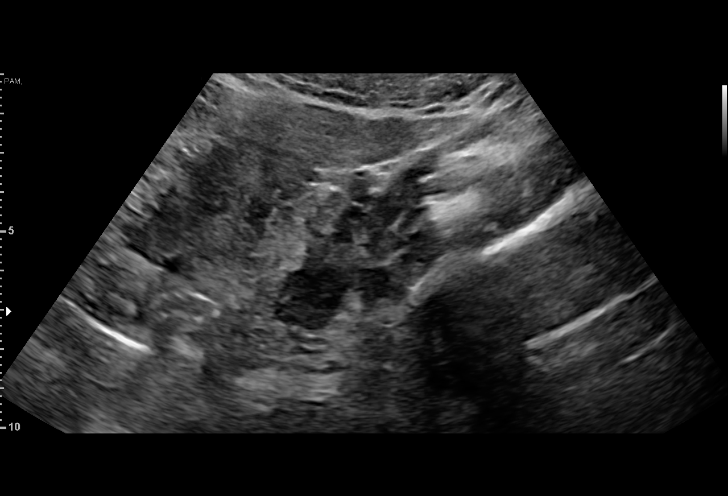
[im 24/46]
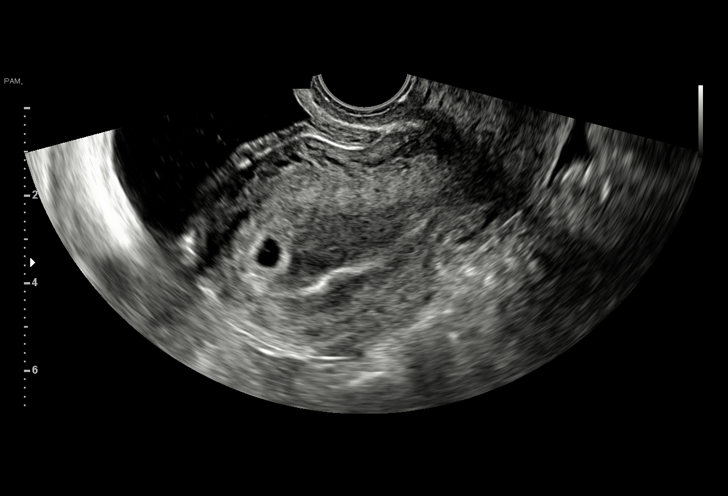
[im 26/46]
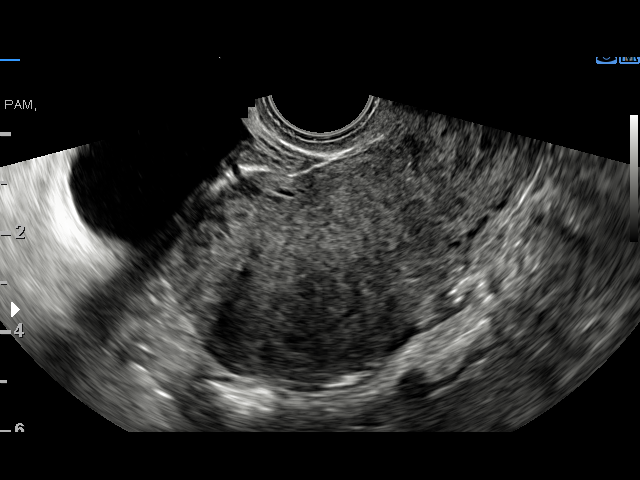
[im 29/46]
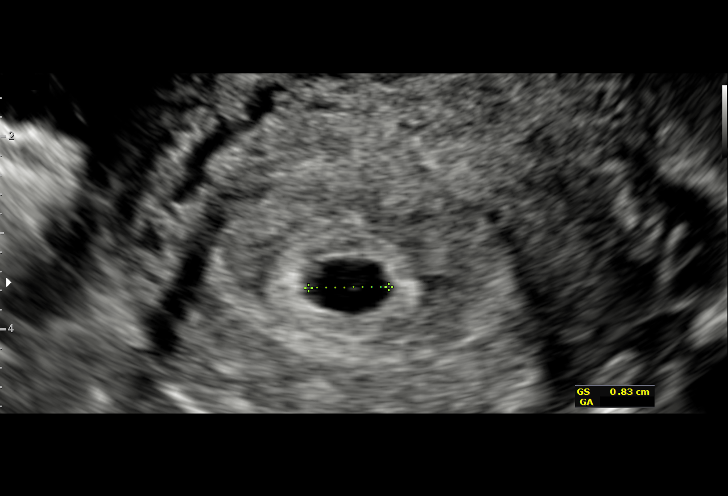
[im 32/46]
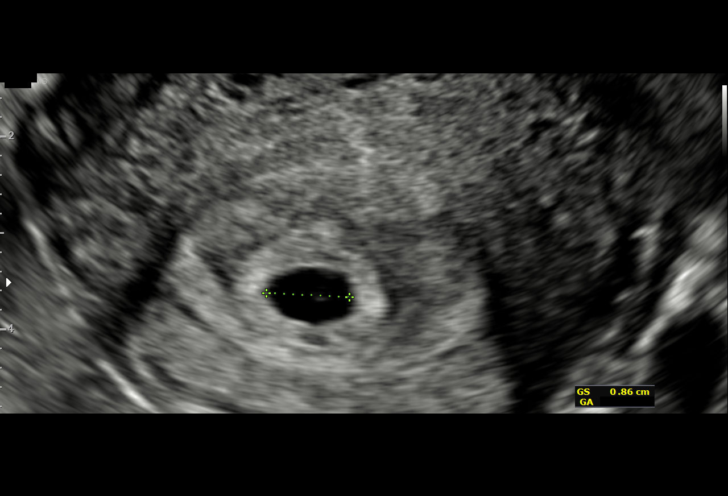
[im 36/46]
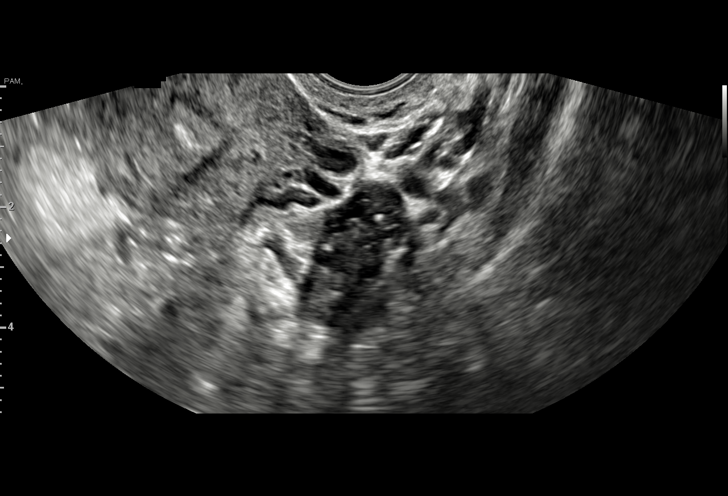
[im 39/46]
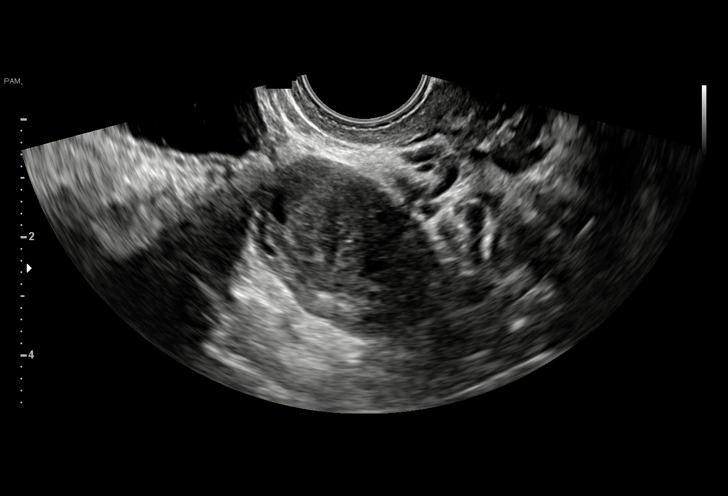
[im 42/46]
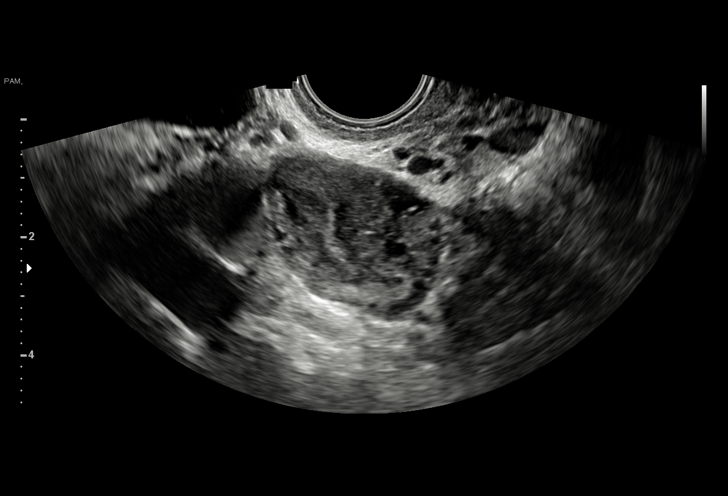
[im 46/46]
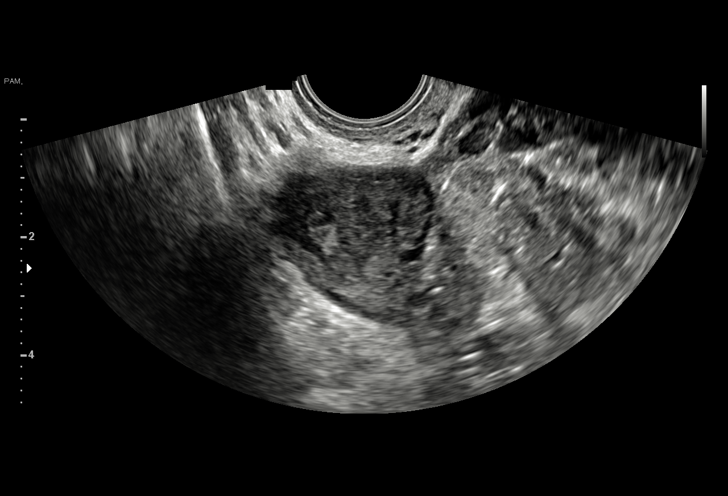

[15 of 28 positions shown; findings below may reference images not displayed]

FINDINGS: Intrauterine gestational sac: A single intrauterine gestational sac
is present.

Yolk sac:  Yolk sac is visualized.

Embryo:  Not visualized.

Cardiac Activity: Not visualized.

MSD: 7.8  mm   5 w   3  d

Subchorionic hemorrhage:  None visualized.

Maternal uterus/adnexae: Uterus is anteverted. No myometrial mass
lesions are identified. Both ovaries are visualized and appear
normal. Small corpus luteal cyst on the right. No free fluid in the
pelvis. Prominent vascularity in the adnexal regions may represent
venous congestion.
IMPRESSION: Probable early intrauterine gestational sac with yolk sac, but no
fetal pole or cardiac activity yet visualized. Recommend follow-up
quantitative B-HCG levels and follow-up US in 14 days to assess
viability. This recommendation follows SRU consensus guidelines:
Diagnostic Criteria for Nonviable Pregnancy Early in the First
Trimester. N Engl J Med 7483; [DATE].

## 2018-04-21 NOTE — L&D Delivery Note (Signed)
OB/GYN Faculty Practice Delivery Note  Tara Sanchez is a 28 y.o. [id number] s/p VD at [redacted]w[redacted]d. She was admitted for PPROM and subsequent IOL.   ROM: 9h 91m with clear fluid GBS Status: Positive/-- (11/23 1640); received adequate ppx Maximum Maternal Temperature: 98.10F  Labor Progress: . Initial SVE: 3/thick/-3. Patient received one Cytotec. Received Epidural. She then progressed to complete.   Delivery Date/Time: 11/26 @ 1201 Delivery: Martin Majestic to room to check patient due to frequent variables. Patient complete +2. Head delivered in ROA position. No nuchal cord present. Shoulder and body delivered in usual fashion. Infant with spontaneous cry, placed on mother's abdomen, dried and stimulated. Cord clamped x 2 after 1-minute delay, and cut by mother's friend in room. Cord blood drawn. Placenta delivered spontaneously with gentle cord traction. Fundus firm with massage and Pitocin. Labia, perineum, vagina, and cervix inspected inspected with no lacerations.  Baby Weight: pending  Placenta: Sent to L&D Complications: None Lacerations: None EBL: 100 mL Analgesia: Epidural   Infant: APGAR (1 MIN): 9   APGAR (5 MINS): 9   APGAR (10 MINS):     Barrington Ellison, MD [facility] Fellow, [facility] for [facility] [place] [facility] 03/17/2019, 12:15 PM

## 2018-04-30 DIAGNOSIS — F3181 Bipolar II disorder: Secondary | ICD-10-CM | POA: Diagnosis not present

## 2018-05-10 ENCOUNTER — Ambulatory Visit (INDEPENDENT_AMBULATORY_CARE_PROVIDER_SITE_OTHER): Payer: BLUE CROSS/BLUE SHIELD

## 2018-05-10 DIAGNOSIS — N760 Acute vaginitis: Secondary | ICD-10-CM | POA: Diagnosis not present

## 2018-05-10 DIAGNOSIS — B9689 Other specified bacterial agents as the cause of diseases classified elsewhere: Secondary | ICD-10-CM | POA: Diagnosis not present

## 2018-05-10 DIAGNOSIS — Z3200 Encounter for pregnancy test, result unknown: Secondary | ICD-10-CM

## 2018-05-10 DIAGNOSIS — Z113 Encounter for screening for infections with a predominantly sexual mode of transmission: Secondary | ICD-10-CM

## 2018-05-10 DIAGNOSIS — Z202 Contact with and (suspected) exposure to infections with a predominantly sexual mode of transmission: Secondary | ICD-10-CM

## 2018-05-10 DIAGNOSIS — N898 Other specified noninflammatory disorders of vagina: Secondary | ICD-10-CM

## 2018-05-10 NOTE — Progress Notes (Addendum)
Pt here for STD check and UPT- faintly positive, pt just had an abortion 2 weeks ago. Had sex without condom 1 day after. Reviewed with provider Nolene Bernheim. States most likely that's why test is showing scantly positive.Marland Kitchen go ahead & do std swab. Advised Pt to retest herself in a couple of weeks. Pt states is on MyChart, advised results will show. Pt verbalized understanding.  Reviewed the note and agree with the plan of care.  Nolene Bernheim, RN, MSN, NP-BC Nurse Practitioner, [facility] for [employer], [facility] 05/10/2018 5:17 PM

## 2018-05-11 LAB — POCT PREGNANCY, URINE: Preg Test, Ur: POSITIVE — AB

## 2018-05-12 ENCOUNTER — Other Ambulatory Visit: Payer: Self-pay | Admitting: Nurse Practitioner

## 2018-05-12 ENCOUNTER — Other Ambulatory Visit: Payer: Self-pay

## 2018-05-12 DIAGNOSIS — O219 Vomiting of pregnancy, unspecified: Secondary | ICD-10-CM

## 2018-05-12 LAB — CERVICOVAGINAL ANCILLARY ONLY
Bacterial vaginitis: POSITIVE — AB
Candida vaginitis: NEGATIVE
Chlamydia: NEGATIVE
Neisseria Gonorrhea: NEGATIVE
TRICH (WINDOWPATH): NEGATIVE

## 2018-05-12 MED ORDER — METRONIDAZOLE 500 MG PO TABS: 500.0000 mg | ORAL_TABLET | Freq: Two times a day (BID) | ORAL | 0 refills | Status: DC

## 2018-05-12 MED ORDER — ONDANSETRON HCL 4 MG PO TABS: 4.0000 mg | ORAL_TABLET | Freq: Three times a day (TID) | ORAL | 1 refills | Status: DC | PRN

## 2018-05-12 NOTE — Progress Notes (Signed)
Self swab testing shows BV.  Metronidazole sent to client's pharmacy and MyChart message sent to client.  Nolene Bernheim, RN, MSN, NP-BC Nurse Practitioner, [facility] for [employer], [facility] 05/12/2018 9:23 PM

## 2018-05-24 ENCOUNTER — Ambulatory Visit: Payer: Self-pay

## 2018-05-26 ENCOUNTER — Ambulatory Visit (INDEPENDENT_AMBULATORY_CARE_PROVIDER_SITE_OTHER): Payer: BLUE CROSS/BLUE SHIELD

## 2018-05-26 DIAGNOSIS — Z3200 Encounter for pregnancy test, result unknown: Secondary | ICD-10-CM | POA: Diagnosis not present

## 2018-05-26 DIAGNOSIS — Z3202 Encounter for pregnancy test, result negative: Secondary | ICD-10-CM

## 2018-05-26 LAB — POCT PREGNANCY, URINE: Preg Test, Ur: NEGATIVE

## 2018-05-26 NOTE — Progress Notes (Addendum)
Pt here today for pregnancy test.  Resulted negative.  Pt reports that she took two pregnancy tests at home that resulted positive.  Pt reports that she had an abortion in December 2019 which she did not follow up for because "they did not call her to schedule appt".  Pt stated that she is having lower back pain.  I explained to the pt that we can do a urinalysis for questionable UTI.  Pt informed me that the the urine that she gave was from earlier today.  I confirmed with pt that the urine that she left was not fresh urine.  Pt stated that she had the urine from earlier in the day and that she could not go to the bathroom.  Pt here for about an hour and could not go to restroom.  Notified Nolene Bernheim, NP that about pt TAB in December and uncertainty of pregnancy test showing positive.  Provider agreed to beta lab draw today.  Notified pt provider's recommendation and that we will f/u with in 24-48 hrs.  Pt stated understanding with no further questions.      Reviewed the note and agree with the documentation and plan  Nolene Bernheim, RN, MSN, NP-BC Nurse Practitioner, [facility] for [employer], [facility] 05/26/2018 8:00 PM

## 2018-05-27 LAB — BETA HCG QUANT (REF LAB): hCG Quant: 7 m[IU]/mL

## 2018-05-31 ENCOUNTER — Telehealth: Payer: Self-pay | Admitting: *Deleted

## 2018-05-31 NOTE — Telephone Encounter (Signed)
I called Tara Sanchez and informed her that her last bhcg was 7 and per provider this is close enough to zero that she can make appointment to start contraception.  She declines, states she is trying to get pregnant.  I placed her on hold and discussed with Dr. Debroah Loop that per chart had abortion in December and then + pregnancy test at home early February and came in and had bhcg which was 7.  Advised patient may try to get pregnant but this last bhcg may indicate she is pregnant again. She states she takes pregnancy tests at home and will do another test in  A week and come in if it is postive.

## 2018-05-31 NOTE — Telephone Encounter (Signed)
-----   Message from Currie Paris, NP sent at 05/27/2018  9:27 PM EST ----- Result shows pregnancy hormone is almost to zero.  So close, I would consider that it is zero (quant was 11,428 on 03-19-18).  Clinical staff - call client to set up an appointment to start contraception.

## 2018-06-08 DIAGNOSIS — F3181 Bipolar II disorder: Secondary | ICD-10-CM | POA: Diagnosis not present

## 2018-06-28 DIAGNOSIS — F3181 Bipolar II disorder: Secondary | ICD-10-CM | POA: Diagnosis not present

## 2018-07-05 ENCOUNTER — Emergency Department (HOSPITAL_COMMUNITY)
Admission: EM | Admit: 2018-07-05 | Discharge: 2018-07-05 | Disposition: A | Discharge status: Home / Self Care | Payer: BLUE CROSS/BLUE SHIELD | Attending: Emergency Medicine | Admitting: Emergency Medicine

## 2018-07-05 ENCOUNTER — Emergency Department (HOSPITAL_COMMUNITY): Payer: BLUE CROSS/BLUE SHIELD

## 2018-07-05 ENCOUNTER — Other Ambulatory Visit: Payer: Self-pay

## 2018-07-05 ENCOUNTER — Encounter (HOSPITAL_COMMUNITY): Payer: Self-pay

## 2018-07-05 DIAGNOSIS — R69 Illness, unspecified: Secondary | ICD-10-CM

## 2018-07-05 DIAGNOSIS — M791 Myalgia, unspecified site: Secondary | ICD-10-CM | POA: Diagnosis not present

## 2018-07-05 DIAGNOSIS — J111 Influenza due to unidentified influenza virus with other respiratory manifestations: Secondary | ICD-10-CM | POA: Insufficient documentation

## 2018-07-05 DIAGNOSIS — R509 Fever, unspecified: Secondary | ICD-10-CM | POA: Diagnosis not present

## 2018-07-05 DIAGNOSIS — J029 Acute pharyngitis, unspecified: Secondary | ICD-10-CM | POA: Diagnosis not present

## 2018-07-05 DIAGNOSIS — R070 Pain in throat: Secondary | ICD-10-CM | POA: Diagnosis not present

## 2018-07-05 DIAGNOSIS — Z79899 Other long term (current) drug therapy: Secondary | ICD-10-CM | POA: Diagnosis not present

## 2018-07-05 DIAGNOSIS — F17210 Nicotine dependence, cigarettes, uncomplicated: Secondary | ICD-10-CM | POA: Insufficient documentation

## 2018-07-05 DIAGNOSIS — R05 Cough: Secondary | ICD-10-CM | POA: Diagnosis not present

## 2018-07-05 LAB — INFLUENZA PANEL BY PCR (TYPE A & B)
Influenza A By PCR: NEGATIVE
Influenza B By PCR: NEGATIVE

## 2018-07-05 LAB — GROUP A STREP BY PCR: Group A Strep by PCR: NOT DETECTED

## 2018-07-05 MED ORDER — FLUTICASONE PROPIONATE 50 MCG/ACT NA SUSP: 1.0000 | Freq: Every day | NASAL | 0 refills | Status: DC

## 2018-07-05 MED ORDER — NAPROXEN 500 MG PO TABS: 500.0000 mg | ORAL_TABLET | Freq: Two times a day (BID) | ORAL | 0 refills | Status: DC

## 2018-07-05 MED ORDER — ACETAMINOPHEN 325 MG PO TABS
650.0000 mg | ORAL_TABLET | Freq: Once | ORAL | Status: AC
  Administered 2018-07-05: 650 mg via ORAL
  Filled 2018-07-05: qty 2

## 2018-07-05 NOTE — ED Provider Notes (Signed)
[facility]-EMERGENCY DEPT Provider Note   CSN: [id number] Arrival date & time: 07/05/18  1416    History   Chief Complaint Chief Complaint  Patient presents with  . Fever  . Sore Throat  . Generalized Body Aches    HPI Tara Sanchez is a 28 y.o. female with a hx of tobacco abuse who presents to the ER with complaints of flu like sxs that started over the past 48 hours. Patient notes congestion, ear pressure, sore throat, fevers, chills, & generalized body aches. She has also had a dry cough for about 1 week. Has utilized cough drops with temporary improvement. No other alleviating/aggravating factors. Denies chest pain, dyspnea, emesis, abdominal pain, or diarrhea. Denies recent travel or covid 19 exposures. No one sick that she knows w/ similar sxs.      HPI  Past Medical History:  Diagnosis Date  . Genital herpes   . Headache   . History of preterm delivery, currently pregnant 04/17/2016   makena  . Infection    UTI  . Medical history non-contributory     Patient Active Problem List   Diagnosis Date Noted  . Chlamydia 10/20/2017  . Maternal varicella, non-immune 01/09/2015    Past Surgical History:  Procedure Laterality Date  . INDUCED ABORTION    . THERAPEUTIC ABORTION       OB History    Gravida  6   Para  3   Term  1   Preterm  2   AB  2   Living  3     SAB  0   TAB  2   Ectopic  0   Multiple      Live Births  3            Home Medications    Prior to Admission medications   Medication Sig Start Date End Date Taking? Authorizing Provider  cephALEXin (KEFLEX) 500 MG capsule Take 1 capsule (500 mg total) by mouth 3 (three) times daily. 03/25/18   Reva Bores, MD  metroNIDAZOLE (FLAGYL) 500 MG tablet Take 1 tablet (500 mg total) by mouth 2 (two) times daily. No alcohol while taking this medication 05/12/18   Currie Paris, NP  ondansetron (ZOFRAN) 4 MG tablet Take 1 tablet (4 mg total) by mouth every 8  (eight) hours as needed for nausea or vomiting. 05/12/18   Arvilla Market, DO  valACYclovir (VALTREX) 500 MG tablet Take 1 tablet (500 mg total) by mouth 2 (two) times daily. Take this Rx 1st Patient not taking: Reported on 03/16/2018 03/03/18   Armando Reichert, CNM    Family History Family History  Problem Relation Age of Onset  . Diabetes Paternal Grandmother   . Asthma Neg Hx   . Cancer Neg Hx   . Heart disease Neg Hx   . Hypertension Neg Hx   . Stroke Neg Hx   . Alcohol abuse Neg Hx   . Arthritis Neg Hx   . Birth defects Neg Hx   . COPD Neg Hx   . Depression Neg Hx   . Drug abuse Neg Hx   . Early death Neg Hx   . Hearing loss Neg Hx   . Hyperlipidemia Neg Hx   . Kidney disease Neg Hx   . Learning disabilities Neg Hx   . Mental illness Neg Hx   . Mental retardation Neg Hx   . Miscarriages / Stillbirths Neg Hx   . Vision loss  Neg Hx   . Varicose Veins Neg Hx     Social History Social History   Tobacco Use  . Smoking status: Current Some Day Smoker    Types: Cigarettes  . Smokeless tobacco: Never Used  Substance Use Topics  . Alcohol use: Never    Frequency: Never    Comment: Social  . Drug use: Not Currently    Types: Marijuana    Comment: September 2019     Allergies   Macrobid [nitrofurantoin macrocrystal]; Macrobid [nitrofurantoin]; Phenergan [promethazine hcl]; and Tessalon [benzonatate]   Review of Systems Review of Systems  Constitutional: Positive for chills and fever.  HENT: Positive for congestion, ear pain and sore throat.   Respiratory: Positive for cough. Negative for shortness of breath.   Cardiovascular: Negative for chest pain.  Gastrointestinal: Negative for abdominal pain, diarrhea and vomiting.  Musculoskeletal: Positive for myalgias.     Physical Exam Updated Vital Signs BP 131/70   Pulse (!) 114   Temp (!) 100.7 F (38.2 C) (Oral)   Resp 17   Ht  (1.702 m)   Wt 72.6 kg   LMP 06/04/2017   SpO2 100%    Breastfeeding Unknown   BMI 25.06 kg/m   Physical Exam Vitals signs and nursing note reviewed.  Constitutional:      General: She is not in acute distress.    Appearance: She is well-developed.  HENT:     Head: Normocephalic and atraumatic.     Right Ear: Ear canal normal. Tympanic membrane is not perforated, erythematous, retracted or bulging.     Left Ear: Ear canal normal. Tympanic membrane is not perforated, erythematous, retracted or bulging.     Ears:     Comments: No mastoid erythema/swelling/tenderness.     Nose: Congestion present.     Right Sinus: No maxillary sinus tenderness or frontal sinus tenderness.     Left Sinus: No maxillary sinus tenderness or frontal sinus tenderness.     Mouth/Throat:     Pharynx: Uvula midline. Posterior oropharyngeal erythema present. No oropharyngeal exudate.     Comments: Posterior oropharynx is symmetric appearing. Patient tolerating own secretions without difficulty. No trismus. No drooling. No hot potato voice. No swelling beneath the tongue, submandibular compartment is soft.  Eyes:     General:        Right eye: No discharge.        Left eye: No discharge.     Conjunctiva/sclera: Conjunctivae normal.     Pupils: Pupils are equal, round, and reactive to light.  Neck:     Musculoskeletal: Normal range of motion and neck supple. No edema or neck rigidity.  Cardiovascular:     Rate and Rhythm: Regular rhythm. Tachycardia present.     Heart sounds: No murmur.  Pulmonary:     Effort: Pulmonary effort is normal. No respiratory distress.     Breath sounds: Normal breath sounds. No wheezing, rhonchi or rales.  Abdominal:     General: There is no distension.     Palpations: Abdomen is soft.     Tenderness: There is no abdominal tenderness.  Lymphadenopathy:     Cervical: No cervical adenopathy.  Skin:    General: Skin is warm and dry.     Findings: No rash.  Neurological:     Mental Status: She is alert.  Psychiatric:         Behavior: Behavior normal.      ED Treatments / Results  Labs (all labs ordered are  listed, but only abnormal results are displayed) Labs Reviewed  GROUP A STREP BY PCR  INFLUENZA PANEL BY PCR (TYPE A & B)    EKG None  Radiology No results found.  Procedures Procedures (including critical care time)  Medications Ordered in ED Medications  acetaminophen (TYLENOL) tablet 650 mg (has no administration in time range)     Initial Impression / Assessment and Plan / ED Course  I have reviewed the triage vital signs and the nursing notes.  Pertinent labs & imaging results that were available during my care of the patient were reviewed by me and considered in my medical decision making (see chart for details).    Patient presents with flu like sxs.  Patient is nontoxic appearing, in no apparent distress, vitals notable for fever w/ temp of 100.7 w/ likely resultant tachycardia- normalized w/ anti-pyretics in the ER. Lungs are CTA, CXR  negative for infiltrate, doubt pneumonia. There is no wheezing or signs of respiratory distress. Sxs onset < 7 days, no sinus tenderness, doubt acute bacterial sinusitis.  Strep negative, exam not consistent with RPA/PTA. No evidence of AOM on exam. No meningeal signs. No travel or covid 19 exposures. Influenza test negative. Suspect viral etiology at this time and will treat supportively with Naproxen & flonase. I discussed results, treatment plan, need for PCP follow-up, and return precautions with the patient. Provided opportunity for questions, patient confirmed understanding and is in agreement with plan.   Vitals:   07/05/18 1437 07/05/18 1700  BP: 131/70 103/64  Pulse: (!) 114 89  Resp: 17 18  Temp: (!) 100.7 F (38.2 C) 99.2 F (37.3 C)  SpO2: 100% 99%     Final Clinical Impressions(s) / ED Diagnoses   Final diagnoses:  Influenza-like illness    ED Discharge Orders         Ordered    fluticasone (FLONASE) 50 MCG/ACT nasal spray   Daily     07/05/18 1655    naproxen (NAPROSYN) 500 MG tablet  2 times daily     07/05/18 [address] [place], PA-C 07/05/18 1708    Gerhard Munch, MD 07/07/18 [phone]

## 2018-07-05 NOTE — Discharge Instructions (Signed)
You were seen in the emergency today for upper respiratory symptoms, we suspect your symptoms are related to allergies or a virus at this time.  I have prescribed you multiple medications to treat your symptoms.   -Flonase to be used 1 spray in each nostril daily.  This medication is used to treat your congestion.  -Tessalon can be taken once every 8 hours as needed.  This medication is used to treat your cough.  -Ibuprofen to be taken once every 8 hours as needed for pain. Please take this medicine with food as it can cause stomach upset and at worst stomach bleeding. Do not take other NSAIDs such as motrin, aleve, advil, naproxen, mobic, etc as they are similar. You make take tylenol per over the counter dosing with this medicine safely.  We have prescribed you new medication(s) today. Discuss the medications prescribed today with your pharmacist as they can have adverse effects and interactions with your other medicines including over the counter and prescribed medications. Seek medical evaluation if you start to experience new or abnormal symptoms after taking one of these medicines, seek care immediately if you start to experience difficulty breathing, feeling of your throat closing, facial swelling, or rash as these could be indications of a more serious allergic reaction  You will need to follow-up with your primary care provider in 1 week if your symptoms have not improved.  If you do not have a primary care provider one is provided in your discharge instructions.  Return to the emergency department for any new or worsening symptoms including but not limited to persistent fever for 5 days, difficulty breathing, chest pain, rashes, passing out, or any other concerns.

## 2018-07-05 NOTE — ED Triage Notes (Signed)
Patient reports that she had a dry frequent cough last week. Patient c/o fever, headache, and sore throat x 2 days.

## 2018-07-19 DIAGNOSIS — F3181 Bipolar II disorder: Secondary | ICD-10-CM | POA: Diagnosis not present

## 2018-07-22 ENCOUNTER — Telehealth: Payer: BLUE CROSS/BLUE SHIELD | Admitting: Family

## 2018-07-22 DIAGNOSIS — R300 Dysuria: Secondary | ICD-10-CM

## 2018-07-22 NOTE — Progress Notes (Signed)
Based on what you shared with me, I feel your condition warrants further evaluation and I recommend that you be seen for a face to face office visit.     NOTE: If you entered your credit card information for this eVisit, you will not be charged. You may see a "hold" on your card for the $35 but that hold will drop off and you will not have a charge processed.  If you are having a true medical emergency please call 911.  If you need an urgent face to face visit, [facility] has four urgent care centers for your convenience.    PLEASE NOTE: THE [facility] LOCATIONS AND URGENT CARE CLINICS DO NOT HAVE THE TESTING FOR CORONAVIRUS COVID19 AVAILABLE.  IF YOU FEEL YOU NEED THIS TEST YOU MUST GO TO A TRIAGE LOCATION AT ONE OF THE HOSPITAL EMERGENCY DEPARTMENTS   [email or web address] to reserve your spot online an avoid wait times  [facility] [address], [address] [place], [place] [address] 8 am to 8 pm Monday-Friday 10 am to 4 pm Saturday-Sunday *Across the street from [employer]  [facility]  [address] [place], [address] 8 am to 5 pm Monday-Friday * In the [facility]   The following sites will take your insurance:  . [facility]  [phone] Get Driving Directions Find a Provider at this Location  [address] [place], [place] [address] . 10 am to 8 pm Monday-Friday . 12 pm to 8 pm Saturday-Sunday   . [facility]  [phone] Get Driving Directions Find a Provider at this Location  [address], [address] [place], [place] [address] . 8 am to 8 pm Monday-Friday . 9 am to 6 pm Saturday . 11 am to 6 pm Sunday   . [facility] [place]  [phone] Get Driving Directions  [address] [place],  [address] . 8 am to 8 pm Monday-Friday . 8 am to 4 pm Saturday-Sunday   Your e-visit answers were  reviewed by a board certified advanced clinical practitioner to complete your personal care plan.  Thank you for using e-Visits. 

## 2018-08-02 ENCOUNTER — Other Ambulatory Visit: Payer: Self-pay

## 2018-08-02 ENCOUNTER — Inpatient Hospital Stay (HOSPITAL_COMMUNITY): Payer: BLUE CROSS/BLUE SHIELD

## 2018-08-02 ENCOUNTER — Inpatient Hospital Stay (HOSPITAL_COMMUNITY)
Admission: AD | Admit: 2018-08-02 | Discharge: 2018-08-02 | Disposition: A | Discharge status: Home / Self Care | Payer: BLUE CROSS/BLUE SHIELD | Source: Ambulatory Visit | Attending: Obstetrics & Gynecology | Admitting: Obstetrics & Gynecology

## 2018-08-02 ENCOUNTER — Encounter (HOSPITAL_COMMUNITY): Payer: Self-pay | Admitting: *Deleted

## 2018-08-02 DIAGNOSIS — O26891 Other specified pregnancy related conditions, first trimester: Secondary | ICD-10-CM | POA: Diagnosis not present

## 2018-08-02 DIAGNOSIS — Z79899 Other long term (current) drug therapy: Secondary | ICD-10-CM | POA: Diagnosis not present

## 2018-08-02 DIAGNOSIS — O23591 Infection of other part of genital tract in pregnancy, first trimester: Secondary | ICD-10-CM | POA: Diagnosis not present

## 2018-08-02 DIAGNOSIS — R109 Unspecified abdominal pain: Secondary | ICD-10-CM | POA: Diagnosis not present

## 2018-08-02 DIAGNOSIS — Z3A00 Weeks of gestation of pregnancy not specified: Secondary | ICD-10-CM | POA: Diagnosis not present

## 2018-08-02 DIAGNOSIS — N760 Acute vaginitis: Secondary | ICD-10-CM

## 2018-08-02 DIAGNOSIS — O26851 Spotting complicating pregnancy, first trimester: Secondary | ICD-10-CM | POA: Diagnosis not present

## 2018-08-02 DIAGNOSIS — O3680X0 Pregnancy with inconclusive fetal viability, not applicable or unspecified: Secondary | ICD-10-CM | POA: Diagnosis not present

## 2018-08-02 DIAGNOSIS — Z3A01 Less than 8 weeks gestation of pregnancy: Secondary | ICD-10-CM | POA: Insufficient documentation

## 2018-08-02 DIAGNOSIS — M549 Dorsalgia, unspecified: Secondary | ICD-10-CM | POA: Diagnosis not present

## 2018-08-02 DIAGNOSIS — Z791 Long term (current) use of non-steroidal anti-inflammatories (NSAID): Secondary | ICD-10-CM | POA: Diagnosis not present

## 2018-08-02 DIAGNOSIS — B9689 Other specified bacterial agents as the cause of diseases classified elsewhere: Secondary | ICD-10-CM

## 2018-08-02 LAB — URINALYSIS, ROUTINE W REFLEX MICROSCOPIC
Bacteria, UA: NONE SEEN
Bilirubin Urine: NEGATIVE
Glucose, UA: NEGATIVE mg/dL
Ketones, ur: NEGATIVE mg/dL
Nitrite: NEGATIVE
Protein, ur: NEGATIVE mg/dL
Specific Gravity, Urine: 1.03 (ref 1.005–1.030)
pH: 5 (ref 5.0–8.0)

## 2018-08-02 LAB — HCG, QUANTITATIVE, PREGNANCY: hCG, Beta Chain, Quant, S: 70 m[IU]/mL — ABNORMAL HIGH (ref <5)

## 2018-08-02 LAB — CBC WITH DIFFERENTIAL/PLATELET
Abs Immature Granulocytes: 0.03 10*3/uL (ref 0.00–0.07)
Basophils Absolute: 0 10*3/uL (ref 0.0–0.1)
Basophils Relative: 0 %
Eosinophils Absolute: 0.1 10*3/uL (ref 0.0–0.5)
Eosinophils Relative: 1 %
HCT: 39.5 % (ref 36.0–46.0)
Hemoglobin: 12.9 g/dL (ref 12.0–15.0)
Immature Granulocytes: 0 %
Lymphocytes Relative: 33 %
Lymphs Abs: 3.8 10*3/uL (ref 0.7–4.0)
MCH: 30 pg (ref 26.0–34.0)
MCHC: 32.7 g/dL (ref 30.0–36.0)
MCV: 91.9 fL (ref 80.0–100.0)
Monocytes Absolute: 1 10*3/uL (ref 0.1–1.0)
Monocytes Relative: 9 %
Neutro Abs: 6.5 10*3/uL (ref 1.7–7.7)
Neutrophils Relative %: 57 %
Platelets: 224 10*3/uL (ref 150–400)
RBC: 4.3 MIL/uL (ref 3.87–5.11)
RDW: 13.2 % (ref 11.5–15.5)
WBC: 11.5 10*3/uL — ABNORMAL HIGH (ref 4.0–10.5)
nRBC: 0 % (ref 0.0–0.2)

## 2018-08-02 LAB — WET PREP, GENITAL
Trich, Wet Prep: NONE SEEN
Yeast Wet Prep HPF POC: NONE SEEN

## 2018-08-02 LAB — POCT PREGNANCY, URINE: Preg Test, Ur: POSITIVE — AB

## 2018-08-02 MED ORDER — PREPLUS 27-1 MG PO TABS: 1.0000 | ORAL_TABLET | Freq: Every day | ORAL | 13 refills | Status: DC

## 2018-08-02 MED ORDER — METRONIDAZOLE 500 MG PO TABS: 500.0000 mg | ORAL_TABLET | Freq: Two times a day (BID) | ORAL | 0 refills | Status: DC

## 2018-08-02 NOTE — MAU Provider Note (Signed)
History     CSN: [id number]  Arrival date and time: 08/02/18 1722   First Provider Initiated Contact with Patient 08/02/18 1817      Chief Complaint  Patient presents with  . Abdominal Pain   HPI Tara Sanchez is a 28 y.o. [phone] at [id number] who presents to MAU with chief complaint of vaginal spotting and abdominal pain in the setting of two positive HPTs. She reports LMP of about 07/03/2018. Her history is significant for abortion in January 2020.   Patient states she wouldn't have noticed vaginal spotting except her menstrual cycle was due to start and she noticed her "tampon wasn't full of blood at the end of the day like it normally is". She denies heavy vaginal bleeding and is unsure if she is continuing to bleed upon arrival in MAU.   Patient also reports mild suprapubic cramping "like a stomachache but in my abdomen". She is unable to provide a pain rating and has not taken medication or tried other treatments for this complaint. She declines pain medicine in MAU.  Patient has third complaint of back pain, but upon CNM introduction and assessment she points to a skin lesion on her left abdomen and refers to this as her back pain.  She denies recent intercourse, abnormal vaginal discharge, urinary symptoms, fever or recent illness.  OB History    Gravida  7   Para  3   Term  1   Preterm  2   AB  3   Living  3     SAB  0   TAB  3   Ectopic  0   Multiple      Live Births  3           Past Medical History:  Diagnosis Date  . Genital herpes   . Headache   . History of preterm delivery, currently pregnant 04/17/2016   makena  . Infection    UTI  . Medical history non-contributory     Past Surgical History:  Procedure Laterality Date  . INDUCED ABORTION    . THERAPEUTIC ABORTION      Family History  Problem Relation Age of Onset  . Diabetes Paternal Grandmother   . Asthma Neg Hx   . Cancer Neg Hx   . Heart disease Neg Hx   . Hypertension Neg  Hx   . Stroke Neg Hx   . Alcohol abuse Neg Hx   . Arthritis Neg Hx   . Birth defects Neg Hx   . COPD Neg Hx   . Depression Neg Hx   . Drug abuse Neg Hx   . Early death Neg Hx   . Hearing loss Neg Hx   . Hyperlipidemia Neg Hx   . Kidney disease Neg Hx   . Learning disabilities Neg Hx   . Mental illness Neg Hx   . Mental retardation Neg Hx   . Miscarriages / Stillbirths Neg Hx   . Vision loss Neg Hx   . Varicose Veins Neg Hx     Social History   Tobacco Use  . Smoking status: Never Smoker  . Smokeless tobacco: Never Used  Substance Use Topics  . Alcohol use: Not Currently    Frequency: Never    Comment: Social  . Drug use: Yes    Types: Marijuana    Comment: Last use 08/01/2018    Allergies:  Allergies  Allergen Reactions  . Macrobid [Nitrofurantoin Macrocrystal] Anaphylaxis  . Macrobid [Nitrofurantoin] Anaphylaxis  .  Phenergan [Promethazine Hcl] Anaphylaxis  . Tessalon [Benzonatate] Anaphylaxis    Medications Prior to Admission  Medication Sig Dispense Refill Last Dose  . cephALEXin (KEFLEX) 500 MG capsule Take 1 capsule (500 mg total) by mouth 3 (three) times daily. 21 capsule 0   . fluticasone (FLONASE) 50 MCG/ACT nasal spray Place 1 spray into both nostrils daily. 16 g 0   . metroNIDAZOLE (FLAGYL) 500 MG tablet Take 1 tablet (500 mg total) by mouth 2 (two) times daily. No alcohol while taking this medication 14 tablet 0   . naproxen (NAPROSYN) 500 MG tablet Take 1 tablet (500 mg total) by mouth 2 (two) times daily. 10 tablet 0   . ondansetron (ZOFRAN) 4 MG tablet Take 1 tablet (4 mg total) by mouth every 8 (eight) hours as needed for nausea or vomiting. 30 tablet 1   . valACYclovir (VALTREX) 500 MG tablet Take 1 tablet (500 mg total) by mouth 2 (two) times daily. Take this Rx 1st (Patient not taking: Reported on 03/16/2018) 6 tablet 0 Not Taking    Review of Systems  Constitutional: Negative for chills, fatigue and fever.  Respiratory: Negative for shortness  of breath.   Gastrointestinal: Positive for abdominal pain.  Genitourinary: Positive for vaginal bleeding. Negative for decreased urine volume, difficulty urinating, dyspareunia, dysuria, flank pain, pelvic pain and urgency.  Musculoskeletal: Positive for back pain.  Neurological: Negative for headaches.  All other systems reviewed and are negative.  Physical Exam   Blood pressure 109/81, pulse 89, temperature 99 F (37.2 C), temperature source Oral, resp. rate 16, weight 84.8 kg, last menstrual period 07/02/2018, SpO2 100 %, unknown if currently breastfeeding.  Physical Exam  Nursing note and vitals reviewed. Constitutional: She is oriented to person, place, and time. She appears well-developed and well-nourished.  Cardiovascular: Normal rate.  Respiratory: Effort normal and breath sounds normal.  GI: Soft. She exhibits no distension. There is no abdominal tenderness. There is no rigidity, no rebound, no guarding, no CVA tenderness, no tenderness at McBurney's point and negative Murphy's sign.  Genitourinary:    Uterus normal.     Vaginal discharge present.     Genitourinary Comments: Thin white vaginal discharge visible on perineum. Swabs collected via blind swab.   Musculoskeletal: Normal range of motion.  Neurological: She is alert and oriented to person, place, and time.  Skin: Skin is warm and dry.  Psychiatric: She has a normal mood and affect. Her behavior is normal. Judgment and thought content normal.    MAU Course/MDM  Procedures  Patient Vitals for the past 24 hrs:  BP Temp Temp src Pulse Resp SpO2 Weight  08/02/18 1946 114/63 - - - - - -  08/02/18 1736 109/81 99 F (37.2 C) Oral 89 16 100 % 84.8 kg    Results for orders placed or performed during the hospital encounter of 08/02/18 (from the past 24 hour(s))  Pregnancy, urine POC     Status: Abnormal   Collection Time: 08/02/18  5:53 PM  Result Value Ref Range   Preg Test, Ur POSITIVE (A) NEGATIVE  Urinalysis,  Routine w reflex microscopic     Status: Abnormal   Collection Time: 08/02/18  5:54 PM  Result Value Ref Range   Color, Urine YELLOW YELLOW   APPearance HAZY (A) CLEAR   Specific Gravity, Urine 1.030 1.005 - 1.030   pH 5.0 5.0 - 8.0   Glucose, UA NEGATIVE NEGATIVE mg/dL   Hgb urine dipstick LARGE (A) NEGATIVE   Bilirubin Urine  NEGATIVE NEGATIVE   Ketones, ur NEGATIVE NEGATIVE mg/dL   Protein, ur NEGATIVE NEGATIVE mg/dL   Nitrite NEGATIVE NEGATIVE   Leukocytes,Ua TRACE (A) NEGATIVE   RBC / HPF 0-5 0 - 5 RBC/hpf   WBC, UA 0-5 0 - 5 WBC/hpf   Bacteria, UA NONE SEEN NONE SEEN   Squamous Epithelial / LPF 0-5 0 - 5   Mucus PRESENT   Wet prep, genital     Status: Abnormal   Collection Time: 08/02/18  6:26 PM  Result Value Ref Range   Yeast Wet Prep HPF POC NONE SEEN NONE SEEN   Trich, Wet Prep NONE SEEN NONE SEEN   Clue Cells Wet Prep HPF POC PRESENT (A) NONE SEEN   WBC, Wet Prep HPF POC MANY (A) NONE SEEN   Sperm PRESENT   CBC with Differential/Platelet     Status: Abnormal   Collection Time: 08/02/18  6:40 PM  Result Value Ref Range   WBC 11.5 (H) 4.0 - 10.5 K/uL   RBC 4.30 3.87 - 5.11 MIL/uL   Hemoglobin 12.9 12.0 - 15.0 g/dL   HCT 04.5 40.9 - 81.1 %   MCV 91.9 80.0 - 100.0 fL   MCH 30.0 26.0 - 34.0 pg   MCHC 32.7 30.0 - 36.0 g/dL   RDW 91.4 78.2 - 95.6 %   Platelets 224 150 - 400 K/uL   nRBC 0.0 0.0 - 0.2 %   Neutrophils Relative % 57 %   Neutro Abs 6.5 1.7 - 7.7 K/uL   Lymphocytes Relative 33 %   Lymphs Abs 3.8 0.7 - 4.0 K/uL   Monocytes Relative 9 %   Monocytes Absolute 1.0 0.1 - 1.0 K/uL   Eosinophils Relative 1 %   Eosinophils Absolute 0.1 0.0 - 0.5 K/uL   Basophils Relative 0 %   Basophils Absolute 0.0 0.0 - 0.1 K/uL   Immature Granulocytes 0 %   Abs Immature Granulocytes 0.03 0.00 - 0.07 K/uL  hCG, quantitative, pregnancy     Status: Abnormal   Collection Time: 08/02/18  6:40 PM  Result Value Ref Range   hCG, Beta Chain, Quant, S 70 (H) <5 mIU/mL    US Ob  Less Than 14 Weeks With Ob Transvaginal  Result Date: 08/02/2018 CLINICAL DATA:  Abdominal pain EXAM: OBSTETRIC <14 WK [name] AND TRANSVAGINAL OB US TECHNIQUE: Both transabdominal and transvaginal ultrasound examinations were performed for complete evaluation of the gestation as well as the maternal uterus, adnexal regions, and pelvic cul-de-sac. Transvaginal technique was performed to assess early pregnancy. COMPARISON:  None. FINDINGS: Intrauterine gestational sac: None Yolk sac:  Not Visualized. Embryo:  Not Visualized. Cardiac Activity: Not Visualized. Subchorionic hemorrhage:  None visualized. Maternal uterus/adnexae: Right ovary measures 2.8 x 1.9 x 2 cm. Left ovary measures 3.1 x 2.6 x 2.4 cm. No adnexal mass. No pelvic free fluid. IMPRESSION: 1. No intrauterine pregnancy is identified. In the setting of an elevated beta HCG differential considerations include missed abortion versus pregnancy too early to detect versus ectopic pregnancy. Recommend clinical correlation, serial quantitative beta HCGs, ectopic precautions, and followup ultrasound as clinically indicated. Electronically Signed   By: Elige Ko   On: 08/02/2018 19:28    Meds ordered this encounter  Medications  . metroNIDAZOLE (FLAGYL) 500 MG tablet    Sig: Take 1 tablet (500 mg total) by mouth 2 (two) times daily.    Dispense:  14 tablet    Refill:  0    Order Specific Question:   Supervising Provider  Answer:   Reva Bores [[id number]]  . Prenatal Vit-Fe Fumarate-FA (PREPLUS) 27-1 MG TABS    Sig: Take 1 tablet by mouth daily.    Dispense:  30 tablet    Refill:  13    Order Specific Question:   Supervising Provider    Answer:   Reva Bores [[id number]]   Assessment and Plan  --28 y.o. [phone] at [redacted]w[redacted]d by LMP with pregnancy of unknown location --Ectopic and bleeding precautions reviewed with patient --Bacterial Vaginosis. Rx Flagyl and PNV to patient pharmacy --Discharge home in stable condition  F/U: Repeat Quant at [facility]  morning of 04/16. Message sent to clinic  Calvert Cantor, CNM 08/02/2018, 8:00 PM

## 2018-08-02 NOTE — Discharge Instructions (Signed)
Abdominal Pain During Pregnancy ° °Abdominal pain is common during pregnancy, and has many possible causes. Some causes are more serious than others, and sometimes the cause is not known. Abdominal pain can be a sign that labor is starting. It can also be caused by normal growth and stretching of muscles and ligaments during pregnancy. Always tell your health care provider if you have any abdominal pain. °Follow these instructions at home: °· Do not have sex or put anything in your vagina until your pain goes away completely. °· Get plenty of rest until your pain improves. °· Drink enough fluid to keep your urine pale yellow. °· Take over-the-counter and prescription medicines only as told by your health care provider. °· Keep all follow-up visits as told by your health care provider. This is important. °Contact a health care provider if: °· Your pain continues or gets worse after resting. °· You have lower abdominal pain that: °? Comes and goes at regular intervals. °? Spreads to your back. °? Is similar to menstrual cramps. °· You have pain or burning when you urinate. °Get help right away if: °· You have a fever or chills. °· You have vaginal bleeding. °· You are leaking fluid from your vagina. °· You are passing tissue from your vagina. °· You have vomiting or diarrhea that lasts for more than 24 hours. °· Your baby is moving less than usual. °· You feel very weak or faint. °· You have shortness of breath. °· You develop severe pain in your upper abdomen. °Summary °· Abdominal pain is common during pregnancy, and has many possible causes. °· If you experience abdominal pain during pregnancy, tell your health care provider right away. °· Follow your health care provider's home care instructions and keep all follow-up visits as directed. °This information is not intended to replace advice given to you by your health care provider. Make sure you discuss any questions you have with your health care  provider. °Document Released: 04/07/2005 Document Revised: 07/10/2016 Document Reviewed: 07/10/2016 °Elsevier Interactive Patient Education © 2019 [employer] ° °

## 2018-08-02 NOTE — MAU Note (Signed)
Pain in lower abd started yesterday.  Noted something questionable in a stretch mark on the left side. Period didn't come, 2 +HPT today.

## 2018-08-03 LAB — GC/CHLAMYDIA PROBE AMP (~~LOC~~) NOT AT ARMC
Chlamydia: NEGATIVE
Neisseria Gonorrhea: NEGATIVE

## 2018-08-04 ENCOUNTER — Other Ambulatory Visit: Payer: BLUE CROSS/BLUE SHIELD

## 2018-08-04 ENCOUNTER — Other Ambulatory Visit: Payer: Self-pay

## 2018-08-04 DIAGNOSIS — O26851 Spotting complicating pregnancy, first trimester: Secondary | ICD-10-CM

## 2018-08-05 ENCOUNTER — Telehealth: Payer: Self-pay | Admitting: Advanced Practice Midwife

## 2018-08-05 DIAGNOSIS — O3680X0 Pregnancy with inconclusive fetal viability, not applicable or unspecified: Secondary | ICD-10-CM

## 2018-08-05 LAB — BETA HCG QUANT (REF LAB): hCG Quant: 147 m[IU]/mL

## 2018-08-05 NOTE — Telephone Encounter (Signed)
VLTCB . Need to confirm appropriate rise in quant and order for repeat US around 08/16/2018. Will send via MyChart as well  Clayton Bibles, CNM 08/05/18  9:08 AM

## 2018-08-08 ENCOUNTER — Telehealth: Payer: BLUE CROSS/BLUE SHIELD | Admitting: Nurse Practitioner

## 2018-08-08 DIAGNOSIS — B373 Candidiasis of vulva and vagina: Secondary | ICD-10-CM

## 2018-08-08 DIAGNOSIS — B3731 Acute candidiasis of vulva and vagina: Secondary | ICD-10-CM

## 2018-08-08 NOTE — Progress Notes (Signed)
We are sorry that you are not feeling well. Here is how we plan to help! Based on what you shared with me it looks like you: May have a yeast vaginosis  Vaginosis is an inflammation of the vagina that can result in discharge, itching and pain. The cause is usually a change in the normal balance of vaginal bacteria or an infection. Vaginosis can also result from reduced estrogen levels after menopause.  The most common causes of vaginosis are:   Bacterial vaginosis which results from an overgrowth of one on several organisms that are normally present in your vagina.   Yeast infections which are caused by a naturally occurring fungus called candida.   Vaginal atrophy (atrophic vaginosis) which results from the thinning of the vagina from reduced estrogen levels after menopause.   Trichomoniasis which is caused by a parasite and is commonly transmitted by sexual intercourse.  Factors that increase your risk of developing vaginosis include: . Medications, such as antibiotics and steroids . Uncontrolled diabetes . Use of hygiene products such as bubble bath, vaginal spray or vaginal deodorant . Douching . Wearing damp or tight-fitting clothing . Using an intrauterine device (IUD) for birth control . Hormonal changes, such as those associated with pregnancy, birth control pills or menopause . Sexual activity . Having a sexually transmitted infection  Your treatment plan is A single Diflucan (fluconazole) 150mg tablet once.  I have electronically sent this prescription into the pharmacy that you have chosen.  Be sure to take all of the medication as directed. Stop taking any medication if you develop a rash, tongue swelling or shortness of breath. Mothers who are breast feeding should consider pumping and discarding their breast milk while on these antibiotics. However, there is no consensus that infant exposure at these doses would be harmful.  Remember that medication creams can weaken latex  condoms. .   HOME CARE:  Good hygiene may prevent some types of vaginosis from recurring and may relieve some symptoms:  . Avoid baths, hot tubs and whirlpool spas. Rinse soap from your outer genital area after a shower, and dry the area well to prevent irritation. Don't use scented or harsh soaps, such as those with deodorant or antibacterial action. . Avoid irritants. These include scented tampons and pads. . Wipe from front to back after using the toilet. Doing so avoids spreading fecal bacteria to your vagina.  Other things that may help prevent vaginosis include:  . Don't douche. Your vagina doesn't require cleansing other than normal bathing. Repetitive douching disrupts the normal organisms that reside in the vagina and can actually increase your risk of vaginal infection. Douching won't clear up a vaginal infection. . Use a latex condom. Both female and female latex condoms may help you avoid infections spread by sexual contact. . Wear cotton underwear. Also wear pantyhose with a cotton crotch. If you feel comfortable without it, skip wearing underwear to bed. Yeast thrives in moist environments Your symptoms should improve in the next day or two.  GET HELP RIGHT AWAY IF:  . You have pain in your lower abdomen ( pelvic area or over your ovaries) . You develop nausea or vomiting . You develop a fever . Your discharge changes or worsens . You have persistent pain with intercourse . You develop shortness of breath, a rapid pulse, or you faint.  These symptoms could be signs of problems or infections that need to be evaluated by a medical provider now.  MAKE SURE YOU      Understand these instructions.  Will watch your condition.  Will get help right away if you are not doing well or get worse.  Your e-visit answers were reviewed by a board certified advanced clinical practitioner to complete your personal care plan. Depending upon the condition, your plan could have included  both over the counter or prescription medications. Please review your pharmacy choice to make sure that you have choses a pharmacy that is open for you to pick up any needed prescription, Your safety is important to us. If you have drug allergies check your prescription carefully.   You can use MyChart to ask questions about today's visit, request a non-urgent call back, or ask for a work or school excuse for 24 hours related to this e-Visit. If it has been greater than 24 hours you will need to follow up with your provider, or enter a new e-Visit to address those concerns. You will get a MyChart message within the next two days asking about your experience. I hope that your e-visit has been valuable and will speed your recovery.  5 minutes spent reviewing and documenting in chart.  

## 2018-08-09 ENCOUNTER — Telehealth: Payer: Self-pay | Admitting: Family Medicine

## 2018-08-09 NOTE — Telephone Encounter (Signed)
Called in regarding the appointment. Informed the patient of the new ob intake being scheduled at 10-12 weeks. Stated the previous physician at [facility] told her she didn't see anything. The patient was in fear she was no longer pregnant. Stated she is continuously taking pregnancy test. Informed the patient it may have been to early as the patient stated she was about 2 weeks at the time. She is not feeling any discomfort. She does not want to leave a call back message for the nurse. She stated she just wanted to ensure she still has an upcoming doctor's appointment.

## 2018-08-12 ENCOUNTER — Other Ambulatory Visit: Payer: Self-pay

## 2018-08-12 ENCOUNTER — Other Ambulatory Visit: Payer: Self-pay | Admitting: General Practice

## 2018-08-12 ENCOUNTER — Inpatient Hospital Stay (HOSPITAL_COMMUNITY)
Admission: AD | Admit: 2018-08-12 | Discharge: 2018-08-12 | Disposition: A | Discharge status: Home / Self Care | Payer: BLUE CROSS/BLUE SHIELD | Attending: Obstetrics and Gynecology | Admitting: Obstetrics and Gynecology

## 2018-08-12 ENCOUNTER — Encounter (HOSPITAL_COMMUNITY): Payer: Self-pay | Admitting: *Deleted

## 2018-08-12 DIAGNOSIS — O98811 Other maternal infectious and parasitic diseases complicating pregnancy, first trimester: Secondary | ICD-10-CM | POA: Diagnosis not present

## 2018-08-12 DIAGNOSIS — B373 Candidiasis of vulva and vagina: Secondary | ICD-10-CM | POA: Diagnosis not present

## 2018-08-12 DIAGNOSIS — B3731 Acute candidiasis of vulva and vagina: Secondary | ICD-10-CM

## 2018-08-12 DIAGNOSIS — Z3490 Encounter for supervision of normal pregnancy, unspecified, unspecified trimester: Secondary | ICD-10-CM

## 2018-08-12 DIAGNOSIS — Z3A01 Less than 8 weeks gestation of pregnancy: Secondary | ICD-10-CM | POA: Insufficient documentation

## 2018-08-12 DIAGNOSIS — Z888 Allergy status to other drugs, medicaments and biological substances status: Secondary | ICD-10-CM | POA: Insufficient documentation

## 2018-08-12 DIAGNOSIS — Z881 Allergy status to other antibiotic agents status: Secondary | ICD-10-CM | POA: Insufficient documentation

## 2018-08-12 DIAGNOSIS — O26891 Other specified pregnancy related conditions, first trimester: Secondary | ICD-10-CM

## 2018-08-12 LAB — URINALYSIS, ROUTINE W REFLEX MICROSCOPIC
Bilirubin Urine: NEGATIVE
Glucose, UA: NEGATIVE mg/dL
Hgb urine dipstick: NEGATIVE
Ketones, ur: NEGATIVE mg/dL
Leukocytes,Ua: NEGATIVE
Nitrite: NEGATIVE
Protein, ur: NEGATIVE mg/dL
Specific Gravity, Urine: 1.023 (ref 1.005–1.030)
pH: 6 (ref 5.0–8.0)

## 2018-08-12 LAB — WET PREP, GENITAL
Sperm: NONE SEEN
Trich, Wet Prep: NONE SEEN
Yeast Wet Prep HPF POC: NONE SEEN

## 2018-08-12 MED ORDER — TERCONAZOLE 0.8 % VA CREA: 1.0000 | TOPICAL_CREAM | Freq: Every day | VAGINAL | 0 refills | Status: DC

## 2018-08-12 NOTE — Discharge Instructions (Signed)
Vaginal Yeast infection, Adult    Vaginal yeast infection is a condition that causes vaginal discharge as well as soreness, swelling, and redness (inflammation) of the vagina. This is a common condition. Some women get this infection frequently.  What are the causes?  This condition is caused by a change in the normal balance of the yeast (candida) and bacteria that live in the vagina. This change causes an overgrowth of yeast, which causes the inflammation.  What increases the risk?  The condition is more likely to develop in women who:   Take antibiotic medicines.   Have diabetes.   Take birth control pills.   Are pregnant.   Douche often.   Have a weak body defense system (immune system).   Have been taking steroid medicines for a long time.   Frequently wear tight clothing.  What are the signs or symptoms?  Symptoms of this condition include:   White, thick, creamy vaginal discharge.   Swelling, itching, redness, and irritation of the vagina. The lips of the vagina (vulva) may be affected as well.   Pain or a burning feeling while urinating.   Pain during sex.  How is this diagnosed?  This condition is diagnosed based on:   Your medical history.   A physical exam.   A pelvic exam. Your health care provider will examine a sample of your vaginal discharge under a microscope. Your health care provider may send this sample for testing to confirm the diagnosis.  How is this treated?  This condition is treated with medicine. Medicines may be over-the-counter or prescription. You may be told to use one or more of the following:   Medicine that is taken by mouth (orally).   Medicine that is applied as a cream (topically).   Medicine that is inserted directly into the vagina (suppository).  Follow these instructions at home:    Lifestyle   Do not have sex until your health care provider approves. Tell your sex partner that you have a yeast infection. That person should go to his or her health care  provider and ask if they should also be treated.   Do not wear tight clothes, such as pantyhose or tight pants.   Wear breathable cotton underwear.  General instructions   Take or apply over-the-counter and prescription medicines only as told by your health care provider.   Eat more yogurt. This may help to keep your yeast infection from returning.   Do not use tampons until your health care provider approves.   Try taking a sitz bath to help with discomfort. This is a warm water bath that is taken while you are sitting down. The water should only come up to your hips and should cover your buttocks. Do this 3-4 times per day or as told by your health care provider.   Do not douche.   If you have diabetes, keep your blood sugar levels under control.   Keep all follow-up visits as told by your health care provider. This is important.  Contact a health care provider if:   You have a fever.   Your symptoms go away and then return.   Your symptoms do not get better with treatment.   Your symptoms get worse.   You have new symptoms.   You develop blisters in or around your vagina.   You have blood coming from your vagina and it is not your menstrual period.   You develop pain in your abdomen.  Summary     Vaginal yeast infection is a condition that causes discharge as well as soreness, swelling, and redness (inflammation) of the vagina.   This condition is treated with medicine. Medicines may be over-the-counter or prescription.   Take or apply over-the-counter and prescription medicines only as told by your health care provider.   Do not douche. Do not have sex or use tampons until your health care provider approves.   Contact a health care provider if your symptoms do not get better with treatment or your symptoms go away and then return.  This information is not intended to replace advice given to you by your health care provider. Make sure you discuss any questions you have with your health care  provider.  Document Released: 01/15/2005 Document Revised: 08/24/2017 Document Reviewed: 08/24/2017  Elsevier Interactive Patient Education  2019 [employer]

## 2018-08-12 NOTE — MAU Note (Signed)
Presents with c/o yeast infection.  States used OTC Monistat but med caused vaginal burning.  Denies VB

## 2018-08-12 NOTE — MAU Provider Note (Signed)
Chief Complaint: Yeast Infection   First Provider Initiated Contact with Patient 08/12/18 1047     SUBJECTIVE HPI: Tara Sanchez is a 28 y.o. [id number] at 1984w6d who presents to [facility] reporting vaginal irritation. Symptoms started after she took flagyl for BV. Noted minimal amount of clumpy discharge. Took OTC monistat which caused worsening of her symptoms. Has not had intercourse since her last visit when she was tested for STDs. Denies abdominal pain, dysuria, vaginal sores, or vaginal bleeding.   Location: labia Quality: burning Severity: 5/10 on pain scale Duration: 1 week Timing: constant Modifying factors: worse with use of OTC medication Associated signs and symptoms: none  Past Medical History:  Diagnosis Date  . Genital herpes   . Headache   . History of preterm delivery, currently pregnant 04/17/2016   makena  . Infection    UTI  . Medical history non-contributory    OB History  Gravida Para Term Preterm AB Living  7 3 1 2 3 3   SAB TAB Ectopic Multiple Live Births  0 3 0   3    # Outcome Date GA Lbr Len/2nd Weight Sex Delivery Anes PTL Lv  7 Current           6 TAB 2020          5 Term 10/18/16 8466w6d 02:45 / 00:06 2449 g F Vag-Spont None  LIV  4 Preterm 04/26/15 479w2d 07:19 / 00:14 2460 g M Vag-Spont EPI  LIV  3 Preterm 04/20/10 7334w0d   F Vag-Spont   LIV  2 TAB 2010          1 TAB 2009           Past Surgical History:  Procedure Laterality Date  . INDUCED ABORTION    . THERAPEUTIC ABORTION     Social History   Socioeconomic History  . Marital status: Single    Spouse name: Not on file  . Number of children: 3  . Years of education: Not on file  . Highest education level: Not on file  Occupational History  . Not on file  Social Needs  . Financial resource strain: Not on file  . Food insecurity:    Worry: Not on file    Inability: Not on file  . Transportation needs:    Medical: Not on file    Non-medical: Not on file  Tobacco Use  .  Smoking status: Never Smoker  . Smokeless tobacco: Never Used  Substance and Sexual Activity  . Alcohol use: Not Currently    Frequency: Never    Comment: Social  . Drug use: Yes    Types: Marijuana    Comment: Last use 08/01/2018  . Sexual activity: Yes    Birth control/protection: None  Lifestyle  . Physical activity:    Days per week: Not on file    Minutes per session: Not on file  . Stress: Not on file  Relationships  . Social connections:    Talks on phone: Not on file    Gets together: Not on file    Attends religious service: Not on file    Active member of club or organization: Not on file    Attends meetings of clubs or organizations: Not on file    Relationship status: Not on file  . Intimate partner violence:    Fear of current or ex partner: Not on file    Emotionally abused: Not on file    Physically abused: Not  on file    Forced sexual activity: Not on file  Other Topics Concern  . Not on file  Social History Narrative   ** Merged History Encounter **       Family History  Problem Relation Age of Onset  . Healthy Mother   . Healthy Father   . Diabetes Paternal Grandmother   . Asthma Neg Hx   . Cancer Neg Hx   . Heart disease Neg Hx   . Hypertension Neg Hx   . Stroke Neg Hx   . Alcohol abuse Neg Hx   . Arthritis Neg Hx   . Birth defects Neg Hx   . COPD Neg Hx   . Depression Neg Hx   . Drug abuse Neg Hx   . Early death Neg Hx   . Hearing loss Neg Hx   . Hyperlipidemia Neg Hx   . Kidney disease Neg Hx   . Learning disabilities Neg Hx   . Mental illness Neg Hx   . Mental retardation Neg Hx   . Miscarriages / Stillbirths Neg Hx   . Vision loss Neg Hx   . Varicose Veins Neg Hx    No current facility-administered medications on file prior to encounter.    Current Outpatient Medications on File Prior to Encounter  Medication Sig Dispense Refill  . Prenatal Vit-Fe Fumarate-FA (PREPLUS) 27-1 MG TABS Take 1 tablet by mouth daily. 30 tablet 13    Allergies  Allergen Reactions  . Macrobid [Nitrofurantoin Macrocrystal] Anaphylaxis  . Phenergan [Promethazine Hcl] Anaphylaxis  . Tessalon [Benzonatate] Anaphylaxis    I have reviewed patient's Past Medical Hx, Surgical Hx, Family Hx, Social Hx, medications and allergies.   Review of Systems  Gastrointestinal: Negative.   Genitourinary: Positive for vaginal discharge and vaginal pain. Negative for dysuria and vaginal bleeding.    OBJECTIVE Patient Vitals for the past 24 hrs:  BP Temp Temp src Pulse Resp SpO2 Height Weight  08/12/18 1159 (!) 121/54 98.7 F (37.1 C) Oral 80 20 100 % - -  08/12/18 1011 (!) 117/51 98.6 F (37 C) Oral 87 18 100 % - -  08/12/18 1007 - - - - - - 5\' 7"  (1.702 m) 77.3 kg   Constitutional: Well-developed, well-nourished female in no acute distress.  Cardiovascular: normal rate & rhythm, no murmur Respiratory: normal rate and effort. Lung sounds clear throughout GI: Abd soft, non-tender, Pos BS x 4. No guarding or rebound tenderness MS: Extremities nontender, no edema, normal ROM Neurologic: Alert and oriented x 4.  GU: vulva erythematous without lesions. Moderate amount of thick white discharge. Cervix visually closed, pink, & smooth without lesions or friability.      LAB RESULTS Results for orders placed or performed during the hospital encounter of 08/12/18 (from the past 24 hour(s))  Urinalysis, Routine w reflex microscopic     Status: Abnormal   Collection Time: 08/12/18 10:59 AM  Result Value Ref Range   Color, Urine YELLOW YELLOW   APPearance HAZY (A) CLEAR   Specific Gravity, Urine 1.023 1.005 - 1.030   pH 6.0 5.0 - 8.0   Glucose, UA NEGATIVE NEGATIVE mg/dL   Hgb urine dipstick NEGATIVE NEGATIVE   Bilirubin Urine NEGATIVE NEGATIVE   Ketones, ur NEGATIVE NEGATIVE mg/dL   Protein, ur NEGATIVE NEGATIVE mg/dL   Nitrite NEGATIVE NEGATIVE   Leukocytes,Ua NEGATIVE NEGATIVE  Wet prep, genital     Status: Abnormal   Collection Time:  08/12/18 12:00 PM  Result Value Ref Range  Yeast Wet Prep HPF POC NONE SEEN NONE SEEN   Trich, Wet Prep NONE SEEN NONE SEEN   Clue Cells Wet Prep HPF POC PRESENT (A) NONE SEEN   WBC, Wet Prep HPF POC FEW (A) NONE SEEN   Sperm NONE SEEN     IMAGING No results found.  MAU COURSE Orders Placed This Encounter  Procedures  . Wet prep, genital  . Urinalysis, Routine w reflex microscopic  . Discharge patient   Meds ordered this encounter  Medications  . terconazole (TERAZOL 3) 0.8 % vaginal cream    Sig: Place 1 applicator vaginally at bedtime.    Dispense:  20 g    Refill:  0    Order Specific Question:   Supervising Provider    Answer:   Conan Bowens [[id number]]    MDM No abdominal pain or vaginal bleeding.  GC/CT negative on 4/13. Pt denies intercourse since that swab.    ASSESSMENT 1. Vaginal yeast infection     PLAN Discharge home in stable condition. Rx terazol Discussed reasons to return to MAU  Allergies as of 08/12/2018      Reactions   Macrobid [nitrofurantoin Macrocrystal] Anaphylaxis   Phenergan [promethazine Hcl] Anaphylaxis   Tessalon [benzonatate] Anaphylaxis      Medication List    STOP taking these medications   fluticasone 50 MCG/ACT nasal spray Commonly known as:  FLONASE   metroNIDAZOLE 500 MG tablet Commonly known as:  Flagyl   naproxen 500 MG tablet Commonly known as:  NAPROSYN   valACYclovir 500 MG tablet Commonly known as:  Valtrex     TAKE these medications   PrePLUS 27-1 MG Tabs Take 1 tablet by mouth daily.   terconazole 0.8 % vaginal cream Commonly known as:  Terazol 3 Place 1 applicator vaginally at bedtime.        Judeth Horn, NP 08/12/2018  1:31 PM

## 2018-08-13 ENCOUNTER — Ambulatory Visit (INDEPENDENT_AMBULATORY_CARE_PROVIDER_SITE_OTHER): Payer: BLUE CROSS/BLUE SHIELD | Admitting: *Deleted

## 2018-08-13 ENCOUNTER — Other Ambulatory Visit: Payer: BLUE CROSS/BLUE SHIELD

## 2018-08-13 ENCOUNTER — Other Ambulatory Visit: Payer: Self-pay

## 2018-08-13 DIAGNOSIS — Z3490 Encounter for supervision of normal pregnancy, unspecified, unspecified trimester: Secondary | ICD-10-CM | POA: Diagnosis not present

## 2018-08-13 NOTE — Progress Notes (Signed)
Pt presents to clinic for non-stat bhcg.  Pt reports she does not understand why she cannot schedule an appointment to begin prenatal care in a few weeks.  Advised pt that we do not yet have a confirmed IUP and that the bhcg level that was being drawn today would help to determine her plan of care.  Educated to pt that her pregnancy when she went to MAU could be too early to visualize, could be ectopic, or could be a miscarriage.  Pt denies any abdominal pain or bleeding and states that when she went to the emergency room, her spotting was very light.  Advised pt on ectopic precautions.  Pt verbalized understanding. And expressed thanks.

## 2018-08-14 LAB — BETA HCG QUANT (REF LAB): hCG Quant: 3928 m[IU]/mL

## 2018-08-16 ENCOUNTER — Telehealth: Payer: Self-pay | Admitting: Advanced Practice Midwife

## 2018-08-16 DIAGNOSIS — F3181 Bipolar II disorder: Secondary | ICD-10-CM | POA: Diagnosis not present

## 2018-08-16 NOTE — Progress Notes (Signed)
I have reviewed the chart and agree with nursing staff's documentation of this patient's encounter.  [place] Bing, MD 08/16/2018 9:21 AM

## 2018-08-17 ENCOUNTER — Telehealth: Payer: Self-pay | Admitting: Advanced Practice Midwife

## 2018-08-17 ENCOUNTER — Telehealth: Payer: Self-pay | Admitting: Family Medicine

## 2018-08-17 ENCOUNTER — Telehealth (INDEPENDENT_AMBULATORY_CARE_PROVIDER_SITE_OTHER): Payer: BLUE CROSS/BLUE SHIELD | Admitting: Family Medicine

## 2018-08-17 DIAGNOSIS — Z3400 Encounter for supervision of normal first pregnancy, unspecified trimester: Secondary | ICD-10-CM

## 2018-08-17 NOTE — Telephone Encounter (Signed)
Called pt and pt informed me that she was suppose to get an [name] scheduled.  Per chart review Lelon Mast CNM requested that pt has an [name] scheduled around 08/16/18.  Contacted [name] and scheduled her appt for

## 2018-08-17 NOTE — Telephone Encounter (Signed)
Opened in error

## 2018-08-17 NOTE — Telephone Encounter (Signed)
Patient called very upset because she want to be scheduled for an Ultra sound, she said she was given a number to a place to get her [name] and they told her they don't do them there. Patient is requesting a call back for a [name] Appt

## 2018-08-17 NOTE — Telephone Encounter (Signed)
Patient showed appropriate rise in quant but does not yet appear to have scheduled the ultrasound I ordered for her (ordered 04/16 with target date of 04/27). Voicemail left requesting call back. Will duplicate in MyChart message.  Clayton Bibles, CNM 08/17/18  10:02 AM

## 2018-08-19 ENCOUNTER — Encounter: Payer: Self-pay | Admitting: General Practice

## 2018-08-19 ENCOUNTER — Other Ambulatory Visit: Payer: Self-pay

## 2018-08-19 ENCOUNTER — Ambulatory Visit (HOSPITAL_COMMUNITY)
Admission: RE | Admit: 2018-08-19 | Discharge: 2018-08-19 | Disposition: A | Discharge status: Home / Self Care | Payer: BLUE CROSS/BLUE SHIELD | Source: Ambulatory Visit | Attending: Student | Admitting: Student

## 2018-08-19 DIAGNOSIS — Z3A00 Weeks of gestation of pregnancy not specified: Secondary | ICD-10-CM | POA: Diagnosis not present

## 2018-08-19 DIAGNOSIS — O26891 Other specified pregnancy related conditions, first trimester: Secondary | ICD-10-CM | POA: Diagnosis not present

## 2018-08-19 DIAGNOSIS — Z3201 Encounter for pregnancy test, result positive: Secondary | ICD-10-CM | POA: Diagnosis not present

## 2018-08-19 DIAGNOSIS — R109 Unspecified abdominal pain: Secondary | ICD-10-CM | POA: Diagnosis not present

## 2018-08-30 ENCOUNTER — Ambulatory Visit (INDEPENDENT_AMBULATORY_CARE_PROVIDER_SITE_OTHER): Payer: BLUE CROSS/BLUE SHIELD

## 2018-08-30 ENCOUNTER — Other Ambulatory Visit: Payer: Self-pay

## 2018-08-30 DIAGNOSIS — O0990 Supervision of high risk pregnancy, unspecified, unspecified trimester: Secondary | ICD-10-CM | POA: Insufficient documentation

## 2018-08-30 MED ORDER — AMBULATORY NON FORMULARY MEDICATION: 1.0000 | 0 refills | Status: DC

## 2018-08-30 NOTE — Progress Notes (Addendum)
I connected with  Tara Sanchez on 08/30/18 at  8:15 AM EDT by telephone and verified that I am speaking with the correct person using two identifiers.   I discussed the limitations, risks, security and privacy concerns of performing an evaluation and management service by telephone and the availability of in person appointments. I also discussed with the patient that there may be a patient responsible charge related to this service. The patient expressed understanding and agreed to proceed.  Pt history obtained today.  Pt denies any vaginal bleeding and pain.  Medications reconciled.  Pt informed that provider may do a pap smear due to her last one being in 2017.  I also advised pt to think about genetic screening as that will be offered to her along with her initial prenatal labs that will be drawn that day.  Pt stated understanding. Pt advised that she will need to download Babyscripts and that she will be checking her BP once a week.  I explained to the pt that she should receive a cuff in the mail and if she does not to please call the office in a week so that we can make sure that she has the cuff to bring with her to face to face appt so that we can make sure that she knows how to use it.   Pt verbalized understanding.  Erie Noe to fax information for BP cuff to [facility] at [phone] and fax # [phone].    Ralene Bathe, RN 08/30/2018  8:40 AM

## 2018-09-07 ENCOUNTER — Encounter (HOSPITAL_COMMUNITY): Payer: Self-pay | Admitting: Student

## 2018-09-07 ENCOUNTER — Other Ambulatory Visit: Payer: Self-pay

## 2018-09-07 ENCOUNTER — Inpatient Hospital Stay (HOSPITAL_COMMUNITY)
Admission: AD | Admit: 2018-09-07 | Discharge: 2018-09-07 | Disposition: A | Discharge status: Home / Self Care | Payer: BLUE CROSS/BLUE SHIELD | Attending: Family Medicine | Admitting: Family Medicine

## 2018-09-07 DIAGNOSIS — Z3A09 9 weeks gestation of pregnancy: Secondary | ICD-10-CM | POA: Insufficient documentation

## 2018-09-07 DIAGNOSIS — N760 Acute vaginitis: Secondary | ICD-10-CM

## 2018-09-07 DIAGNOSIS — F3181 Bipolar II disorder: Secondary | ICD-10-CM | POA: Insufficient documentation

## 2018-09-07 DIAGNOSIS — Z888 Allergy status to other drugs, medicaments and biological substances status: Secondary | ICD-10-CM | POA: Diagnosis not present

## 2018-09-07 DIAGNOSIS — Z883 Allergy status to other anti-infective agents status: Secondary | ICD-10-CM | POA: Diagnosis not present

## 2018-09-07 DIAGNOSIS — Z833 Family history of diabetes mellitus: Secondary | ICD-10-CM | POA: Insufficient documentation

## 2018-09-07 DIAGNOSIS — M549 Dorsalgia, unspecified: Secondary | ICD-10-CM | POA: Insufficient documentation

## 2018-09-07 DIAGNOSIS — Z881 Allergy status to other antibiotic agents status: Secondary | ICD-10-CM | POA: Insufficient documentation

## 2018-09-07 DIAGNOSIS — R109 Unspecified abdominal pain: Secondary | ICD-10-CM | POA: Diagnosis not present

## 2018-09-07 DIAGNOSIS — O99341 Other mental disorders complicating pregnancy, first trimester: Secondary | ICD-10-CM | POA: Insufficient documentation

## 2018-09-07 DIAGNOSIS — O0990 Supervision of high risk pregnancy, unspecified, unspecified trimester: Secondary | ICD-10-CM

## 2018-09-07 DIAGNOSIS — O23591 Infection of other part of genital tract in pregnancy, first trimester: Secondary | ICD-10-CM

## 2018-09-07 DIAGNOSIS — O26891 Other specified pregnancy related conditions, first trimester: Secondary | ICD-10-CM

## 2018-09-07 DIAGNOSIS — O0991 Supervision of high risk pregnancy, unspecified, first trimester: Secondary | ICD-10-CM | POA: Diagnosis not present

## 2018-09-07 DIAGNOSIS — B9689 Other specified bacterial agents as the cause of diseases classified elsewhere: Secondary | ICD-10-CM | POA: Insufficient documentation

## 2018-09-07 DIAGNOSIS — O9989 Other specified diseases and conditions complicating pregnancy, childbirth and the puerperium: Secondary | ICD-10-CM | POA: Diagnosis not present

## 2018-09-07 HISTORY — DX: Bipolar II disorder: F31.81

## 2018-09-07 LAB — URINALYSIS, ROUTINE W REFLEX MICROSCOPIC
Bilirubin Urine: NEGATIVE
Glucose, UA: NEGATIVE mg/dL
Hgb urine dipstick: NEGATIVE
Ketones, ur: NEGATIVE mg/dL
Leukocytes,Ua: NEGATIVE
Nitrite: NEGATIVE
Protein, ur: NEGATIVE mg/dL
Specific Gravity, Urine: 1.021 (ref 1.005–1.030)
pH: 7 (ref 5.0–8.0)

## 2018-09-07 LAB — WET PREP, GENITAL
Sperm: NONE SEEN
Trich, Wet Prep: NONE SEEN
Yeast Wet Prep HPF POC: NONE SEEN

## 2018-09-07 MED ORDER — METRONIDAZOLE 500 MG PO TABS: 500.0000 mg | ORAL_TABLET | Freq: Two times a day (BID) | ORAL | 0 refills | Status: AC

## 2018-09-07 NOTE — MAU Note (Signed)
Neg CVA tenderness. 

## 2018-09-07 NOTE — Discharge Instructions (Signed)
Abdominal Pain During Pregnancy  Abdominal pain is common during pregnancy, and has many possible causes. Some causes are more serious than others, and sometimes the cause is not known. Abdominal pain can be a sign that labor is starting. It can also be caused by normal growth and stretching of muscles and ligaments during pregnancy. Always tell your health care provider if you have any abdominal pain. Follow these instructions at home:  Do not have sex or put anything in your vagina until your pain goes away completely.  Get plenty of rest until your pain improves.  Drink enough fluid to keep your urine pale yellow.  Take over-the-counter and prescription medicines only as told by your health care provider.  Keep all follow-up visits as told by your health care provider. This is important. Contact a health care provider if:  Your pain continues or gets worse after resting.  You have lower abdominal pain that: ? Comes and goes at regular intervals. ? Spreads to your back. ? Is similar to menstrual cramps.  You have pain or burning when you urinate. Get help right away if:  You have a fever or chills.  You have vaginal bleeding.  You are leaking fluid from your vagina.  You are passing tissue from your vagina.  You have vomiting or diarrhea that lasts for more than 24 hours.  Your baby is moving less than usual.  You feel very weak or faint.  You have shortness of breath.  You develop severe pain in your upper abdomen. Summary  Abdominal pain is common during pregnancy, and has many possible causes.  If you experience abdominal pain during pregnancy, tell your health care provider right away.  Follow your health care provider's home care instructions and keep all follow-up visits as directed. This information is not intended to replace advice given to you by your health care provider. Make sure you discuss any questions you have with your health care  provider. Document Released: 04/07/2005 Document Revised: 07/10/2016 Document Reviewed: 07/10/2016 Elsevier Interactive Patient Education  2019 Elsevier Inc.  Bacterial Vaginosis  Bacterial vaginosis is a vaginal infection that occurs when the normal balance of bacteria in the vagina is disrupted. It results from an overgrowth of certain bacteria. This is the most common vaginal infection among women ages 73-44. Because bacterial vaginosis increases your risk for STIs (sexually transmitted infections), getting treated can help reduce your risk for chlamydia, gonorrhea, herpes, and HIV (human immunodeficiency virus). Treatment is also important for preventing complications in pregnant women, because this condition can cause an early (premature) delivery. What are the causes? This condition is caused by an increase in harmful bacteria that are normally present in small amounts in the vagina. However, the reason that the condition develops is not fully understood. What increases the risk? The following factors may make you more likely to develop this condition:  Having a new sexual partner or multiple sexual partners.  Having unprotected sex.  Douching.  Having an intrauterine device (IUD).  Smoking.  Drug and alcohol abuse.  Taking certain antibiotic medicines.  Being pregnant. You cannot get bacterial vaginosis from toilet seats, bedding, swimming pools, or contact with objects around you. What are the signs or symptoms? Symptoms of this condition include:  Grey or white vaginal discharge. The discharge can also be watery or foamy.  A fish-like odor with discharge, especially after sexual intercourse or during menstruation.  Itching in and around the vagina.  Burning or pain with urination. Some  women with bacterial vaginosis have no signs or symptoms. How is this diagnosed? This condition is diagnosed based on:  Your medical history.  A physical exam of the  vagina.  Testing a sample of vaginal fluid under a microscope to look for a large amount of bad bacteria or abnormal cells. Your health care provider may use a cotton swab or a small wooden spatula to collect the sample. How is this treated? This condition is treated with antibiotics. These may be given as a pill, a vaginal cream, or a medicine that is put into the vagina (suppository). If the condition comes back after treatment, a second round of antibiotics may be needed. Follow these instructions at home: Medicines  Take over-the-counter and prescription medicines only as told by your health care provider.  Take or use your antibiotic as told by your health care provider. Do not stop taking or using the antibiotic even if you start to feel better. General instructions  If you have a female sexual partner, tell her that you have a vaginal infection. She should see her health care provider and be treated if she has symptoms. If you have a female sexual partner, he does not need treatment.  During treatment: ? Avoid sexual activity until you finish treatment. ? Do not douche. ? Avoid alcohol as directed by your health care provider. ? Avoid breastfeeding as directed by your health care provider.  Drink enough water and fluids to keep your urine clear or pale yellow.  Keep the area around your vagina and rectum clean. ? Wash the area daily with warm water. ? Wipe yourself from front to back after using the toilet.  Keep all follow-up visits as told by your health care provider. This is important. How is this prevented?  Do not douche.  Wash the outside of your vagina with warm water only.  Use protection when having sex. This includes latex condoms and dental dams.  Limit how many sexual partners you have. To help prevent bacterial vaginosis, it is best to have sex with just one partner (monogamous).  Make sure you and your sexual partner are tested for STIs.  Wear cotton or  cotton-lined underwear.  Avoid wearing tight pants and pantyhose, especially during summer.  Limit the amount of alcohol that you drink.  Do not use any products that contain nicotine or tobacco, such as cigarettes and e-cigarettes. If you need help quitting, ask your health care provider.  Do not use illegal drugs. Where to find more information  Centers for Disease Control and Prevention: SolutionApps.co.zawww.cdc.gov/std  American Sexual Health Association (ASHA): www.ashastd.org  U.S. Department of Health and Health and safety inspectorHuman Services, Office on Women's Health: ConventionalMedicines.siwww.womenshealth.gov/ or http://www.anderson-williamson.info/https://www.womenshealth.gov/a-z-topics/bacterial-vaginosis Contact a health care provider if:  Your symptoms do not improve, even after treatment.  You have more discharge or pain when urinating.  You have a fever.  You have pain in your abdomen.  You have pain during sex.  You have vaginal bleeding between periods. Summary  Bacterial vaginosis is a vaginal infection that occurs when the normal balance of bacteria in the vagina is disrupted.  Because bacterial vaginosis increases your risk for STIs (sexually transmitted infections), getting treated can help reduce your risk for chlamydia, gonorrhea, herpes, and HIV (human immunodeficiency virus). Treatment is also important for preventing complications in pregnant women, because the condition can cause an early (premature) delivery.  This condition is treated with antibiotic medicines. These may be given as a pill, a vaginal cream, or a medicine  that is put into the vagina (suppository). This information is not intended to replace advice given to you by your health care provider. Make sure you discuss any questions you have with your health care provider. Document Released: 04/07/2005 Document Revised: 08/11/2016 Document Reviewed: 12/22/2015 Elsevier Interactive Patient Education  2019 [employer].                  Safe Medications in Pregnancy    Acne: Benzoyl  Peroxide Salicylic Acid  Backache/Headache: Tylenol: 2 regular strength every 4 hours OR              2 Extra strength every 6 hours  Colds/Coughs/Allergies: Benadryl (alcohol free) 25 mg every 6 hours as needed Breath right strips Claritin Cepacol throat lozenges Chloraseptic throat spray Cold-Eeze- up to three times per day Cough drops, alcohol free Flonase (by prescription only) Guaifenesin Mucinex Robitussin DM (plain only, alcohol free) Saline nasal spray/drops Sudafed (pseudoephedrine) & Actifed ** use only after [redacted] weeks gestation and if you do not have high blood pressure Tylenol Vicks Vaporub Zinc lozenges Zyrtec   Constipation: Colace Ducolax suppositories Fleet enema Glycerin suppositories Metamucil Milk of magnesia Miralax Senokot Smooth move tea  Diarrhea: Kaopectate Imodium A-D  *NO pepto Bismol  Hemorrhoids: Anusol Anusol HC Preparation H Tucks  Indigestion: Tums Maalox Mylanta Zantac  Pepcid  Insomnia: Benadryl (alcohol free)  every 6 hours as needed Tylenol PM Unisom, no Gelcaps  Leg Cramps: Tums MagGel  Nausea/Vomiting:  Bonine Dramamine Emetrol Ginger extract Sea bands Meclizine  Nausea medication to take during pregnancy:  Unisom (doxylamine succinate 25 mg tablets) Take one tablet daily at bedtime. If symptoms are not adequately controlled, the dose can be increased to a maximum recommended dose of two tablets daily (1/2 tablet in the morning, 1/2 tablet mid-afternoon and one at bedtime). Vitamin B6  tablets. Take one tablet twice a day (up to 200 mg per day).  Skin Rashes: Aveeno products Benadryl cream or  every 6 hours as needed Calamine Lotion 1% cortisone cream  Yeast infection: Gyne-lotrimin 7 Monistat 7   **If taking multiple medications, please check labels to avoid duplicating the same active ingredients **take medication as directed on the label ** Do not exceed 4000 mg of tylenol in  24 hours **Do not take medications that contain aspirin or ibuprofen

## 2018-09-07 NOTE — MAU Note (Signed)
Having pain in RLQ and lower back (?rt flank area). No bleeding.  Not throwing up. Called dr and was told to come here.  Pain started 2 days ago.comes and goes, esp at night.

## 2018-09-07 NOTE — MAU Provider Note (Signed)
History     CSN: [id number]  Arrival date and time: 09/07/18 1216   First Provider Initiated Contact with Patient 09/07/18 1253      Chief Complaint  Patient presents with  . Abdominal Pain  . Back Pain   Tara Sanchez is a 28 y.o. G7P1233 at [redacted]w[redacted]d who presents to MAU for "not feeling pregnant" due to lack of N/V and lack of frequent urination. Pt also reports right-sided LBP, radiating around to RLQ.  Onset: two nights ago Location: RLQ, right-sided LBP Duration: ~48hrs Character: "like a tooth ache, long lasting," constant at night, intermittent during the day, worse at night Aggravating/Associated: stomach sleeping/none Relieving: none Treatment: none Severity: 7/10 at worst  Pt also reports frequent douching, increased vaginal discharge and foul odor.  Pt denies VB. Pt denies N/V, abdominal pain, constipation, diarrhea, or urinary problems. Pt denies fever, chills, fatigue, sweating or changes in appetite. Pt denies SOB or chest pain. Pt denies dizziness, HA, light-headedness, weakness.  Problems this pregnancy include: hx of PTD. Allergies? Macrobid, phenergan, tessalon Current medications/supplements? PNVs Prenatal care provider? [facility], next appt.1wk   OB History    Gravida  7   Para  3   Term  1   Preterm  2   AB  3   Living  3     SAB  0   TAB  3   Ectopic  0   Multiple      Live Births  3           Past Medical History:  Diagnosis Date  . Bipolar 2 disorder (HCC)   . Genital herpes   . Headache   . History of preterm delivery, currently pregnant 04/17/2016   makena  . Infection    UTI  . Preterm labor     Past Surgical History:  Procedure Laterality Date  . INDUCED ABORTION    . THERAPEUTIC ABORTION      Family History  Problem Relation Age of Onset  . Healthy Mother   . Healthy Father   . Diabetes Paternal Grandmother   . Asthma Neg Hx   . Cancer Neg Hx   . Heart disease Neg Hx   . Hypertension Neg Hx   .  Stroke Neg Hx   . Alcohol abuse Neg Hx   . Arthritis Neg Hx   . Birth defects Neg Hx   . COPD Neg Hx   . Depression Neg Hx   . Drug abuse Neg Hx   . Early death Neg Hx   . Hearing loss Neg Hx   . Hyperlipidemia Neg Hx   . Kidney disease Neg Hx   . Learning disabilities Neg Hx   . Mental illness Neg Hx   . Mental retardation Neg Hx   . Miscarriages / Stillbirths Neg Hx   . Vision loss Neg Hx   . Varicose Veins Neg Hx     Social History   Tobacco Use  . Smoking status: Never Smoker  . Smokeless tobacco: Never Used  Substance Use Topics  . Alcohol use: Not Currently    Frequency: Never    Comment: Social  . Drug use: Not Currently    Types: Marijuana    Comment: Last use 08/01/2018    Allergies:  Allergies  Allergen Reactions  . Macrobid [Nitrofurantoin Macrocrystal] Anaphylaxis  . Phenergan [Promethazine Hcl] Anaphylaxis  . Tessalon [Benzonatate] Anaphylaxis    Medications Prior to Admission  Medication Sig Dispense Refill Last Dose  .   AMBULATORY NON FORMULARY MEDICATION 1 Device by Other route once a week. Blood pressure cuff - regular Monitoring regularly at home ICD 10: Z39.40 1 kit 0   . Prenatal Vit-Fe Fumarate-FA (PREPLUS) 27-1 MG TABS Take 1 tablet by mouth daily. 30 tablet 13 Taking    Review of Systems  Constitutional: Negative for chills, diaphoresis, fatigue and fever.  Respiratory: Negative for shortness of breath.   Cardiovascular: Negative for chest pain.  Gastrointestinal: Positive for abdominal pain. Negative for constipation, diarrhea, nausea and vomiting.  Genitourinary: Positive for vaginal discharge. Negative for dysuria, flank pain, frequency, pelvic pain, urgency and vaginal bleeding.  Neurological: Negative for dizziness, weakness, light-headedness and headaches.   Physical Exam   Blood pressure (!) 108/49, pulse 83, temperature 98.1 F (36.7 C), temperature source Oral, resp. rate 18, weight 79.8 kg, last menstrual period 07/02/2018,  SpO2 100 %, unknown if currently breastfeeding.  Patient Vitals for the past 24 hrs:  BP Temp Temp src Pulse Resp SpO2 Weight  09/07/18 1249 (!) 108/49 - - 83 - - -  09/07/18 1231 (!) 107/49 98.1 F (36.7 C) Oral 77 18 100 % 79.8 kg   Physical Exam  Constitutional: She is oriented to person, place, and time. She appears well-developed and well-nourished. No distress.  HENT:  Head: Normocephalic and atraumatic.  Respiratory: Effort normal.  GI: Soft. She exhibits no distension and no mass. There is no abdominal tenderness. There is no rebound and no guarding.  Genitourinary: There is no rash, tenderness or lesion on the right labia. There is no rash, tenderness or lesion on the left labia. Uterus is not fixed and not tender. Cervix exhibits no motion tenderness, no discharge and no friability. Right adnexum displays no mass, no tenderness and no fullness. Left adnexum displays no mass, no tenderness and no fullness.    Vaginal discharge (thin, white discharge with fish-like odor) present.     No vaginal tenderness or bleeding.  No tenderness or bleeding in the vagina.  Neurological: She is alert and oriented to person, place, and time.  Skin: Skin is warm and dry. She is not diaphoretic.  Psychiatric: She has a normal mood and affect. Her behavior is normal. Judgment and thought content normal.   Results for orders placed or performed during the hospital encounter of 09/07/18 (from the past 24 hour(s))  Urinalysis, Routine w reflex microscopic     Status: Abnormal   Collection Time: 09/07/18 12:40 PM  Result Value Ref Range   Color, Urine YELLOW YELLOW   APPearance HAZY (A) CLEAR   Specific Gravity, Urine 1.021 1.005 - 1.030   pH 7.0 5.0 - 8.0   Glucose, UA NEGATIVE NEGATIVE mg/dL   Hgb urine dipstick NEGATIVE NEGATIVE   Bilirubin Urine NEGATIVE NEGATIVE   Ketones, ur NEGATIVE NEGATIVE mg/dL   Protein, ur NEGATIVE NEGATIVE mg/dL   Nitrite NEGATIVE NEGATIVE   Leukocytes,Ua NEGATIVE  NEGATIVE   RBC / HPF 0-5 0 - 5 RBC/hpf   WBC, UA 11-20 0 - 5 WBC/hpf   Bacteria, UA RARE (A) NONE SEEN   Squamous Epithelial / LPF 6-10 0 - 5   Mucus PRESENT   Wet prep, genital     Status: Abnormal   Collection Time: 09/07/18  1:10 PM  Result Value Ref Range   Yeast Wet Prep HPF POC NONE SEEN NONE SEEN   Trich, Wet Prep NONE SEEN NONE SEEN   Clue Cells Wet Prep HPF POC PRESENT (A) NONE SEEN   WBC, Wet  Prep HPF POC FEW (A) NONE SEEN   Sperm NONE SEEN     US Ob Transvaginal  Result Date: 08/19/2018 CLINICAL DATA:  28 year old pregnant female presents for assessment of fetal dating and viability. No acute symptoms reported. Quantitative beta hCG 3,928 on 08/13/2018. EDC by LMP: 04/08/2019, projecting to an expected gestational age of [redacted] weeks 6 days. EXAM: TRANSVAGINAL OB ULTRASOUND TECHNIQUE: Transvaginal ultrasound was performed for complete evaluation of the gestation as well as the maternal uterus, adnexal regions, and pelvic cul-de-sac. COMPARISON:  08/02/2018 obstetric scan. FINDINGS: Intrauterine gestational sac: Single Yolk sac:  Visualized. Embryo:  Visualized. Cardiac Activity: Visualized. Heart Rate: 110 bpm CRL:   4.5 mm   6 w 1 d                  [name] EDC: 04/13/2019 Subchorionic hemorrhage:  None visualized. Maternal uterus/adnexae: Anteverted uterus. No uterine fibroids. Right ovary measures 3.3 x 1.5 x 2.4 cm. Left ovary measures 3.1 x 2.5 x 2.1 cm and contains a corpus luteum. No abnormal ovarian or adnexal masses. No abnormal free fluid in the pelvis. IMPRESSION: 1. Single living intrauterine gestation at 6 weeks 1 day by crown-rump length, without significant discrepancy with provided menstrual dating. Embryonic heart rate 110 bpm, low normal at this early gestational age. Follow-up obstetric scan may be considered in 1 month to assess for appropriate fetal growth, as clinically warranted. 2. No ovarian or adnexal abnormality. Electronically Signed   By: Ilona Sorrel M.D.   On:  08/19/2018 10:16   MAU Course  Procedures  MDM -suspect BV -UA: hazy/rare bacteria, sending urine for culture based on pt sx -WetPrep: +ClueCells, few WBCs, otherwise WNL -GC/CT collected -Bedside US (with Jorje Guild, NP): FHR 162 -pt discharged to home in stable condition  Orders Placed This Encounter  Procedures  . Wet prep, genital    Standing Status:   Standing    Number of Occurrences:   1  . Culture, OB Urine    Standing Status:   Standing    Number of Occurrences:   1  . Urinalysis, Routine w reflex microscopic    Standing Status:   Standing    Number of Occurrences:   1  . Discharge patient    Order Specific Question:   Discharge disposition    Answer:   01-Home or Self Care [1]    Order Specific Question:   Discharge patient date    Answer:   09/07/2018   Meds ordered this encounter  Medications  . metroNIDAZOLE (FLAGYL) 500 MG tablet    Sig: Take 1 tablet (500 mg total) by mouth 2 (two) times daily for 7 days.    Dispense:  14 tablet    Refill:  0    Order Specific Question:   Supervising Provider    Answer:   Donnamae Jude [[id number]]   Assessment and Plan   1. Abdominal pain during pregnancy in first trimester   2. Supervision of high risk pregnancy, antepartum   3. Bacterial vaginosis   4. [redacted] weeks gestation of pregnancy    Allergies as of 09/07/2018      Reactions   Macrobid [nitrofurantoin Macrocrystal] Anaphylaxis   Phenergan [promethazine Hcl] Anaphylaxis   Tessalon [benzonatate] Anaphylaxis      Medication List    TAKE these medications   AMBULATORY NON FORMULARY MEDICATION 1 Device by Other route once a week. Blood pressure cuff - regular Monitoring regularly at home ICD 10: Z39.40  metroNIDAZOLE 500 MG tablet Commonly known as:  Flagyl Take 1 tablet (500 mg total) by mouth 2 (two) times daily for 7 days.   PrePLUS 27-1 MG Tabs Take 1 tablet by mouth daily.      -discussed appropriate vaginal and vulvar hygiene -discussed expected  pregnancy course including normal s/sx -will call with culture results, if positive -return MAU precautions given -pt discharged to home in stable condition   E  09/07/2018, 2:08 PM  

## 2018-09-08 LAB — GC/CHLAMYDIA PROBE AMP (~~LOC~~) NOT AT ARMC
Chlamydia: NEGATIVE
Neisseria Gonorrhea: NEGATIVE

## 2018-09-09 LAB — CULTURE, OB URINE

## 2018-09-14 ENCOUNTER — Telehealth: Payer: Self-pay | Admitting: Student

## 2018-09-14 NOTE — Telephone Encounter (Signed)
Called the patient to inform of the upcoming appointment. Informed the patient of wearing a face mask and no visitors due to COVID19 restrictions. °

## 2018-09-15 ENCOUNTER — Encounter: Payer: Self-pay | Admitting: *Deleted

## 2018-09-15 ENCOUNTER — Ambulatory Visit (INDEPENDENT_AMBULATORY_CARE_PROVIDER_SITE_OTHER): Payer: BLUE CROSS/BLUE SHIELD | Admitting: Obstetrics & Gynecology

## 2018-09-15 ENCOUNTER — Other Ambulatory Visit: Payer: Self-pay

## 2018-09-15 VITALS — BP 104/64 | HR 84 | Temp 98.0°F | Wt 172.6 lb

## 2018-09-15 DIAGNOSIS — O0990 Supervision of high risk pregnancy, unspecified, unspecified trimester: Secondary | ICD-10-CM

## 2018-09-15 DIAGNOSIS — O0991 Supervision of high risk pregnancy, unspecified, first trimester: Secondary | ICD-10-CM

## 2018-09-15 DIAGNOSIS — Z3A10 10 weeks gestation of pregnancy: Secondary | ICD-10-CM

## 2018-09-15 NOTE — Progress Notes (Signed)
  Subjective:    Tara Sanchez is being seen today for her first obstetrical visit. P3 (9, 8, and 2 y kids) This is not a planned pregnancy. She is at [redacted]w[redacted]d gestation. Her obstetrical history is significant for preterm delivery, last baby was at 39 weeks. She declined 17 P.  Relationship with FOB: friend. Patient does intend to breast feed. Pregnancy history fully reviewed.  Patient reports no complaints.  Review of Systems:   Review of Systems  Works at [employer]  Objective:     BP 104/64   Pulse 84   Temp 98 F (36.7 C)   Wt 172 lb 9.6 oz (78.3 kg)   LMP 07/02/2018   BMI 27.03 kg/m  Physical Exam  Exam Breathing, conversing, and ambulating normally Well nourished, well hydrated Black female, no apparent distress Heart- rrr Lungs- CTAB Abd- benign   Assessment:    Pregnancy: [id number] Patient Active Problem List   Diagnosis Date Noted  . Supervision of high risk pregnancy, antepartum 08/30/2018  . Maternal varicella, non-immune 01/09/2015       Plan:     Initial labs drawn. Prenatal vitamins. Problem list reviewed and updated. Role of ultrasound in pregnancy discussed; fetal survey: scheduled for 11/05/18 Amniocentesis discussed: not indicated. H/o PTD with first baby, last baby at 49 weeks, she declines 17 P Baby scripts [facility] in 2 weeks   Christiaan Strebeck C Leeloo Silverthorne 09/15/2018

## 2018-09-16 LAB — OBSTETRIC PANEL, INCLUDING HIV
Antibody Screen: NEGATIVE
Basophils Absolute: 0 10*3/uL (ref 0.0–0.2)
Basos: 0 %
EOS (ABSOLUTE): 0.1 10*3/uL (ref 0.0–0.4)
Eos: 1 %
HIV Screen 4th Generation wRfx: NONREACTIVE
Hematocrit: 40.6 % (ref 34.0–46.6)
Hemoglobin: 13.5 g/dL (ref 11.1–15.9)
Hepatitis B Surface Ag: NEGATIVE
Immature Grans (Abs): 0 10*3/uL (ref 0.0–0.1)
Immature Granulocytes: 0 %
Lymphocytes Absolute: 2.2 10*3/uL (ref 0.7–3.1)
Lymphs: 21 %
MCH: 31.4 pg (ref 26.6–33.0)
MCHC: 33.3 g/dL (ref 31.5–35.7)
MCV: 94 fL (ref 79–97)
Monocytes Absolute: 0.8 10*3/uL (ref 0.1–0.9)
Monocytes: 7 %
Neutrophils Absolute: 7.6 10*3/uL — ABNORMAL HIGH (ref 1.4–7.0)
Neutrophils: 71 %
Platelets: 265 10*3/uL (ref 150–450)
RBC: 4.3 x10E6/uL (ref 3.77–5.28)
RDW: 13.2 % (ref 11.7–15.4)
RPR Ser Ql: NONREACTIVE
Rh Factor: POSITIVE
Rubella Antibodies, IGG: 11.1 index (ref >0.99)
WBC: 10.7 10*3/uL (ref 3.4–10.8)

## 2018-09-17 ENCOUNTER — Encounter: Payer: BLUE CROSS/BLUE SHIELD | Admitting: Obstetrics and Gynecology

## 2018-09-17 ENCOUNTER — Encounter: Payer: Self-pay | Admitting: Obstetrics & Gynecology

## 2018-09-28 ENCOUNTER — Telehealth: Payer: Self-pay | Admitting: Family Medicine

## 2018-09-28 NOTE — Telephone Encounter (Signed)
Attempted to call patient to complete her screening for her appointment on 6/10 @ 10:30. No answer, left voicemail for patient to wear a face mask for the entire appointment and no visitors are allowed with her. Patient also instructed that if she has any symptoms ( fever, chills,cough, shortness of breath, any flu-like symptoms, headache, muscle pain, sore throat diarrhea or loss of smell/taste to not come to her appointment instead give the office a call back to be rescheduled. Office number was left

## 2018-09-29 ENCOUNTER — Other Ambulatory Visit: Payer: Self-pay

## 2018-09-29 ENCOUNTER — Other Ambulatory Visit: Payer: BC Managed Care – PPO

## 2018-09-29 DIAGNOSIS — O0990 Supervision of high risk pregnancy, unspecified, unspecified trimester: Secondary | ICD-10-CM

## 2018-09-29 DIAGNOSIS — Z3481 Encounter for supervision of other normal pregnancy, first trimester: Secondary | ICD-10-CM | POA: Diagnosis not present

## 2018-10-12 ENCOUNTER — Encounter: Payer: Self-pay | Admitting: *Deleted

## 2018-10-12 ENCOUNTER — Telehealth: Payer: Self-pay | Admitting: Medical

## 2018-10-12 NOTE — Telephone Encounter (Signed)
Called the patient to inform of the upcoming mychart visit. The patient verbalized understanding. °

## 2018-10-13 ENCOUNTER — Telehealth (INDEPENDENT_AMBULATORY_CARE_PROVIDER_SITE_OTHER): Payer: BC Managed Care – PPO | Admitting: Medical

## 2018-10-13 ENCOUNTER — Other Ambulatory Visit: Payer: Self-pay

## 2018-10-13 DIAGNOSIS — Z3A14 14 weeks gestation of pregnancy: Secondary | ICD-10-CM

## 2018-10-13 DIAGNOSIS — O0992 Supervision of high risk pregnancy, unspecified, second trimester: Secondary | ICD-10-CM

## 2018-10-13 DIAGNOSIS — O0990 Supervision of high risk pregnancy, unspecified, unspecified trimester: Secondary | ICD-10-CM

## 2018-10-13 DIAGNOSIS — O09892 Supervision of other high risk pregnancies, second trimester: Secondary | ICD-10-CM | POA: Insufficient documentation

## 2018-10-13 DIAGNOSIS — O09212 Supervision of pregnancy with history of pre-term labor, second trimester: Secondary | ICD-10-CM

## 2018-10-13 NOTE — Progress Notes (Signed)
Pt has active Medicaid # [id number], Issues May 01,2020, retro-active March thru April.

## 2018-10-13 NOTE — Progress Notes (Signed)
   PRENATAL VISIT NOTE  Subjective:  Tara Sanchez is a 28 y.o. [phone] at [redacted]w[redacted]d being seen today for ongoing prenatal care.  She is currently monitored for the following issues for this high-risk pregnancy and has Maternal varicella, non-immune; Supervision of high risk pregnancy, antepartum; and History of preterm delivery, currently pregnant in second trimester on their problem list.  Patient reports no complaints.  Contractions: Not present. Vag. Bleeding: None.  Movement: Present. Denies leaking of fluid.   The following portions of the patient's history were reviewed and updated as appropriate: allergies, current medications, past family history, past medical history, past social history, past surgical history and problem list.   Objective:   Vitals:   10/13/18 1139  BP: 112/76  Pulse: 85    Fetal Status:     Movement: Present     General:  Alert, oriented and cooperative. Patient is in no acute distress.  Skin: Skin is warm and dry. No rash noted.   Cardiovascular: Normal heart rate noted  Respiratory: Normal respiratory effort, no problems with respiration noted  Abdomen: Soft, gravid, appropriate for gestational age.  Pain/Pressure: Present     Pelvic: Cervical exam deferred        Extremities: Normal range of motion.  Edema: Trace  Mental Status: Normal mood and affect. Normal behavior. Normal judgment and thought content.   Assessment and Plan:  Pregnancy: [id number] at [redacted]w[redacted]d 1. Supervision of high risk pregnancy, antepartum - Patient doing well, concerned that she does not appear pregnant yet - Discussed that weight gain at this point is appropriate and we will continue to monitor - Concerned because she has not been able to hear the FHR with doppler due to virtual visits - Admit that she went to MAU in order to hear it  - Advised patient to call the office if she would like to hear the FHR instead of going to the MAU - Patient presents to office today for doppler which  was normal and she is reassured - Anatomy US scheduled 7/16  2. History of preterm delivery, currently pregnant in second trimester - Had a 28 week PTD and a 36 week PTD, then a full term delivery since then - Declined 17-P  Preterm labor/ second trimester warning symptoms and general obstetric precautions including but not limited to vaginal bleeding, contractions, leaking of fluid and fetal movement were reviewed in detail with the patient. Please refer to After Visit Summary for other counseling recommendations.   No follow-ups on file.  Future Appointments  Date Time Provider [place]  11/05/2018  1:30 PM [place] [facility]  11/05/2018  1:30 PM [name] [facility]    Kerry Hough, PA-C

## 2018-10-18 DIAGNOSIS — Z3490 Encounter for supervision of normal pregnancy, unspecified, unspecified trimester: Secondary | ICD-10-CM | POA: Diagnosis not present

## 2018-10-19 DIAGNOSIS — F3181 Bipolar II disorder: Secondary | ICD-10-CM | POA: Diagnosis not present

## 2018-11-01 ENCOUNTER — Other Ambulatory Visit: Payer: Self-pay | Admitting: General Practice

## 2018-11-01 DIAGNOSIS — B379 Candidiasis, unspecified: Secondary | ICD-10-CM

## 2018-11-01 MED ORDER — TERCONAZOLE 0.4 % VA CREA: 1.0000 | TOPICAL_CREAM | Freq: Every day | VAGINAL | 0 refills | Status: DC

## 2018-11-02 ENCOUNTER — Other Ambulatory Visit: Payer: Self-pay

## 2018-11-02 ENCOUNTER — Inpatient Hospital Stay (HOSPITAL_COMMUNITY)
Admission: AD | Admit: 2018-11-02 | Discharge: 2018-11-02 | Disposition: A | Discharge status: Home / Self Care | Payer: BC Managed Care – PPO | Attending: Obstetrics and Gynecology | Admitting: Obstetrics and Gynecology

## 2018-11-02 ENCOUNTER — Encounter (HOSPITAL_COMMUNITY): Payer: Self-pay | Admitting: *Deleted

## 2018-11-02 DIAGNOSIS — Z202 Contact with and (suspected) exposure to infections with a predominantly sexual mode of transmission: Secondary | ICD-10-CM

## 2018-11-02 DIAGNOSIS — O98812 Other maternal infectious and parasitic diseases complicating pregnancy, second trimester: Secondary | ICD-10-CM | POA: Insufficient documentation

## 2018-11-02 DIAGNOSIS — Z3A17 17 weeks gestation of pregnancy: Secondary | ICD-10-CM

## 2018-11-02 DIAGNOSIS — B3731 Acute candidiasis of vulva and vagina: Secondary | ICD-10-CM

## 2018-11-02 DIAGNOSIS — B373 Candidiasis of vulva and vagina: Secondary | ICD-10-CM | POA: Insufficient documentation

## 2018-11-02 DIAGNOSIS — N898 Other specified noninflammatory disorders of vagina: Secondary | ICD-10-CM | POA: Diagnosis not present

## 2018-11-02 LAB — URINALYSIS, ROUTINE W REFLEX MICROSCOPIC
Bilirubin Urine: NEGATIVE
Glucose, UA: NEGATIVE mg/dL
Hgb urine dipstick: NEGATIVE
Ketones, ur: NEGATIVE mg/dL
Nitrite: NEGATIVE
Protein, ur: NEGATIVE mg/dL
Specific Gravity, Urine: 1.02 (ref 1.005–1.030)
pH: 7 (ref 5.0–8.0)

## 2018-11-02 LAB — WET PREP, GENITAL
Sperm: NONE SEEN
Trich, Wet Prep: NONE SEEN
Yeast Wet Prep HPF POC: NONE SEEN

## 2018-11-02 MED ORDER — AZITHROMYCIN 250 MG PO TABS
1000.0000 mg | ORAL_TABLET | Freq: Once | ORAL | Status: AC
  Administered 2018-11-02: 1000 mg via ORAL
  Filled 2018-11-02: qty 4

## 2018-11-02 MED ORDER — CEFTRIAXONE SODIUM 250 MG IJ SOLR
250.0000 mg | Freq: Once | INTRAMUSCULAR | Status: AC
  Administered 2018-11-02: 250 mg via INTRAMUSCULAR
  Filled 2018-11-02: qty 250

## 2018-11-02 MED ORDER — TERCONAZOLE 0.4 % VA CREA: 1.0000 | TOPICAL_CREAM | Freq: Every day | VAGINAL | 0 refills | Status: AC

## 2018-11-02 NOTE — MAU Provider Note (Signed)
History     CSN: [id number]  Arrival date and time: 11/02/18 1045   First Provider Initiated Contact with Patient 11/02/18 1253      Chief Complaint  Patient presents with  . Vaginal Discharge   Tara Sanchez is a 28 y.o. [phone] at 68w4dwho presents today with vulvar itching and irritation. She states that she had intercourse with a new partner a couple of days ago, and now she is feeling irritated. She would like to have treatment for gonorrhea and chlamydia today.   Vaginal Discharge The patient's primary symptoms include genital itching and vaginal discharge. This is a new problem. The current episode started in the past 7 days. The problem occurs constantly. The problem has been unchanged. The patient is experiencing no pain. She is pregnant. The vaginal discharge was white. There has been no bleeding. The symptoms are aggravated by intercourse. She has tried nothing for the symptoms. She is sexually active. It is unknown whether or not her partner has an STD.    OB History    Gravida  7   Para  3   Term  1   Preterm  2   AB  3   Living  3     SAB  0   TAB  3   Ectopic  0   Multiple      Live Births  3           Past Medical History:  Diagnosis Date  . [place] 2 [place]   . Genital herpes   . Headache   . History of preterm delivery, currently pregnant 04/17/2016   [place]  . Infection    UTI  . Preterm labor     Past Surgical History:  Procedure Laterality Date  . INDUCED ABORTION    . THERAPEUTIC ABORTION      Family History  Problem Relation Age of Onset  . Healthy Mother   . Healthy Father   . Diabetes Paternal Grandmother   . Asthma Neg Hx   . Cancer Neg Hx   . Heart disease Neg Hx   . Hypertension Neg Hx   . Stroke Neg Hx   . Alcohol abuse Neg Hx   . Arthritis Neg Hx   . Birth defects Neg Hx   . COPD Neg Hx   . Depression Neg Hx   . Drug abuse Neg Hx   . Early death Neg Hx   . Hearing loss Neg Hx   . Hyperlipidemia  Neg Hx   . Kidney disease Neg Hx   . Learning disabilities Neg Hx   . Mental illness Neg Hx   . Mental retardation Neg Hx   . Miscarriages / Stillbirths Neg Hx   . Vision loss Neg Hx   . Varicose Veins Neg Hx     Social History   Tobacco Use  . Smoking status: Never Smoker  . Smokeless tobacco: Never Used  Substance Use Topics  . Alcohol use: Not Currently    Frequency: Never    Comment: Social  . Drug use: Not Currently    Types: Marijuana    Comment: Last use 08/01/2018    Allergies:  Allergies  Allergen Reactions  . Macrobid [Nitrofurantoin Macrocrystal] Anaphylaxis  . Phenergan [Promethazine Hcl] Anaphylaxis  . Tessalon [Benzonatate] Anaphylaxis    Medications Prior to Admission  Medication Sig Dispense Refill Last Dose  . Prenatal Vit-Fe Fumarate-FA (PREPLUS) 27-1 MG TABS Take 1 tablet by mouth daily.  30 tablet 13 Past Month at Unknown time  . AMBULATORY NON FORMULARY MEDICATION 1 Device by Other route once a week. Blood pressure cuff - regular Monitoring regularly at home ICD 10: Z39.40 (Patient not taking: Reported on 10/13/2018) 1 kit 0   . terconazole (TERAZOL 7) 0.4 % vaginal cream Place 1 applicator vaginally at bedtime. 45 g 0     Review of Systems  Genitourinary: Positive for vaginal discharge.  All other systems reviewed and are negative.  Physical Exam   Blood pressure (!) 114/55, pulse 75, temperature 98.2 F (36.8 C), resp. rate 16, last menstrual period 07/02/2018, SpO2 100 %, unknown if currently breastfeeding.  Physical Exam  Nursing note and vitals reviewed. Constitutional: She is oriented to person, place, and time. She appears well-developed and well-nourished. No distress.  HENT:  Head: Normocephalic.  Cardiovascular: Normal rate.  Respiratory: Effort normal.  GI: Soft. There is no abdominal tenderness. There is no rebound.  Genitourinary:    Genitourinary Comments:  External: no lesion Vagina: small amount of white discharge Cervix:  pink, smooth, no CMT, closed/thick  Uterus: AGA +FHT 140 with doppler     Neurological: She is alert and oriented to person, place, and time.  Skin: Skin is warm and dry.  Psychiatric: She has a normal mood and affect.   Results for orders placed or performed during the hospital encounter of 11/02/18 (from the past 24 hour(s))  Wet prep, genital     Status: Abnormal   Collection Time: 11/02/18  1:00 PM  Result Value Ref Range   Yeast Wet Prep HPF POC NONE SEEN NONE SEEN   Trich, Wet Prep NONE SEEN NONE SEEN   Clue Cells Wet Prep HPF POC PRESENT (A) NONE SEEN   WBC, Wet Prep HPF POC MANY (A) NONE SEEN   Sperm NONE SEEN   Urinalysis, Routine w reflex microscopic     Status: Abnormal   Collection Time: 11/02/18  1:02 PM  Result Value Ref Range   Color, Urine YELLOW YELLOW   APPearance HAZY (A) CLEAR   Specific Gravity, Urine 1.020 1.005 - 1.030   pH 7.0 5.0 - 8.0   Glucose, UA NEGATIVE NEGATIVE mg/dL   Hgb urine dipstick NEGATIVE NEGATIVE   Bilirubin Urine NEGATIVE NEGATIVE   Ketones, ur NEGATIVE NEGATIVE mg/dL   Protein, ur NEGATIVE NEGATIVE mg/dL   Nitrite NEGATIVE NEGATIVE   Leukocytes,Ua TRACE (A) NEGATIVE   RBC / HPF 0-5 0 - 5 RBC/hpf   WBC, UA 11-20 0 - 5 WBC/hpf   Bacteria, UA RARE (A) NONE SEEN   Squamous Epithelial / LPF 0-5 0 - 5   Mucus PRESENT      MAU Course  Procedures  MDM Patient treated here with '250mg'$  Rocephin and 1,'000mg'$  azithromycin per her request.   Assessment and Plan   1. Candida vaginitis   2. [redacted] weeks gestation of pregnancy   3. Possible exposure to STD    DC home Comfort measures reviewed  2nd Trimester precautions  RX: terazol 7 as directed  Return to MAU as needed FU with OB as planned   Marcille Buffy DNP, CNM  11/02/18  2:04 PM

## 2018-11-02 NOTE — MAU Note (Signed)
Tara Sanchez is a 28 y.o. at [redacted]w[redacted]d here in MAU reporting: vaginal itching that started a week ago after intercourse  Onset of complaint: a week Pain score:  Vitals:   11/02/18 1246  BP: (!) 114/55  Pulse: 75  Resp: 16  Temp: 98.2 F (36.8 C)  SpO2: 100%     FHT:140 Lab orders placed from triage: UA

## 2018-11-03 LAB — GC/CHLAMYDIA PROBE AMP (~~LOC~~) NOT AT ARMC
Chlamydia: NEGATIVE
Neisseria Gonorrhea: NEGATIVE

## 2018-11-05 ENCOUNTER — Other Ambulatory Visit (HOSPITAL_COMMUNITY): Payer: Self-pay | Admitting: *Deleted

## 2018-11-05 ENCOUNTER — Other Ambulatory Visit: Payer: Self-pay

## 2018-11-05 ENCOUNTER — Ambulatory Visit (HOSPITAL_COMMUNITY)
Admission: RE | Admit: 2018-11-05 | Discharge: 2018-11-05 | Disposition: A | Discharge status: Home / Self Care | Payer: BC Managed Care – PPO | Source: Ambulatory Visit | Attending: Obstetrics and Gynecology | Admitting: Obstetrics and Gynecology

## 2018-11-05 ENCOUNTER — Encounter (HOSPITAL_COMMUNITY): Payer: Self-pay

## 2018-11-05 ENCOUNTER — Ambulatory Visit (HOSPITAL_COMMUNITY): Payer: BC Managed Care – PPO | Admitting: *Deleted

## 2018-11-05 DIAGNOSIS — O09212 Supervision of pregnancy with history of pre-term labor, second trimester: Secondary | ICD-10-CM | POA: Diagnosis not present

## 2018-11-05 DIAGNOSIS — Z362 Encounter for other antenatal screening follow-up: Secondary | ICD-10-CM

## 2018-11-05 DIAGNOSIS — Z3A17 17 weeks gestation of pregnancy: Secondary | ICD-10-CM

## 2018-11-05 DIAGNOSIS — O0990 Supervision of high risk pregnancy, unspecified, unspecified trimester: Secondary | ICD-10-CM | POA: Diagnosis not present

## 2018-11-09 ENCOUNTER — Telehealth: Payer: Self-pay | Admitting: Family Medicine

## 2018-11-09 NOTE — Telephone Encounter (Signed)
Attempted to reach patient about making an appointment. Was not able to leave a message.

## 2018-11-10 ENCOUNTER — Telehealth: Payer: Self-pay | Admitting: Obstetrics & Gynecology

## 2018-11-10 NOTE — Telephone Encounter (Signed)
The patient stated she received a message via mychart to call in and schedule an appointment. The previous check out note did not have a follow up appointment to schedule. Informed the patient of being scheduled for a mychart visit this week. She stated she does want to complete a mychart visit. She stated she how can a doctor check her through mychart? They don't (cursed) but stare at me and tell me (cursed)  I already know. Informed the patient that due to the covid19 restriction she will have mychart visit up until the 28 week mark. Apologized and informed the patient of the restriction being in place for her safety and ours. She stated there are other offices that are seeing patients and we are the only one who are not. She stated she will come over or schedule an appointment when she has her ultrasound and her baby is measuring small. We would have known that if we were doing face to face visits. Sympathized with the patient and tried to explain the importance of being scheduled however the patient hungup.

## 2018-11-26 ENCOUNTER — Other Ambulatory Visit: Payer: Self-pay

## 2018-11-26 ENCOUNTER — Inpatient Hospital Stay (HOSPITAL_COMMUNITY)
Admission: AD | Admit: 2018-11-26 | Discharge: 2018-11-26 | Disposition: A | Discharge status: Home / Self Care | Payer: Medicaid Other | Attending: Obstetrics and Gynecology | Admitting: Obstetrics and Gynecology

## 2018-11-26 ENCOUNTER — Encounter (HOSPITAL_COMMUNITY): Payer: Self-pay

## 2018-11-26 ENCOUNTER — Inpatient Hospital Stay (HOSPITAL_COMMUNITY): Payer: Medicaid Other

## 2018-11-26 DIAGNOSIS — R109 Unspecified abdominal pain: Secondary | ICD-10-CM | POA: Diagnosis present

## 2018-11-26 DIAGNOSIS — O98512 Other viral diseases complicating pregnancy, second trimester: Secondary | ICD-10-CM | POA: Diagnosis not present

## 2018-11-26 DIAGNOSIS — B349 Viral infection, unspecified: Secondary | ICD-10-CM | POA: Insufficient documentation

## 2018-11-26 DIAGNOSIS — O26892 Other specified pregnancy related conditions, second trimester: Secondary | ICD-10-CM | POA: Diagnosis not present

## 2018-11-26 DIAGNOSIS — Z20828 Contact with and (suspected) exposure to other viral communicable diseases: Secondary | ICD-10-CM | POA: Insufficient documentation

## 2018-11-26 DIAGNOSIS — Z3A20 20 weeks gestation of pregnancy: Secondary | ICD-10-CM | POA: Insufficient documentation

## 2018-11-26 DIAGNOSIS — R509 Fever, unspecified: Secondary | ICD-10-CM

## 2018-11-26 DIAGNOSIS — R0602 Shortness of breath: Secondary | ICD-10-CM

## 2018-11-26 LAB — SARS CORONAVIRUS 2 BY RT PCR (HOSPITAL ORDER, PERFORMED IN ~~LOC~~ HOSPITAL LAB): SARS Coronavirus 2: NEGATIVE

## 2018-11-26 LAB — URINALYSIS, ROUTINE W REFLEX MICROSCOPIC
Bilirubin Urine: NEGATIVE
Glucose, UA: NEGATIVE mg/dL
Hgb urine dipstick: NEGATIVE
Ketones, ur: NEGATIVE mg/dL
Nitrite: NEGATIVE
Protein, ur: NEGATIVE mg/dL
Specific Gravity, Urine: 1.024 (ref 1.005–1.030)
pH: 6 (ref 5.0–8.0)

## 2018-11-26 MED ORDER — ACETAMINOPHEN 500 MG PO TABS
1000.0000 mg | ORAL_TABLET | Freq: Once | ORAL | Status: AC
  Administered 2018-11-26: 1000 mg via ORAL
  Filled 2018-11-26: qty 2

## 2018-11-26 NOTE — Discharge Instructions (Signed)
Safe Medications in Pregnancy   Acne: Benzoyl Peroxide Salicylic Acid  Backache/Headache: Tylenol: 2 regular strength every 4 hours OR              2 Extra strength every 6 hours  Colds/Coughs/Allergies: Benadryl (alcohol free) 25 mg every 6 hours as needed Breath right strips Claritin Cepacol throat lozenges Chloraseptic throat spray Cold-Eeze- up to three times per day Cough drops, alcohol free Flonase (by prescription only) Guaifenesin Mucinex Robitussin DM (plain only, alcohol free) Saline nasal spray/drops Sudafed (pseudoephedrine) & Actifed ** use only after [redacted] weeks gestation and if you do not have high blood pressure Tylenol Vicks Vaporub Zinc lozenges Zyrtec   Constipation: Colace Ducolax suppositories Fleet enema Glycerin suppositories Metamucil Milk of magnesia Miralax Senokot Smooth move tea  Diarrhea: Kaopectate Imodium A-D  *NO pepto Bismol  Hemorrhoids: Anusol Anusol HC Preparation H Tucks  Indigestion: Tums Maalox Mylanta Zantac  Pepcid  Insomnia: Benadryl (alcohol free) 25mg  every 6 hours as needed Tylenol PM Unisom, no Gelcaps  Leg Cramps: Tums MagGel  Nausea/Vomiting:  Bonine Dramamine Emetrol Ginger extract Sea bands Meclizine  Nausea medication to take during pregnancy:  Unisom (doxylamine succinate 25 mg tablets) Take one tablet daily at bedtime. If symptoms are not adequately controlled, the dose can be increased to a maximum recommended dose of two tablets daily (1/2 tablet in the morning, 1/2 tablet mid-afternoon and one at bedtime). Vitamin B6 100mg  tablets. Take one tablet twice a day (up to 200 mg per day).  Skin Rashes: Aveeno products Benadryl cream or 25mg  every 6 hours as needed Calamine Lotion 1% cortisone cream  Yeast infection: Gyne-lotrimin 7 Monistat 7  Gum/tooth pain: Anbesol  **If taking multiple medications, please check labels to avoid duplicating the same active ingredients **take  medication as directed on the label ** Do not exceed 4000 mg of tylenol in 24 hours **Do not take medications that contain aspirin or ibuprofen      Viral Respiratory Infection A viral respiratory infection is an illness that affects parts of the body that are used for breathing. These include the lungs, nose, and throat. It is caused by a germ called a virus. Some examples of this kind of infection are:  A cold.  The flu (influenza).  A respiratory syncytial virus (RSV) infection. A person who gets this illness may have the following symptoms:  A stuffy or runny nose.  Yellow or green fluid in the nose.  A cough.  Sneezing.  Tiredness (fatigue).  Achy muscles.  A sore throat.  Sweating or chills.  A fever.  A headache. Follow these instructions at home: Managing pain and congestion  Take over-the-counter and prescription medicines only as told by your doctor.  If you have a sore throat, gargle with salt water. Do this 3-4 times per day or as needed. To make a salt-water mixture, dissolve -1 tsp of salt in 1 cup of warm water. Make sure that all the salt dissolves.  Use nose drops made from salt water. This helps with stuffiness (congestion). It also helps soften the skin around your nose.  Drink enough fluid to keep your pee (urine) pale yellow. General instructions   Rest as much as possible.  Do not drink alcohol.  Do not use any products that have nicotine or tobacco, such as cigarettes and e-cigarettes. If you need help quitting, ask your doctor.  Keep all follow-up visits as told by your doctor. This is important. How is this prevented?   Get  a flu shot every year. Ask your doctor when you should get your flu shot.  Do not let other people get your germs. If you are sick: ? Stay home from work or school. ? Wash your hands with soap and water often. Wash your hands after you cough or sneeze. If soap and water are not available, use hand  sanitizer.  Avoid contact with people who are sick during cold and flu season. This is in fall and winter. Get help if:  Your symptoms last for 10 days or longer.  Your symptoms get worse over time.  You have a fever.  You have very bad pain in your face or forehead.  Parts of your jaw or neck become very swollen. Get help right away if:  You feel pain or pressure in your chest.  You have shortness of breath.  You faint or feel like you will faint.  You keep throwing up (vomiting).  You feel confused. Summary  A viral respiratory infection is an illness that affects parts of the body that are used for breathing.  Examples of this illness include a cold, the flu, and respiratory syncytial virus (RSV) infection.  The infection can cause a runny nose, cough, sneezing, sore throat, and fever.  Follow what your doctor tells you about taking medicines, drinking lots of fluid, washing your hands, resting at home, and avoiding people who are sick. This information is not intended to replace advice given to you by your health care provider. Make sure you discuss any questions you have with your health care provider. Document Released: 03/20/2008 Document Revised: 04/15/2018 Document Reviewed: 05/18/2017 Elsevier Patient Education  2020 [employer]

## 2018-11-26 NOTE — MAU Note (Signed)
Pt has has had generalized body aches. sharp lower abdominal pain and shortness of breath since last night, which has gotten worse. Had temp of 100 at home. Has sore throat.  Denies VB or LOF. Unsure of fetal movement.

## 2018-11-26 NOTE — MAU Provider Note (Signed)
Chief Complaint: Generalized Body Aches, Fever, Abdominal Pain, and Shortness of Breath   First Provider Initiated Contact with Patient 11/26/18 1347     SUBJECTIVE HPI: Tara Sanchez is a 28 y.o. [id number] at 7070w2d who presents to Maternity Admissions reporting fever & body aches. Symptoms started last night. Her children were recently sick with "allergies" & had negative Covid testing. She has not been tested. She denies any other exposures, she currently is not working, and has not traveled.  Symptoms include SOB that is worse when wearing her mask, sore throat, fever, & body aches. Has not taken anything for her symptoms. Denies cough, chest pain, or ear pain. She called her ob/gyn today & was told to come to MAU for evaluation.  Also reports some sharp pains yesterday & is concerned due to her history of preterm delivery. Denies continued pain. Denies LOF or vaginal bleeding.   Location: myalgias Quality: aching Severity: 9/10 on pain scale Duration: 1 day Timing: constant Modifying factors: hasn't treated symptoms. Nothing makes better or worse Associated signs and symptoms: SOB, fever  Past Medical History:  Diagnosis Date  . Bipolar 2 disorder (HCC)   . Genital herpes   . Headache   . History of preterm delivery, currently pregnant 04/17/2016   makena  . Infection    UTI  . Preterm labor    OB History  Gravida Para Term Preterm AB Living  7 3 1 2 3 3   SAB TAB Ectopic Multiple Live Births  0 3 0   3    # Outcome Date GA Lbr Len/2nd Weight Sex Delivery Anes PTL Lv  7 Current           6 TAB 2020          5 Term 10/18/16 1525w6d 02:45 / 00:06 2449 g F Vag-Spont None  LIV  4 Preterm 04/26/15 2570w2d 07:19 / 00:14 2460 g M Vag-Spont EPI  LIV  3 Preterm 04/20/10 4225w0d   F Vag-Spont   LIV  2 TAB 2010          1 TAB 2009           Past Surgical History:  Procedure Laterality Date  . INDUCED ABORTION    . THERAPEUTIC ABORTION     Social History   Socioeconomic History  .  Marital status: Single    Spouse name: Not on file  . Number of children: 3  . Years of education: Not on file  . Highest education level: Not on file  Occupational History  . Not on file  Social Needs  . Financial resource strain: Not on file  . Food insecurity    Worry: Never true    Inability: Never true  . Transportation needs    Medical: No    Non-medical: No  Tobacco Use  . Smoking status: Never Smoker  . Smokeless tobacco: Never Used  Substance and Sexual Activity  . Alcohol use: Not Currently    Frequency: Never    Comment: Social  . Drug use: Not Currently    Types: Marijuana    Comment: Last use 08/01/2018  . Sexual activity: Yes    Birth control/protection: None  Lifestyle  . Physical activity    Days per week: Not on file    Minutes per session: Not on file  . Stress: Not on file  Relationships  . Social Musicianconnections    Talks on phone: Not on file    Gets together: Not on  file    Attends religious service: Not on file    Active member of club or organization: Not on file    Attends meetings of clubs or organizations: Not on file    Relationship status: Not on file  . Intimate partner violence    Fear of current or ex partner: Not on file    Emotionally abused: Not on file    Physically abused: Not on file    Forced sexual activity: Not on file  Other Topics Concern  . Not on file  Social History Narrative   ** Merged History Encounter **       Family History  Problem Relation Age of Onset  . Healthy Mother   . Healthy Father   . Diabetes Paternal Grandmother   . Asthma Neg Hx   . Cancer Neg Hx   . Heart disease Neg Hx   . Hypertension Neg Hx   . Stroke Neg Hx   . Alcohol abuse Neg Hx   . Arthritis Neg Hx   . Birth defects Neg Hx   . COPD Neg Hx   . Depression Neg Hx   . Drug abuse Neg Hx   . Early death Neg Hx   . Hearing loss Neg Hx   . Hyperlipidemia Neg Hx   . Kidney disease Neg Hx   . Learning disabilities Neg Hx   . Mental  illness Neg Hx   . Mental retardation Neg Hx   . Miscarriages / Stillbirths Neg Hx   . Vision loss Neg Hx   . Varicose Veins Neg Hx    No current facility-administered medications on file prior to encounter.    Current Outpatient Medications on File Prior to Encounter  Medication Sig Dispense Refill  . Prenatal Vit-Fe Fumarate-FA (PREPLUS) 27-1 MG TABS Take 1 tablet by mouth daily. 30 tablet 13   Allergies  Allergen Reactions  . Macrobid [Nitrofurantoin Macrocrystal] Anaphylaxis  . Phenergan [Promethazine Hcl] Anaphylaxis  . Tessalon [Benzonatate] Anaphylaxis    I have reviewed patient's Past Medical Hx, Surgical Hx, Family Hx, Social Hx, medications and allergies.   Review of Systems  Constitutional: Positive for chills and fever.  HENT: Positive for congestion and sore throat. Negative for ear pain and sinus pain.   Respiratory: Positive for shortness of breath. Negative for cough, chest tightness and wheezing.   Cardiovascular: Negative for chest pain.  Gastrointestinal: Positive for abdominal pain. Negative for constipation, diarrhea, nausea and vomiting.  Genitourinary: Negative.   Musculoskeletal: Positive for myalgias.    OBJECTIVE Patient Vitals for the past 24 hrs:  BP Temp Temp src Pulse Resp SpO2  11/26/18 1810 128/67 - - - - -  11/26/18 1629 - 99.3 F (37.4 C) - - - -  11/26/18 1345 - - - - - 100 %  11/26/18 1340 - - - - - 100 %  11/26/18 1339 (!) 104/43 - - 100 - -  11/26/18 1335 - - - - - 100 %  11/26/18 1329 - - Oral (!) 102 18 -  11/26/18 1328 120/64 - - 98 - -  11/26/18 1326 - (!) 101.8 F (38.8 C) - - - -   Constitutional: Pt appears ill Cardiovascular: Mild tachycardia. normal rhythm, no murmur Respiratory: normal rate and effort. Lung sounds clear throughout.  GI: Abd soft, non-tender, Pos BS x 4. No guarding or rebound tenderness MS: Extremities nontender, no edema, normal ROM Neurologic: Alert and oriented x 4.  GU: Dilation:  Closed Effacement (%):  Thick Exam by:: Jorje Guild NP    LAB RESULTS Results for orders placed or performed during the hospital encounter of 11/26/18 (from the past 24 hour(s))  Urinalysis, Routine w reflex microscopic     Status: Abnormal   Collection Time: 11/26/18  2:37 PM  Result Value Ref Range   Color, Urine YELLOW YELLOW   APPearance HAZY (A) CLEAR   Specific Gravity, Urine 1.024 1.005 - 1.030   pH 6.0 5.0 - 8.0   Glucose, UA NEGATIVE NEGATIVE mg/dL   Hgb urine dipstick NEGATIVE NEGATIVE   Bilirubin Urine NEGATIVE NEGATIVE   Ketones, ur NEGATIVE NEGATIVE mg/dL   Protein, ur NEGATIVE NEGATIVE mg/dL   Nitrite NEGATIVE NEGATIVE   Leukocytes,Ua TRACE (A) NEGATIVE   RBC / HPF 0-5 0 - 5 RBC/hpf   WBC, UA 0-5 0 - 5 WBC/hpf   Bacteria, UA RARE (A) NONE SEEN   Squamous Epithelial / LPF 0-5 0 - 5   Mucus PRESENT   SARS Coronavirus 2 [facility] order, Performed in [facility] hospital lab) Nasopharyngeal Nasopharyngeal Swab     Status: None   Collection Time: 11/26/18  2:37 PM   Specimen: Nasopharyngeal Swab  Result Value Ref Range   SARS Coronavirus 2 NEGATIVE NEGATIVE    IMAGING Dg Chest Port 1 View  Result Date: 11/26/2018 CLINICAL DATA:  Shortness of breath and fever EXAM: PORTABLE CHEST 1 VIEW COMPARISON:  07/05/2018 FINDINGS: Mildly low lung volumes. The heart appears slightly enlarged. No focal opacity or pleural effusion. No pneumothorax. IMPRESSION: 1. No focal pulmonary infiltrate. 2. Interval mild cardiomegaly Electronically Signed   By: Donavan Foil M.D.   On: 11/26/2018 15:04    MAU COURSE Orders Placed This Encounter  Procedures  . SARS Coronavirus 2 [facility] order, Performed in [facility] hospital lab) Nasopharyngeal Nasopharyngeal Swab  . DG CHEST PORT 1 VIEW  . Urinalysis, Routine w reflex microscopic  . Diet regular Room service appropriate? Yes; Fluid consistency: Thin  . Airborne and Contact precautions  . ED EKG  . Discharge patient   Meds ordered  this encounter  Medications  . acetaminophen (TYLENOL) tablet 1,000 mg    MDM FHT present via doppler  Pt febrile with complaint of SOB. States she mainly feels SOB when she's wearing her mask but that worsened last night. No cough. Lungs clear throughout. EKG normal & chest xray negative. SpO2 100% Covid swab negative Fever treated with tylenol 1 gm. Temp improved & pt reports improvement in symptoms.   Abdominal pain yesterday that has not continued. Pt wants cervical exam d/t hx of preterm delivery. Cervix closed/thick/firm.   ASSESSMENT 1. Viral illness   2. SOB (shortness of breath)   3. Fever   4. [redacted] weeks gestation of pregnancy     PLAN Discharge home in stable condition. Discussed reasons to return to MAU Take tylenol prn fever/body aches Given list of OTC meds safe in pregnancy  Follow-up Information    [facility] Follow up.   Specialty: Obstetrics and Gynecology Why: return for worsening symptoms Contact information: [address] [id number] [place] [phone]         Allergies as of 11/26/2018      Reactions   Macrobid [nitrofurantoin Macrocrystal] Anaphylaxis   Phenergan [promethazine Hcl] Anaphylaxis   Tessalon [benzonatate] Anaphylaxis      Medication List    STOP taking these medications   AMBULATORY NON FORMULARY MEDICATION     TAKE these medications  PrePLUS 27-1 MG Tabs Take 1 tablet by mouth daily.        Judeth HornLawrence, Kazaria Gaertner, NP 11/26/2018  7:38 PM

## 2018-12-02 ENCOUNTER — Encounter (HOSPITAL_COMMUNITY): Payer: Self-pay

## 2018-12-02 ENCOUNTER — Ambulatory Visit (HOSPITAL_COMMUNITY)
Admission: RE | Admit: 2018-12-02 | Discharge: 2018-12-02 | Disposition: A | Discharge status: Home / Self Care | Payer: Medicaid Other | Source: Ambulatory Visit | Attending: Obstetrics and Gynecology | Admitting: Obstetrics and Gynecology

## 2018-12-02 ENCOUNTER — Other Ambulatory Visit: Payer: Self-pay

## 2018-12-02 ENCOUNTER — Ambulatory Visit (HOSPITAL_COMMUNITY): Payer: Medicaid Other | Admitting: *Deleted

## 2018-12-02 DIAGNOSIS — Z3A21 21 weeks gestation of pregnancy: Secondary | ICD-10-CM | POA: Diagnosis not present

## 2018-12-02 DIAGNOSIS — Z362 Encounter for other antenatal screening follow-up: Secondary | ICD-10-CM

## 2018-12-02 DIAGNOSIS — O0990 Supervision of high risk pregnancy, unspecified, unspecified trimester: Secondary | ICD-10-CM

## 2018-12-02 DIAGNOSIS — O09212 Supervision of pregnancy with history of pre-term labor, second trimester: Secondary | ICD-10-CM | POA: Diagnosis not present

## 2018-12-13 ENCOUNTER — Ambulatory Visit: Payer: Medicaid Other

## 2018-12-13 ENCOUNTER — Telehealth: Payer: Self-pay | Admitting: Lactation Services

## 2018-12-13 NOTE — Telephone Encounter (Signed)
Called pt to have her come in today at 3 pm to have urine checked. Pt voiced understanding and denies signs/sx of Covid 19. Informed pt she needs to wear a mask during visit. Pt voiced understanding.

## 2018-12-14 ENCOUNTER — Ambulatory Visit: Payer: Medicaid Other

## 2018-12-22 ENCOUNTER — Encounter: Payer: Medicaid Other | Admitting: Nurse Practitioner

## 2019-01-04 ENCOUNTER — Inpatient Hospital Stay (HOSPITAL_COMMUNITY)
Admission: AD | Admit: 2019-01-04 | Discharge: 2019-01-04 | Disposition: A | Discharge status: Home / Self Care | Payer: Medicaid Other | Attending: Family Medicine | Admitting: Family Medicine

## 2019-01-04 ENCOUNTER — Other Ambulatory Visit: Payer: Self-pay

## 2019-01-04 ENCOUNTER — Telehealth: Payer: Self-pay | Admitting: Obstetrics & Gynecology

## 2019-01-04 DIAGNOSIS — N898 Other specified noninflammatory disorders of vagina: Secondary | ICD-10-CM | POA: Diagnosis not present

## 2019-01-04 DIAGNOSIS — R109 Unspecified abdominal pain: Secondary | ICD-10-CM | POA: Diagnosis not present

## 2019-01-04 LAB — WET PREP, GENITAL
Sperm: NONE SEEN
Trich, Wet Prep: NONE SEEN
Yeast Wet Prep HPF POC: NONE SEEN

## 2019-01-04 LAB — URINALYSIS, ROUTINE W REFLEX MICROSCOPIC
Bacteria, UA: NONE SEEN
Bilirubin Urine: NEGATIVE
Glucose, UA: NEGATIVE mg/dL
Hgb urine dipstick: NEGATIVE
Ketones, ur: NEGATIVE mg/dL
Nitrite: NEGATIVE
Protein, ur: NEGATIVE mg/dL
Specific Gravity, Urine: 1.025 (ref 1.005–1.030)
pH: 6 (ref 5.0–8.0)

## 2019-01-04 NOTE — MAU Note (Signed)
Pt reports she has white creamy vaginal discharge with odor. She thinks she has BV.  Also having periodic cramping.  A few an hour .

## 2019-01-04 NOTE — Telephone Encounter (Signed)
Called the patient to inform of the upcoming appointment. Informed the patient of wearing a mask, no visitors, or children are allowed due to covid restrictions. The patient also answered no to all of the covid19 screening questions. °

## 2019-01-05 ENCOUNTER — Other Ambulatory Visit (HOSPITAL_COMMUNITY): Payer: Self-pay

## 2019-01-05 ENCOUNTER — Encounter: Payer: Medicaid Other | Admitting: Medical

## 2019-01-05 DIAGNOSIS — B9689 Other specified bacterial agents as the cause of diseases classified elsewhere: Secondary | ICD-10-CM

## 2019-01-05 DIAGNOSIS — N760 Acute vaginitis: Secondary | ICD-10-CM

## 2019-01-05 MED ORDER — METRONIDAZOLE 500 MG PO TABS: 500.0000 mg | ORAL_TABLET | Freq: Two times a day (BID) | ORAL | 0 refills | Status: DC

## 2019-01-06 ENCOUNTER — Inpatient Hospital Stay (HOSPITAL_BASED_OUTPATIENT_CLINIC_OR_DEPARTMENT_OTHER): Payer: Medicaid Other

## 2019-01-06 ENCOUNTER — Inpatient Hospital Stay (HOSPITAL_COMMUNITY)
Admission: AD | Admit: 2019-01-06 | Discharge: 2019-01-06 | Disposition: A | Discharge status: Home / Self Care | Payer: Medicaid Other | Attending: Obstetrics and Gynecology | Admitting: Obstetrics and Gynecology

## 2019-01-06 ENCOUNTER — Encounter (HOSPITAL_COMMUNITY): Payer: Self-pay

## 2019-01-06 ENCOUNTER — Other Ambulatory Visit: Payer: Self-pay

## 2019-01-06 ENCOUNTER — Ambulatory Visit (INDEPENDENT_AMBULATORY_CARE_PROVIDER_SITE_OTHER): Payer: Medicaid Other | Admitting: Obstetrics and Gynecology

## 2019-01-06 VITALS — BP 110/71 | HR 97 | Wt 190.1 lb

## 2019-01-06 DIAGNOSIS — O0990 Supervision of high risk pregnancy, unspecified, unspecified trimester: Secondary | ICD-10-CM | POA: Diagnosis not present

## 2019-01-06 DIAGNOSIS — O26892 Other specified pregnancy related conditions, second trimester: Secondary | ICD-10-CM | POA: Diagnosis not present

## 2019-01-06 DIAGNOSIS — R1084 Generalized abdominal pain: Secondary | ICD-10-CM | POA: Diagnosis not present

## 2019-01-06 DIAGNOSIS — O4702 False labor before 37 completed weeks of gestation, second trimester: Secondary | ICD-10-CM

## 2019-01-06 DIAGNOSIS — O0992 Supervision of high risk pregnancy, unspecified, second trimester: Secondary | ICD-10-CM

## 2019-01-06 DIAGNOSIS — Z3A26 26 weeks gestation of pregnancy: Secondary | ICD-10-CM

## 2019-01-06 DIAGNOSIS — O09212 Supervision of pregnancy with history of pre-term labor, second trimester: Secondary | ICD-10-CM

## 2019-01-06 DIAGNOSIS — O99342 Other mental disorders complicating pregnancy, second trimester: Secondary | ICD-10-CM

## 2019-01-06 DIAGNOSIS — Z23 Encounter for immunization: Secondary | ICD-10-CM

## 2019-01-06 LAB — URINALYSIS, COMPLETE (UACMP) WITH MICROSCOPIC
Bilirubin Urine: NEGATIVE
Glucose, UA: 50 mg/dL — AB
Hgb urine dipstick: NEGATIVE
Ketones, ur: NEGATIVE mg/dL
Nitrite: NEGATIVE
Protein, ur: NEGATIVE mg/dL
Specific Gravity, Urine: 1.019 (ref 1.005–1.030)
pH: 7 (ref 5.0–8.0)

## 2019-01-06 LAB — CERVICOVAGINAL ANCILLARY ONLY
Chlamydia: NEGATIVE
Neisseria Gonorrhea: NEGATIVE

## 2019-01-06 LAB — FETAL FIBRONECTIN: Fetal Fibronectin: NEGATIVE

## 2019-01-06 MED ORDER — BLOOD PRESSURE MONITORING DEVI: 1.0000 | 0 refills | Status: DC

## 2019-01-06 NOTE — Addendum Note (Signed)
Addended by: Bethanne Ginger on: 01/06/2019 10:32 AM   Modules accepted: Orders

## 2019-01-06 NOTE — MAU Note (Signed)
D/w Ardean Larsen CNM- states if patient has not had any uc's during MAU visit this RN does not need to reapply EFM.  This RN asked pt about UC's upon return from u/s. Pt states no uc's during MAU visit.

## 2019-01-06 NOTE — Discharge Instructions (Signed)
Preterm Labor and Birth Information °Pregnancy normally lasts 39-41 weeks. Preterm labor is when labor starts early. It starts before you have been pregnant for 37 whole weeks. °What are the risk factors for preterm labor? °Preterm labor is more likely to occur in women who: °· Have an infection while pregnant. °· Have a cervix that is short. °· Have gone into preterm labor before. °· Have had surgery on their cervix. °· Are younger than age 26. °· Are older than age 35. °· Are African American. °· Are pregnant with two or more babies. °· Take street drugs while pregnant. °· Smoke while pregnant. °· Do not gain enough weight while pregnant. °· Got pregnant right after another pregnancy. °What are the symptoms of preterm labor? °Symptoms of preterm labor include: °· Cramps. The cramps may feel like the cramps some women get during their period. The cramps may happen with watery poop (diarrhea). °· Pain in the belly (abdomen). °· Pain in the lower back. °· Regular contractions or tightening. It may feel like your belly is getting tighter. °· Pressure in the lower belly that seems to get stronger. °· More fluid (discharge) leaking from the vagina. The fluid may be watery or bloody. °· Water breaking. °Why is it important to notice signs of preterm labor? °Babies who are born early may not be fully developed. They have a higher chance for: °· Long-term heart problems. °· Long-term lung problems. °· Trouble controlling body systems, like breathing. °· Bleeding in the brain. °· A condition called cerebral palsy. °· Learning difficulties. °· Death. °These risks are highest for babies who are born before 34 weeks of pregnancy. °How is preterm labor treated? °Treatment depends on: °· How long you were pregnant. °· Your condition. °· The health of your baby. °Treatment may involve: °· Having a stitch (suture) placed in your cervix. When you give birth, your cervix opens so the baby can come out. The stitch keeps the cervix  from opening too soon. °· Staying at the hospital. °· Taking or getting medicines, such as: °? Hormone medicines. °? Medicines to stop contractions. °? Medicines to help the baby’s lungs develop. °? Medicines to prevent your baby from having cerebral palsy. °What should I do if I am in preterm labor? °If you think you are going into labor too soon, call your doctor right away. °How can I prevent preterm labor? °· Do not use any tobacco products. °? Examples of these are cigarettes, chewing tobacco, and e-cigarettes. °? If you need help quitting, ask your doctor. °· Do not use street drugs. °· Do not use any medicines unless you ask your doctor if they are safe for you. °· Talk with your doctor before taking any herbal supplements. °· Make sure you gain enough weight. °· Watch for infection. If you think you might have an infection, get it checked right away. °· If you have gone into preterm labor before, tell your doctor. °This information is not intended to replace advice given to you by your health care provider. Make sure you discuss any questions you have with your health care provider. °Document Released: 07/04/2008 Document Revised: 07/30/2018 Document Reviewed: 08/29/2015 °Elsevier Patient Education © 2020 [employer] ° °

## 2019-01-06 NOTE — Progress Notes (Signed)
Pt states has been having contractions, not sure how far apart.

## 2019-01-06 NOTE — MAU Note (Signed)
Sent from clinic for PTL eval.  "Tara Sanchez is to do [name] and swab for PTL".  Feels contractions every day, not now.  No bleeding or leaking.

## 2019-01-06 NOTE — MAU Provider Note (Signed)
Patient Tara Sanchez is a 28 y.o.  [phone] At [redacted]w[redacted]d here after reporting increased pain and contractions at her [place] visit. She denies LOF, vaginal bleeding, decreased fetal movements. The patient has a history of pre-term delivery x 2, but has declined to take 17P.    She attempted to see a provider in MAU on 9-14 because of her vaginal odor, but she left before being seen. At that time, vaginal cultures were collected, and she  was diagnosed with BV and given Flagyl.  She presented to [place] for her Ob visit today reporting contractions and that she had not been seen on Monday, 9-14 on MAU. As a precautionary measure, she was sent to MAU for evaluation.  History     CSN: [id number]  Arrival date and time: 01/06/19 1026   None     Chief Complaint  Patient presents with  . PTL eval   Abdominal Pain The current episode started 1 to 4 weeks ago. The problem occurs intermittently. The problem has been resolved. The pain is located in the suprapubic region. The pain is at a severity of 3/10. The quality of the pain is cramping. Pertinent negatives include no constipation, diarrhea, dysuria, nausea or vomiting. Nothing aggravates the pain. The pain is relieved by nothing.   Patient states that she has been having these con OB History    Gravida  7   Para  3   Term  1   Preterm  2   AB  3   Living  3     SAB  0   TAB  3   Ectopic  0   Multiple      Live Births  3           Past Medical History:  Diagnosis Date  . Bipolar 2 disorder ([place])   . Genital herpes   . Headache   . History of preterm delivery, currently pregnant 04/17/2016   makena  . Infection    UTI  . Preterm labor     Past Surgical History:  Procedure Laterality Date  . INDUCED ABORTION    . THERAPEUTIC ABORTION      Family History  Problem Relation Age of Onset  . Healthy Mother   . Healthy Father   . Diabetes Paternal Grandmother   . Asthma Neg Hx   . Cancer Neg Hx   . Heart  disease Neg Hx   . Hypertension Neg Hx   . Stroke Neg Hx   . Alcohol abuse Neg Hx   . Arthritis Neg Hx   . Birth defects Neg Hx   . COPD Neg Hx   . Depression Neg Hx   . Drug abuse Neg Hx   . Early death Neg Hx   . Hearing loss Neg Hx   . Hyperlipidemia Neg Hx   . Kidney disease Neg Hx   . Learning disabilities Neg Hx   . Mental illness Neg Hx   . Mental retardation Neg Hx   . Miscarriages / Stillbirths Neg Hx   . Vision loss Neg Hx   . Varicose Veins Neg Hx     Social History   Tobacco Use  . Smoking status: Never Smoker  . Smokeless tobacco: Never Used  Substance Use Topics  . Alcohol use: Not Currently    Frequency: Never    Comment: Social  . Drug use: Not Currently    Types: Marijuana    Comment: Last use 08/01/2018  Allergies:  Allergies  Allergen Reactions  . Macrobid [Nitrofurantoin Macrocrystal] Anaphylaxis  . Phenergan [Promethazine Hcl] Anaphylaxis  . Tessalon [Benzonatate] Anaphylaxis    Medications Prior to Admission  Medication Sig Dispense Refill Last Dose  . metroNIDAZOLE (FLAGYL) 500 MG tablet Take 1 tablet (500 mg total) by mouth 2 (two) times daily. 14 tablet 0   . Prenatal Vit-Fe Fumarate-FA (PREPLUS) 27-1 MG TABS Take 1 tablet by mouth daily. 30 tablet 13     Review of Systems  Constitutional: Negative.   HENT: Negative.   Gastrointestinal: Positive for abdominal pain. Negative for constipation, diarrhea, nausea and vomiting.  Genitourinary: Negative for dysuria.  Neurological: Negative.   Psychiatric/Behavioral: Negative.    Physical Exam   Blood pressure 113/64, pulse 94, temperature 97.8 F (36.6 C), temperature source Oral, resp. rate 16, height 5\' 7"  (1.702 m), weight 86.2 kg, last menstrual period 07/02/2018, SpO2 99 %, unknown if currently breastfeeding.  Physical Exam  Constitutional: She is oriented to person, place, and time. She appears well-developed.  HENT:  Head: Normocephalic.  Neck: Normal range of motion.   Respiratory: Effort normal.  GI: Soft.  Genitourinary:    Vagina normal.     Genitourinary Comments: NEFG: cervix feels soft, 1.5 cm dilated externally but internal os feels closed. No CMT, no suprapubic pressure or pain.    Musculoskeletal: Normal range of motion.  Neurological: She is alert and oriented to person, place, and time.  Skin: Skin is warm and dry.    MAU Course  Procedures  MDM -NST:135 mod var, present acel, neg decels, uterine irritability.  -Patient states that she has not had a contraction since her clinic visit (over an hour ago) -FFN sent as patient's cervix is slightly open, resulted negative.  -Cervical length was 3.3. cm on US today -Wet prep not done as patient had cultures on Monday -UA negative for dehydration or infection.   Assessment and Plan   1. Generalized abdominal pain   2. Supervision of high risk pregnancy, antepartum    2. Patient to keep follow up about appt for lab work on 01-12-2019  3. Return to MAU if she continues to feel more contractions or they do not resolve with rest, hydration. Return to MAU if condition changes or worsens.   Charlesetta GaribaldiKathryn Lorraine Aldrin Engelhard 01/06/2019, 11:57 AM

## 2019-01-06 NOTE — Progress Notes (Signed)
   PRENATAL VISIT NOTE  Subjective:  Tara Sanchez is a 28 y.o. [phone] at [redacted]w[redacted]d being seen today for ongoing prenatal care.  She is currently monitored for the following issues for this high-risk pregnancy and has Maternal varicella, non-immune; Supervision of high risk pregnancy, antepartum; and History of preterm delivery, currently pregnant in second trimester on their problem list.  Patient reports contractions that come and go. Painful at times.  Contractions: Irritability. Vag. Bleeding: None.  Movement: Present. Denies leaking of fluid.   The following portions of the patient's history were reviewed and updated as appropriate: allergies, current medications, past family history, past medical history, past social history, past surgical history and problem list.   Objective:   Vitals:   01/06/19 0937  BP: 110/71  Pulse: 97  Weight: 190 lb 1.8 oz (86.2 kg)    Fetal Status: Fetal Heart Rate (bpm): 136 Fundal Height: 27 cm Movement: Present     General:  Alert, oriented and cooperative. Patient is in no acute distress.  Skin: Skin is warm and dry. No rash noted.   Cardiovascular: Normal heart rate noted  Respiratory: Normal respiratory effort, no problems with respiration noted  Abdomen: Soft, gravid, appropriate for gestational age.  Pain/Pressure: Present     Pelvic: Cervical exam deferred        Extremities: Normal range of motion.  Edema: Trace  Mental Status: Normal mood and affect. Normal behavior. Normal judgment and thought content.   Assessment and Plan:  Pregnancy: [id number] at [redacted]w[redacted]d 1. Supervision of high risk pregnancy, antepartum  Has been having contractions.  She went to the hospital on 9/15 however left AMA because the wait was long.  History of preterm delivery @ 28 weeks. Refuses 17P. Has been having off and on contractions. Should go to MAU for evaluation, ?FFN and cervical check.     Preterm labor symptoms and general obstetric precautions including but  not limited to vaginal bleeding, contractions, leaking of fluid and fetal movement were reviewed in detail with the patient. Please refer to After Visit Summary for other counseling recommendations.   Return in about 4 weeks (around 02/03/2019), or MD only, high risk patient. Needs to have in person visit for 2 hour GTT.  No future appointments.  Noni Saupe, NP

## 2019-01-11 ENCOUNTER — Telehealth: Payer: Self-pay | Admitting: Family Medicine

## 2019-01-11 ENCOUNTER — Other Ambulatory Visit: Payer: Self-pay | Admitting: *Deleted

## 2019-01-11 DIAGNOSIS — O09892 Supervision of other high risk pregnancies, second trimester: Secondary | ICD-10-CM

## 2019-01-11 DIAGNOSIS — O0990 Supervision of high risk pregnancy, unspecified, unspecified trimester: Secondary | ICD-10-CM

## 2019-01-11 NOTE — Telephone Encounter (Signed)
Spoke to patient about her appointment on 9/23 @ 9:10. Patient instructed to wear a face mask for the entire appointment and no visitors are allowed with her during the visit. Patient screened for covid symptoms and denied having any. Patient instructed to come fasting.

## 2019-01-12 ENCOUNTER — Other Ambulatory Visit: Payer: Self-pay

## 2019-01-12 ENCOUNTER — Other Ambulatory Visit: Payer: Medicaid Other

## 2019-01-12 DIAGNOSIS — O0990 Supervision of high risk pregnancy, unspecified, unspecified trimester: Secondary | ICD-10-CM | POA: Diagnosis not present

## 2019-01-12 DIAGNOSIS — O09212 Supervision of pregnancy with history of pre-term labor, second trimester: Secondary | ICD-10-CM | POA: Diagnosis not present

## 2019-01-12 DIAGNOSIS — O09892 Supervision of other high risk pregnancies, second trimester: Secondary | ICD-10-CM

## 2019-01-13 LAB — CBC
Hematocrit: 36.8 % (ref 34.0–46.6)
Hemoglobin: 12.4 g/dL (ref 11.1–15.9)
MCH: 30.9 pg (ref 26.6–33.0)
MCHC: 33.7 g/dL (ref 31.5–35.7)
MCV: 92 fL (ref 79–97)
Platelets: 255 10*3/uL (ref 150–450)
RBC: 4.01 x10E6/uL (ref 3.77–5.28)
RDW: 13.2 % (ref 11.7–15.4)
WBC: 10.3 10*3/uL (ref 3.4–10.8)

## 2019-01-13 LAB — GLUCOSE TOLERANCE, 2 HOURS W/ 1HR
Glucose, 1 hour: 89 mg/dL (ref 65–179)
Glucose, 2 hour: 82 mg/dL (ref 65–152)
Glucose, Fasting: 77 mg/dL (ref 65–91)

## 2019-01-13 LAB — HIV ANTIBODY (ROUTINE TESTING W REFLEX): HIV Screen 4th Generation wRfx: NONREACTIVE

## 2019-01-13 LAB — RPR: RPR Ser Ql: NONREACTIVE

## 2019-01-14 DIAGNOSIS — O0990 Supervision of high risk pregnancy, unspecified, unspecified trimester: Secondary | ICD-10-CM | POA: Diagnosis not present

## 2019-01-20 ENCOUNTER — Telehealth (INDEPENDENT_AMBULATORY_CARE_PROVIDER_SITE_OTHER): Payer: Medicaid Other | Admitting: General Practice

## 2019-01-20 ENCOUNTER — Telehealth: Payer: Self-pay | Admitting: Family Medicine

## 2019-01-20 DIAGNOSIS — O09892 Supervision of other high risk pregnancies, second trimester: Secondary | ICD-10-CM

## 2019-01-20 NOTE — Telephone Encounter (Signed)
Patient called to say she was feeling pressure, and wanted to come in to be checked. Call was transferred to [facility]. She also stated she did not want to see a female provider, and needed her time time changed to the morning.

## 2019-01-20 NOTE — Telephone Encounter (Signed)
Patient called into front office stating she wants an appt for her uterus to be evaluated. Asked patient if she has been having contractions or pelvic pressure. Patient reports contractions all night long for the past 4 days with a lot of pelvic pressure. She states she doesn't think she is in labor because she knows what that is but she thinks she may be dilating and opening up especially given her history. Discussed with Noni Saupe who advises patient go to MAU for evaluation. Discussed with patient and she states yeah that's always what is recommended. Discussed MAU is able to do more testing than we are able to do in the office and it really is the best thing for her given her history. Patient verbalized understanding & hung up the phone.

## 2019-01-21 ENCOUNTER — Encounter (HOSPITAL_COMMUNITY): Payer: Self-pay

## 2019-01-21 ENCOUNTER — Inpatient Hospital Stay (HOSPITAL_COMMUNITY)
Admission: AD | Admit: 2019-01-21 | Discharge: 2019-01-21 | Disposition: A | Discharge status: Home / Self Care | Payer: Medicaid Other | Attending: Obstetrics & Gynecology | Admitting: Obstetrics & Gynecology

## 2019-01-21 ENCOUNTER — Other Ambulatory Visit: Payer: Self-pay

## 2019-01-21 DIAGNOSIS — Z3A29 29 weeks gestation of pregnancy: Secondary | ICD-10-CM | POA: Diagnosis not present

## 2019-01-21 DIAGNOSIS — O9A213 Injury, poisoning and certain other consequences of external causes complicating pregnancy, third trimester: Secondary | ICD-10-CM | POA: Diagnosis not present

## 2019-01-21 DIAGNOSIS — O26893 Other specified pregnancy related conditions, third trimester: Secondary | ICD-10-CM | POA: Diagnosis not present

## 2019-01-21 DIAGNOSIS — M79672 Pain in left foot: Secondary | ICD-10-CM | POA: Insufficient documentation

## 2019-01-21 DIAGNOSIS — Y92038 Other place in apartment as the place of occurrence of the external cause: Secondary | ICD-10-CM | POA: Diagnosis not present

## 2019-01-21 DIAGNOSIS — O0990 Supervision of high risk pregnancy, unspecified, unspecified trimester: Secondary | ICD-10-CM

## 2019-01-21 LAB — URINALYSIS, ROUTINE W REFLEX MICROSCOPIC
Bilirubin Urine: NEGATIVE
Glucose, UA: NEGATIVE mg/dL
Hgb urine dipstick: NEGATIVE
Ketones, ur: 5 mg/dL — AB
Nitrite: NEGATIVE
Protein, ur: NEGATIVE mg/dL
Specific Gravity, Urine: 1.025 (ref 1.005–1.030)
pH: 6 (ref 5.0–8.0)

## 2019-01-21 NOTE — Discharge Instructions (Signed)
You may take Tylenol 1000 mg every 6 hours as needed for pain, apply ice to the affected foot and elevate it on pillows.
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
## 2019-02-07 ENCOUNTER — Other Ambulatory Visit: Payer: Self-pay

## 2019-02-07 ENCOUNTER — Encounter: Payer: Self-pay | Admitting: Family Medicine

## 2019-02-07 ENCOUNTER — Ambulatory Visit (INDEPENDENT_AMBULATORY_CARE_PROVIDER_SITE_OTHER): Payer: Medicaid Other | Admitting: Family Medicine

## 2019-02-07 ENCOUNTER — Encounter: Payer: Medicaid Other | Admitting: Obstetrics and Gynecology

## 2019-02-07 VITALS — BP 113/73 | HR 96 | Wt 191.0 lb

## 2019-02-07 DIAGNOSIS — O0990 Supervision of high risk pregnancy, unspecified, unspecified trimester: Secondary | ICD-10-CM

## 2019-02-07 DIAGNOSIS — O09892 Supervision of other high risk pregnancies, second trimester: Secondary | ICD-10-CM

## 2019-02-07 DIAGNOSIS — O9A213 Injury, poisoning and certain other consequences of external causes complicating pregnancy, third trimester: Secondary | ICD-10-CM

## 2019-02-07 DIAGNOSIS — Z8619 Personal history of other infectious and parasitic diseases: Secondary | ICD-10-CM

## 2019-02-07 DIAGNOSIS — Z2839 Other underimmunization status: Secondary | ICD-10-CM

## 2019-02-07 DIAGNOSIS — O09899 Supervision of other high risk pregnancies, unspecified trimester: Secondary | ICD-10-CM

## 2019-02-07 DIAGNOSIS — Z3A31 31 weeks gestation of pregnancy: Secondary | ICD-10-CM

## 2019-02-07 DIAGNOSIS — O0993 Supervision of high risk pregnancy, unspecified, third trimester: Secondary | ICD-10-CM

## 2019-02-07 DIAGNOSIS — O09893 Supervision of other high risk pregnancies, third trimester: Secondary | ICD-10-CM

## 2019-02-07 MED ORDER — VALACYCLOVIR HCL 500 MG PO TABS: 500.0000 mg | ORAL_TABLET | Freq: Two times a day (BID) | ORAL | 11 refills | Status: DC

## 2019-02-07 NOTE — Patient Instructions (Signed)
Preterm Labor and Birth Information °Pregnancy normally lasts 39-41 weeks. Preterm labor is when labor starts early. It starts before you have been pregnant for 37 whole weeks. °What are the risk factors for preterm labor? °Preterm labor is more likely to occur in women who: °· Have an infection while pregnant. °· Have a cervix that is short. °· Have gone into preterm labor before. °· Have had surgery on their cervix. °· Are younger than age 26. °· Are older than age 35. °· Are African American. °· Are pregnant with two or more babies. °· Take street drugs while pregnant. °· Smoke while pregnant. °· Do not gain enough weight while pregnant. °· Got pregnant right after another pregnancy. °What are the symptoms of preterm labor? °Symptoms of preterm labor include: °· Cramps. The cramps may feel like the cramps some women get during their period. The cramps may happen with watery poop (diarrhea). °· Pain in the belly (abdomen). °· Pain in the lower back. °· Regular contractions or tightening. It may feel like your belly is getting tighter. °· Pressure in the lower belly that seems to get stronger. °· More fluid (discharge) leaking from the vagina. The fluid may be watery or bloody. °· Water breaking. °Why is it important to notice signs of preterm labor? °Babies who are born early may not be fully developed. They have a higher chance for: °· Long-term heart problems. °· Long-term lung problems. °· Trouble controlling body systems, like breathing. °· Bleeding in the brain. °· A condition called cerebral palsy. °· Learning difficulties. °· Death. °These risks are highest for babies who are born before 34 weeks of pregnancy. °How is preterm labor treated? °Treatment depends on: °· How long you were pregnant. °· Your condition. °· The health of your baby. °Treatment may involve: °· Having a stitch (suture) placed in your cervix. When you give birth, your cervix opens so the baby can come out. The stitch keeps the cervix  from opening too soon. °· Staying at the hospital. °· Taking or getting medicines, such as: °? Hormone medicines. °? Medicines to stop contractions. °? Medicines to help the baby’s lungs develop. °? Medicines to prevent your baby from having cerebral palsy. °What should I do if I am in preterm labor? °If you think you are going into labor too soon, call your doctor right away. °How can I prevent preterm labor? °· Do not use any tobacco products. °? Examples of these are cigarettes, chewing tobacco, and e-cigarettes. °? If you need help quitting, ask your doctor. °· Do not use street drugs. °· Do not use any medicines unless you ask your doctor if they are safe for you. °· Talk with your doctor before taking any herbal supplements. °· Make sure you gain enough weight. °· Watch for infection. If you think you might have an infection, get it checked right away. °· If you have gone into preterm labor before, tell your doctor. °This information is not intended to replace advice given to you by your health care provider. Make sure you discuss any questions you have with your health care provider. °Document Released: 07/04/2008 Document Revised: 07/30/2018 Document Reviewed: 08/29/2015 °Elsevier Patient Education © 2020 [employer] Inc. ° °
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
## 2019-02-21 ENCOUNTER — Ambulatory Visit (INDEPENDENT_AMBULATORY_CARE_PROVIDER_SITE_OTHER): Payer: Medicaid Other | Admitting: Family Medicine

## 2019-02-21 ENCOUNTER — Other Ambulatory Visit: Payer: Self-pay

## 2019-02-21 ENCOUNTER — Ambulatory Visit: Payer: Medicaid Other

## 2019-02-21 VITALS — BP 111/66 | HR 107 | Wt 196.2 lb

## 2019-02-21 DIAGNOSIS — O09892 Supervision of other high risk pregnancies, second trimester: Secondary | ICD-10-CM

## 2019-02-21 DIAGNOSIS — Z3A33 33 weeks gestation of pregnancy: Secondary | ICD-10-CM

## 2019-02-21 DIAGNOSIS — O0993 Supervision of high risk pregnancy, unspecified, third trimester: Secondary | ICD-10-CM

## 2019-02-21 DIAGNOSIS — O0990 Supervision of high risk pregnancy, unspecified, unspecified trimester: Secondary | ICD-10-CM

## 2019-02-21 DIAGNOSIS — Z8619 Personal history of other infectious and parasitic diseases: Secondary | ICD-10-CM

## 2019-02-21 DIAGNOSIS — O09213 Supervision of pregnancy with history of pre-term labor, third trimester: Secondary | ICD-10-CM
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
## 2019-03-07 ENCOUNTER — Telehealth: Payer: Self-pay | Admitting: Obstetrics and Gynecology

## 2019-03-07 NOTE — Telephone Encounter (Signed)
Attempted to contact patient about her appointment on 11/18 @ 9:15. No answer and unable to leave a message due to the voicemail not being set up.

## 2019-03-09 ENCOUNTER — Ambulatory Visit (INDEPENDENT_AMBULATORY_CARE_PROVIDER_SITE_OTHER): Payer: Medicaid Other | Admitting: Obstetrics and Gynecology

## 2019-03-09 ENCOUNTER — Other Ambulatory Visit: Payer: Self-pay

## 2019-03-09 ENCOUNTER — Encounter: Payer: Self-pay | Admitting: Obstetrics and Gynecology

## 2019-03-09 VITALS — BP 123/63 | HR 79 | Wt 194.6 lb

## 2019-03-09 DIAGNOSIS — O09213 Supervision of pregnancy with history of pre-term labor, third trimester: Secondary | ICD-10-CM | POA: Diagnosis not present

## 2019-03-09 DIAGNOSIS — O0990 Supervision of high risk pregnancy, unspecified, unspecified trimester: Secondary | ICD-10-CM

## 2019-03-09 DIAGNOSIS — Z8619 Personal history of other infectious and parasitic diseases: Secondary | ICD-10-CM | POA: Diagnosis not present

## 2019-03-09 DIAGNOSIS — O09893 Supervision of other high risk pregnancies, third trimester: Secondary | ICD-10-CM | POA: Diagnosis not present

## 2019-03-09 DIAGNOSIS — O368130 Decreased fetal movements, third trimester, not applicable or unspecified: Secondary | ICD-10-CM | POA: Diagnosis not present

## 2019-03-09 DIAGNOSIS — O09892 Supervision of other high risk pregnancies, second trimester: Secondary | ICD-10-CM

## 2019-03-09 DIAGNOSIS — Z3A35 35 weeks gestation of pregnancy: Secondary | ICD-10-CM | POA: Diagnosis not present

## 2019-03-09 MED ORDER — VALACYCLOVIR HCL 500 MG PO TABS: 500.0000 mg | ORAL_TABLET | Freq: Two times a day (BID) | ORAL | 6 refills | Status: DC

## 2019-03-09 MED ORDER — VITAFOL GUMMIES 3.33-0.333-34.8 MG PO CHEW: 2.0000 | CHEWABLE_TABLET | Freq: Every day | ORAL | 6 refills | Status: AC
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
## 2019-03-14 ENCOUNTER — Other Ambulatory Visit: Payer: Self-pay

## 2019-03-14 ENCOUNTER — Ambulatory Visit (INDEPENDENT_AMBULATORY_CARE_PROVIDER_SITE_OTHER): Payer: Medicaid Other | Admitting: Family Medicine

## 2019-03-14 ENCOUNTER — Other Ambulatory Visit (HOSPITAL_COMMUNITY)
Admission: RE | Admit: 2019-03-14 | Discharge: 2019-03-14 | Disposition: A | Discharge status: Home / Self Care | Payer: Medicaid Other | Source: Ambulatory Visit | Attending: Family Medicine | Admitting: Family Medicine

## 2019-03-14 ENCOUNTER — Encounter: Payer: Self-pay | Admitting: Family Medicine

## 2019-03-14 VITALS — BP 128/71 | HR 102 | Wt 193.2 lb

## 2019-03-14 DIAGNOSIS — Z2839 Other underimmunization status: Secondary | ICD-10-CM

## 2019-03-14 DIAGNOSIS — O09893 Supervision of other high risk pregnancies, third trimester: Secondary | ICD-10-CM

## 2019-03-14 DIAGNOSIS — O09899 Supervision of other high risk pregnancies, unspecified trimester: Secondary | ICD-10-CM

## 2019-03-14 DIAGNOSIS — O0990 Supervision of high risk pregnancy, unspecified, unspecified trimester: Secondary | ICD-10-CM

## 2019-03-14 DIAGNOSIS — O0993 Supervision of high risk pregnancy, unspecified, third trimester: Secondary | ICD-10-CM

## 2019-03-14 DIAGNOSIS — Z8619 Personal history of other infectious and parasitic diseases: Secondary | ICD-10-CM

## 2019-03-14 DIAGNOSIS — Z3A36 36 weeks gestation of pregnancy: Secondary | ICD-10-CM

## 2019-03-14 DIAGNOSIS — O09892 Supervision of other high risk pregnancies, second trimester: Secondary | ICD-10-CM

## 2019-03-14 NOTE — Patient Instructions (Signed)

## 2019-03-14 NOTE — Addendum Note (Signed)
Addended by: Shelly Coss on: 03/14/2019 04:06 PM   Modules accepted: Orders
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
## 2019-03-16 ENCOUNTER — Encounter: Payer: Medicaid Other | Admitting: Family Medicine

## 2019-03-16 LAB — GC/CHLAMYDIA PROBE AMP (~~LOC~~) NOT AT ARMC
Chlamydia: NEGATIVE
Comment: NEGATIVE
Comment: NORMAL
Neisseria Gonorrhea: NEGATIVE

## 2019-03-17 ENCOUNTER — Inpatient Hospital Stay (HOSPITAL_COMMUNITY)
Admission: AD | Admit: 2019-03-17 | Discharge: 2019-03-19 | DRG: 806 | Disposition: A | Discharge status: Home / Self Care | Payer: Medicaid Other | Attending: Obstetrics and Gynecology | Admitting: Obstetrics and Gynecology

## 2019-03-17 ENCOUNTER — Encounter: Payer: Self-pay | Admitting: Family Medicine

## 2019-03-17 ENCOUNTER — Inpatient Hospital Stay (HOSPITAL_COMMUNITY): Payer: Medicaid Other | Admitting: Anesthesiology

## 2019-03-17 ENCOUNTER — Other Ambulatory Visit: Payer: Self-pay

## 2019-03-17 ENCOUNTER — Encounter (HOSPITAL_COMMUNITY): Payer: Self-pay

## 2019-03-17 DIAGNOSIS — Z30017 Encounter for initial prescription of implantable subdermal contraceptive: Secondary | ICD-10-CM | POA: Diagnosis not present

## 2019-03-17 DIAGNOSIS — O99824 Streptococcus B carrier state complicating childbirth: Secondary | ICD-10-CM | POA: Diagnosis not present

## 2019-03-17 DIAGNOSIS — E669 Obesity, unspecified: Secondary | ICD-10-CM | POA: Diagnosis present

## 2019-03-17 DIAGNOSIS — O42913 Preterm premature rupture of membranes, unspecified as to length of time between rupture and onset of labor, third trimester: Secondary | ICD-10-CM | POA: Diagnosis present

## 2019-03-17 DIAGNOSIS — Z8759 Personal history of other complications of pregnancy, childbirth and the puerperium: Secondary | ICD-10-CM

## 2019-03-17 DIAGNOSIS — O42919 Preterm premature rupture of membranes, unspecified as to length of time between rupture and onset of labor, unspecified trimester: Secondary | ICD-10-CM

## 2019-03-17 DIAGNOSIS — O9832 Other infections with a predominantly sexual mode of transmission complicating childbirth: Secondary | ICD-10-CM | POA: Diagnosis present

## 2019-03-17 DIAGNOSIS — Z20828 Contact with and (suspected) exposure to other viral communicable diseases: Secondary | ICD-10-CM | POA: Diagnosis present

## 2019-03-17 DIAGNOSIS — A6000 Herpesviral infection of urogenital system, unspecified: Secondary | ICD-10-CM | POA: Diagnosis present

## 2019-03-17 DIAGNOSIS — O9A213 Injury, poisoning and certain other consequences of external causes complicating pregnancy, third trimester: Secondary | ICD-10-CM

## 2019-03-17 DIAGNOSIS — B951 Streptococcus, group B, as the cause of diseases classified elsewhere: Secondary | ICD-10-CM | POA: Diagnosis present

## 2019-03-17 DIAGNOSIS — O42013 Preterm premature rupture of membranes, onset of labor within 24 hours of rupture, third trimester: Secondary | ICD-10-CM | POA: Diagnosis not present

## 2019-03-17 DIAGNOSIS — O0990 Supervision of high risk pregnancy, unspecified, unspecified trimester: Secondary | ICD-10-CM

## 2019-03-17 DIAGNOSIS — Z8751 Personal history of pre-term labor: Secondary | ICD-10-CM

## 2019-03-17 DIAGNOSIS — Z3A36 36 weeks gestation of pregnancy: Secondary | ICD-10-CM

## 2019-03-17 DIAGNOSIS — Z8619 Personal history of other infectious and parasitic diseases: Secondary | ICD-10-CM

## 2019-03-17 DIAGNOSIS — O99214 Obesity complicating childbirth: Secondary | ICD-10-CM | POA: Diagnosis present

## 2019-03-17 LAB — CBC
HCT: 37.1 % (ref 36.0–46.0)
Hemoglobin: 12.1 g/dL (ref 12.0–15.0)
MCH: 29.4 pg (ref 26.0–34.0)
MCHC: 32.6 g/dL (ref 30.0–36.0)
MCV: 90 fL (ref 80.0–100.0)
Platelets: 282 10*3/uL (ref 150–400)
RBC: 4.12 MIL/uL (ref 3.87–5.11)
RDW: 12.5 % (ref 11.5–15.5)
WBC: 8.3 10*3/uL (ref 4.0–10.5)
nRBC: 0 % (ref 0.0–0.2)

## 2019-03-17 LAB — POCT FERN TEST: POCT Fern Test: POSITIVE

## 2019-03-17 LAB — TYPE AND SCREEN
ABO/RH(D): B POS
Antibody Screen: NEGATIVE

## 2019-03-17 LAB — RPR: RPR Ser Ql: NONREACTIVE

## 2019-03-17 LAB — SARS CORONAVIRUS 2 (TAT 6-24 HRS): SARS Coronavirus 2: NEGATIVE

## 2019-03-17 LAB — CULTURE, BETA STREP (GROUP B ONLY): Strep Gp B Culture: POSITIVE — AB

## 2019-03-17 MED ORDER — ONDANSETRON HCL 4 MG/2ML IJ SOLN: 4.0000 mg | INTRAMUSCULAR | Status: DC | PRN

## 2019-03-17 MED ORDER — PHENYLEPHRINE 40 MCG/ML (10ML) SYRINGE FOR IV PUSH (FOR BLOOD PRESSURE SUPPORT): 80.0000 ug | PREFILLED_SYRINGE | INTRAVENOUS | Status: DC | PRN

## 2019-03-17 MED ORDER — ONDANSETRON HCL 4 MG/2ML IJ SOLN: 4.0000 mg | Freq: Four times a day (QID) | INTRAMUSCULAR | Status: DC | PRN

## 2019-03-17 MED ORDER — COCONUT OIL OIL: 1.0000 "application " | TOPICAL_OIL | Status: DC | PRN

## 2019-03-17 MED ORDER — FLEET ENEMA 7-19 GM/118ML RE ENEM: 1.0000 | ENEMA | RECTAL | Status: DC | PRN

## 2019-03-17 MED ORDER — IBUPROFEN 600 MG PO TABS
600.0000 mg | ORAL_TABLET | Freq: Three times a day (TID) | ORAL | Status: DC | PRN
  Administered 2019-03-17 – 2019-03-18 (×3): 600 mg via ORAL
  Filled 2019-03-17 (×4): qty 1

## 2019-03-17 MED ORDER — SODIUM CHLORIDE (PF) 0.9 % IJ SOLN
INTRAMUSCULAR | Status: DC | PRN
  Administered 2019-03-17: 120 mL/h via EPIDURAL

## 2019-03-17 MED ORDER — MISOPROSTOL 50MCG HALF TABLET
50.0000 ug | ORAL_TABLET | ORAL | Status: DC | PRN
  Administered 2019-03-17: 50 ug via BUCCAL
  Filled 2019-03-17: qty 1

## 2019-03-17 MED ORDER — DIBUCAINE (PERIANAL) 1 % EX OINT: 1.0000 "application " | TOPICAL_OINTMENT | CUTANEOUS | Status: DC | PRN

## 2019-03-17 MED ORDER — OXYCODONE-ACETAMINOPHEN 5-325 MG PO TABS: 1.0000 | ORAL_TABLET | ORAL | Status: DC | PRN

## 2019-03-17 MED ORDER — SODIUM CHLORIDE 0.9 % IV SOLN
5.0000 10*6.[IU] | Freq: Once | INTRAVENOUS | Status: AC
  Administered 2019-03-17: 5 10*6.[IU] via INTRAVENOUS
  Filled 2019-03-17: qty 5

## 2019-03-17 MED ORDER — OXYTOCIN 40 UNITS IN NORMAL SALINE INFUSION - SIMPLE MED
2.5000 [IU]/h | INTRAVENOUS | Status: DC
  Administered 2019-03-17: 13:00:00 2.5 [IU]/h via INTRAVENOUS
  Filled 2019-03-17: qty 1000

## 2019-03-17 MED ORDER — OXYTOCIN BOLUS FROM INFUSION
500.0000 mL | Freq: Once | INTRAVENOUS | Status: AC
  Administered 2019-03-17: 500 mL via INTRAVENOUS

## 2019-03-17 MED ORDER — LIDOCAINE HCL (PF) 1 % IJ SOLN: 30.0000 mL | INTRAMUSCULAR | Status: DC | PRN

## 2019-03-17 MED ORDER — FENTANYL-BUPIVACAINE-NACL 0.5-0.125-0.9 MG/250ML-% EP SOLN
12.0000 mL/h | EPIDURAL | Status: DC | PRN
  Filled 2019-03-17: qty 250

## 2019-03-17 MED ORDER — LACTATED RINGERS IV SOLN
500.0000 mL | Freq: Once | INTRAVENOUS | Status: AC
  Administered 2019-03-17: 500 mL via INTRAVENOUS

## 2019-03-17 MED ORDER — OXYCODONE-ACETAMINOPHEN 5-325 MG PO TABS: 2.0000 | ORAL_TABLET | ORAL | Status: DC | PRN

## 2019-03-17 MED ORDER — LIDOCAINE HCL (PF) 1 % IJ SOLN
INTRAMUSCULAR | Status: DC | PRN
  Administered 2019-03-17 (×2): 4 mL via EPIDURAL

## 2019-03-17 MED ORDER — LACTATED RINGERS IV SOLN: 500.0000 mL | INTRAVENOUS | Status: DC | PRN

## 2019-03-17 MED ORDER — TETANUS-DIPHTH-ACELL PERTUSSIS 5-2.5-18.5 LF-MCG/0.5 IM SUSP: 0.5000 mL | Freq: Once | INTRAMUSCULAR | Status: DC

## 2019-03-17 MED ORDER — MEASLES, MUMPS & RUBELLA VAC IJ SOLR: 0.5000 mL | Freq: Once | INTRAMUSCULAR | Status: DC

## 2019-03-17 MED ORDER — DIPHENHYDRAMINE HCL 50 MG/ML IJ SOLN: 12.5000 mg | INTRAMUSCULAR | Status: DC | PRN

## 2019-03-17 MED ORDER — ONDANSETRON HCL 4 MG PO TABS: 4.0000 mg | ORAL_TABLET | ORAL | Status: DC | PRN

## 2019-03-17 MED ORDER — DIPHENHYDRAMINE HCL 25 MG PO CAPS: 25.0000 mg | ORAL_CAPSULE | Freq: Four times a day (QID) | ORAL | Status: DC | PRN

## 2019-03-17 MED ORDER — INFLUENZA VAC SPLIT QUAD 0.5 ML IM SUSY: 0.5000 mL | PREFILLED_SYRINGE | INTRAMUSCULAR | Status: DC

## 2019-03-17 MED ORDER — SIMETHICONE 80 MG PO CHEW: 80.0000 mg | CHEWABLE_TABLET | ORAL | Status: DC | PRN

## 2019-03-17 MED ORDER — EPHEDRINE 5 MG/ML INJ: 10.0000 mg | INTRAVENOUS | Status: DC | PRN

## 2019-03-17 MED ORDER — LACTATED RINGERS IV SOLN
INTRAVENOUS | Status: DC
  Administered 2019-03-17 (×2): via INTRAVENOUS

## 2019-03-17 MED ORDER — WITCH HAZEL-GLYCERIN EX PADS: 1.0000 "application " | MEDICATED_PAD | CUTANEOUS | Status: DC | PRN

## 2019-03-17 MED ORDER — PENICILLIN G POT IN DEXTROSE 60000 UNIT/ML IV SOLN
3.0000 10*6.[IU] | INTRAVENOUS | Status: DC
  Administered 2019-03-17: 3 10*6.[IU] via INTRAVENOUS
  Filled 2019-03-17: qty 50

## 2019-03-17 MED ORDER — ACETAMINOPHEN 325 MG PO TABS: 650.0000 mg | ORAL_TABLET | ORAL | Status: DC | PRN

## 2019-03-17 MED ORDER — ACETAMINOPHEN 325 MG PO TABS
650.0000 mg | ORAL_TABLET | Freq: Four times a day (QID) | ORAL | Status: DC | PRN
  Administered 2019-03-17: 19:00:00 650 mg via ORAL
  Filled 2019-03-17 (×2): qty 2

## 2019-03-17 MED ORDER — TERBUTALINE SULFATE 1 MG/ML IJ SOLN: 0.2500 mg | Freq: Once | INTRAMUSCULAR | Status: DC | PRN

## 2019-03-17 NOTE — Anesthesia Procedure Notes (Signed)
Epidural Patient location during procedure: OB  Staffing Anesthesiologist: Kamayah Pillay, MD Performed: anesthesiologist   Preanesthetic Checklist Completed: patient identified, pre-op evaluation, timeout performed, IV checked, risks and benefits discussed and monitors and equipment checked  Epidural Patient position: sitting Prep: site prepped and draped and DuraPrep Patient monitoring: heart rate, continuous pulse ox and blood pressure Approach: midline Location: L2-L3 Injection technique: LOR air and LOR saline  Needle:  Needle type: Tuohy  Needle gauge: 17 G Needle length: 9 cm Needle insertion depth: 6 cm Catheter type: closed end flexible Catheter size: 19 Gauge Catheter at skin depth: 11 cm Test dose: negative  Assessment Sensory level: T8 Events: blood not aspirated, injection not painful, no injection resistance, negative IV test and no paresthesia  Additional Notes Reason for block:procedure for pain     

## 2019-03-17 NOTE — H&P (Addendum)
LABOR AND DELIVERY ADMISSION HISTORY AND PHYSICAL NOTE  Tara Sanchez is a 28 y.o. female [phone] with IUP at 6367w6d by LMP within 5 days of 1st trimester US. presenting for PPROM. Prior vaginal deliveries. She reports positive fetal movement. She endorses leakage of clear fluid from about 0230 this morning, no vaginal bleeding. Denies any current or past HSV lesions. Support person (mother) unaware of this diagnsosis. Patient has never taken valtrex.  Prenatal History/Complications: PNC at [facility] Pregnancy complications:  - h/o HSV2, on valtrex - h/o preterm delivery - varicella non-immune  Past Medical History: Past Medical History:  Diagnosis Date  . Bipolar 2 disorder (HCC)   . Genital herpes   . Headache   . History of preterm delivery, currently pregnant 04/17/2016   makena  . Infection    UTI  . Preterm labor     Past Surgical History: Past Surgical History:  Procedure Laterality Date  . INDUCED ABORTION    . THERAPEUTIC ABORTION      Obstetrical History: OB History    Gravida  7   Para  3   Term  1   Preterm  2   AB  3   Living  3     SAB  0   TAB  3   Ectopic  0   Multiple      Live Births  3           Social History: Social History   Socioeconomic History  . Marital status: Single    Spouse name: Not on file  . Number of children: 3  . Years of education: Not on file  . Highest education level: Not on file  Occupational History  . Not on file  Social Needs  . Financial resource strain: Not on file  . Food insecurity    Worry: Never true    Inability: Never true  . Transportation needs    Medical: No    Non-medical: No  Tobacco Use  . Smoking status: Never Smoker  . Smokeless tobacco: Never Used  Substance and Sexual Activity  . Alcohol use: Not Currently    Frequency: Never    Comment: Social  . Drug use: Not Currently    Types: Marijuana    Comment: Last use 08/01/2018  . Sexual activity: Yes    Birth  control/protection: None  Lifestyle  . Physical activity    Days per week: Not on file    Minutes per session: Not on file  . Stress: Not on file  Relationships  . Social Musicianconnections    Talks on phone: Not on file    Gets together: Not on file    Attends religious service: Not on file    Active member of club or organization: Not on file    Attends meetings of clubs or organizations: Not on file    Relationship status: Not on file  Other Topics Concern  . Not on file  Social History Narrative   ** Merged History Encounter **        Family History: Family History  Problem Relation Age of Onset  . Healthy Mother   . Healthy Father   . Diabetes Paternal Grandmother   . Asthma Neg Hx   . Cancer Neg Hx   . Heart disease Neg Hx   . Hypertension Neg Hx   . Stroke Neg Hx   . Alcohol abuse Neg Hx   . Arthritis Neg Hx   . Birth  defects Neg Hx   . COPD Neg Hx   . Depression Neg Hx   . Drug abuse Neg Hx   . Early death Neg Hx   . Hearing loss Neg Hx   . Hyperlipidemia Neg Hx   . Kidney disease Neg Hx   . Learning disabilities Neg Hx   . Mental illness Neg Hx   . Mental retardation Neg Hx   . Miscarriages / Stillbirths Neg Hx   . Vision loss Neg Hx   . Varicose Veins Neg Hx     Allergies: Allergies  Allergen Reactions  . Macrobid [Nitrofurantoin Macrocrystal] Anaphylaxis  . Phenergan [Promethazine Hcl] Anaphylaxis  . Tessalon [Benzonatate] Anaphylaxis    Medications Prior to Admission  Medication Sig Dispense Refill Last Dose  . Blood Pressure Monitoring DEVI 1 Device by Does not apply route once a week. ICD CODE: O09.90 (Patient not taking: Reported on 02/21/2019) 1 Device 0   . Prenatal Vit-Fe Fumarate-FA (PREPLUS) 27-1 MG TABS Take 1 tablet by mouth daily. (Patient not taking: Reported on 03/14/2019) 30 tablet 13   . Prenatal Vit-Fe Phos-FA-Omega (VITAFOL GUMMIES) 3.33-0.333-34.8 MG CHEW Chew 2 tablets by mouth daily. (Patient not taking: Reported on 03/14/2019) 60  tablet 6   . valACYclovir (VALTREX) 500 MG tablet Take 1 tablet (500 mg total) by mouth 2 (two) times daily. (Patient not taking: Reported on 03/14/2019) 60 tablet 6      Review of Systems  All systems reviewed and negative except as stated in HPI  Physical Exam Blood pressure 112/72, pulse (!) 120, temperature 98.2 F (36.8 C), resp. rate 18, height 5\' 7"  (1.702 m), weight 87.5 kg, last menstrual period 07/02/2018, SpO2 99 %, unknown if currently breastfeeding. General appearance: alert, oriented, NAD Lungs: normal respiratory effort Heart: regular rate Abdomen: soft, non-tender; gravid, FH appropriate for GA Extremities: No calf swelling or tenderness Presentation: cephalic Fetal monitoring: cat I, baseline FHR 145, 15x15 acels, neg decels Uterine activity: mild Dilation: 1.5 Effacement (%): Thick Exam by:: Patricia Pesa, RN  Prenatal labs: ABO, Rh: B/Positive/-- (05/27 1119) Antibody: Negative (05/27 1119) Rubella: 11.10 (05/27 1119) RPR: Non Reactive (09/23 0913)  HBsAg: Negative (05/27 1119)  HIV: Non Reactive (09/23 0913)  GC/Chlamydia: neg/neg GBS: Positive/-- (11/23 1640)  2-hr GTT: normal Genetic screening:  Low risk female Anatomy US: normal  Prenatal Transfer Tool  Maternal Diabetes: No Genetic Screening: Normal Maternal Ultrasounds/Referrals: Normal Fetal Ultrasounds or other Referrals:  None Maternal Substance Abuse:  No Significant Maternal Medications:  None Significant Maternal Lab Results: Group B Strep positive and Other: varicella, non-immune  Results for orders placed or performed during the hospital encounter of 03/17/19 (from the past 24 hour(s))  Fern Test   Collection Time: 03/17/19  4:11 AM  Result Value Ref Range   POCT Fern Test Positive = ruptured amniotic membanes     Patient Active Problem List   Diagnosis Date Noted  . Traumatic injury during pregnancy in third trimester 01/21/2019  . History of preterm delivery, currently pregnant in  second trimester 10/13/2018  . Supervision of high risk pregnancy, antepartum 08/30/2018  . Positive GBS test 04/25/2015  . History of herpes genitalis 02/07/2015  . Maternal varicella, non-immune 01/09/2015    Assessment: OMER MONTER is a 28 y.o. [phone] at [redacted]w[redacted]d here for PPROM and is GBS +. Can have clear diet at this time.   #Labor: expectant management for penicillin administration. #Pain: Planning on epidural. #FWB: Cat I #ID:  GBS+, starting penicillin #MOF:  bottle #MOC:nexplanon #Circ:  Inpatient   Jamelle Rushing, DO PGY-2 FM 03/17/2019, 4:33 AM    OB FELLOW ATTESTATION  I have seen and examined this patient and agree with above documentation in the resident's note.  Zack Seal, MD/[facility] [place] Fellow  03/17/2019

## 2019-03-17 NOTE — MAU Note (Signed)
Covid swab obtained without difficulty and pt tol well. No symptoms 

## 2019-03-17 NOTE — MAU Provider Note (Signed)
Pt informed that the ultrasound is considered a limited OB ultrasound and is not intended to be a complete ultrasound exam.  Patient also informed that the ultrasound is not being completed with the intent of assessing for fetal or placental anomalies or any pelvic abnormalities.  Explained that the purpose of today's ultrasound is to assess for  presentation.  Patient acknowledges the purpose of the exam and the limitations of the study.     Fetal position vertex by bedside US

## 2019-03-17 NOTE — Progress Notes (Signed)
Labor Progress Note Tara Sanchez is a 28 y.o. [phone] at [redacted]w[redacted]d presented for ROM. S: Comfortable. Would like to eat.  O:  BP 105/61   Pulse 82   Temp 98.3 F (36.8 C) (Oral)   Resp 16   Ht 5\' 7"  (1.702 m)   Wt 87.5 kg   LMP 07/02/2018 (Exact Date)   SpO2 99%   BMI 30.23 kg/m  EFM: 130, moderate variability, reactive, pos accels, no decels Ctx: infrequent   CVE: Dilation: 3 Effacement (%): Thick Cervical Position: Posterior Station: -3 Presentation: Vertex(by bedside US) Exam by:: Dr. Marice Potter   A&P: 28 y.o. [id number] [redacted]w[redacted]d here for PROM. #Labor: Progressing well. Almost sufficient GBS ppx; will give oral Cytotec. Vertex by exam and BSUS. Anticipate SVD. #Pain: per patient request #FWB: Cat I, EFW 3200g #GBS positive; PCN since [id number]  Chauncey Mann, MD 8:48 AM

## 2019-03-17 NOTE — Anesthesia Preprocedure Evaluation (Signed)
Anesthesia Evaluation  Patient identified by MRN, date of birth, ID band Patient awake    Reviewed: Allergy & Precautions, NPO status , Patient's Chart, lab work & pertinent test results  History of Anesthesia Complications Negative for: history of anesthetic complications  Airway Mallampati: II  TM Distance: >3 FB Neck ROM: Full    Dental no notable dental hx. (+) Dental Advisory Given   Pulmonary former smoker,    Pulmonary exam normal breath sounds clear to auscultation       Cardiovascular negative cardio ROS Normal cardiovascular exam Rhythm:Regular Rate:Normal     Neuro/Psych  Headaches, negative psych ROS   GI/Hepatic negative GI ROS, Neg liver ROS,   Endo/Other  obesity  Renal/GU negative Renal ROS  negative genitourinary   Musculoskeletal negative musculoskeletal ROS (+)   Abdominal   Peds negative pediatric ROS (+)  Hematology negative hematology ROS (+)   Anesthesia Other Findings   Reproductive/Obstetrics (+) Pregnancy                             Anesthesia Physical  Anesthesia Plan  ASA: II  Anesthesia Plan: Epidural   Post-op Pain Management:    Induction:   PONV Risk Score and Plan:   Airway Management Planned:   Additional Equipment:   Intra-op Plan:   Post-operative Plan:   Informed Consent: I have reviewed the patients History and Physical, chart, labs and discussed the procedure including the risks, benefits and alternatives for the proposed anesthesia with the patient or authorized representative who has indicated his/her understanding and acceptance.     Dental advisory given  Plan Discussed with: CRNA  Anesthesia Plan Comments:         Anesthesia Quick Evaluation

## 2019-03-17 NOTE — Lactation Note (Signed)
This note was copied from a baby's chart. Lactation Consultation Note Baby 99 hrs old. Noted baby has been receiving Gerber formula. Mom choice to pump and bottle feed. Notified RN to change formula to Similac 22 cal. Formula.  Patient Name: Tara Sanchez [id number] Date: 03/17/2019 Reason for consult: Follow-up assessment;Infant < 6lbs   Maternal Data    Feeding Feeding Type: Bottle Fed - Formula Nipple Type: Slow - flow  LATCH Score                   Interventions    Lactation Tools Discussed/Used     Consult Status Consult Status: Follow-up Date: 03/18/19 Follow-up type: In-patient    Theodoro Kalata 03/17/2019, 10:53 PM

## 2019-03-17 NOTE — Lactation Note (Signed)
This note was copied from a baby's chart. Lactation Consultation Note  Patient Name: Tara Sanchez [id number]'Y Date: 03/17/2019 Reason for consult: Initial assessment;Late-preterm 34-36.6wks;Infant < 6lbs  P4 mother whose infant is now 42 hours old.  This is a LPTI at 36+6 weeks weighing < 6 lbs.  Mother did not breast feed any of her other children.  Her feeding preference on admission was breast/bottle.  Baby was swaddled and asleep in the bassinet when I arrived.  Mother has no desire to attempt latching and will only pump and provide colostrum here in the hospital and may pump occasionally at home.  She informed me that she has a lot going on with her other children and her 56 year old has ADHD and it will be too much to pump every few hours.  She will give mostly formula.    Reviewed the [place] with mother.  She has already given Tara Sanchez Start with the green slow flow nipple and stated baby does well with this nipple.  Offered to initiate the DEBP and she agreed.  Pump parts, assembly, disassembly and cleaning reviewed.  Observed mother pumping and the #24 flange size is appropriate at this time.  Colostrum container provided and milk storage times reviewed.  Finger feeding demonstrated.  Mother demonstrated hand expression but was unable to obtain colostrum drops.  Encouraged to continue practicing before/after pumping to help increase supply.    Mom made aware of O/P services, breastfeeding support groups, community resources, and our phone # for post-discharge questions. Mother does not have a DEBP for home use.  She does not have private insurance or participate in the [facility] program.  She is interested in calling [facility] to see if she would be eligible.  She does not seem interested at this time in obtaining a DEBP.  RN in room at the end of my visit and updated.  Mother will call for questions/concerns.   Maternal Data Formula Feeding for Exclusion: Yes Reason for exclusion: Mother's  choice to formula and breast feed on admission Has patient been taught Hand Expression?: Yes Does the patient have breastfeeding experience prior to this delivery?: No  Feeding Feeding Type: Bottle Fed - Formula Nipple Type: Slow - flow  LATCH Score                   Interventions    Lactation Tools Discussed/Used Tools: Pump Breast pump type: Double-Electric Breast Pump WIC Program: No(Plans to apply) Pump Review: Setup, frequency, and cleaning;Milk Storage Initiated by:: Tara Sanchez Date initiated:: 03/17/19   Consult Status Consult Status: Follow-up Date: 03/18/19 Follow-up type: In-patient    Tara Sanchez 03/17/2019, 7:10 PM

## 2019-03-17 NOTE — MAU Note (Signed)
Leaking clear fld since 0248 this am. No pain. No recent sve.

## 2019-03-17 NOTE — Discharge Summary (Signed)
Postpartum Discharge Summary     Patient Name: Tara Sanchez DOB: [birth year] MRN: [id number]  Date of admission: 03/17/2019 Delivering Provider: Chauncey Mann   Date of discharge: 03/19/2019  Admitting diagnosis: 76WKS WATER BROKE Intrauterine pregnancy: [redacted]w[redacted]d    Secondary diagnosis:  Active Problems:   Preterm delivery--hx 31 week delivery   Positive GBS test   Supervision of high risk pregnancy, antepartum   History of herpes genitalis   Preterm premature rupture of membranes (PPROM) with unknown onset of labor   Indication for care in labor or delivery   [redacted] weeks gestation of pregnancy  Additional problems: None     Discharge diagnosis: Preterm Pregnancy Delivered                                                                                                Post partum procedures:Nexplanon insertion  Augmentation: Cytotec  Complications: None  Hospital course:  Induction of Labor With Vaginal Delivery   28y.o. yo [phone] admitted to the hospital 03/17/2019 for induction of labor.  Indication for induction: PROM.  Patient had an uncomplicated labor course as follows: Initial SVE: 3/thick/-3. Patient received one Cytotec. Received Epidural. She then progressed to complete with uncomplicated delivery. Membrane Rupture Time/Date: 2:48 AM ,03/17/2019   Intrapartum Procedures: Episiotomy: None [1]                                         Lacerations:  None [1]  Patient had delivery of a Viable infant.  Information for the patient's newborn:  SmMakendra, Vigeant0[[id number]]Delivery Method: Vag-Spont    03/17/2019  Details of delivery can be found in separate delivery note.  Patient had a routine postpartum course. Patient is discharged home 03/19/19. Delivery time: 12:01 PM    Magnesium Sulfate received: No BMZ received: No Rhophylac:No MMR:No Transfusion:No  Physical exam  Vitals:   03/18/19 0543 03/18/19 1510 03/18/19 2205 03/19/19 0530  BP: 119/68  115/80 (!) 104/56 117/86  Pulse: (!) 45 62 (!) 57 62  Resp: _0 Temp: 97.9 F (36.6 C) 98 F (36.7 C) 98 F (36.7 C) 98.6 F (37 C)  TempSrc: Oral Oral Oral Oral  SpO2:   100% 100%  Weight:      Height:       General: alert, cooperative and no distress Lochia: appropriate Uterine Fundus: firm Incision: NA DVT Evaluation: No evidence of DVT seen on physical exam. No significant calf/ankle edema. Labs: Lab Results  Component Value Date   WBC 8.3 03/17/2019   HGB 12.1 03/17/2019   HCT 37.1 03/17/2019   MCV 90.0 03/17/2019   PLT 282 03/17/2019   CMP Latest Ref Rng & Units 03/19/2018  Glucose 70 - 99 mg/dL 85  BUN 6 - 20 mg/dL 14  Creatinine 0.44 - 1.00 mg/dL 0.59  Sodium 135 - 145 mmol/L 136  Potassium 3.5 - 5.1 mmol/L 3.5  Chloride 98 - 111 mmol/L 105  CO2 22 -  32 mmol/L 24  Calcium 8.9 - 10.3 mg/dL 8.9  Total Protein 6.5 - 8.1 g/dL 6.6  Total Bilirubin 0.3 - 1.2 mg/dL 0.4  Alkaline Phos 38 - 126 U/L 80  AST 15 - 41 U/L 12(L)  ALT 0 - 44 U/L 14    Discharge instruction: per After Visit Summary and "Baby and Me Booklet".  After visit meds:  Allergies as of 03/19/2019      Reactions   Macrobid [nitrofurantoin Macrocrystal] Anaphylaxis   Phenergan [promethazine Hcl] Anaphylaxis   Tessalon [benzonatate] Anaphylaxis      Medication List    STOP taking these medications   valACYclovir 500 MG tablet Commonly known as: VALTREX     TAKE these medications   acetaminophen 325 MG tablet Commonly known as: Tylenol Take 2 tablets (650 mg total) by mouth every 6 (six) hours as needed (for pain scale < 4).   Blood Pressure Monitoring Devi 1 Device by Does not apply route once a week. ICD CODE: O09.90   ibuprofen 600 MG tablet Commonly known as: ADVIL Take 1 tablet (600 mg total) by mouth every 8 (eight) hours as needed for mild pain.   polyethylene glycol powder 17 GM/SCOOP powder Commonly known as: GLYCOLAX/MIRALAX Take 255 g by mouth once for 1 dose.    PrePLUS 27-1 MG Tabs Take 1 tablet by mouth daily.   Vitafol Gummies 3.33-0.333-34.8 MG Chew Chew 2 tablets by mouth daily.       Diet: routine diet  Activity: Advance as tolerated. Pelvic rest for 6 weeks.   Outpatient follow up:4 weeks Follow up Appt: Future Appointments  Date Time Provider [place]  03/24/2019  4:15 PM Anyanwu, Sallyanne Havers, MD [facility]   Follow up Visit:   Please schedule this patient for Postpartum visit in: 4 weeks with the following provider: Any provider Low risk pregnancy complicated by: none Delivery mode:  SVD Anticipated Birth Control:  Nexplanon PP Procedures needed: none  Schedule Integrated [place] visit: no      Newborn Data: Live born female  Birth Weight: 2415 grams APGAR: 9, 9  Newborn Delivery   Birth date/time: 03/17/2019 12:01:00 Delivery type: Vaginal, Spontaneous      Baby Feeding: Bottle Disposition:home with mother   03/19/2019 Clarnce Flock, MD

## 2019-03-18 MED ORDER — ETONOGESTREL 68 MG ~~LOC~~ IMPL
68.0000 mg | DRUG_IMPLANT | Freq: Once | SUBCUTANEOUS | Status: AC
  Administered 2019-03-18: 68 mg via SUBCUTANEOUS
  Filled 2019-03-18: qty 1

## 2019-03-18 MED ORDER — LIDOCAINE HCL 1 % IJ SOLN
0.0000 mL | Freq: Once | INTRAMUSCULAR | Status: AC | PRN
  Administered 2019-03-18: 5 mL via INTRADERMAL
  Filled 2019-03-18: qty 20

## 2019-03-18 NOTE — Progress Notes (Signed)
Pt requests a Doctor's note for her job.  She works full time from home (10hr/day) and is on her laptop while working.  States she needs a note to clear her to work from home.    Advised pt to let [facility] practitioner know this tomorrow morning.

## 2019-03-18 NOTE — Procedures (Signed)
    PROCEDURE NOTE  Tara Sanchez is a 28 y.o. [phone] who is now PPD#1 and desires Nexplanon insertion.  Nexplanon Insertion Procedure Patient identified, informed consent performed, consent signed.   Patient does understand that irregular bleeding is a very common side effect of this medication. Appropriate time out taken.  Patient's left arm was prepped and draped in the usual sterile fashion. The ruler used to measure and mark insertion area.  Patient was prepped with alcohol swab and then injected with 5 ml of 1% lidocaine.  She was prepped with betadine, Nexplanon removed from packaging,  Device confirmed in needle, then inserted full length of needle and withdrawn per handbook instructions. Nexplanon was able to palpated in the patient's arm; patient palpated the insert herself. There was minimal blood loss.  Patient insertion site covered with guaze and a pressure bandage to reduce any bruising.  The patient tolerated the procedure well and was given post procedure instructions.   Barrington Ellison, MD [facility] Family Medicine Fellow, [facility] for [employer], [place]

## 2019-03-18 NOTE — Anesthesia Postprocedure Evaluation (Signed)
Anesthesia Post Note  Patient: Tara Sanchez  Procedure(s) Performed: AN AD HOC LABOR EPIDURAL     Patient location during evaluation: Mother Baby Anesthesia Type: Epidural Level of consciousness: awake and alert and oriented Pain management: satisfactory to patient Vital Signs Assessment: post-procedure vital signs reviewed and stable Respiratory status: spontaneous breathing and nonlabored ventilation Cardiovascular status: stable Postop Assessment: no headache, no backache, no signs of nausea or vomiting, adequate PO intake, patient able to bend at knees and able to ambulate (patient up walking) Anesthetic complications: no    Last Vitals:  Vitals:   03/18/19 0205 03/18/19 0543  BP: 108/62 119/68  Pulse: (!) 47 (!) 45  Resp: 18 18  Temp: 36.5 C 36.6 C  SpO2:      Last Pain:  Vitals:   03/18/19 0543  TempSrc: Oral  PainSc: 0-No pain   Pain Goal:                   Tara Sanchez

## 2019-03-18 NOTE — Lactation Note (Signed)
This note was copied from a baby's chart. Lactation Consultation Note  Patient Name: Tara Sanchez [id number]'W Date: 03/18/2019 Reason for consult: Late-preterm 34-36.6wks;Infant < 6lbs;1st time breastfeeding;Difficult latch;Follow-up assessment  LC in to visit with P4 Mom of LPTI weighing <6 lbs and at 1% weight loss.   Mom shared that she has decided to formula feed and not try breastfeeding.  When asked about pumping, she said she has, but nothing is coming out.  When reassured that this was normal, Mom shook her head and said that she had milk this early with her other children.  Mom then said "breastfeeding isn't for me", but she appreciated all the assistance she got from [facility] yesterday.   Mom aware of support she can receive and encouraged her to ask prn for help.   Consult Status Consult Status: Complete Date: 03/18/19 Follow-up type: Call as needed    Theia, Dezeeuw 03/18/2019, 5:24 PM

## 2019-03-18 NOTE — Progress Notes (Addendum)
POSTPARTUM PROGRESS NOTE  Subjective: Tara Sanchez is a 28 y.o. [id number] s/p Vaginal Delivery at [redacted]w[redacted]d.  She reports she doing well. No acute events overnight. She denies any problems with ambulating, voiding or po intake. Denies nausea or vomiting. She has  passed flatus. Pain is well controlled.  Lochia is appropriate.  Objective: Blood pressure 119/68, pulse (!) 45, temperature 97.9 F (36.6 C), temperature source Oral, resp. rate 18, height 5\' 7"  (1.702 m), weight 87.5 kg, last menstrual period 07/02/2018, SpO2 100 %, unknown if currently breastfeeding.  Physical Exam:  General: alert, cooperative and no distress Chest: no respiratory distress Abdomen: soft, non-tender  Uterine Fundus: firm, appropriately tender Extremities: No calf swelling or tenderness  no edema  Recent Labs    03/17/19 0455  HGB 12.1  HCT 37.1    Assessment/Plan: Tara Sanchez is a 28 y.o. [id number] s/p Vaginal Delivery at [redacted]w[redacted]d for PPROM  Routine Postpartum Care: Doing well, pain well-controlled.  -- Continue routine care, lactation support  -- Contraception: Nexplanon -- Feeding: Bottle  Dispo: Plan for discharge tomorrow.  Carollee Leitz MD [place] for [place] saw and evaluated the patient. I agree with the findings and the plan of care as documented in the resident's note. Nexplanon placed. Outpatient circ. Vitals stable.  Barrington Ellison, MD [facility] Family Medicine Fellow, [facility] for [employer], [place]
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
## 2019-03-18 NOTE — Progress Notes (Signed)
CSW received call back from [place]. CSW was advised that al of MOB's CPS cases have been closed with no barriers to discharge. CSW has updated MOB of this.    Tara Sanchez Javi Bollman, MSW, LCSW [facility] [place] [phone]

## 2019-03-19 MED ORDER — ACETAMINOPHEN 325 MG PO TABS: 650.0000 mg | ORAL_TABLET | Freq: Four times a day (QID) | ORAL | 0 refills | Status: DC | PRN

## 2019-03-19 MED ORDER — POLYETHYLENE GLYCOL 3350 17 GM/SCOOP PO POWD: 1.0000 | Freq: Once | ORAL | 0 refills | Status: AC

## 2019-03-19 MED ORDER — IBUPROFEN 600 MG PO TABS: 600.0000 mg | ORAL_TABLET | Freq: Three times a day (TID) | ORAL | 0 refills | Status: DC | PRN

## 2019-03-19 NOTE — Discharge Instructions (Signed)

## 2019-03-24 ENCOUNTER — Encounter: Payer: Medicaid Other | Admitting: Obstetrics & Gynecology

## 2019-04-18 ENCOUNTER — Encounter: Payer: Self-pay | Admitting: Nurse Practitioner

## 2019-04-18 DIAGNOSIS — Z975 Presence of (intrauterine) contraceptive device: Secondary | ICD-10-CM | POA: Insufficient documentation

## 2019-04-19 ENCOUNTER — Encounter: Payer: Self-pay | Admitting: Nurse Practitioner

## 2019-04-19 ENCOUNTER — Telehealth (INDEPENDENT_AMBULATORY_CARE_PROVIDER_SITE_OTHER): Payer: Medicaid Other | Admitting: Nurse Practitioner

## 2019-04-19 ENCOUNTER — Other Ambulatory Visit: Payer: Self-pay

## 2019-04-19 DIAGNOSIS — Z1389 Encounter for screening for other disorder: Secondary | ICD-10-CM

## 2019-04-19 DIAGNOSIS — Z3046 Encounter for surveillance of implantable subdermal contraceptive: Secondary | ICD-10-CM

## 2019-04-19 NOTE — Progress Notes (Signed)
I connected with Tara Sanchez on 04/19/19 at  9:15 AM EST by: Phone and verified that I am speaking with the correct person using two identifiers.  Patient is located at home and provider is located at [place].     The purpose of this virtual visit is to provide medical care while limiting exposure to the novel coronavirus. I discussed the limitations, risks, security and privacy concerns of performing an evaluation and management service by Earlie Server, NP and the availability of in person appointments. I also discussed with the patient that there may be a patient responsible charge related to this service. By engaging in this virtual visit, you consent to the provision of healthcare.  Additionally, you authorize for your insurance to be billed for the services provided during this visit.  The patient expressed understanding and agreed to proceed.  The following staff members participated in the virtual visit:  Lowanda Foster, CMA and Earlie Server, NP  Post Partum Visit Note Subjective:    Tara Sanchez is a 28 y.o. [phone] female who presents for a postpartum visit. She is 4 weeks postpartum following a svd. I have fully reviewed the prenatal and intrapartum course. The delivery was at 36.6 gestational weeks. Outcome: spontaneous vaginal delivery. Anesthesia: epidural. Postpartum course has been unremarkable. Baby's course has been unremarkable. Baby is feeding by bottle - gerber good start. Bleeding moderate lochia. Bowel function is normal. Bladder function is normal. Patient is not sexually active. Contraception method is Nexplanon. Postpartum depression screening: negative.  Client reports she is having some vaginal bleeding still almost daily and a small amount.  This has not been a problem for her.  The following portions of the patient's history were reviewed and updated as appropriate: allergies, current medications, past family history, past medical history, past social history, past  surgical history and problem list.  Review of Systems Pertinent items noted in HPI and remainder of comprehensive ROS otherwise negative.   Objective:  There were no vitals filed for this visit. Self-Obtained       Assessment:    Normal postpartum exam. Pap smear not done at today's visit. Last pap smear 04-17-16 and results were normal. Next pap due now.   Plan:    1. Contraception: Nexplanon 2.  Follow up in: 4 weeks for Pap smear and flu shot or as needed.  Send MyChart message if you are having problems with vaginal bleeding.  7 minutes of non-face-to-face time spent with the patient   Alric Seton, [place] 04/19/2019 9:20 AM   Earlie Server, RN, MSN, NP-BC Nurse Practitioner, [facility] for [facility], [place] [facility] 04/19/2019 9:33 AM

## 2019-04-28 ENCOUNTER — Telehealth: Payer: Self-pay | Admitting: Family Medicine

## 2019-04-28 ENCOUNTER — Telehealth (INDEPENDENT_AMBULATORY_CARE_PROVIDER_SITE_OTHER): Payer: Medicaid Other

## 2019-04-28 DIAGNOSIS — R300 Dysuria: Secondary | ICD-10-CM

## 2019-04-28 MED ORDER — CEPHALEXIN 500 MG PO CAPS: 500.0000 mg | ORAL_CAPSULE | Freq: Three times a day (TID) | ORAL | 0 refills | Status: DC

## 2019-04-28 MED ORDER — PHENAZOPYRIDINE HCL 100 MG PO TABS: 100.0000 mg | ORAL_TABLET | Freq: Three times a day (TID) | ORAL | 0 refills | Status: AC

## 2019-04-28 MED ORDER — PRENATAL PLUS 27-1 MG PO TABS: 1.0000 | ORAL_TABLET | Freq: Every day | ORAL | 0 refills | Status: DC

## 2019-04-28 NOTE — Telephone Encounter (Signed)
Patient called to say she needs something called in for a UTI.

## 2019-04-28 NOTE — Telephone Encounter (Signed)
Called pt. Pt states she is experiencing pain and burning with urination. Keflex 500 mg TID and Pyridium 100 mg TID, both for 7 days, ordered per Alysia Penna, MD. Pt states pharmacy stated she has no more prenatal vitamin refills; single refill for prenatal vitamins sent to preferred pharmacy.

## 2019-04-28 NOTE — Telephone Encounter (Signed)
Pt's concerns have been addressed. 

## 2019-05-17 ENCOUNTER — Ambulatory Visit: Payer: Medicaid Other | Admitting: Nurse Practitioner

## 2019-05-17 ENCOUNTER — Encounter: Payer: Self-pay | Admitting: Nurse Practitioner

## 2019-08-04 ENCOUNTER — Encounter: Payer: Self-pay | Admitting: *Deleted

## 2019-09-01 ENCOUNTER — Ambulatory Visit: Payer: Medicaid Other | Admitting: Obstetrics & Gynecology

## 2019-09-13 ENCOUNTER — Ambulatory Visit: Payer: Medicaid Other | Admitting: Nurse Practitioner

## 2019-11-04 ENCOUNTER — Other Ambulatory Visit (HOSPITAL_COMMUNITY)
Admission: RE | Admit: 2019-11-04 | Discharge: 2019-11-04 | Disposition: A | Discharge status: Home / Self Care | Payer: Medicaid Other | Source: Ambulatory Visit | Attending: Obstetrics and Gynecology | Admitting: Obstetrics and Gynecology

## 2019-11-04 ENCOUNTER — Ambulatory Visit (INDEPENDENT_AMBULATORY_CARE_PROVIDER_SITE_OTHER): Payer: Medicaid Other | Admitting: Medical

## 2019-11-04 ENCOUNTER — Other Ambulatory Visit: Payer: Self-pay

## 2019-11-04 ENCOUNTER — Encounter: Payer: Self-pay | Admitting: Medical

## 2019-11-04 VITALS — BP 114/74 | HR 90 | Ht 67.0 in | Wt 178.0 lb

## 2019-11-04 DIAGNOSIS — Z3009 Encounter for other general counseling and advice on contraception: Secondary | ICD-10-CM

## 2019-11-04 DIAGNOSIS — Z3046 Encounter for surveillance of implantable subdermal contraceptive: Secondary | ICD-10-CM

## 2019-11-04 DIAGNOSIS — N898 Other specified noninflammatory disorders of vagina: Secondary | ICD-10-CM | POA: Diagnosis not present

## 2019-11-04 MED ORDER — METRONIDAZOLE 500 MG PO TABS: 500.0000 mg | ORAL_TABLET | Freq: Two times a day (BID) | ORAL | 0 refills | Status: DC

## 2019-11-04 NOTE — Progress Notes (Signed)
°  History:  Tara Sanchez is a 29 y.o. [id number] who presents to clinic today with complaint of vaginal odor and history of BV infections. Also would like to discuss birth control options. She has Nexplanon currently which was placed inpatient PP 03/18/2019. She states initially bleeding was random 1-2 days/month, but last month bled most of the month. She would like to discuss options for changing birth control methods. She does not want to get pregnant right now.    The following portions of the patient's history were reviewed and updated as appropriate: allergies, current medications, family history, past medical history, social history, past surgical history and problem list.  Review of Systems:  Review of Systems  Constitutional: Negative for fever and malaise/fatigue.  Gastrointestinal: Negative for abdominal pain.  Genitourinary: Negative for dysuria, frequency and urgency.       Neg - vaginal bleeding, discharge + vaginal odor      Objective:  Physical Exam BP 114/74    Pulse 90    Ht 5\' 7"  (1.702 m)    Wt 178 lb (80.7 kg)    LMP 10/07/2019 (Within Days)    Breastfeeding No    BMI 27.88 kg/m  Physical Exam Vitals and nursing note reviewed. Exam conducted with a chaperone present.  Constitutional:      General: She is not in acute distress.    Appearance: She is well-developed.  HENT:     Head: Normocephalic and atraumatic.  Cardiovascular:     Rate and Rhythm: Normal rate.  Pulmonary:     Effort: Pulmonary effort is normal.  Abdominal:     General: There is no distension.     Palpations: Abdomen is soft.  Genitourinary:    General: Normal vulva.     Comments: Blind swab obtained Skin:    General: Skin is warm and dry.     Findings: No erythema.  Neurological:     Mental Status: She is alert and oriented to person, place, and time.     Assessment & Plan:  1. Vaginal odor - Cervicovaginal ancillary only( [place]) - metroNIDAZOLE (FLAGYL) 500 MG tablet; Take  1 tablet (500 mg total) by mouth 2 (two) times daily.  Dispense: 14 tablet; Refill: 0  2. Birth control counseling - All options discussed for birth control, patient would like to consider further and will return in 1-2 weeks for nexplanon removal and initiate new birth control at that time   Approximately 15 minutes of total time was spent with this patient   10/09/2019 11/04/2019 10:59 AM

## 2019-11-04 NOTE — Patient Instructions (Signed)

## 2019-11-07 LAB — CERVICOVAGINAL ANCILLARY ONLY
Bacterial Vaginitis (gardnerella): POSITIVE — AB
Candida Glabrata: NEGATIVE
Candida Vaginitis: NEGATIVE
Chlamydia: NEGATIVE
Comment: NEGATIVE
Comment: NEGATIVE
Comment: NEGATIVE
Comment: NEGATIVE
Comment: NEGATIVE
Comment: NORMAL
Neisseria Gonorrhea: NEGATIVE
Trichomonas: NEGATIVE

## 2019-11-18 ENCOUNTER — Other Ambulatory Visit: Payer: Self-pay

## 2019-11-18 ENCOUNTER — Ambulatory Visit (INDEPENDENT_AMBULATORY_CARE_PROVIDER_SITE_OTHER): Payer: Medicaid Other | Admitting: Medical

## 2019-11-18 ENCOUNTER — Encounter: Payer: Self-pay | Admitting: Medical

## 2019-11-18 VITALS — BP 121/65 | HR 76 | Ht 68.0 in | Wt 177.0 lb

## 2019-11-18 DIAGNOSIS — Z3046 Encounter for surveillance of implantable subdermal contraceptive: Secondary | ICD-10-CM | POA: Diagnosis not present

## 2019-11-18 NOTE — Progress Notes (Signed)
GYNECOLOGY CLINIC PROCEDURE NOTE  Ms. Tara Sanchez is a 29 y.o. [phone] here for Nexplanon removal due to irregular bleeding. No GYN concerns.  Last pap smear was on 04/17/2016 and was normal. Patient declines pap smear today.   Nexplanon Removal Patient was given informed consent for removal of her Nexplanon.  Appropriate time out taken. Nexplanon site identified.  Area prepped in usual fashon. Three ml of 1% lidocaine was used to anesthetize the area at the distal end of the implant. A small stab incision was made right beside the implant on the distal portion.  The Nexplanon rod was grasped using hemostats and removed without difficulty.  There was minimal blood loss. Removal of the Nexplanon was difficult today requiring a slightly larger incision and the help of Dr. Vergie Living. Steri-strips were applied over the incision.  A pressure bandage was applied to reduce any bruising.  The patient tolerated the procedure well and was given post procedure instructions.  Patient is planning to use condoms for contraception. May consider IUD which was discussed again today. Patient will return when she is prepared to have IUD inserted. Advised to abstain or use condoms with intercourse until IUD is inserted as she is no longer covered for birth control at this time.   Marny Lowenstein, PA-C 11/18/2019 12:15 PM

## 2019-11-18 NOTE — Patient Instructions (Signed)

## 2019-12-16 DIAGNOSIS — Z1152 Encounter for screening for COVID-19: Secondary | ICD-10-CM | POA: Diagnosis not present

## 2019-12-16 DIAGNOSIS — J029 Acute pharyngitis, unspecified: Secondary | ICD-10-CM | POA: Diagnosis not present

## 2019-12-16 DIAGNOSIS — R05 Cough: Secondary | ICD-10-CM | POA: Diagnosis not present

## 2020-01-24 DIAGNOSIS — Z03818 Encounter for observation for suspected exposure to other biological agents ruled out: Secondary | ICD-10-CM | POA: Diagnosis not present

## 2020-04-20 DIAGNOSIS — Z20822 Contact with and (suspected) exposure to covid-19: Secondary | ICD-10-CM | POA: Diagnosis not present

## 2020-04-21 NOTE — L&D Delivery Note (Signed)
OB/GYN Faculty Practice Delivery Note  Tara Sanchez is a 30 y.o. [id number] s/p SVD at [redacted]w[redacted]d. She was admitted for SOL.   ROM: 0h 21m with clear fluid GBS Status: Negative   Delivery Date/Time: 03/31/21 at 0752  Delivery: Called to room and patient was complete and pushing. Bulging bag noted and AROM performed with clear fluid. With the next two contractions, the head delivered LOA. No nuchal cord present. Shoulder and body delivered in usual fashion. Infant with spontaneous cry, placed on mother's abdomen, dried and stimulated. Cord clamped x 2 after 1-minute delay and cut by patient under my direct supervision. Cord blood drawn. Placenta delivered spontaneously with gentle cord traction. Fundus firm with massage and Pitocin. Labia, perineum, vagina, and cervix were inspected, and no lacerations were noted.   Placenta: Intact, 3VC - sent to L&D Complications: None Lacerations: None EBL: 150 cc Analgesia: None  Infant: Viable female  APGARs 8 and 9  2892 g  [name], MD OB/GYN Fellow, Faculty Practice

## 2020-04-23 NOTE — Telephone Encounter (Signed)
No Note needed

## 2020-08-02 IMAGING — CR CHEST - 2 VIEW
2 series · 2 of 2 positions shown · non-contrast
Comparison: Single-view of the chest 01/25/2015.

CLINICAL DATA: Cough and fever for 3 days.

EXAM:
CHEST - 2 VIEW

[w chest pa]
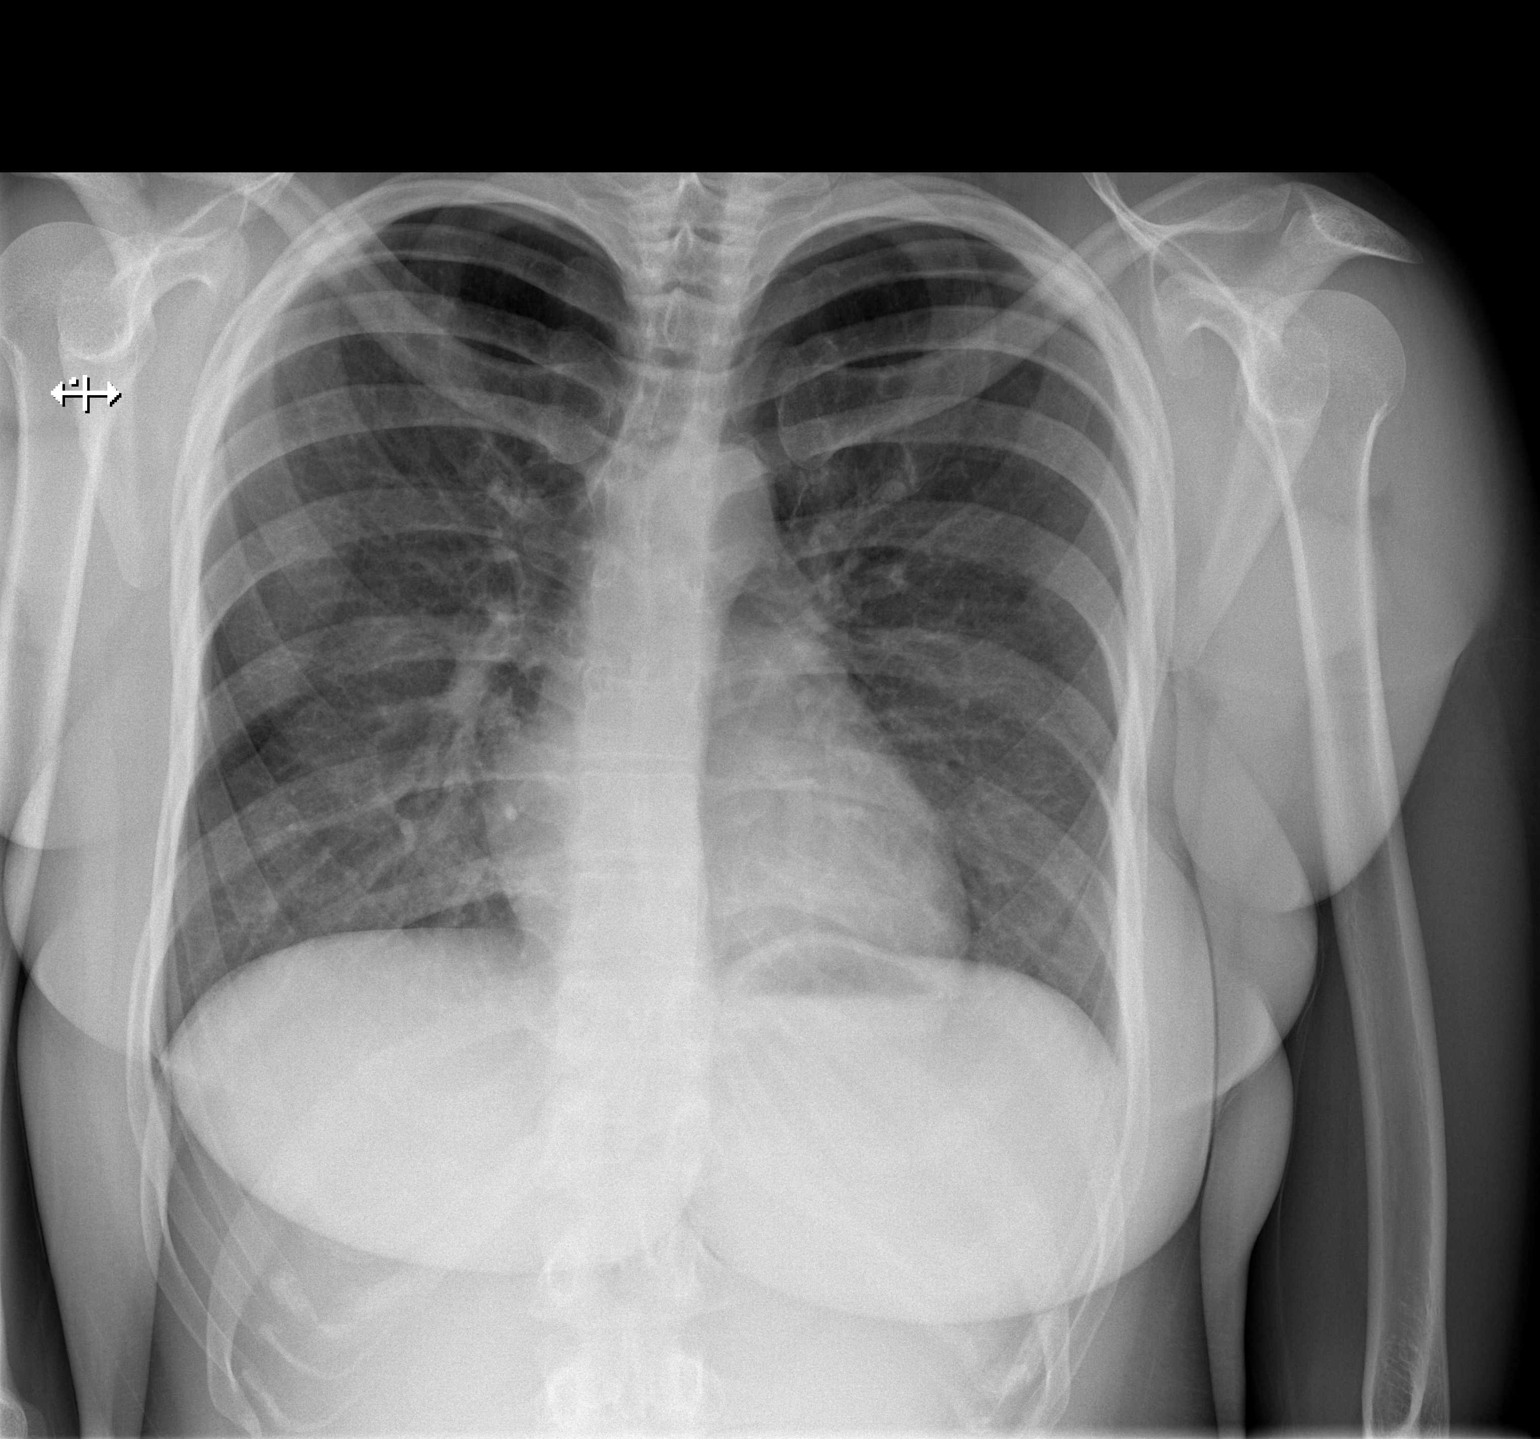

[w chest lat]
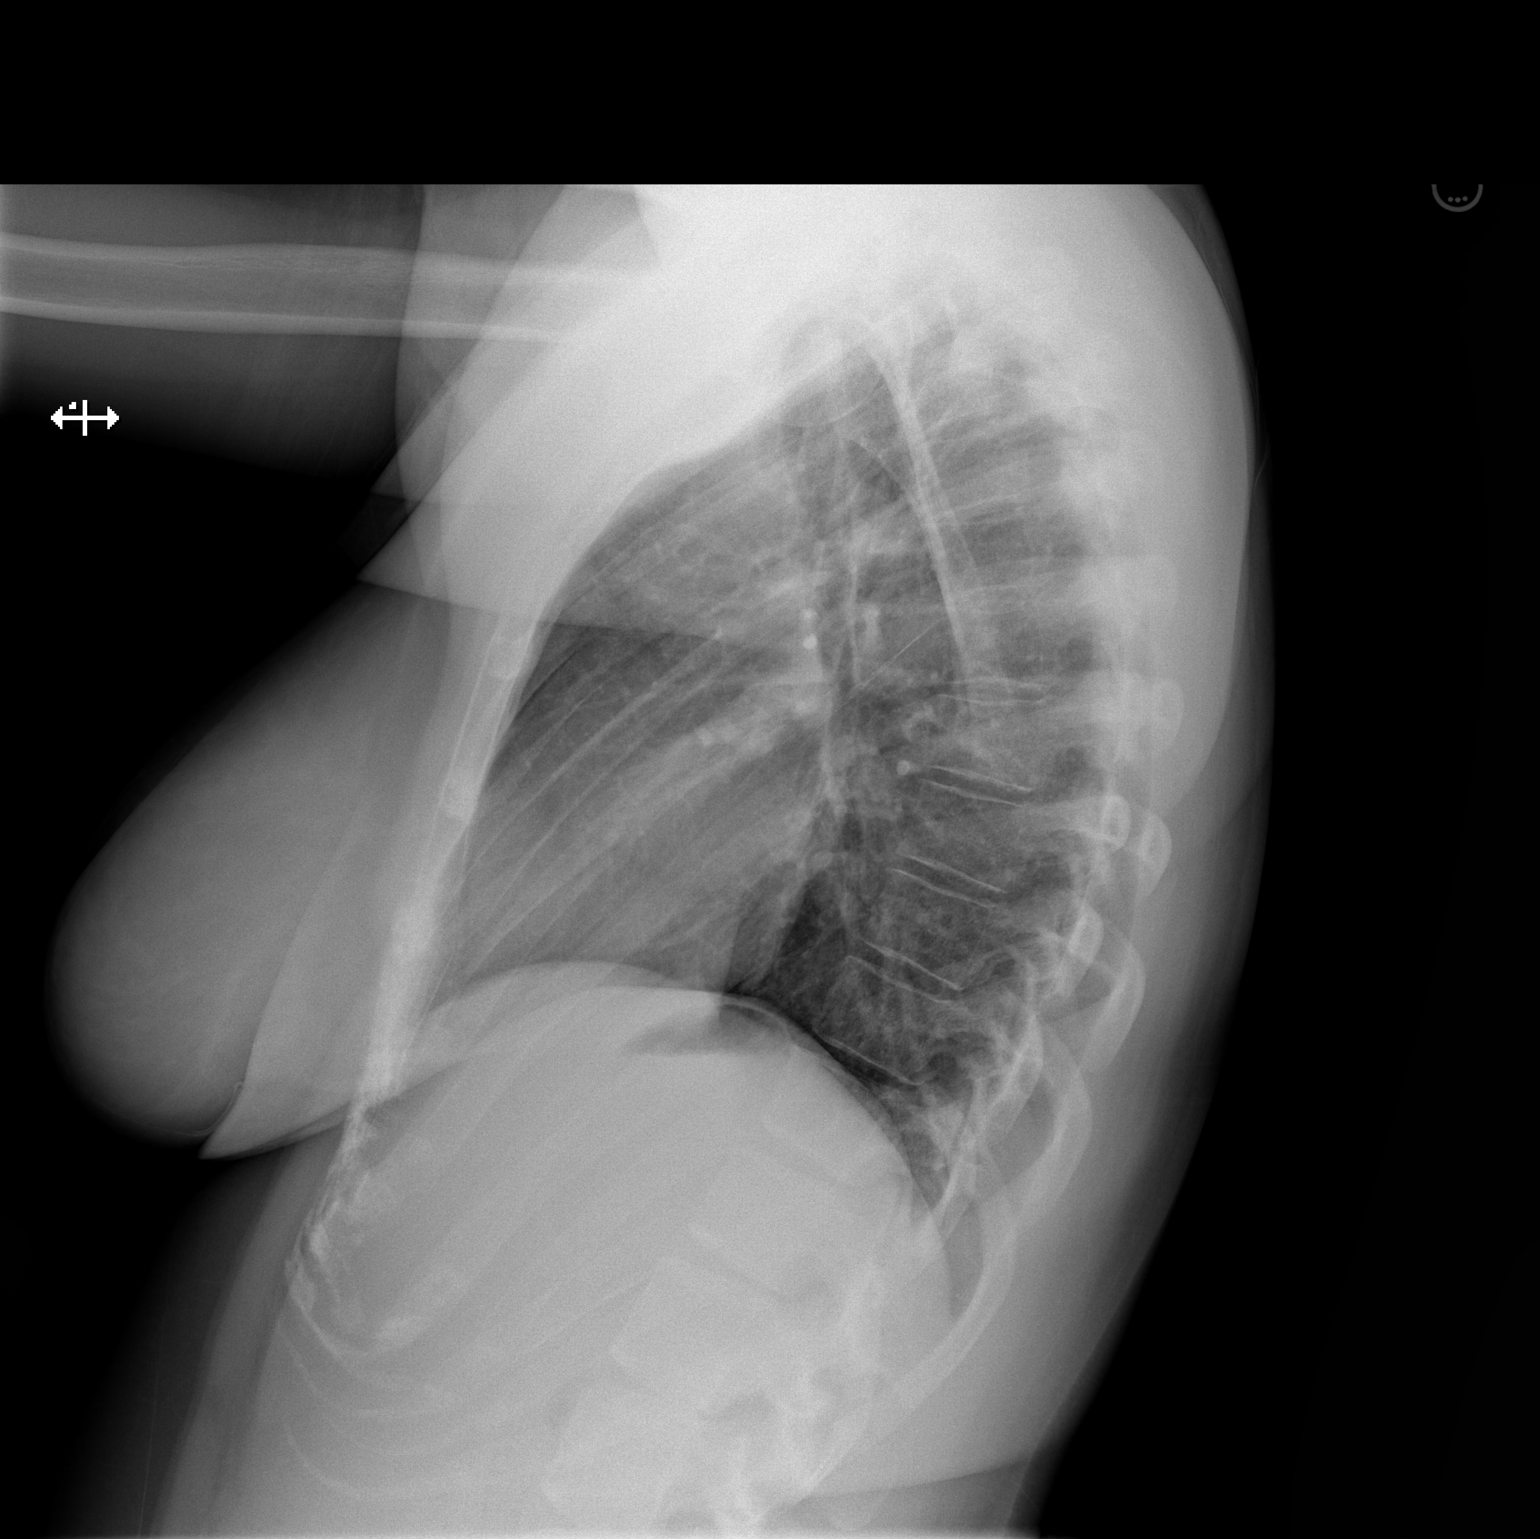

[2 of 2 positions shown; findings below may reference images not displayed]

FINDINGS: The lungs are clear. Heart size is normal. No pneumothorax or
pleural effusion. No acute or focal bony abnormality.
IMPRESSION: No acute disease.

## 2020-08-06 ENCOUNTER — Ambulatory Visit (INDEPENDENT_AMBULATORY_CARE_PROVIDER_SITE_OTHER): Payer: Medicaid Other | Admitting: *Deleted

## 2020-08-06 ENCOUNTER — Encounter: Payer: Self-pay | Admitting: *Deleted

## 2020-08-06 DIAGNOSIS — Z3200 Encounter for pregnancy test, result unknown: Secondary | ICD-10-CM

## 2020-08-06 DIAGNOSIS — Z3201 Encounter for pregnancy test, result positive: Secondary | ICD-10-CM | POA: Diagnosis not present

## 2020-08-06 LAB — POCT PREGNANCY, URINE: Preg Test, Ur: POSITIVE — AB

## 2020-08-06 MED ORDER — PRENATAL VITAMINS 28-0.8 MG PO TABS: 1.0000 | ORAL_TABLET | Freq: Every day | ORAL | 12 refills | Status: DC

## 2020-08-06 NOTE — Progress Notes (Signed)
Pt submitted urine specimen earlier today for UPT which was found to be positive. Pt was called and results information provided. She reports LMP 05/29/20 which yields EDD 03/05/21, now 9w 6d. She denies abnormal bleeding or abdominal pain. Medication reconciliation was completed. Pt was instructed to begin prenatal vitamins and Rx e-prescribed. She was also instructed to go to MAU if she develops heavy vaginal bleeding or abdominal pain. Pt was advised that she will be schedule for prenatal care visits and to check her Mychart in the next Michelle Vanhise or so for the appts. She voiced understanding of all information and instructions given.

## 2020-08-07 NOTE — Progress Notes (Signed)
Patient was assessed and managed by nursing staff during this encounter. I have reviewed the chart and agree with the documentation and plan. I have also made any necessary editorial changes.  Eliud Polo, MD 08/07/2020 9:22 AM   

## 2020-08-08 ENCOUNTER — Other Ambulatory Visit: Payer: Self-pay

## 2020-08-08 ENCOUNTER — Inpatient Hospital Stay (HOSPITAL_COMMUNITY)
Admission: AD | Admit: 2020-08-08 | Discharge: 2020-08-08 | Disposition: A | Discharge status: Home / Self Care | Payer: BC Managed Care – PPO | Attending: Obstetrics & Gynecology | Admitting: Obstetrics & Gynecology

## 2020-08-08 ENCOUNTER — Encounter (HOSPITAL_COMMUNITY): Payer: Self-pay | Admitting: Obstetrics & Gynecology

## 2020-08-08 ENCOUNTER — Inpatient Hospital Stay (HOSPITAL_COMMUNITY): Payer: BC Managed Care – PPO

## 2020-08-08 DIAGNOSIS — Z20822 Contact with and (suspected) exposure to covid-19: Secondary | ICD-10-CM | POA: Diagnosis not present

## 2020-08-08 DIAGNOSIS — M545 Low back pain, unspecified: Secondary | ICD-10-CM | POA: Diagnosis not present

## 2020-08-08 DIAGNOSIS — O26899 Other specified pregnancy related conditions, unspecified trimester: Secondary | ICD-10-CM

## 2020-08-08 DIAGNOSIS — O3680X0 Pregnancy with inconclusive fetal viability, not applicable or unspecified: Secondary | ICD-10-CM | POA: Diagnosis not present

## 2020-08-08 DIAGNOSIS — Z3A00 Weeks of gestation of pregnancy not specified: Secondary | ICD-10-CM | POA: Diagnosis not present

## 2020-08-08 DIAGNOSIS — D72829 Elevated white blood cell count, unspecified: Secondary | ICD-10-CM | POA: Diagnosis not present

## 2020-08-08 DIAGNOSIS — O26891 Other specified pregnancy related conditions, first trimester: Secondary | ICD-10-CM | POA: Insufficient documentation

## 2020-08-08 DIAGNOSIS — Z3A10 10 weeks gestation of pregnancy: Secondary | ICD-10-CM | POA: Insufficient documentation

## 2020-08-08 DIAGNOSIS — R519 Headache, unspecified: Secondary | ICD-10-CM | POA: Diagnosis present

## 2020-08-08 DIAGNOSIS — R109 Unspecified abdominal pain: Secondary | ICD-10-CM

## 2020-08-08 LAB — URINALYSIS, ROUTINE W REFLEX MICROSCOPIC
Bilirubin Urine: NEGATIVE
Glucose, UA: NEGATIVE mg/dL
Hgb urine dipstick: NEGATIVE
Ketones, ur: 20 mg/dL — AB
Nitrite: NEGATIVE
Protein, ur: NEGATIVE mg/dL
Specific Gravity, Urine: 1.021 (ref 1.005–1.030)
pH: 6 (ref 5.0–8.0)

## 2020-08-08 LAB — WET PREP, GENITAL
Sperm: NONE SEEN
Trich, Wet Prep: NONE SEEN
Yeast Wet Prep HPF POC: NONE SEEN

## 2020-08-08 LAB — COMPREHENSIVE METABOLIC PANEL
ALT: 17 U/L (ref 0–44)
AST: 14 U/L — ABNORMAL LOW (ref 15–41)
Albumin: 3.4 g/dL — ABNORMAL LOW (ref 3.5–5.0)
Alkaline Phosphatase: 100 U/L (ref 38–126)
Anion gap: 8 (ref 5–15)
BUN: 6 mg/dL (ref 6–20)
CO2: 25 mmol/L (ref 22–32)
Calcium: 8.4 mg/dL — ABNORMAL LOW (ref 8.9–10.3)
Chloride: 99 mmol/L (ref 98–111)
Creatinine, Ser: 0.7 mg/dL (ref 0.44–1.00)
GFR, Estimated: >60 mL/min (ref >60)
Glucose, Bld: 85 mg/dL (ref 70–99)
Potassium: 3.2 mmol/L — ABNORMAL LOW (ref 3.5–5.1)
Sodium: 132 mmol/L — ABNORMAL LOW (ref 135–145)
Total Bilirubin: 0.5 mg/dL (ref 0.3–1.2)
Total Protein: 6.7 g/dL (ref 6.5–8.1)

## 2020-08-08 LAB — CBC
HCT: 40.1 % (ref 36.0–46.0)
Hemoglobin: 13.6 g/dL (ref 12.0–15.0)
MCH: 30.9 pg (ref 26.0–34.0)
MCHC: 33.9 g/dL (ref 30.0–36.0)
MCV: 91.1 fL (ref 80.0–100.0)
Platelets: 217 10*3/uL (ref 150–400)
RBC: 4.4 MIL/uL (ref 3.87–5.11)
RDW: 13 % (ref 11.5–15.5)
WBC: 16.4 10*3/uL — ABNORMAL HIGH (ref 4.0–10.5)
nRBC: 0 % (ref 0.0–0.2)

## 2020-08-08 LAB — HCG, QUANTITATIVE, PREGNANCY: hCG, Beta Chain, Quant, S: 4784 m[IU]/mL — ABNORMAL HIGH (ref <5)

## 2020-08-08 MED ORDER — ACETAMINOPHEN 500 MG PO TABS
1000.0000 mg | ORAL_TABLET | Freq: Once | ORAL | Status: AC
  Administered 2020-08-08: 1000 mg via ORAL
  Filled 2020-08-08: qty 2

## 2020-08-08 MED ORDER — MENTHOL 3 MG MT LOZG
1.0000 | LOZENGE | OROMUCOSAL | Status: DC | PRN
  Administered 2020-08-08: 3 mg via ORAL
  Filled 2020-08-08: qty 9

## 2020-08-08 NOTE — Discharge Instructions (Signed)
Human Chorionic Gonadotropin Test Why am I having this test? A human chorionic gonadotropin (hCG) test is done to determine whether you are pregnant. It can also be used:  To diagnose an abnormal pregnancy.  To determine whether you have had a miscarriage or are at risk of one. What is being tested? This test checks the level of the human chorionic gonadotropin (hCG) hormone in the blood. This hormone is produced during pregnancy by the cells that form the placenta. The placenta is the organ that grows inside your uterus to nourish a developing baby. When you are pregnant, hCG can be detected in your blood or urine 7 to 8 days before your missed period. The amount of hCG continues to increase for the first 8-10 weeks of pregnancy. The presence of hCG in your blood can be measured with different types of tests. You may have:  A urine test. ? A urine test only shows whether there is hCG in your urine. It does not measure how much.  A qualitative blood test. ? This blood test only shows whether there is hCG in your blood. It does not measure how much.  A quantitative blood test. ? This type of blood test measures the amount of hCG in your blood. ? You may have this test to:  Diagnose an abnormal pregnancy.  Check whether you have had a miscarriage.  Determine whether you are at risk of a miscarriage.  Determine if treatment of an ectopic pregnancy is successful. What kind of sample is taken? Two kinds of samples may be collected to test for the hCG hormone.  Blood. It is usually collected by inserting a needle into a blood vessel.  Urine. It is usually collected by urinating into a germ-free (sterile) specimen cup.      How do I prepare for this test? No preparation is needed for a blood test.  Some preparation is needed for a urine test:  For best results, collect the sample the first time you urinate in the morning. That is when the concentration of hCG is highest.  Do not  drink too much fluid. Drink as you normally would, or as directed by your health care provider. Tell a health care provider about:  All medicines you are taking, including vitamins, herbs, eye drops, creams, and over-the-counter medicines.  Any blood in your urine. This may interfere with the result. How are the results reported? Depending on the type of test that you have, your test results may be reported as values. Your health care provider will compare your results to normal ranges that were established after testing a large group of people (reference ranges). Reference ranges may vary among labs and hospitals. For this test, common reference ranges that show absence of pregnancy are:  Quantitative hCG blood levels: less than 5 IU/L. Other results will be reported as either positive or negative. For this test, normal results (meaning the absence of pregnancy) are:  Negative for hCG in the urine test.  Negative for hCG in the qualitative blood test. What do the results mean? Urine and qualitative blood test  A negative result could mean: ? That you are not pregnant. ? That the test was done too early in your pregnancy to detect hCG in your blood or urine. If you still have other signs of pregnancy, the test will be repeated.  A positive result means: ? That you are most likely pregnant. Your health care provider may confirm your pregnancy with an ultrasound of   your uterus, if needed. Quantitative blood test Results of the quantitative hCG blood test will be reported as values. These values will be interpreted by your health care provider along with your medical history and symptoms you are experiencing. Results outside of expected ranges could mean that:  You are pregnant with twins.  You have abnormal growths in your uterus.  You have an ectopic pregnancy.  You may be experiencing a miscarriage. Talk with your health care provider about what your results mean. Questions to ask  your health care provider Ask your health care provider, or the department that is doing the test:  When will my results be ready?  How will I get my results?  What are my treatment options?  What other tests do I need?  What are my next steps? Summary  A human chorionic gonadotropin (hCG) test is done to determine whether you are pregnant.  When you are pregnant, hCG can be detected in your blood or urine 7 to 8 days before your missed period. HCG levels continue to go up for the first 8-10 weeks of pregnancy.  Your hCG level can be measured with different types of tests. You may have a urine test, a qualitative blood test, or a quantitative blood test.  Talk with your health care provider about what your test results mean. This information is not intended to replace advice given to you by your health care provider. Make sure you discuss any questions you have with your health care provider. Document Revised: 01/09/2020 Document Reviewed: 01/09/2020 Elsevier Patient Education  2021 Elsevier Inc.  Safe Medications in Pregnancy   Acne: Benzoyl Peroxide Salicylic Acid  Backache/Headache: Tylenol: 2 regular strength every 4 hours OR              2 Extra strength every 6 hours  Colds/Coughs/Allergies: Benadryl (alcohol free) 25 mg every 6 hours as needed Breath right strips Claritin Cepacol throat lozenges Chloraseptic throat spray Cold-Eeze- up to three times per day Cough drops, alcohol free Flonase (by prescription only) Guaifenesin Mucinex Robitussin DM (plain only, alcohol free) Saline nasal spray/drops Sudafed (pseudoephedrine) & Actifed ** use only after [redacted] weeks gestation and if you do not have high blood pressure Tylenol Vicks Vaporub Zinc lozenges Zyrtec   Constipation: Colace Ducolax suppositories Fleet enema Glycerin suppositories Metamucil Milk of magnesia Miralax Senokot Smooth move tea  Diarrhea: Kaopectate Imodium A-D  *NO pepto  Bismol  Hemorrhoids: Anusol Anusol HC Preparation H Tucks  Indigestion: Tums Maalox Mylanta Zantac  Pepcid  Insomnia: Benadryl (alcohol free) 25mg every 6 hours as needed Tylenol PM Unisom, no Gelcaps  Leg Cramps: Tums MagGel  Nausea/Vomiting:  Bonine Dramamine Emetrol Ginger extract Sea bands Meclizine  Nausea medication to take during pregnancy:  Unisom (doxylamine succinate 25 mg tablets) Take one tablet daily at bedtime. If symptoms are not adequately controlled, the dose can be increased to a maximum recommended dose of two tablets daily (1/2 tablet in the morning, 1/2 tablet mid-afternoon and one at bedtime). Vitamin B6 100mg tablets. Take one tablet twice a day (up to 200 mg per day).  Skin Rashes: Aveeno products Benadryl cream or 25mg every 6 hours as needed Calamine Lotion 1% cortisone cream  Yeast infection: Gyne-lotrimin 7 Monistat 7   **If taking multiple medications, please check labels to avoid duplicating the same active ingredients **take medication as directed on the label ** Do not exceed 4000 mg of tylenol in 24 hours **Do not take medications that contain aspirin or   ibuprofen      

## 2020-08-08 NOTE — MAU Note (Deleted)
Sharp pain in lower abd and low back. Started yesterday.  Comes and goes, getting stronger. No bleeding. When asked about leaking- pt unsure if pee or just d/c or what.

## 2020-08-08 NOTE — MAU Provider Note (Signed)
History     CSN: [id number]  Arrival date and time: 08/08/20 1715   Event Date/Time   First Provider Initiated Contact with Patient 08/08/20 2007      Chief Complaint  Patient presents with  . Chills  . Headache  . Sore Throat   HPI Tara Sanchez is a 30 y.o. [id number] at [redacted]w[redacted]d by uncertain LMP who presents to MAU with chief complaints of chills, fever, headache and sore throat. Oral temp at work this morning was 95 degrees and then 99 degrees. She has not taken medication or tried other treatments for this complaint. She is s/o COVID vaccination last year.   Patient endorses suprapubic and low back pain. Pain score is 9/10. She denies aggravating or alleviating factors. She has not taken medication or tried other treatments for this complaint.  Patient also c/o anterior bilateral headache. Denies pain in MAU but states her headaches come and go. She denies aggravating or alleviating factors.  Patient denies SOB, chest pain, dysuria, vaginal bleeding.   OB History    Gravida  8   Para  4   Term  1   Preterm  3   AB  3   Living  4     SAB  0   IAB  3   Ectopic  0   Multiple  0   Live Births  4           Past Medical History:  Diagnosis Date  . Bipolar 2 disorder (HCC)   . Genital herpes   . Headache   . History of preterm delivery, currently pregnant 04/17/2016   makena  . Infection    UTI  . Preterm labor     Past Surgical History:  Procedure Laterality Date  . INDUCED ABORTION    . THERAPEUTIC ABORTION      Family History  Problem Relation Age of Onset  . Healthy Mother   . Healthy Father   . Diabetes Paternal Grandmother   . Asthma Neg Hx   . Cancer Neg Hx   . Heart disease Neg Hx   . Hypertension Neg Hx   . Stroke Neg Hx   . Alcohol abuse Neg Hx   . Arthritis Neg Hx   . Birth defects Neg Hx   . COPD Neg Hx   . Depression Neg Hx   . Drug abuse Neg Hx   . Early death Neg Hx   . Hearing loss Neg Hx   . Hyperlipidemia Neg Hx   .  Kidney disease Neg Hx   . Learning disabilities Neg Hx   . Mental illness Neg Hx   . Mental retardation Neg Hx   . Miscarriages / Stillbirths Neg Hx   . Vision loss Neg Hx   . Varicose Veins Neg Hx     Social History   Tobacco Use  . Smoking status: Never Smoker  . Smokeless tobacco: Never Used  Vaping Use  . Vaping Use: Never used  Substance Use Topics  . Alcohol use: Not Currently    Comment: Social  . Drug use: Not Currently    Types: Marijuana    Comment: Last use 08/01/2018    Allergies:  Allergies  Allergen Reactions  . Macrobid [Nitrofurantoin Macrocrystal] Anaphylaxis  . Phenergan [Promethazine Hcl] Anaphylaxis  . Tessalon [Benzonatate] Anaphylaxis    Medications Prior to Admission  Medication Sig Dispense Refill Last Dose  . Prenatal Vit-Fe Fumarate-FA (PRENATAL VITAMINS) 28-0.8 MG TABS Take 1 tablet  by mouth daily. 30 tablet 12     Review of Systems  Constitutional: Positive for chills, fatigue and fever.  Gastrointestinal: Positive for abdominal pain.  Musculoskeletal: Positive for back pain.  All other systems reviewed and are negative.  Physical Exam   Blood pressure (!) 100/48, pulse 99, temperature 100 F (37.8 C), temperature source Oral, resp. rate 20, last menstrual period 05/29/2020, SpO2 98 %, not currently breastfeeding.  Physical Exam Vitals and nursing note reviewed.  Constitutional:      Appearance: She is well-developed. She is ill-appearing.  Cardiovascular:     Rate and Rhythm: Normal rate and regular rhythm.     Heart sounds: Normal heart sounds.  Pulmonary:     Effort: Pulmonary effort is normal.     Breath sounds: Normal breath sounds.  Abdominal:     General: Bowel sounds are normal.     Palpations: Abdomen is soft.     Tenderness: There is no abdominal tenderness.  Skin:    General: Skin is warm and dry.     Capillary Refill: Capillary refill takes less than 2 seconds.  Neurological:     Mental Status: She is alert.   Psychiatric:        Mood and Affect: Mood normal.        Speech: Speech normal.        Behavior: Behavior normal.     MAU Course  Procedures  Orders Placed This Encounter  Procedures  . Wet prep, genital  . SARS CORONAVIRUS 2 (TAT 6-24 HRS) Nasopharyngeal Nasopharyngeal Swab  . Culture, OB Urine  . US OB LESS THAN 14 WEEKS WITH OB TRANSVAGINAL  . Urinalysis, Routine w reflex microscopic Urine, Clean Catch  . CBC  . Comprehensive metabolic panel  . hCG, quantitative, pregnancy  . Discharge patient   Patient Vitals for the past 24 hrs:  BP Temp Temp src Pulse Resp SpO2  08/08/20 1821 (!) 100/48 100 F (37.8 C) Oral 99 20 98 %   Results for orders placed or performed during the hospital encounter of 08/08/20 (from the past 24 hour(s))  Urinalysis, Routine w reflex microscopic Urine, Clean Catch     Status: Abnormal   Collection Time: 08/08/20  6:13 PM  Result Value Ref Range   Color, Urine YELLOW YELLOW   APPearance HAZY (A) CLEAR   Specific Gravity, Urine 1.021 1.005 - 1.030   pH 6.0 5.0 - 8.0   Glucose, UA NEGATIVE NEGATIVE mg/dL   Hgb urine dipstick NEGATIVE NEGATIVE   Bilirubin Urine NEGATIVE NEGATIVE   Ketones, ur 20 (A) NEGATIVE mg/dL   Protein, ur NEGATIVE NEGATIVE mg/dL   Nitrite NEGATIVE NEGATIVE   Leukocytes,Ua TRACE (A) NEGATIVE   RBC / HPF 0-5 0 - 5 RBC/hpf   WBC, UA 6-10 0 - 5 WBC/hpf   Bacteria, UA FEW (A) NONE SEEN   Squamous Epithelial / LPF 0-5 0 - 5   Mucus PRESENT   CBC     Status: Abnormal   Collection Time: 08/08/20  6:34 PM  Result Value Ref Range   WBC 16.4 (H) 4.0 - 10.5 K/uL   RBC 4.40 3.87 - 5.11 MIL/uL   Hemoglobin 13.6 12.0 - 15.0 g/dL   HCT 14.4 81.8 - 56.3 %   MCV 91.1 80.0 - 100.0 fL   MCH 30.9 26.0 - 34.0 pg   MCHC 33.9 30.0 - 36.0 g/dL   RDW 14.9 70.2 - 63.7 %   Platelets 217 150 - 400 K/uL  nRBC 0.0 0.0 - 0.2 %  Comprehensive metabolic panel     Status: Abnormal   Collection Time: 08/08/20  6:34 PM  Result Value Ref Range    Sodium 132 (L) 135 - 145 mmol/L   Potassium 3.2 (L) 3.5 - 5.1 mmol/L   Chloride 99 98 - 111 mmol/L   CO2 25 22 - 32 mmol/L   Glucose, Bld 85 70 - 99 mg/dL   BUN 6 6 - 20 mg/dL   Creatinine, Ser 1.96 0.44 - 1.00 mg/dL   Calcium 8.4 (L) 8.9 - 10.3 mg/dL   Total Protein 6.7 6.5 - 8.1 g/dL   Albumin 3.4 (L) 3.5 - 5.0 g/dL   AST 14 (L) 15 - 41 U/L   ALT 17 0 - 44 U/L   Alkaline Phosphatase 100 38 - 126 U/L   Total Bilirubin 0.5 0.3 - 1.2 mg/dL   GFR, Estimated >22 >29 mL/min   Anion gap 8 5 - 15  hCG, quantitative, pregnancy     Status: Abnormal   Collection Time: 08/08/20  6:34 PM  Result Value Ref Range   hCG, Beta Chain, Quant, S 4,784 (H) <5 mIU/mL  Wet prep, genital     Status: Abnormal   Collection Time: 08/08/20  6:51 PM  Result Value Ref Range   Yeast Wet Prep HPF POC NONE SEEN NONE SEEN   Trich, Wet Prep NONE SEEN NONE SEEN   Clue Cells Wet Prep HPF POC PRESENT (A) NONE SEEN   WBC, Wet Prep HPF POC MANY (A) NONE SEEN   Sperm NONE SEEN    US OB LESS THAN 14 WEEKS WITH OB TRANSVAGINAL  Result Date: 08/08/2020 CLINICAL DATA:  Abdominal and back pain EXAM: OBSTETRIC <14 WK [name] AND TRANSVAGINAL OB US TECHNIQUE: Both transabdominal and transvaginal ultrasound examinations were performed for complete evaluation of the gestation as well as the maternal uterus, adnexal regions, and pelvic cul-de-sac. Transvaginal technique was performed to assess early pregnancy. COMPARISON:  None. FINDINGS: Intrauterine gestational sac: Probable single intrauterine gestational sac Yolk sac:  Not Visualized. Embryo:  Not Visualized. Cardiac Activity: Not Visualized. MSD: 5 mm   5 w   1 d Subchorionic hemorrhage:  None visualized. Maternal uterus/adnexae: Ovaries are within normal limits. Right ovary measures 4.5 by 2.3 by 2.2 cm and contains probable corpus luteum. Left ovary measures 3.6 x 1.6 x 2.2 cm. Trace free fluid. IMPRESSION: Probable early intrauterine gestational sac, but no yolk sac, fetal  pole, or cardiac activity yet visualized. Recommend follow-up quantitative B-HCG levels and follow-up US in 14 days to confirm and assess viability. This recommendation follows SRU consensus guidelines: Diagnostic Criteria for Nonviable Pregnancy Early in the First Trimester. Malva Limes Med 2013; 798:9211-94. Trace free fluid Electronically Signed   By: Jasmine Pang M.D.   On: 08/08/2020 19:58   Assessment and Plan  --31 y.o. [id number] with PUL --Quant hCG 4,784 --Elevated WBCs --TMAX 100.0 --COVID swab in work --Discharge home in stable condition with ectopic precautions  F/U: --Return to MAU Friday after 6:30 pm or Saturday morning for repeat stat Quant hCG  Calvert Cantor, CNM 08/08/2020, 9:29 PM

## 2020-08-08 NOTE — MAU Note (Signed)
Pt states she's had "cold chills," but feels like she's buring up, along sore throat, headache, since yesterday. Also has abdominal and back pain.

## 2020-08-09 LAB — GC/CHLAMYDIA PROBE AMP (~~LOC~~) NOT AT ARMC
Chlamydia: NEGATIVE
Comment: NEGATIVE
Comment: NORMAL
Neisseria Gonorrhea: NEGATIVE

## 2020-08-09 LAB — SARS CORONAVIRUS 2 (TAT 6-24 HRS): SARS Coronavirus 2: NEGATIVE

## 2020-08-10 LAB — CULTURE, OB URINE

## 2020-08-13 ENCOUNTER — Telehealth: Payer: Self-pay | Admitting: Women's Health

## 2020-08-13 NOTE — Telephone Encounter (Signed)
Opened in error  Kahlea Cobert, Odie Sera, NP  1:07 PM 08/13/2020

## 2020-08-15 ENCOUNTER — Telehealth (INDEPENDENT_AMBULATORY_CARE_PROVIDER_SITE_OTHER): Payer: Medicaid Other

## 2020-08-15 DIAGNOSIS — O0990 Supervision of high risk pregnancy, unspecified, unspecified trimester: Secondary | ICD-10-CM

## 2020-08-15 NOTE — Progress Notes (Signed)
Called pt @ 0815 and left message that I am calling in regards to NEW OB intake virtual appt.  If you could please return call to the office.  I will call back in 15 minutes in which if I do not reach you I will have request that you reschedule.    Called pt @ 0830 and left message that this is our second attempt in trying to reach you that I will have to request that you reschedule your appt.  Please call the office at your earliest convenience.    Leonette Nutting  08/15/20

## 2020-08-15 NOTE — Progress Notes (Signed)
Agree with A & P. 

## 2020-08-16 ENCOUNTER — Telehealth (INDEPENDENT_AMBULATORY_CARE_PROVIDER_SITE_OTHER): Payer: Medicaid Other

## 2020-08-16 ENCOUNTER — Other Ambulatory Visit: Payer: Medicaid Other

## 2020-08-16 ENCOUNTER — Other Ambulatory Visit: Payer: Self-pay

## 2020-08-16 DIAGNOSIS — Z3A00 Weeks of gestation of pregnancy not specified: Secondary | ICD-10-CM

## 2020-08-16 DIAGNOSIS — O3680X0 Pregnancy with inconclusive fetal viability, not applicable or unspecified: Secondary | ICD-10-CM

## 2020-08-16 DIAGNOSIS — O0990 Supervision of high risk pregnancy, unspecified, unspecified trimester: Secondary | ICD-10-CM

## 2020-08-16 DIAGNOSIS — O219 Vomiting of pregnancy, unspecified: Secondary | ICD-10-CM

## 2020-08-16 LAB — BETA HCG QUANT (REF LAB): hCG Quant: 22392 m[IU]/mL

## 2020-08-16 MED ORDER — METOCLOPRAMIDE HCL 10 MG PO TABS: 10.0000 mg | ORAL_TABLET | Freq: Three times a day (TID) | ORAL | 0 refills | Status: DC

## 2020-08-16 NOTE — Progress Notes (Signed)
I connected with  Tara Sanchez on 08/16/20 at 11:15 AM EDT by telephone and verified that I am speaking with the correct person using two identifiers.   I discussed the limitations, risks, security and privacy concerns of performing an evaluation and management service by telephone and the availability of in person appointments. I also discussed with the patient that there may be a patient responsible charge related to this service. The patient expressed understanding and agreed to proceed.   Called pt and pt informed me that she has been vomiting a lot and can't keep anything down.  I informed pt that we can prescribe something for her as she is allergic to Phenergan.  Per Dr. Penne Lash, pt can have Reglan 10 mg po tid.  Per chart review, pt's last U/S was on 08/08/20 that did not confirm viability.  I advised pt that we will need to reschedule this NEW OB intake appt as her pregnancy needs to be verified.  Pt reports that she was not able to go to MAU for her STAT beta for f/u.  Reviewed chart with Dr. Penne Lash who agrees that pt can have a non stat beta today, 08/16/20, and a f/u U/S scheduled in 10-14 days from 08/08/20. U/S scheduled for May 23rd @ 0800 due to radiology availability.  Pt advised to call them to see if there is a cancellation so that she can be seen sooner.  Pt asked if she could have a note stating that she can work from home as she is working two jobs and the second job she can possibly work from home with a work note.  I advised pt that once we can verify that her pregnancy is viable she can have the discussion with the provider and that we will need to reschedule her NEW OB appt as well.  Pt verbalized understanding with no further questions.   Leonette Nutting  08/16/20

## 2020-08-21 ENCOUNTER — Telehealth: Payer: Self-pay

## 2020-08-21 NOTE — Telephone Encounter (Signed)
Pt called requesting a call back.  Called and left message that I am returning her call if she continues to have questions to please give [name] a call back.    Leonette Nutting  08/21/20

## 2020-08-22 ENCOUNTER — Encounter: Payer: Medicaid Other | Admitting: Obstetrics and Gynecology

## 2020-08-27 ENCOUNTER — Telehealth: Payer: Self-pay | Admitting: Obstetrics & Gynecology

## 2020-08-27 ENCOUNTER — Telehealth: Payer: Self-pay | Admitting: Family Medicine

## 2020-08-27 ENCOUNTER — Telehealth: Payer: Self-pay | Admitting: Lactation Services

## 2020-08-27 MED ORDER — DOXYLAMINE-PYRIDOXINE 10-10 MG PO TBEC: 100.0000 mg | DELAYED_RELEASE_TABLET | ORAL | 2 refills | Status: DC

## 2020-08-27 NOTE — Telephone Encounter (Signed)
Called patient and she reports she is feeling alright. She is having some intermittent back pain, it increases with walking. She denies bleeding. She is not able to keep anything down. She has tried Gingerale and saltine crackers and is not able to keep anything down. She reports she is able to void a little.   She tried Reglan for 3 days and it did not work. She is allergic to Phenergan. She has not been this ill previously with her pregnancies.   Patient called last week to try to get [name] moved up and was not able to get it moved up.   She does not have a New OB intake yet until viability of pregnancy determined. She is concerned about being at work and wants a note to work from home.   Spoke with Dr. Vergie Living who recommends prescribing Diclegis for N/V. Orders placed.    Called Radiology Scheduling and requested [name] be moved up. They report it is not able to be moved up as no appointments are available.   Reviewed with patient that if she has bleeding, severe abdominal pain, or continuing to not be able to keep anything down and voids decrease more, she is to go to MAU for evaluation. Patient voiced understanding.

## 2020-08-27 NOTE — Telephone Encounter (Signed)
Called pt to advise to call Central Scheduling at [phone] to resch ULT to an earlier date other than 09-10-20 per Dr Penne Lash.

## 2020-08-27 NOTE — Telephone Encounter (Signed)
snt pt mychart msg, unable to reach pt

## 2020-08-27 NOTE — Telephone Encounter (Signed)
-----   Message from Lesly Dukes, MD sent at 08/27/2020  8:32 AM EDT ----- Pt needs f/u US earlier than the May 24th date given.  Patient can go to any [facility] facility.

## 2020-08-30 ENCOUNTER — Telehealth: Payer: Medicaid Other

## 2020-09-01 ENCOUNTER — Other Ambulatory Visit: Payer: Self-pay

## 2020-09-01 ENCOUNTER — Inpatient Hospital Stay (HOSPITAL_COMMUNITY)
Admission: AD | Admit: 2020-09-01 | Discharge: 2020-09-01 | Disposition: A | Discharge status: Home / Self Care | Payer: BC Managed Care – PPO | Attending: Obstetrics & Gynecology | Admitting: Obstetrics & Gynecology

## 2020-09-01 ENCOUNTER — Inpatient Hospital Stay (HOSPITAL_COMMUNITY): Payer: BC Managed Care – PPO

## 2020-09-01 ENCOUNTER — Encounter (HOSPITAL_COMMUNITY): Payer: Self-pay | Admitting: Obstetrics & Gynecology

## 2020-09-01 DIAGNOSIS — Z3A08 8 weeks gestation of pregnancy: Secondary | ICD-10-CM | POA: Diagnosis not present

## 2020-09-01 DIAGNOSIS — O418X10 Other specified disorders of amniotic fluid and membranes, first trimester, not applicable or unspecified: Secondary | ICD-10-CM

## 2020-09-01 DIAGNOSIS — O209 Hemorrhage in early pregnancy, unspecified: Secondary | ICD-10-CM

## 2020-09-01 DIAGNOSIS — Z3A13 13 weeks gestation of pregnancy: Secondary | ICD-10-CM | POA: Insufficient documentation

## 2020-09-01 DIAGNOSIS — Z79899 Other long term (current) drug therapy: Secondary | ICD-10-CM | POA: Diagnosis not present

## 2020-09-01 DIAGNOSIS — O468X1 Other antepartum hemorrhage, first trimester: Secondary | ICD-10-CM | POA: Diagnosis not present

## 2020-09-01 DIAGNOSIS — O208 Other hemorrhage in early pregnancy: Secondary | ICD-10-CM | POA: Diagnosis not present

## 2020-09-01 DIAGNOSIS — O26851 Spotting complicating pregnancy, first trimester: Secondary | ICD-10-CM

## 2020-09-01 LAB — URINALYSIS, ROUTINE W REFLEX MICROSCOPIC
Bacteria, UA: NONE SEEN
Bilirubin Urine: NEGATIVE
Glucose, UA: NEGATIVE mg/dL
Hgb urine dipstick: NEGATIVE
Ketones, ur: NEGATIVE mg/dL
Nitrite: NEGATIVE
Protein, ur: NEGATIVE mg/dL
Specific Gravity, Urine: 1.024 (ref 1.005–1.030)
pH: 6 (ref 5.0–8.0)

## 2020-09-01 LAB — WET PREP, GENITAL
Clue Cells Wet Prep HPF POC: NONE SEEN
Sperm: NONE SEEN
Trich, Wet Prep: NONE SEEN
Yeast Wet Prep HPF POC: NONE SEEN

## 2020-09-01 LAB — OB RESULTS CONSOLE GC/CHLAMYDIA: Gonorrhea: NEGATIVE

## 2020-09-01 NOTE — Discharge Instructions (Signed)
Return to MAU:  If you have heavier bleeding that soaks through more that 2 pads per hour for an hour or more  If you bleed so much that you feel like you might pass out or you do pass out  If you have significant abdominal pain that is not improved with Tylenol 1000 mg every 6 hours as needed for pain  If you develop a fever > 100.5   

## 2020-09-01 NOTE — MAU Provider Note (Signed)
History     CSN: [id number]  Arrival date and time: 09/01/20 1349   Event Date/Time   First Provider Initiated Contact with Patient 09/01/20 1700      Chief Complaint  Patient presents with  . Abdominal Pain  . Vaginal Bleeding  . Emesis   Ms. Tara Sanchez is a 30 y.o. year old [phone] female at [redacted]w[redacted]d weeks gestation who presents to MAU reporting lower abdominal pain all week. She reports she started spotting last night. She reports her boyfriend's mother told her to drink some water and lay down; which she did. She reports that did not relieve the lower abdominal pain. She reports having SI early this morning. She reports the SI caused the lower abdominal pain to increase. She states, "It only lasted 2-3 minutes, so it was alright. It didn't hurt anything or make the bleeding worse, because it was quick." She has [facility] with [facility]; first appointment is scheduled for 09/11/2020.   OB History    Gravida  8   Para  4   Term  1   Preterm  3   AB  3   Living  4     SAB  0   IAB  3   Ectopic  0   Multiple  0   Live Births  4           Past Medical History:  Diagnosis Date  . Bipolar 2 disorder (HCC)   . Genital herpes   . Headache   . History of preterm delivery, currently pregnant 04/17/2016   makena  . Infection    UTI  . Preterm labor     Past Surgical History:  Procedure Laterality Date  . INDUCED ABORTION    . THERAPEUTIC ABORTION      Family History  Problem Relation Age of Onset  . Healthy Mother   . Healthy Father   . Diabetes Paternal Grandmother   . Asthma Neg Hx   . Cancer Neg Hx   . Heart disease Neg Hx   . Hypertension Neg Hx   . Stroke Neg Hx   . Alcohol abuse Neg Hx   . Arthritis Neg Hx   . Birth defects Neg Hx   . COPD Neg Hx   . Depression Neg Hx   . Drug abuse Neg Hx   . Early death Neg Hx   . Hearing loss Neg Hx   . Hyperlipidemia Neg Hx   . Kidney disease Neg Hx   . Learning disabilities Neg Hx   . Mental  illness Neg Hx   . Mental retardation Neg Hx   . Miscarriages / Stillbirths Neg Hx   . Vision loss Neg Hx   . Varicose Veins Neg Hx     Social History   Tobacco Use  . Smoking status: Never Smoker  . Smokeless tobacco: Never Used  Vaping Use  . Vaping Use: Never used  Substance Use Topics  . Alcohol use: Not Currently    Comment: Social  . Drug use: Not Currently    Types: Marijuana    Comment: Last use 08/01/2018    Allergies:  Allergies  Allergen Reactions  . Macrobid [Nitrofurantoin Macrocrystal] Anaphylaxis  . Phenergan [Promethazine Hcl] Anaphylaxis  . Tessalon [Benzonatate] Anaphylaxis    Medications Prior to Admission  Medication Sig Dispense Refill Last Dose  . Prenatal Vit-Fe Fumarate-FA (PRENATAL VITAMINS) 28-0.8 MG TABS Take 1 tablet by mouth daily. 30 tablet 12 Past Week at  Unknown time  . Doxylamine-Pyridoxine 10-10 MG TBEC Take 100 mg by mouth as directed. 100 mg-Take 2 tablets at Bedtime. May add 1 tablet at breakfast and 1 tablet at lunch if needed. 100 tablet 2   . metoCLOPramide (REGLAN) 10 MG tablet Take 1 tablet (10 mg total) by mouth 3 (three) times daily before meals. 60 tablet 0     Review of Systems  Constitutional: Negative.   HENT: Negative.   Eyes: Negative.   Respiratory: Negative.   Cardiovascular: Negative.   Gastrointestinal: Negative.   Endocrine: Negative.   Genitourinary: Positive for pelvic pain (has occured all week, but worse since SI earlier this AM) and vaginal bleeding (spotting).  Musculoskeletal: Negative.   Skin: Negative.   Allergic/Immunologic: Negative.   Neurological: Negative.   Hematological: Negative.   Psychiatric/Behavioral: Negative.    Physical Exam   Blood pressure (!) 107/48, pulse 70, temperature 98.1 F (36.7 C), temperature source Oral, resp. rate 16, last menstrual period 05/29/2020, SpO2 98 %, not currently breastfeeding.  Physical Exam Vitals and nursing note reviewed. Exam conducted with a  chaperone present.  Constitutional:      Appearance: Normal appearance. She is normal weight.  Pulmonary:     Effort: Pulmonary effort is normal.  Abdominal:     General: Abdomen is flat.     Palpations: Abdomen is soft.  Genitourinary:    Comments: Pelvic exam: External genitalia normal, SE: vaginal walls pink and well rugated, cervix is smooth, pink, no lesions, scant amt of thick, white vaginal d/c, no evidence of recent VB -- WP, GC/CT done, cervix visually closed, Uterus is non-tender, S<D, no CMT or friability, no adnexal tenderness.  Musculoskeletal:        General: Normal range of motion.  Skin:    General: Skin is warm and dry.  Neurological:     Mental Status: She is alert and oriented to person, place, and time.  Psychiatric:        Mood and Affect: Mood normal.        Behavior: Behavior normal.        Thought Content: Thought content normal.        Judgment: Judgment normal.    FHTs by doppler: 166 bpm MAU Course  Procedures  MDM Doppler FHTs d/t suspected GA OB <14 wks U/S with Transvaginal Wet Prep GC/CT -- Results pending   Results for orders placed or performed during the hospital encounter of 09/01/20 (from the past 24 hour(s))  Urinalysis, Routine w reflex microscopic     Status: Abnormal   Collection Time: 09/01/20  4:45 PM  Result Value Ref Range   Color, Urine YELLOW YELLOW   APPearance HAZY (A) CLEAR   Specific Gravity, Urine 1.024 1.005 - 1.030   pH 6.0 5.0 - 8.0   Glucose, UA NEGATIVE NEGATIVE mg/dL   Hgb urine dipstick NEGATIVE NEGATIVE   Bilirubin Urine NEGATIVE NEGATIVE   Ketones, ur NEGATIVE NEGATIVE mg/dL   Protein, ur NEGATIVE NEGATIVE mg/dL   Nitrite NEGATIVE NEGATIVE   Leukocytes,Ua TRACE (A) NEGATIVE   RBC / HPF 0-5 0 - 5 RBC/hpf   WBC, UA 11-20 0 - 5 WBC/hpf   Bacteria, UA NONE SEEN NONE SEEN   Squamous Epithelial / LPF 0-5 0 - 5   Mucus PRESENT   Wet prep, genital     Status: Abnormal   Collection Time: 09/01/20  6:00 PM   Result Value Ref Range   Yeast Wet Prep HPF POC NONE SEEN NONE  SEEN   Trich, Wet Prep NONE SEEN NONE SEEN   Clue Cells Wet Prep HPF POC NONE SEEN NONE SEEN   WBC, Wet Prep HPF POC MODERATE (A) NONE SEEN   Sperm NONE SEEN     US OB Comp Less 14 Wks  Result Date: 09/01/2020 CLINICAL DATA:  Vaginal spotting EXAM: OBSTETRIC <14 WK ULTRASOUND TECHNIQUE: Transabdominal ultrasound was performed for evaluation of the gestation as well as the maternal uterus and adnexal regions. COMPARISON:  August 08, 2020 FINDINGS: Intrauterine gestational sac: Visualized-single Yolk sac:  Visualized Embryo:  Visualized Cardiac Activity: Visualized Heart Rate: 166 bpm CRL:   14 mm   7 w 5 d                  [name] EDC: April 15, 2021 Subchorionic hemorrhage: Subchorionic hemorrhage measures 1.6 x 0.6 cm. Maternal uterus/adnexae: Cervical os is closed. Right ovary measures 2.8 x 1.7 x 1.7 cm. Left ovary measures 2.4 x 2.2 x 1.4 cm. No extrauterine pelvic mass or fluid. IMPRESSION: Single live intrauterine gestation with estimated gestational age of 8-weeks. Subchorionic hemorrhage measuring 1.6 x 0.6 cm. No extrauterine pelvic mass or fluid. Electronically Signed   By: Bretta Bang III M.D.   On: 09/01/2020 19:24    Assessment and Plan  Subchorionic hematoma in first trimester, single or unspecified fetus - Information provided on [facility] and threatened miscarriage - Return to MAU:  If you have heavier bleeding that soaks through more that 2 pads per hour for an hour or more  If you bleed so much that you feel like you might pass out or you do pass out  If you have significant abdominal pain that is not improved with Tylenol 1000 mg every 6 hours as needed for pain  If you develop a fever > 100.5 - Advised to be on pelvic rest until told not to be by an OB provider   [redacted] weeks gestation of pregnancy   - Discharge home - Keep scheduled appt with [facility] on 09/11/2020 - Patient verbalized an understanding of the plan of  care and agrees.   Raelyn Mora, CNM 09/01/2020, 5:01 PM

## 2020-09-01 NOTE — MAU Note (Signed)
Pt reports to mau with c/o lower abd pain and spotting that started yesterday.  Pt states she had sex after the bleeding started because she read that sex would make the pain better.

## 2020-09-03 LAB — GC/CHLAMYDIA PROBE AMP (~~LOC~~) NOT AT ARMC
Chlamydia: NEGATIVE
Comment: NEGATIVE
Comment: NORMAL
Neisseria Gonorrhea: NEGATIVE

## 2020-09-04 ENCOUNTER — Encounter: Payer: Medicaid Other | Admitting: Nurse Practitioner

## 2020-09-10 ENCOUNTER — Ambulatory Visit: Payer: Medicaid Other

## 2020-09-11 ENCOUNTER — Other Ambulatory Visit: Payer: Self-pay

## 2020-09-11 ENCOUNTER — Encounter: Payer: Self-pay | Admitting: Obstetrics and Gynecology

## 2020-09-11 ENCOUNTER — Ambulatory Visit (INDEPENDENT_AMBULATORY_CARE_PROVIDER_SITE_OTHER): Payer: BC Managed Care – PPO | Admitting: Obstetrics and Gynecology

## 2020-09-11 VITALS — BP 107/70 | HR 81 | Wt 179.0 lb

## 2020-09-11 DIAGNOSIS — Z3A09 9 weeks gestation of pregnancy: Secondary | ICD-10-CM

## 2020-09-11 DIAGNOSIS — O468X1 Other antepartum hemorrhage, first trimester: Secondary | ICD-10-CM

## 2020-09-11 DIAGNOSIS — O0990 Supervision of high risk pregnancy, unspecified, unspecified trimester: Secondary | ICD-10-CM | POA: Insufficient documentation

## 2020-09-11 DIAGNOSIS — O418X10 Other specified disorders of amniotic fluid and membranes, first trimester, not applicable or unspecified: Secondary | ICD-10-CM | POA: Diagnosis not present

## 2020-09-11 NOTE — Patient Instructions (Signed)

## 2020-09-11 NOTE — Progress Notes (Signed)
Subjective:    Tara Sanchez is a [id number] [redacted]w[redacted]d being seen today for her first obstetrical visit.  Her obstetrical history is significant for history of preterm labor. Patient does intend to breast feed. Pregnancy history fully reviewed.  Patient reports no complaints.  Different FOB from prior pregnancy. Very excited about pregnancy. Split custody of first child. Baby boy with her 24/7.  History of preterm deliveries. Declined Makena with last delivery. First baby born at [redacted]w[redacted]d.  History of depression. Has had a therapist in the past, not actively seeing her but knows how to reach out to her. Doing well overall at this point. Says she was very depressed postpartum in her last pregnancy. 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    Vitals:   09/11/20 1038  BP: 107/70  Pulse: 81  Weight: 179 lb (81.2 kg)    HISTORY: OB History  Gravida Para Term Preterm AB Living  8 4 1 3 3 4   SAB IAB Ectopic Multiple Live Births  0 3 0 0 4    # Outcome Date GA Lbr Len/2nd Weight Sex Delivery Anes PTL Lv  8 Current           7 Preterm 03/17/19 [redacted]w[redacted]d 03:51 / 00:10 5 lb 5.2 oz (2.415 kg) M Vag-Spont EPI  LIV  6 IAB 2020          5 Term 10/18/16 [redacted]w[redacted]d 02:45 / 00:06 5 lb 6.4 oz (2.449 kg) F Vag-Spont None  LIV  4 Preterm 04/26/15 [redacted]w[redacted]d 07:19 / 00:14 5 lb 6.8 oz (2.46 kg) M Vag-Spont EPI  LIV  3 Preterm 04/20/10 [redacted]w[redacted]d   F Vag-Spont   LIV  2 IAB 2010          1 IAB 2009           Past Medical History:  Diagnosis Date  . Bipolar 2 disorder (HCC)   . Genital herpes   . Headache   . History of preterm delivery, currently pregnant 04/17/2016   makena  . Infection    UTI  . Preterm labor    Past Surgical History:  Procedure Laterality Date  . INDUCED ABORTION    . THERAPEUTIC ABORTION     Family History  Problem Relation Age of Onset  . Healthy Mother   . Healthy Father   . Diabetes Paternal Grandmother   . Asthma Neg Hx   . Cancer Neg Hx   . Heart disease Neg Hx   . Hypertension Neg Hx   .  Stroke Neg Hx   . Alcohol abuse Neg Hx   . Arthritis Neg Hx   . Birth defects Neg Hx   . COPD Neg Hx   . Depression Neg Hx   . Drug abuse Neg Hx   . Early death Neg Hx   . Hearing loss Neg Hx   . Hyperlipidemia Neg Hx   . Kidney disease Neg Hx   . Learning disabilities Neg Hx   . Mental illness Neg Hx   . Mental retardation Neg Hx   . Miscarriages / Stillbirths Neg Hx   . Vision loss Neg Hx   . Varicose Veins Neg Hx      Exam         Skin: normal coloration and turgor, no rashes    Neurologic: oriented, normal   Extremities: normal strength, tone, and muscle mass   HEENT PERRLA   Mouth/Teeth mucous membranes moist, pharynx normal without lesions   Neck supple  Cardiovascular: regular rate and rhythm   Respiratory:  appears well, vitals normal, no respiratory distress, acyanotic, normal RR, ear and throat exam is normal, neck free of mass or lymphadenopathy, chest clear, no wheezing, crepitations, rhonchi, normal symmetric air entry   Abdomen: soft, non-tender; bowel sounds normal; no masses,  no organomegaly          Assessment:    Pregnancy: [id number] Patient Active Problem List   Diagnosis Date Noted  . Supervision of high risk pregnancy, antepartum 09/11/2020  . History of preterm premature rupture of membranes (PPROM) 03/17/2019  . History of herpes genitalis 02/07/2015  . History of preterm delivery 01/09/2015  . Maternal varicella, non-immune 01/09/2015        Plan:    Supervision of high risk pregnancy -welcomed to practice.  Initial labs drawn. Patient declines pap smear today and did not leave urine. Recommend this at next visit.  Discussed makena injections with patient today, she declines.  Discussed history of depression and history of postpartum depression. Offered referral to counseling with our practice and she declines. Declines medication therapy. Continue to monitor.  Prenatal vitamins. Problem list reviewed and updated. Genetic Screening  discussed First Screen, Integrated Screen and Quad Screen: requested.  Ultrasound discussed; fetal survey: requested.  Follow up in 4 weeks.    Gita Kudo 09/11/2020

## 2020-09-12 ENCOUNTER — Encounter: Payer: Self-pay | Admitting: *Deleted

## 2020-09-12 LAB — HEMOGLOBIN A1C
Est. average glucose Bld gHb Est-mCnc: 103 mg/dL
Hgb A1c MFr Bld: 5.2 % (ref 4.8–5.6)

## 2020-09-12 LAB — CBC/D/PLT+RPR+RH+ABO+RUBIGG...
Antibody Screen: NEGATIVE
Basophils Absolute: 0 10*3/uL (ref 0.0–0.2)
Basos: 0 %
EOS (ABSOLUTE): 0.2 10*3/uL (ref 0.0–0.4)
Eos: 2 %
HCV Ab: 0.1 s/co ratio (ref 0.0–0.9)
HIV Screen 4th Generation wRfx: NONREACTIVE
Hematocrit: 41.4 % (ref 34.0–46.6)
Hemoglobin: 13.9 g/dL (ref 11.1–15.9)
Hepatitis B Surface Ag: NEGATIVE
Immature Grans (Abs): 0 10*3/uL (ref 0.0–0.1)
Immature Granulocytes: 0 %
Lymphocytes Absolute: 1.9 10*3/uL (ref 0.7–3.1)
Lymphs: 20 %
MCH: 30.3 pg (ref 26.6–33.0)
MCHC: 33.6 g/dL (ref 31.5–35.7)
MCV: 90 fL (ref 79–97)
Monocytes Absolute: 0.8 10*3/uL (ref 0.1–0.9)
Monocytes: 8 %
Neutrophils Absolute: 6.8 10*3/uL (ref 1.4–7.0)
Neutrophils: 70 %
Platelets: 234 10*3/uL (ref 150–450)
RBC: 4.58 x10E6/uL (ref 3.77–5.28)
RDW: 12.7 % (ref 11.7–15.4)
RPR Ser Ql: NONREACTIVE
Rh Factor: POSITIVE
Rubella Antibodies, IGG: 9.96 index (ref >0.99)
WBC: 9.8 10*3/uL (ref 3.4–10.8)

## 2020-09-12 LAB — HCV INTERPRETATION

## 2020-09-16 IMAGING — US TRANSVAGINAL OB ULTRASOUND
1 series · 15 of 28 positions shown · non-contrast
Comparison: 08/02/2018 obstetric scan.

CLINICAL DATA: 28-year-old pregnant female presents for assessment
of fetal dating and viability. No acute symptoms reported.
Quantitative beta hCG 3,928 on 08/13/2018.

EDC by LMP: 04/08/2019, projecting to an expected gestational age of
6 weeks 6 days.
EXAM:
TRANSVAGINAL OB ULTRASOUND
TECHNIQUE: Transvaginal ultrasound was performed for complete evaluation of the
gestation as well as the maternal uterus, adnexal regions, and
pelvic cul-de-sac.

[Series 1: transvaginal ob ultrasound · 32 acquisitions, 15 frames shown]
[im 1/32]
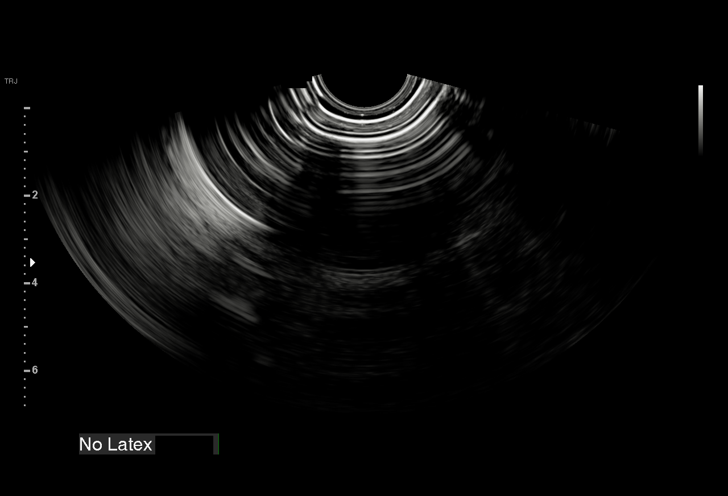
[im 3/32]
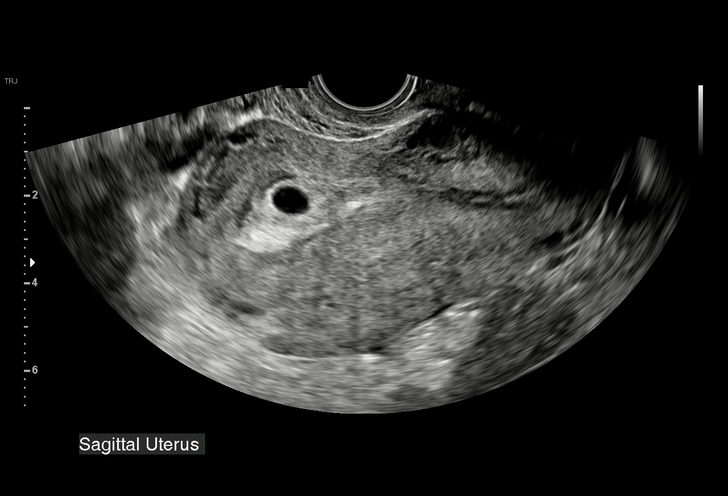
[im 5/32]
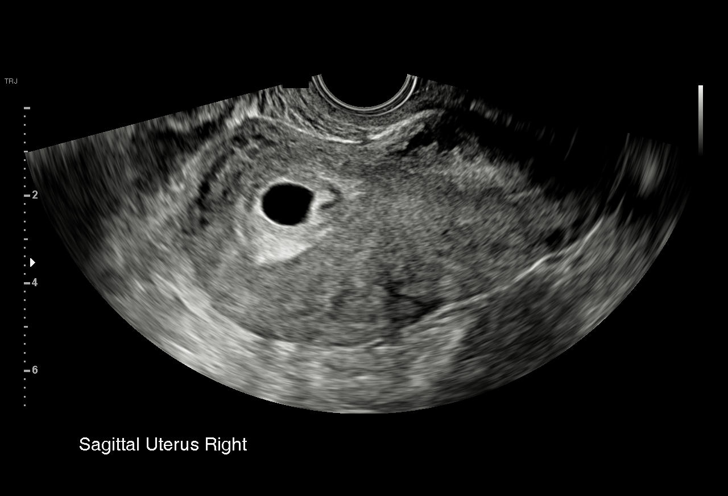
[im 7/32]
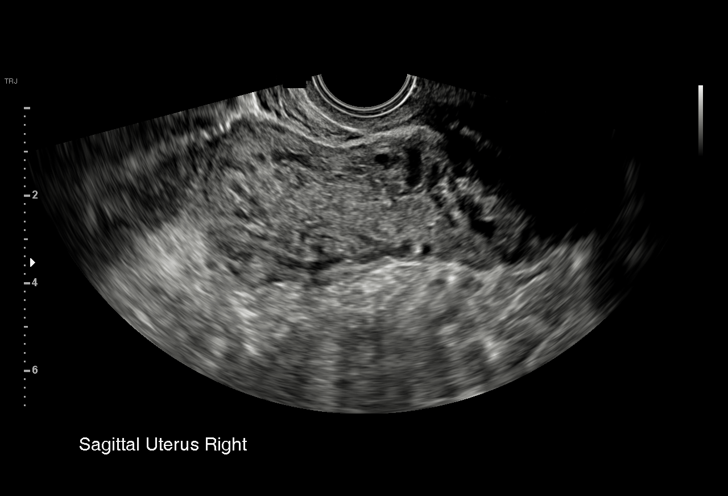
[im 10/32]
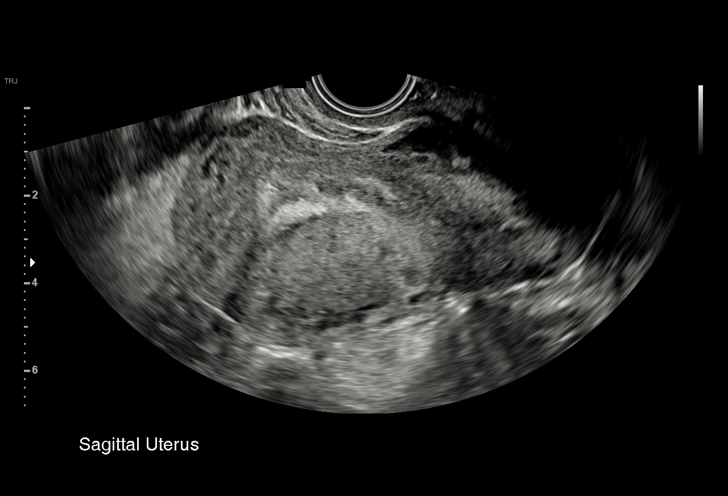
[im 12/32]
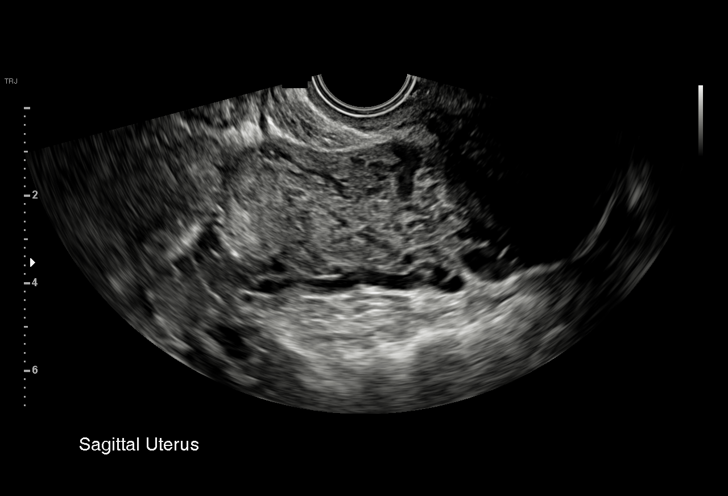
[im 14/32]
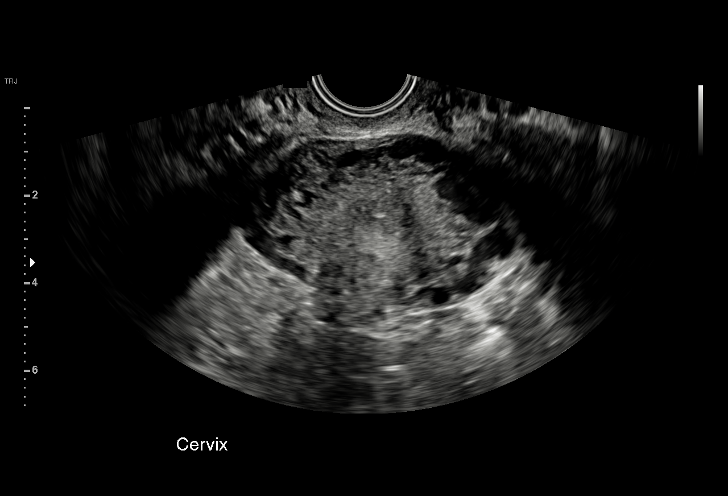
[im 17/32]
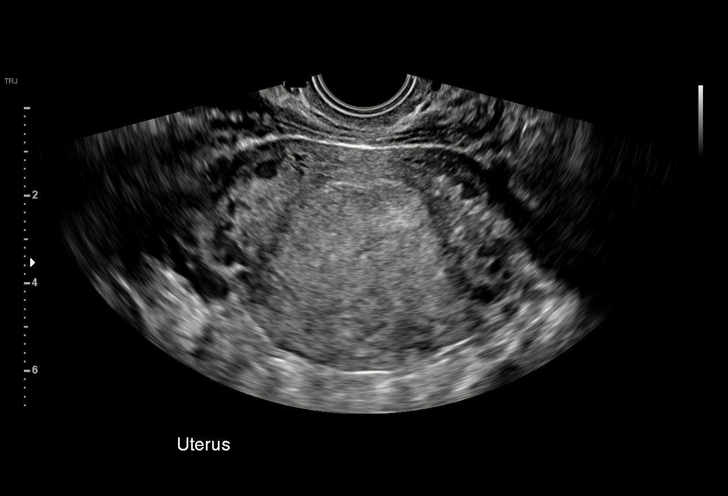
[im 18/32]
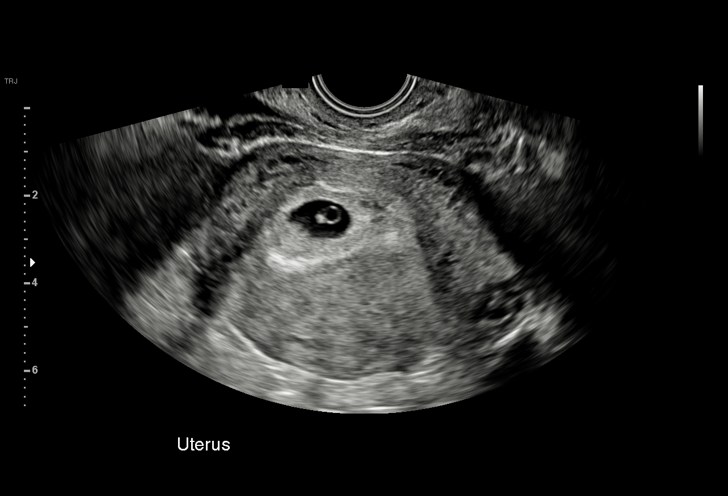
[im 20/32]
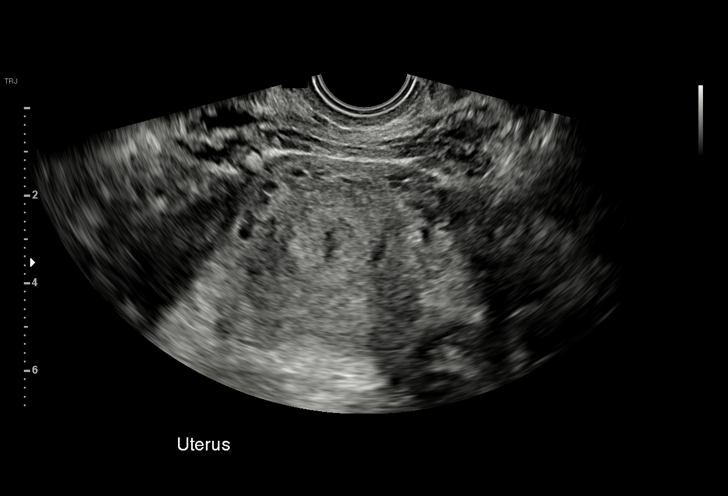
[im 22/32]
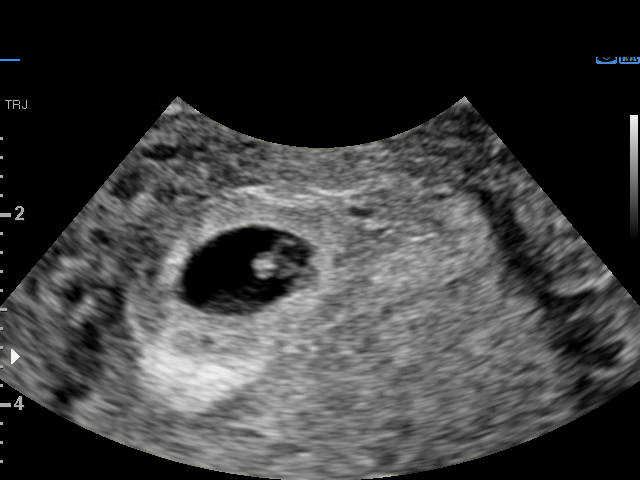
[im 25/32]
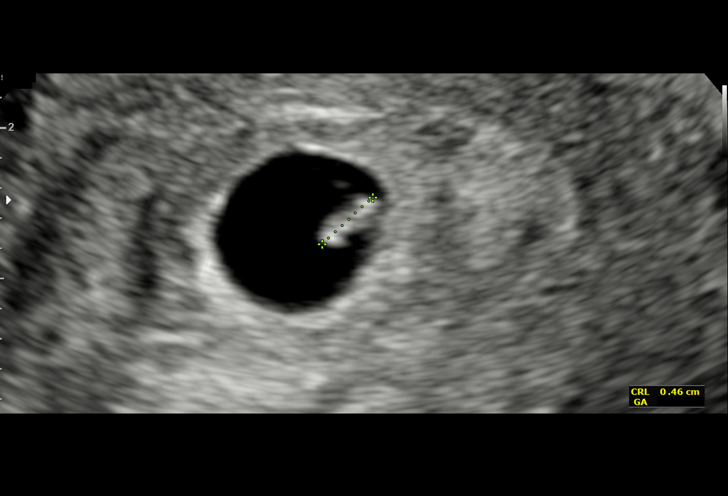
[im 27/32]
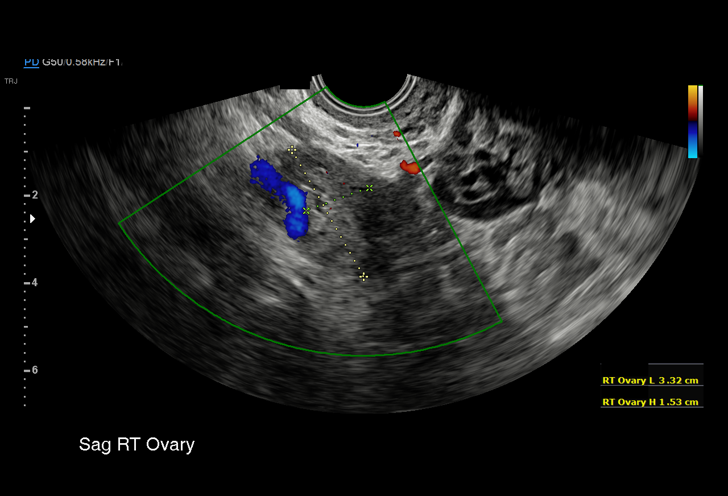
[im 29/32]
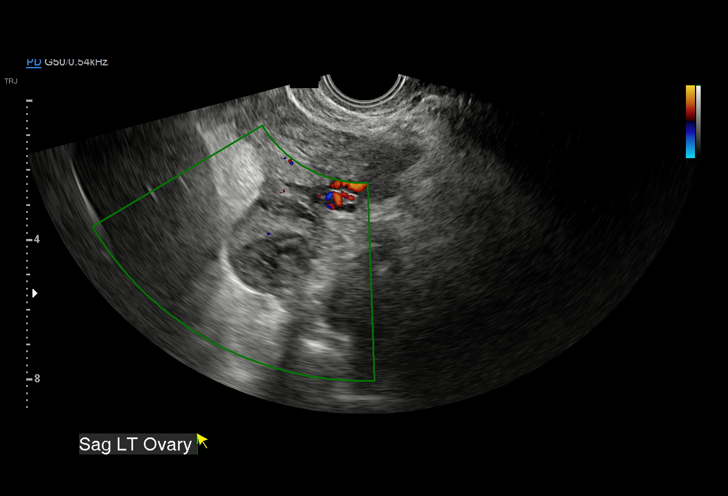
[im 32/32]
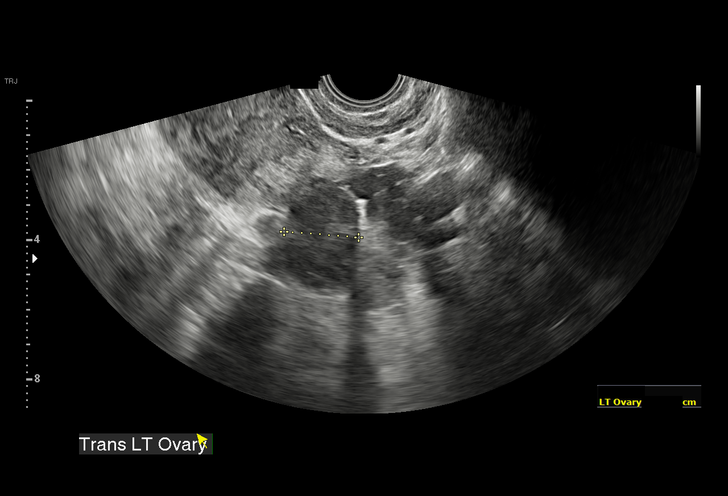

[15 of 28 positions shown; findings below may reference images not displayed]

FINDINGS: Intrauterine gestational sac: Single

Yolk sac:  Visualized.

Embryo:  Visualized.

Cardiac Activity: Visualized.

Heart Rate: 110 bpm

CRL:   4.5 mm   6 w 1 d                  US EDC: 04/13/2019

Subchorionic hemorrhage:  None visualized.

Maternal uterus/adnexae: Anteverted uterus. No uterine fibroids.
Right ovary measures 3.3 x 1.5 x 2.4 cm. Left ovary measures 3.1 x
2.5 x 2.1 cm and contains a corpus luteum. No abnormal ovarian or
adnexal masses. No abnormal free fluid in the pelvis.
IMPRESSION: 1. Single living intrauterine gestation at 6 weeks 1 day by
crown-rump length, without significant discrepancy with provided
menstrual dating. Embryonic heart rate 110 bpm, low normal at this
early gestational age. Follow-up obstetric scan may be considered in
1 month to assess for appropriate fetal growth, as clinically
warranted.
2. No ovarian or adnexal abnormality.

## 2020-10-04 ENCOUNTER — Encounter: Payer: Self-pay | Admitting: Medical

## 2020-10-05 ENCOUNTER — Encounter: Payer: Self-pay | Admitting: *Deleted

## 2020-10-05 ENCOUNTER — Telehealth: Payer: Self-pay | Admitting: *Deleted

## 2020-10-05 NOTE — Telephone Encounter (Signed)
Accommodations request scanned under Documents tab in Registration.

## 2020-10-05 NOTE — Telephone Encounter (Signed)
Late entry- power suurge due to strom on 10-04-20.  Call to patient on 6-16 at 4:15 pm.  Responding to patient request for Accommodations forms to work from home and change hours from 1-10pm to 8-5pm.  Reviewed with provider: I think she would need to discuss this in person with a provider first. We don't routinely make work adjustments for low risk pregnancy at 12 weeks. She can discussed on 6/23.   Thanks!  Amil Amen  Patient has appointment scheduled on 10-11-20.  Called to review with patient. 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 Attempt to explain that "pregancy" is not enough of a  reason to make these accommodation without provider review and discussion. ( Forms were dropped off on 09-19-20)  Patient very loud and yelled she was moving her care to another office and that this was b........ then call was disconnected.  See Scanned note from patient with her requests.   Encounter closed.

## 2020-10-08 ENCOUNTER — Telehealth: Payer: Self-pay

## 2020-10-08 NOTE — Telephone Encounter (Addendum)
Called pt in regards to her concern about her accomendation form.  Pt was very upset because she stated that she spoke with Kennon Rounds and was told that she would need to speak with a provider about working from home.  I explained to the patient that is protocol.  Kennon Rounds can not write someone out without provider permitted it.  I informed the patient that I would reach out to the provider to see if she can get permitted to work from home.  I informed pt that we will evaluate at her visit with Dr. Alvester Morin.  Pt verbalized understanding with no further questions.   Leonette Nutting

## 2020-10-11 ENCOUNTER — Other Ambulatory Visit: Payer: Self-pay

## 2020-10-11 ENCOUNTER — Encounter: Payer: Self-pay | Admitting: Family Medicine

## 2020-10-11 ENCOUNTER — Ambulatory Visit (INDEPENDENT_AMBULATORY_CARE_PROVIDER_SITE_OTHER): Payer: Medicaid Other | Admitting: Family Medicine

## 2020-10-11 VITALS — BP 107/81 | HR 84 | Wt 178.9 lb

## 2020-10-11 DIAGNOSIS — Z8751 Personal history of pre-term labor: Secondary | ICD-10-CM

## 2020-10-11 DIAGNOSIS — O0990 Supervision of high risk pregnancy, unspecified, unspecified trimester: Secondary | ICD-10-CM

## 2020-10-11 DIAGNOSIS — Z8759 Personal history of other complications of pregnancy, childbirth and the puerperium: Secondary | ICD-10-CM

## 2020-10-11 DIAGNOSIS — Z8619 Personal history of other infectious and parasitic diseases: Secondary | ICD-10-CM

## 2020-10-11 DIAGNOSIS — O219 Vomiting of pregnancy, unspecified: Secondary | ICD-10-CM

## 2020-10-11 MED ORDER — METOCLOPRAMIDE HCL 10 MG PO TABS: 10.0000 mg | ORAL_TABLET | Freq: Three times a day (TID) | ORAL | 1 refills | Status: DC

## 2020-10-11 MED ORDER — ONDANSETRON 4 MG PO TBDP: 4.0000 mg | ORAL_TABLET | Freq: Four times a day (QID) | ORAL | 0 refills | Status: DC | PRN

## 2020-10-11 NOTE — Progress Notes (Signed)
   PRENATAL VISIT NOTE  Subjective:  Tara Sanchez is a 30 y.o. [phone] at [redacted]w[redacted]d being seen today for ongoing prenatal care.  She is currently monitored for the following issues for this high-risk pregnancy and has History of preterm delivery; Maternal varicella, non-immune; History of herpes genitalis; History of preterm premature rupture of membranes (PPROM); and Supervision of high risk pregnancy, antepartum on their problem list.  Patient reports nausea.  Contractions: Not present. Vag. Bleeding: None.  Movement: Absent. Denies leaking of fluid.   The following portions of the patient's history were reviewed and updated as appropriate: allergies, current medications, past family history, past medical history, past social history, past surgical history and problem list.   Objective:   Vitals:   10/11/20 0929  BP: 107/81  Pulse: 84  Weight: 178 lb 14.4 oz (81.1 kg)    Fetal Status:   Fundal Height: 151 cm Movement: Absent     General:  Alert, oriented and cooperative. Patient is in no acute distress.  Skin: Skin is warm and dry. No rash noted.   Cardiovascular: Normal heart rate noted  Respiratory: Normal respiratory effort, no problems with respiration noted  Abdomen: Soft, gravid, appropriate for gestational age.  Pain/Pressure: Present     Pelvic: Cervical exam deferred        Extremities: Normal range of motion.  Edema: None  Mental Status: Normal mood and affect. Normal behavior. Normal judgment and thought content.   Assessment and Plan:  Pregnancy: [id number] at [redacted]w[redacted]d 1. Supervision of high risk pregnancy, antepartum - Has h/o bipolar 2-- stable currently. Sees [facility] - Declined pap today-- will consider getting when her n/v improves - Genetic Screening - Culture, OB Urine  2. Nausea and vomiting during pregnancy prior to [redacted] weeks gestation Continue reglan  - ondansetron (ZOFRAN ODT) 4 MG disintegrating tablet; Take 1 tablet (4 mg total) by mouth every 6 (six) hours as  needed for nausea.  Dispense: 20 tablet; Refill: 0 - metoCLOPramide (REGLAN) 10 MG tablet; Take 1 tablet (10 mg total) by mouth 3 (three) times daily before meals.  Dispense: 90 tablet; Refill: 1  3. History of preterm premature rupture of membranes (PPROM) Not on 17 P  4. History of herpes genitalis PPX at 36 wk  5. History of preterm delivery   Preterm labor symptoms and general obstetric precautions including but not limited to vaginal bleeding, contractions, leaking of fluid and fetal movement were reviewed in detail with the patient. Please refer to After Visit Summary for other counseling recommendations.   Return in about 4 weeks (around 11/08/2020) for Routine prenatal care, MD or APP, Telehealth/Virtual health OB Visit.  Future Appointments  Date Time Provider Department Center  11/08/2020 10:55 AM Federico Flake, MD [facility]  11/19/2020  8:00 AM [facility] NURSE [facility]  11/19/2020  8:15 AM [facility]    Federico Flake, MD

## 2020-10-11 NOTE — Patient Instructions (Signed)
Safe Medications in Pregnancy   Nausea/Vomiting:  Bonine Dramamine Emetrol Ginger extract Sea bands Meclizine  Nausea medication to take during pregnancy:  Unisom (doxylamine succinate 25 mg tablets) Take one tablet daily at bedtime. If symptoms are not adequately controlled, the dose can be increased to a maximum recommended dose of two tablets daily (1/2 tablet in the morning, 1/2 tablet mid-afternoon and one at bedtime). Vitamin B6 100mg tablets. Take one tablet twice a day (up to 200 mg per day).    

## 2020-10-24 ENCOUNTER — Telehealth: Payer: Self-pay | Admitting: *Deleted

## 2020-10-24 NOTE — Telephone Encounter (Signed)
Patient left message regarding accommodation request for employer.  Accommodation was to allow patient to work from home. Included was recommendation for routine pregnancy accommodations for office visits and breaks every two hours. Patient states she needs dates of prenatal visits to be listed. Accommodation request was for patient to work from home. Explained to patient that if prenatal visits are used as partial days MAY count in her total of FMLA time after delivery and decrease her approved time out.  Spoke to [employer] with Accommodations for [employer] who agrees with possible FMLA for pre-natal visits and will address that with patient. Updated forms reviewed with Selena Batten and faxed- patient notified.   Encounter closed.

## 2020-11-07 ENCOUNTER — Encounter: Payer: Self-pay | Admitting: *Deleted

## 2020-11-08 ENCOUNTER — Telehealth: Payer: Medicaid Other | Admitting: Family Medicine

## 2020-11-19 ENCOUNTER — Ambulatory Visit: Payer: BC Managed Care – PPO

## 2020-11-19 ENCOUNTER — Other Ambulatory Visit: Payer: Self-pay

## 2020-11-19 ENCOUNTER — Ambulatory Visit (INDEPENDENT_AMBULATORY_CARE_PROVIDER_SITE_OTHER): Payer: BC Managed Care – PPO | Admitting: Family Medicine

## 2020-11-19 ENCOUNTER — Encounter: Payer: Self-pay | Admitting: Family Medicine

## 2020-11-19 VITALS — BP 128/80 | HR 101 | Wt 184.4 lb

## 2020-11-19 DIAGNOSIS — Z8619 Personal history of other infectious and parasitic diseases: Secondary | ICD-10-CM

## 2020-11-19 DIAGNOSIS — G5602 Carpal tunnel syndrome, left upper limb: Secondary | ICD-10-CM

## 2020-11-19 DIAGNOSIS — Z8751 Personal history of pre-term labor: Secondary | ICD-10-CM

## 2020-11-19 DIAGNOSIS — O0990 Supervision of high risk pregnancy, unspecified, unspecified trimester: Secondary | ICD-10-CM

## 2020-11-19 DIAGNOSIS — O09899 Supervision of other high risk pregnancies, unspecified trimester: Secondary | ICD-10-CM

## 2020-11-19 DIAGNOSIS — M79605 Pain in left leg: Secondary | ICD-10-CM

## 2020-11-19 DIAGNOSIS — Z3A19 19 weeks gestation of pregnancy: Secondary | ICD-10-CM

## 2020-11-19 DIAGNOSIS — Z2839 Other underimmunization status: Secondary | ICD-10-CM

## 2020-11-19 NOTE — Progress Notes (Signed)
Subjective:  Tara Sanchez is a 30 y.o. [id number] at [redacted]w[redacted]d being seen today for ongoing prenatal care.  She is currently monitored for the following issues for this high-risk pregnancy and has History of preterm delivery; Maternal varicella, non-immune; History of herpes genitalis; History of preterm premature rupture of membranes (PPROM); and Supervision of high risk pregnancy, antepartum on their problem list.  Patient reports intermittent sharp pain down her posterior left leg for the past two or so weeks. No preceding injury or trauma. Will have numbness of that leg associated with the pains and feels like it will "give out". Has had a few falls because of it (without injury, not onto abdomen). Denies any bowel/bladder changes, saddle anesthesia, persistent numbness/weakness. No severe low back pain with it. Has tried heat with little relief. Has not had this before. Also types for work, has noticed some numbness of her left hand with typing, goes away after 10 minutes if she rests it.  Contractions: Irritability. Vag. Bleeding: None.  Movement: Present. Denies leaking of fluid.   The following portions of the patient's history were reviewed and updated as appropriate: allergies, current medications, past family history, past medical history, past social history, past surgical history and problem list.   Objective:   Vitals:   11/19/20 1050  BP: 128/80  Pulse: (!) 101  Weight: 184 lb 6.4 oz (83.6 kg)    Fetal Status: Fetal Heart Rate (bpm): 150   Movement: Present     General:  Alert, oriented and cooperative. Patient is in no acute distress.  Skin: Skin is warm and dry. No rash noted.   Cardiovascular: Normal heart rate noted  Respiratory: Normal respiratory effort, no problems with respiration noted  Abdomen: Soft, gravid, appropriate for gestational age under umbilicus. Pain/Pressure: Absent     Pelvic:  Cervical exam deferred        Extremities: Edema: None Lumbar spine: -  Inspection: no gross deformity or asymmetry, swelling or ecchymosis - Palpation: No TTP over the spinous processes, paraspinal muscles, or SI joints b/l, however tender to palpation of left hamstring muscle body - ROM: full active ROM of the lumbar spine in flexion and extension without pain - Strength: 5/5 strength of lower extremity in L4-S1 nerve root distributions b/l; normal gait - Neuro: sensation intact in the L4-S1 nerve root distribution b/l  Mental Status: Normal mood and affect. Normal behavior. Normal judgment and thought content.    Assessment and Plan:  Pregnancy: [id number] at [redacted]w[redacted]d  1. Supervision of high risk pregnancy, antepartum Anatomy U/S rescheduled for 11/30/20.   2. [redacted] weeks gestation of pregnancy  3. History of herpes genitalis Valtrex at 36 weeks.   4. History of preterm delivery Declined 17-p. Aware of pre-term labor s/sx.   5. Maternal varicella, non-immune MMR pp.   6. Leg pain, posterior, left Suspicious for sciatica especially with posterior sharp pain in current pregnancy vs hamstring strain/tendinopathy. Encouraged ice/heat of her leg, massage, walking, and relative rest when needed. Provided with stretches to do daily. Referral to PT placed, especially given how bothersome this has been for patient. Alarm s/sx were discussed.   7. Carpal tunnel syndrome of left wrist Encouraging getting a keyboard cushion for support, may try cock-up splint when needed.   Preterm labor symptoms and general obstetric precautions including but not limited to vaginal bleeding, contractions, leaking of fluid and fetal movement were reviewed in detail with the patient.  Please refer to After Visit Summary for other counseling  recommendations.   Return in about 4 weeks (around 12/17/2020) for HROB or sooner if needed for above concerns.    Patriciaann Clan, DO

## 2020-11-19 NOTE — Patient Instructions (Addendum)
1. Apply a heat or an ice pack to the painful area for 10 minutes. 2. Keep mobile. Try not to stay still for long periods. 4. Avoid high heels and wear flat, soft shoes. 5. Do not lift anything heavy. 6. Listen to your body and avoid painful activities.   For your wrist- get a gel piece to place under your wrist while typing. You can also order a "Cock up splint" for carpal tunnel.   If you have any persistent weakness, numbness, bowel incontinence, or symptoms getting worse--please be seen in the ED.      Second Trimester of Pregnancy  The second trimester of pregnancy is from week 13 through week 27. This is months 4 through 6 of pregnancy. The second trimester is often a time when you feel your best. Your body has adjusted to being pregnant, and you begin to feelbetter physically. During the second trimester: Morning sickness has lessened or stopped completely. You may have more energy. You may have an increase in appetite. The second trimester is also a time when the unborn baby (fetus) is growing rapidly. At the end of the sixth month, the fetus may be up to 12 inches long and weigh about 1 pounds. You will likely begin to feel the baby move (quickening) between 16 and 20 weeks of pregnancy. Body changes during your second trimester Your body continues to go through many changes during your second trimester.The changes vary and generally return to normal after the baby is born. Physical changes Your weight will continue to increase. You will notice your lower abdomen bulging out. You may begin to get stretch marks on your hips, abdomen, and breasts. Your breasts will continue to grow and to become tender. Dark spots or blotches (chloasma or mask of pregnancy) may develop on your face. A dark line from your belly button to the pubic area (linea nigra) may appear. You may have changes in your hair. These can include thickening of your hair, rapid growth, and changes in texture. Some  people also have hair loss during or after pregnancy, or hair that feels dry or thin. Health changes You may develop headaches. You may have heartburn. You may develop constipation. You may develop hemorrhoids or swollen, bulging veins (varicose veins). Your gums may bleed and may be sensitive to brushing and flossing. You may urinate more often because the fetus is pressing on your bladder. You may have back pain. This is caused by: Weight gain. Pregnancy hormones that are relaxing the joints in your pelvis. A shift in weight and the muscles that support your balance. Follow these instructions at home: Medicines Follow your health care provider's instructions regarding medicine use. Specific medicines may be either safe or unsafe to take during pregnancy. Do not take any medicines unless approved by your health care provider. Take a prenatal vitamin that contains at least 600 micrograms (mcg) of folic acid. Eating and drinking Eat a healthy diet that includes fresh fruits and vegetables, whole grains, good sources of protein such as meat, eggs, or tofu, and low-fat dairy products. Avoid raw meat and unpasteurized juice, milk, and cheese. These carry germs that can harm you and your baby. You may need to take these actions to prevent or treat constipation: Drink enough fluid to keep your urine pale yellow. Eat foods that are high in fiber, such as beans, whole grains, and fresh fruits and vegetables. Limit foods that are high in fat and processed sugars, such as fried or sweet foods.  Activity Exercise only as directed by your health care provider. Most people can continue their usual exercise routine during pregnancy. Try to exercise for 30 minutes at least 5 days a week. Stop exercising if you develop contractions in your uterus. Stop exercising if you develop pain or cramping in the lower abdomen or lower back. Avoid exercising if it is very hot or humid or if you are at a high  altitude. Avoid heavy lifting. If you choose to, you may have sex unless your health care provider tells you not to. Relieving pain and discomfort Wear a supportive bra to prevent discomfort from breast tenderness. Take warm sitz baths to soothe any pain or discomfort caused by hemorrhoids. Use hemorrhoid cream if your health care provider approves. Rest with your legs raised (elevated) if you have leg cramps or low back pain. If you develop varicose veins: Wear support hose as told by your health care provider. Elevate your feet for 15 minutes, 3-4 times a day. Limit salt in your diet. Safety Wear your seat belt at all times when driving or riding in a car. Talk with your health care provider if someone is verbally or physically abusive to you. Lifestyle Do not use hot tubs, steam rooms, or saunas. Do not douche. Do not use tampons or scented sanitary pads. Avoid cat litter boxes and soil used by cats. These carry germs that can cause birth defects in the baby and possibly loss of the fetus by miscarriage or stillbirth. Do not use herbal remedies, alcohol, illegal drugs, or medicines that are not approved by your health care provider. Chemicals in these products can harm your baby. Do not use any products that contain nicotine or tobacco, such as cigarettes, e-cigarettes, and chewing tobacco. If you need help quitting, ask your health care provider. General instructions During a routine prenatal visit, your health care provider will do a physical exam and other tests. He or she will also discuss your overall health. Keep all follow-up visits. This is important. Ask your health care provider for a referral to a local prenatal education class. Ask for help if you have counseling or nutritional needs during pregnancy. Your health care provider can offer advice or refer you to specialists for help with various needs. Where to find more information American Pregnancy Association:  americanpregnancy.org [employer] of Obstetricians and Gynecologists: https://www.todd-brady.net/ Office on [employer] Health: MightyReward.co.nz Contact a health care provider if you have: A headache that does not go away when you take medicine. Vision changes or you see spots in front of your eyes. Mild pelvic cramps, pelvic pressure, or nagging pain in the abdominal area. Persistent nausea, vomiting, or diarrhea. A bad-smelling vaginal discharge or foul-smelling urine. Pain when you urinate. Sudden or extreme swelling of your face, hands, ankles, feet, or legs. A fever. Get help right away if you: Have fluid leaking from your vagina. Have spotting or bleeding from your vagina. Have severe abdominal cramping or pain. Have difficulty breathing. Have chest pain. Have fainting spells. Have not felt your baby move for the time period told by your health care provider. Have new or increased pain, swelling, or redness in an arm or leg. Summary The second trimester of pregnancy is from week 13 through week 27 (months 4 through 6). Do not use herbal remedies, alcohol, illegal drugs, or medicines that are not approved by your health care provider. Chemicals in these products can harm your baby. Exercise only as directed by your health care provider. Most people  can continue their usual exercise routine during pregnancy. Keep all follow-up visits. This is important. This information is not intended to replace advice given to you by your health care provider. Make sure you discuss any questions you have with your healthcare provider. Document Revised: 09/14/2019 Document Reviewed: 07/21/2019 Elsevier Patient Education  2022 [employer].

## 2020-11-19 NOTE — Addendum Note (Signed)
Addended by: Allayne Stack on: 11/19/2020 11:56 AM   Modules accepted: Orders

## 2020-11-30 ENCOUNTER — Encounter: Payer: Self-pay | Admitting: *Deleted

## 2020-11-30 ENCOUNTER — Ambulatory Visit: Payer: BC Managed Care – PPO | Admitting: *Deleted

## 2020-11-30 ENCOUNTER — Ambulatory Visit: Payer: BC Managed Care – PPO | Attending: Obstetrics and Gynecology

## 2020-11-30 ENCOUNTER — Other Ambulatory Visit: Payer: Self-pay

## 2020-11-30 VITALS — BP 109/55 | HR 73

## 2020-11-30 DIAGNOSIS — O0990 Supervision of high risk pregnancy, unspecified, unspecified trimester: Secondary | ICD-10-CM | POA: Diagnosis not present

## 2020-12-03 ENCOUNTER — Other Ambulatory Visit: Payer: Self-pay | Admitting: *Deleted

## 2020-12-03 DIAGNOSIS — Z362 Encounter for other antenatal screening follow-up: Secondary | ICD-10-CM

## 2020-12-05 ENCOUNTER — Ambulatory Visit: Payer: BC Managed Care – PPO | Attending: Family Medicine | Admitting: Physical Therapy

## 2020-12-05 ENCOUNTER — Telehealth: Payer: Self-pay | Admitting: Physical Therapy

## 2020-12-05 NOTE — Telephone Encounter (Signed)
PT attempted to call patient this morning about pt not arriving for 845am evaluation appointment. Pt did not answer and voice mailbox is full, PT unable to leave message.   Thank you. Otelia Sergeant, PT 08/17/229:04 AM

## 2020-12-07 ENCOUNTER — Ambulatory Visit: Payer: BC Managed Care – PPO | Admitting: Physical Therapy

## 2020-12-12 ENCOUNTER — Inpatient Hospital Stay (HOSPITAL_BASED_OUTPATIENT_CLINIC_OR_DEPARTMENT_OTHER): Payer: BC Managed Care – PPO

## 2020-12-12 ENCOUNTER — Encounter (HOSPITAL_COMMUNITY): Payer: Self-pay | Admitting: Obstetrics and Gynecology

## 2020-12-12 ENCOUNTER — Inpatient Hospital Stay (HOSPITAL_COMMUNITY)
Admission: AD | Admit: 2020-12-12 | Discharge: 2020-12-12 | Disposition: A | Discharge status: Home / Self Care | Payer: BC Managed Care – PPO | Attending: Obstetrics and Gynecology | Admitting: Obstetrics and Gynecology

## 2020-12-12 DIAGNOSIS — Z79899 Other long term (current) drug therapy: Secondary | ICD-10-CM | POA: Insufficient documentation

## 2020-12-12 DIAGNOSIS — O99891 Other specified diseases and conditions complicating pregnancy: Secondary | ICD-10-CM

## 2020-12-12 DIAGNOSIS — O99342 Other mental disorders complicating pregnancy, second trimester: Secondary | ICD-10-CM | POA: Diagnosis not present

## 2020-12-12 DIAGNOSIS — Z3A22 22 weeks gestation of pregnancy: Secondary | ICD-10-CM

## 2020-12-12 DIAGNOSIS — M25571 Pain in right ankle and joints of right foot: Secondary | ICD-10-CM | POA: Diagnosis not present

## 2020-12-12 DIAGNOSIS — W109XXA Fall (on) (from) unspecified stairs and steps, initial encounter: Secondary | ICD-10-CM | POA: Diagnosis not present

## 2020-12-12 DIAGNOSIS — S96911A Strain of unspecified muscle and tendon at ankle and foot level, right foot, initial encounter: Secondary | ICD-10-CM | POA: Diagnosis not present

## 2020-12-12 DIAGNOSIS — O9A212 Injury, poisoning and certain other consequences of external causes complicating pregnancy, second trimester: Secondary | ICD-10-CM | POA: Diagnosis not present

## 2020-12-12 DIAGNOSIS — R109 Unspecified abdominal pain: Secondary | ICD-10-CM | POA: Diagnosis not present

## 2020-12-12 DIAGNOSIS — W19XXXA Unspecified fall, initial encounter: Secondary | ICD-10-CM

## 2020-12-12 DIAGNOSIS — F3181 Bipolar II disorder: Secondary | ICD-10-CM | POA: Diagnosis not present

## 2020-12-12 DIAGNOSIS — O26892 Other specified pregnancy related conditions, second trimester: Secondary | ICD-10-CM

## 2020-12-12 MED ORDER — ACETAMINOPHEN 500 MG PO TABS
1000.0000 mg | ORAL_TABLET | Freq: Once | ORAL | Status: AC
  Administered 2020-12-12: 1000 mg via ORAL
  Filled 2020-12-12: qty 2

## 2020-12-12 NOTE — MAU Provider Note (Addendum)
History     CSN: [id number]  Arrival date and time: 12/12/20 1858   Event Date/Time   First Provider Initiated Contact with Patient 12/12/20 2004      Chief Complaint  Patient presents with   Leg Pain   Fall   HPI Tara Sanchez is a 30 y.o. [id number] at [redacted]w[redacted]d who presents after a fall. She states she was walking down steps and fell. She landed on her abdomen and reports soreness in her legs. She also reports constant struggles with sciatica in her left leg. She denies any bleeding or leaking. Reports normal fetal movement. She reports she is sore but no tightening or pain in her abdomen.   OB History     Gravida  8   Para  4   Term  1   Preterm  3   AB  3   Living  4      SAB  0   IAB  3   Ectopic  0   Multiple  0   Live Births  4          OB History     Gravida  8   Para  4   Term  1   Preterm  3   AB  3   Living  4      SAB  0   IAB  3   Ectopic  0   Multiple  0   Live Births  4          Past Medical History:  Diagnosis Date   Bipolar 2 disorder (HCC)    Genital herpes    Headache    History of preterm delivery, currently pregnant 04/17/2016   makena   Infection    UTI   Preterm labor     Past Surgical History:  Procedure Laterality Date   INDUCED ABORTION     THERAPEUTIC ABORTION      Family History  Problem Relation Age of Onset   Healthy Mother    Healthy Father    Diabetes Paternal Grandmother    Asthma Neg Hx    Cancer Neg Hx    Heart disease Neg Hx    Hypertension Neg Hx    Stroke Neg Hx    Alcohol abuse Neg Hx    Arthritis Neg Hx    Birth defects Neg Hx    COPD Neg Hx    Depression Neg Hx    Drug abuse Neg Hx    Early death Neg Hx    Hearing loss Neg Hx    Hyperlipidemia Neg Hx    Kidney disease Neg Hx    Learning disabilities Neg Hx    Mental illness Neg Hx    Mental retardation Neg Hx    Miscarriages / Stillbirths Neg Hx    Vision loss Neg Hx    Varicose Veins Neg Hx     Social  History   Tobacco Use   Smoking status: Never   Smokeless tobacco: Never  Vaping Use   Vaping Use: Never used  Substance Use Topics   Alcohol use: Not Currently    Comment: Social   Drug use: Not Currently    Types: Marijuana    Comment: Last use 08/01/2018    Allergies:  Allergies  Allergen Reactions   Macrobid [Nitrofurantoin Macrocrystal] Anaphylaxis   Phenergan [Promethazine Hcl] Anaphylaxis   Tessalon [Benzonatate] Anaphylaxis    Medications Prior to Admission  Medication Sig Dispense Refill Last  Dose   Prenatal Vit-Fe Fumarate-FA (PRENATAL VITAMINS) 28-0.8 MG TABS Take 1 tablet by mouth daily. 30 tablet 12 12/12/2020   Doxylamine-Pyridoxine 10-10 MG TBEC Take 100 mg by mouth as directed. 100 mg-Take 2 tablets at Bedtime. May add 1 tablet at breakfast and 1 tablet at lunch if needed. (Patient not taking: No sig reported) 100 tablet 2    metoCLOPramide (REGLAN) 10 MG tablet Take 1 tablet (10 mg total) by mouth 3 (three) times daily before meals. (Patient not taking: Reported on 11/19/2020) 90 tablet 1    ondansetron (ZOFRAN ODT) 4 MG disintegrating tablet Take 1 tablet (4 mg total) by mouth every 6 (six) hours as needed for nausea. (Patient not taking: Reported on 11/19/2020) 20 tablet 0    ondansetron (ZOFRAN) 4 MG tablet ondansetron HCl 4 mg tablet (Patient not taking: No sig reported)       Review of Systems  Constitutional: Negative.  Negative for fatigue and fever.  HENT: Negative.    Respiratory: Negative.  Negative for shortness of breath.   Cardiovascular: Negative.  Negative for chest pain.  Gastrointestinal:  Positive for abdominal pain. Negative for constipation, diarrhea, nausea and vomiting.  Genitourinary: Negative.  Negative for dysuria, vaginal bleeding and vaginal discharge.  Musculoskeletal:  Positive for arthralgias.  Neurological: Negative.  Negative for dizziness and headaches.  Physical Exam   Blood pressure 113/67, pulse 85, temperature 98.4 F (36.9  C), resp. rate 18, height 5\' 7"  (1.702 m), weight 85.7 kg, last menstrual period 05/29/2020, not currently breastfeeding.  Physical Exam Vitals and nursing note reviewed.  Constitutional:      General: She is not in acute distress.    Appearance: She is well-developed.  HENT:     Head: Normocephalic.  Eyes:     Pupils: Pupils are equal, round, and reactive to light.  Cardiovascular:     Rate and Rhythm: Normal rate and regular rhythm.     Heart sounds: Normal heart sounds.  Pulmonary:     Effort: Pulmonary effort is normal. No respiratory distress.     Breath sounds: Normal breath sounds.  Abdominal:     General: Bowel sounds are normal. There is no distension.     Palpations: Abdomen is soft.     Tenderness: There is no abdominal tenderness.  Skin:    General: Skin is warm and dry.  Neurological:     Mental Status: She is alert and oriented to person, place, and time.  Psychiatric:        Mood and Affect: Mood normal.        Behavior: Behavior normal.        Thought Content: Thought content normal.        Judgment: Judgment normal.   FHT: 147 bpm  MAU Course  Procedures  MDM Tylenol PO 07/27/2020 MFM OB Limited  Care turned over to [name] CNM at 2045.  2046, CNM 12/12/20 8:45 PM  12/14/20 reviewed Placenta posterior Fetus vertex Amniotic fluid WNL Cervix 3.5cm  Assessment and Plan  A:  SIngle IUP at [redacted]w[redacted]d       S/P fall        RIght ankle strain  P:   Discharge home       Monitor for pain and bleeding       Ice to ankle (is bearing weight)       Followup in office as scheduled      Encouraged to return if she develops worsening of symptoms,  increase in pain, fever, or other concerning symptoms.   Aviva Signs, CNM

## 2020-12-12 NOTE — MAU Note (Signed)
Pt fell forward down the steps about 30 min ago. Fell on her abd and right leg is what she "twisted but she I having pain in her left hip that radiates down her leg.(Sciatic pain) C/o mild cramping. Denies any vag bleeding or discharge.

## 2020-12-17 ENCOUNTER — Encounter: Payer: Self-pay | Admitting: Obstetrics and Gynecology

## 2020-12-17 ENCOUNTER — Other Ambulatory Visit: Payer: Self-pay

## 2020-12-17 ENCOUNTER — Ambulatory Visit (INDEPENDENT_AMBULATORY_CARE_PROVIDER_SITE_OTHER): Payer: BC Managed Care – PPO | Admitting: Obstetrics and Gynecology

## 2020-12-17 VITALS — BP 117/74 | HR 99 | Wt 190.5 lb

## 2020-12-17 DIAGNOSIS — O3660X0 Maternal care for excessive fetal growth, unspecified trimester, not applicable or unspecified: Secondary | ICD-10-CM | POA: Insufficient documentation

## 2020-12-17 DIAGNOSIS — Z8751 Personal history of pre-term labor: Secondary | ICD-10-CM

## 2020-12-17 DIAGNOSIS — Z8619 Personal history of other infectious and parasitic diseases: Secondary | ICD-10-CM

## 2020-12-17 DIAGNOSIS — Z3A23 23 weeks gestation of pregnancy: Secondary | ICD-10-CM

## 2020-12-17 DIAGNOSIS — Z7409 Other reduced mobility: Secondary | ICD-10-CM

## 2020-12-17 DIAGNOSIS — O3662X0 Maternal care for excessive fetal growth, second trimester, not applicable or unspecified: Secondary | ICD-10-CM

## 2020-12-17 DIAGNOSIS — O0990 Supervision of high risk pregnancy, unspecified, unspecified trimester: Secondary | ICD-10-CM

## 2020-12-17 NOTE — Progress Notes (Signed)
   PRENATAL VISIT NOTE  Subjective:  Tara Sanchez is a 30 y.o. [phone] at [redacted]w[redacted]d being seen today for ongoing prenatal care.  She is currently monitored for the following issues for this high-risk pregnancy and has History of preterm delivery; Maternal varicella, non-immune; History of herpes genitalis; History of preterm premature rupture of membranes (PPROM); Supervision of high risk pregnancy, antepartum; and LGA (large for gestational age) fetus affecting management of mother on their problem list.  Patient reports no complaints.  Contractions: Irritability. Vag. Bleeding: None.  Movement: Present. Denies leaking of fluid.   The following portions of the patient's history were reviewed and updated as appropriate: allergies, current medications, past family history, past medical history, past social history, past surgical history and problem list.   Objective:   Vitals:   12/17/20 0855  BP: 117/74  Pulse: 99  Weight: 190 lb 8 oz (86.4 kg)    Fetal Status: Fetal Heart Rate (bpm): 150   Movement: Present     General:  Alert, oriented and cooperative. Patient is in no acute distress.  Skin: Skin is warm and dry. No rash noted.   Cardiovascular: Normal heart rate noted  Respiratory: Normal respiratory effort, no problems with respiration noted  Abdomen: Soft, gravid, appropriate for gestational age.  Pain/Pressure: Present     Pelvic: Cervical exam deferred        Extremities: Normal range of motion.  Edema: None  Mental Status: Normal mood and affect. Normal behavior. Normal judgment and thought content.   Assessment and Plan:  Pregnancy: [id number] at [redacted]w[redacted]d 1. [redacted] weeks gestation of pregnancy  2. Supervision of high risk pregnancy, antepartum  3. Excessive fetal growth affecting management of pregnancy in second trimester, single or unspecified fetus D/w her that her a1c was negative and likely an incidental finding, especially at this point, and will f/u at subsequent scans. I also  told her that 12/26 is her EDC and we will not change that based on subsequent u/s b/c it was an u/s at 7wks  4. History of preterm delivery Declines 17p  5. History of herpes genitalis Considering start ppx at 28wk give her PTB history  6. Impaired mobility Pt missed PT appts. Recommended she follow up with them  Preterm labor symptoms and general obstetric precautions including but not limited to vaginal bleeding, contractions, leaking of fluid and fetal movement were reviewed in detail with the patient. Please refer to After Visit Summary for other counseling recommendations.   Return in about 11 days (around 12/28/2020) for in person, md or app, low risk ob.  Future Appointments  Date Time Provider Department Center  12/28/2020  8:30 AM [facility] NURSE [facility]  12/28/2020  8:45 AM [facility]  01/01/2021 10:15 AM Brand Males, CNM [facility]    [place] Bing, MD

## 2020-12-28 ENCOUNTER — Encounter: Payer: Self-pay | Admitting: *Deleted

## 2020-12-28 ENCOUNTER — Ambulatory Visit: Payer: BC Managed Care – PPO | Attending: Obstetrics and Gynecology

## 2020-12-28 ENCOUNTER — Other Ambulatory Visit: Payer: Self-pay

## 2020-12-28 ENCOUNTER — Ambulatory Visit: Payer: BC Managed Care – PPO | Admitting: *Deleted

## 2020-12-28 VITALS — BP 118/61 | HR 78

## 2020-12-28 DIAGNOSIS — O0990 Supervision of high risk pregnancy, unspecified, unspecified trimester: Secondary | ICD-10-CM | POA: Insufficient documentation

## 2020-12-28 DIAGNOSIS — Z362 Encounter for other antenatal screening follow-up: Secondary | ICD-10-CM | POA: Diagnosis not present

## 2020-12-28 DIAGNOSIS — Z3A24 24 weeks gestation of pregnancy: Secondary | ICD-10-CM | POA: Diagnosis not present

## 2021-01-01 ENCOUNTER — Other Ambulatory Visit: Payer: Self-pay

## 2021-01-01 ENCOUNTER — Ambulatory Visit (INDEPENDENT_AMBULATORY_CARE_PROVIDER_SITE_OTHER): Payer: BC Managed Care – PPO

## 2021-01-01 VITALS — BP 125/69 | HR 110 | Wt 195.5 lb

## 2021-01-01 DIAGNOSIS — O0990 Supervision of high risk pregnancy, unspecified, unspecified trimester: Secondary | ICD-10-CM

## 2021-01-01 DIAGNOSIS — O3662X0 Maternal care for excessive fetal growth, second trimester, not applicable or unspecified: Secondary | ICD-10-CM

## 2021-01-01 DIAGNOSIS — Z7409 Other reduced mobility: Secondary | ICD-10-CM

## 2021-01-01 DIAGNOSIS — Z3A26 26 weeks gestation of pregnancy: Secondary | ICD-10-CM

## 2021-01-01 DIAGNOSIS — Z8751 Personal history of pre-term labor: Secondary | ICD-10-CM

## 2021-01-01 DIAGNOSIS — Z8619 Personal history of other infectious and parasitic diseases: Secondary | ICD-10-CM
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
## 2021-01-01 NOTE — Progress Notes (Signed)
Patient reports of pelvic pains that happens "mostly at night'

## 2021-01-06 ENCOUNTER — Other Ambulatory Visit: Payer: Self-pay

## 2021-01-06 ENCOUNTER — Inpatient Hospital Stay (HOSPITAL_COMMUNITY): Payer: BC Managed Care – PPO

## 2021-01-06 ENCOUNTER — Inpatient Hospital Stay (HOSPITAL_COMMUNITY)
Admission: AD | Admit: 2021-01-06 | Discharge: 2021-01-06 | Disposition: A | Discharge status: Home / Self Care | Payer: BC Managed Care – PPO | Attending: Obstetrics & Gynecology | Admitting: Obstetrics & Gynecology

## 2021-01-06 ENCOUNTER — Encounter (HOSPITAL_COMMUNITY): Payer: Self-pay | Admitting: Obstetrics & Gynecology

## 2021-01-06 DIAGNOSIS — O26892 Other specified pregnancy related conditions, second trimester: Secondary | ICD-10-CM | POA: Insufficient documentation

## 2021-01-06 DIAGNOSIS — S61251A Open bite of left index finger without damage to nail, initial encounter: Secondary | ICD-10-CM

## 2021-01-06 DIAGNOSIS — Z881 Allergy status to other antibiotic agents status: Secondary | ICD-10-CM | POA: Diagnosis not present

## 2021-01-06 DIAGNOSIS — O9A212 Injury, poisoning and certain other consequences of external causes complicating pregnancy, second trimester: Secondary | ICD-10-CM

## 2021-01-06 DIAGNOSIS — S61259A Open bite of unspecified finger without damage to nail, initial encounter: Secondary | ICD-10-CM

## 2021-01-06 DIAGNOSIS — Z3A25 25 weeks gestation of pregnancy: Secondary | ICD-10-CM | POA: Diagnosis not present

## 2021-01-06 DIAGNOSIS — O368120 Decreased fetal movements, second trimester, not applicable or unspecified: Secondary | ICD-10-CM | POA: Insufficient documentation

## 2021-01-06 DIAGNOSIS — Z79899 Other long term (current) drug therapy: Secondary | ICD-10-CM | POA: Diagnosis not present

## 2021-01-06 DIAGNOSIS — W503XXA Accidental bite by another person, initial encounter: Secondary | ICD-10-CM | POA: Diagnosis not present

## 2021-01-06 DIAGNOSIS — M79643 Pain in unspecified hand: Secondary | ICD-10-CM | POA: Insufficient documentation

## 2021-01-06 DIAGNOSIS — Z888 Allergy status to other drugs, medicaments and biological substances status: Secondary | ICD-10-CM | POA: Insufficient documentation

## 2021-01-06 DIAGNOSIS — O0990 Supervision of high risk pregnancy, unspecified, unspecified trimester: Secondary | ICD-10-CM

## 2021-01-06 DIAGNOSIS — O09212 Supervision of pregnancy with history of pre-term labor, second trimester: Secondary | ICD-10-CM | POA: Diagnosis not present

## 2021-01-06 MED ORDER — AMOXICILLIN-POT CLAVULANATE 875-125 MG PO TABS: 1.0000 | ORAL_TABLET | Freq: Two times a day (BID) | ORAL | 0 refills | Status: DC

## 2021-01-06 MED ORDER — KETOROLAC TROMETHAMINE 60 MG/2ML IM SOLN
60.0000 mg | Freq: Once | INTRAMUSCULAR | Status: AC
  Administered 2021-01-06: 60 mg via INTRAMUSCULAR
  Filled 2021-01-06: qty 2

## 2021-01-06 MED ORDER — TERCONAZOLE 0.4 % VA CREA: 1.0000 | TOPICAL_CREAM | Freq: Every day | VAGINAL | 0 refills | Status: DC
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
## 2021-01-23 ENCOUNTER — Other Ambulatory Visit: Payer: Self-pay

## 2021-01-23 DIAGNOSIS — O0990 Supervision of high risk pregnancy, unspecified, unspecified trimester: Secondary | ICD-10-CM

## 2021-01-24 ENCOUNTER — Other Ambulatory Visit: Payer: Self-pay

## 2021-01-24 ENCOUNTER — Ambulatory Visit (INDEPENDENT_AMBULATORY_CARE_PROVIDER_SITE_OTHER): Payer: BC Managed Care – PPO | Admitting: Student

## 2021-01-24 ENCOUNTER — Other Ambulatory Visit: Payer: BC Managed Care – PPO

## 2021-01-24 VITALS — BP 112/67 | HR 72 | Wt 197.5 lb

## 2021-01-24 DIAGNOSIS — Z23 Encounter for immunization: Secondary | ICD-10-CM | POA: Diagnosis not present

## 2021-01-24 DIAGNOSIS — Z3A28 28 weeks gestation of pregnancy: Secondary | ICD-10-CM

## 2021-01-24 DIAGNOSIS — O0990 Supervision of high risk pregnancy, unspecified, unspecified trimester: Secondary | ICD-10-CM

## 2021-01-24 NOTE — Progress Notes (Signed)
   PRENATAL VISIT NOTE  Subjective:  Tara Sanchez is a 30 y.o. [phone] at [redacted]w[redacted]d being seen today for ongoing prenatal care.  She is currently monitored for the following issues for this low-risk pregnancy and has History of preterm delivery; Maternal varicella, non-immune; History of herpes genitalis; History of preterm premature rupture of membranes (PPROM); Supervision of high risk pregnancy, antepartum; and LGA (large for gestational age) fetus affecting management of mother on their problem list.  Patient reports  difficulty breathing at nigth due to pain on left side underneathe her breast. It has been going on "a while". She reports that it happens more during the day. She reports loss of appetite. She is worried about the babys' growth .  Contractions: Not present. Vag. Bleeding: None.  Movement: Present. Denies leaking of fluid.   The following portions of the patient's history were reviewed and updated as appropriate: allergies, current medications, past family history, past medical history, past social history, past surgical history and problem list.   Objective:   Vitals:   01/24/21 0825  BP: 112/67  Pulse: 72  Weight: 197 lb 8 oz (89.6 kg)    Fetal Status: Fetal Heart Rate (bpm): 153 Fundal Height: 31 cm Movement: Present     General:  Alert, oriented and cooperative. Patient is in no acute distress.  Skin: Skin is warm and dry. No rash noted.   Cardiovascular: Normal heart rate noted  Respiratory: Normal respiratory effort, no problems with respiration noted  Abdomen: Soft, gravid, appropriate for gestational age.  Pain/Pressure: Absent     Pelvic: Cervical exam deferred        Extremities: Normal range of motion.  Edema: None  Mental Status: Normal mood and affect. Normal behavior. Normal judgment and thought content.   Assessment and Plan:  Pregnancy: [id number] at [redacted]w[redacted]d  1. [redacted] weeks gestation of pregnancy   2. Supervision of high risk pregnancy, antepartum   -doing  well -reassurance given about baby's growth; will do [name] if still measuring large two weeks from now -patient wants BTL; signed today -list of peds given -patient will reschedule her 2 hour due to childcare  Preterm labor symptoms and general obstetric precautions including but not limited to vaginal bleeding, contractions, leaking of fluid and fetal movement were reviewed in detail with the patient. Please refer to After Visit Summary for other counseling recommendations.   Return in about 2 weeks (around 02/07/2021), or 2 weeks for LROB with KK and needs 2 hour ASAP.  Future Appointments  Date Time Provider Department Center  02/01/2021  8:20 AM [facility]  02/20/2021  8:35 AM Crisoforo Oxford, Charlesetta Garibaldi, CNM [facility]     Marylene Land, CNM

## 2021-01-24 NOTE — Progress Notes (Signed)
Patient reports difficulty breathing. She stated that " it is hard to breathe and I have to sit up in bed in order for me to not be in pain"

## 2021-01-24 NOTE — Patient Instructions (Signed)
AREA PEDIATRIC/FAMILY PRACTICE PHYSICIANS  [place] ([address]) [facility] Chambliss, MD; Eniola, MD; Hale, MD; Hensel, MD; McDiarmid, MD; McIntyer, MD; Neal, MD; Walden, MD [address] [place], [place] [address] [phone] Mon-Fri 8:30-12:30, 1:30-5:00 Providers come to see babies at [facility] Accepting Medicaid [facility] providers who accept newborns: Koirala, MD; Morrow, MD; Wolters, MD [address], [place], [place] [address] [phone] Mon-Fri 8:00-5:30 Babies seen by providers at [facility] Does NOT accept Medicaid Please call early in hospitalization for appointment (limited availability)  [facility] Mulberry, MD [address] [place], [place] [address] [phone] Mon, Tue, Thur, Fri 8:30-5:00, Wed 10:00-7:00 (closed 1-2pm) Babies seen by [facility] providers Accepting Medicaid Rubin - Pediatrician Rubin, MD [address], [place], [place] [address] [phone] Mon-Fri 8:30-5:00, Sat 8:30-12:00 Provider comes to see babies at [facility] Accepting Medicaid Must have been referred from current patients or contacted office prior to delivery [facility] ([facility]) Brown, MD; Chandler, MD; Ettefagh, MD; Grant, MD; Lester, MD; McCormick, MD; McQueen, MD; Prose, MD; Simha, MD; Stanley, MD; Stryffeler, NP; Tebben, NP [address], [place], [place] [address] [phone] Mon, Tue, Thur, Fri 8:30-5:30, Wed 9:30-5:30, Sat 8:30-12:30 Babies seen by [facility] providers Accepting Medicaid Only accepting infants of first-time parents or siblings of current patients Hospital discharge coordinator will make follow-up appointment Jack Amos [address], [place],   [address] [phone]   Fax - [phone] [facility] [address], [place], [place]  [address] Phone - [phone]   Fax - [phone] Shilpa Gosrani [address], [place], [place]  [address] [phone]  [place] ([address]) [facility] Bates, MD; [place]assfield, MD; Cooper, Cox, MD; MD; Davis, MD; Dovico, MD; Ettefaugh, MD; Little, MD; Lowe, MD; Keiffer, MD; Melvin, MD; Sumner, MD; Williams, MD [address], [place], [place] [address] [phone] Mon-Fri 8:30-5:00 (extended evenings Mon-Thur as needed), Sat-Sun 10:00-1:00 Providers come to see babies at [facility] Accepting Medicaid for families of first-time babies and families with all children in the household age 3 and under. Must register with office prior to making appointment (M-F only). [facility] Henson, NP; Knapp, MD; Lalonde, MD; Tysinger, PA [address] [place], [place] [address] [phone] Mon-Fri 8:00-5:00 Babies seen by providers at [facility] Does NOT accept Medicaid/Commercial Insurance Only [facility] ([facility])  Artis, MD; Barnes, MD; Bratton, MD; Coccaro, MD; Lockett Gardner, MD; Kramer, MD; Marshall, MD; Netherton, MD; Poleto, MD; Skinner, MD [address] [place], [place] [address] [phone] Mon-Fri 8:30-5:30, Sat (Oct.-Mar.) 9:00-1:00 Babies seen by providers at [facility] Accepting Medicaid  [place] ([address]) [facility] Reid, MD; Warner, MD [address], [place],  [address] [phone] Mon-Fri 8:30-5:00, Sat 8:30-12:00 Providers come to see babies at [facility] Does NOT accept Medicaid [facility] Becker, PA; Hagler, MD; Scifres, PA; Sun, MD; Swayne, MD [address], [place],  [address] [phone] Mon-Fri 8:00-5:00 Babies seen by providers at [facility] Does NOT accept Medicaid Only accepting babies of parents who  are patients Please call early in hospitalization for appointment (limited availability) [facility] Clark, MD; Frye, MD; Kelleher, MD; Mack, NP; Miller, MD; O'Keller, MD; Patterson, NP; Pudlo, MD; Puzio, MD; Thomas, MD; Tucker, MD; Twiselton, MD [address], [place], [place] [address] [phone] Mon-Fri 8:00-5:00, Sat 9:00-12:00 Providers come to see babies at [facility] Does NOT accept Medicaid  [place] ([address]) [facility] providers accepting new patients: Brake, NP; Wharton, PA [address], [place], [place] [address] [phone] Mon-Fri 8:00-5:00 Babies seen by providers at [facility] Does NOT accept Medicaid Only accepting babies of parents who are patients Please call early in hospitalization for appointment (limited availability) [facility] Gay, MD; Quinlan, MD [address] [place], [place] [address] [phone] (press 1 to schedule appointment) Mon-Fri 8:00-5:00 Providers come to see babies at [facility] Does NOT accept Medicaid [facility] Mazer, MD [address] [place], [place] [address] [phone] Mon-Fri 8:30-5:00 (lunch 12:30-1:00), extended hours by appointment only Wed 5:00-6:30 Babies seen by [facility] providers Accepting Medicaid [facility] Banks, MD; Jordan, MD; Koberlein, MD [address], [place], [place] [address] [phone] Mon-Fri 8:00-5:00 Babies seen by [facility] providers Does NOT accept Medicaid [facility] Parker, MD; Hunter, MD; Wallace, DO [address] [place], [place] [address] [phone] Mon-Fri 8:00-5:00 Babies seen by [facility] providers Does NOT accept Medicaid [facility] Brandon, PA; Brecken, PA; Christy, NP; Dees, MD; DeClaire, MD; DeWeese, MD; Hansen, NP; Mills, NP; Parrish, NP; Smoot, NP; Summer, MD; Vapne,  MD [address] [place], [place] [address] [phone] Mon-Fri 8:30-5:00, Sat 10:00-1:00 Providers come to see babies at [facility] Does NOT accept Medicaid Free prenatal information session Tuesdays at 4:45pm [facility] Bouska, MD; Gordon, PA; Jeffery, PA; [place], PA [address] [place] [address] [phone] Mon-Fri 7:30-5:30 Babies seen by [facility] providers [facility] [address], [place], [place]  [address] [phone]   Fax - [phone]  [place] ([address] & [address]) [facility] [place] [address] [place], [place] [address] [phone] Mon-Thur 8:00-6:00 Providers come to see babies at [facility] Accepting Medicaid [facility] Anderson, NP; Badger, MD; Beal, PA; Spencer, PA [address] [place], [place] [address] [phone] Mon-Thur 7:30-7:30, Fri 7:30-4:30 Babies seen by [facility] providers Accepting Medicaid [facility] Agbuya, MD; Klett, NP; Romgoolam, MD [address], [place], [place] [address] [phone] Mon-Fri 8:30-5:00, Sat 8:30-12:00 Providers come to see babies at [facility] Accepting Medicaid Must have "Meet & Greet" appointment at office prior to delivery [facility] ([facility]) McCord, MD; Wallace, MD; Wood, MD [address], [place], [place] [address] [phone] Mon-Wed 8:00-6:00, Thur-Fri 8:00-5:00, Sat 9:00-12:00 Providers come to see babies at [facility] Does NOT accept Medicaid Only accepting siblings of current patients [facility]  [address], [place], [place]  [address] [phone]   Fax - [phone] [facility] [address], [place], [place]  [address] [phone]   Fax -  [phone]  [place] ([address] & [address]) [facility] Cirigliano, DO; Matthews, DO [address] [place], [place] [address] [phone] Mon-Fri 7:00-5:00 Babies seen by [facility] providers Does NOT accept Medicaid [facility] Briscoe, MD; Howley, PA; Moreira, PA [address], [place], [place] [address] [phone] Mon-Fri 8:00-5:00 Babies seen by [facility] providers Accepting Medicaid [facility] Boyd, MD; Church, PA; Jones, NP; Osborn, PA [address], , [place] [address] [phone] Mon-Fri 8:00-5:00 Babies seen by providers at [facility] Accepting Medicaid  [place] ([address]) [facility] [place] Wendling, DO [address] [place], [place] [address] [phone] Mon-Fri 8:00-5:00 Babies seen by [facility] providers Does NOT accept Medicaid Limited availability, please call early in hospitalization to schedule follow-up [facility] Calderon, PA; Cummings, MD; Dillard, MD; Martin, PA; Olson, MD; VanDeven, PA [address], [place], [place] [address] [phone] Mon-Fri 8:30-5:00, Sat 9:00-12:00 Babies seen by providers at [facility] Accepting Medicaid Please register online then schedule online or call office [email or web address] Hunter, NP; Kumar, MD; Martin Rogers, PA [address], [place], [place] [address] [phone] Mon-Fri 8:00-5:00 Babies seen by providers at [facility] Accepting Medicaid [facility] ([facility]) Yaphank, MD; Kristi Fleenor, NP; West, MD [address], [place], [place] [address] [phone] Mon-Fri 8:00-5:30, Sat&Sun by appointment (phones open at  8:30) Babies seen by [facility] providers Accepting Medicaid Must be a first-time baby or sibling of current patient [facility] [place]  [address], [place], [place]  [address] [phone]   Fax - [phone]  [place] ([address]) [facility] Brown, PA; Cowen, PA; [place], MD; Helton, PA; Spry, MD [address] [place], [place] [address] [phone] Mon-Thur 8:00-7:00, Fri 8:00-5:00, Sat 8:00-12:00, Sun 9:00-12:00 Babies seen by [facility] providers Accepting Medicaid [facility] [place] Coe-Goins, MD; Marshall, MD; Pierre-Louis, MD [address] [place] [address], [place], [place] [address] [phone] Mon-Thur 8:00-5:00 Babies seen by providers at [facility] Accepting Medicaid [facility] Bratton, MD; Coe-Goins, MD; Hayes, MD; Lewis, MD; List, MD; Lott, MD; Marshall, MD; Moran, MD; O'Neal, MD; Pierre-Louis, MD; Pitonzo, MD; Scholer, MD; Spangle, MD [address] [place], [place] [address] [phone] Mon-Fri 8:00-5:30, Sat (Oct.-Mar.) 9:00-1:00 Babies seen by providers at [facility] Accepting Medicaid Must fill out new patient packet, available online at [email or web address] [facility] ([facility]) Friddle, NP; Harris, NP; Kelly, NP; Logan, MD; Melvin, PA; Poth, MD; Ramadoss, MD; Stanton, NP [address], [place], [place] [address] [phone] Mon-Thur 8:00-5:30, Fri 8:00-5:00 Babies seen by providers at [facility] Accepting Medicaid  [place] ([address]) [facility] Dixon, PA; Branch, MD; Pickard, MD; Tapia, PA [address], [place], [place] [address] [phone] Mon-Fri 8:00-5:00 Babies seen by providers at [facility] Accepting Medicaid   [place] ([address]) [facility] [place] Masneri, DO; Meyers, MD; [place], PA [address], [place], [place] [address] [phone] Mon-Fri 8:00-5:00 Babies seen by providers at [facility] Does NOT accept Medicaid Limited appointment availability, please call early in hospitalization  [facility] [place] Kunedd, DO; McGowen, MD [address], [place], [place] [address] [phone] Mon-Fri 8:00-5:00 Babies seen by [facility] providers Does NOT accept Medicaid [facility] [place] Cameron, MD; MacDonald, MD; Michaels, PA; Nayak, MD [address] [place] [address], [place],  [address] [phone] Mon-Fri 8:00-5:00 After hours clinic ([address] [place],  [address]) [phone] Mon-Fri 5:00-8:00, Sat 12:00-6:00, Sun 10:00-4:00 Babies seen by [facility] providers Accepting Medicaid [facility] [place] [address], [place], [place]  [address] [phone]   Fax - [phone]  [place] ([address]) [facility] [place] [facility] Andy, MD [address], [place], [place] [address] [phone] Mon-Fri 8:00-5:00 Babies seen by [facility] providers Does NOT accept Medicaid [facility] [place] ([facility] [place]) Eksir, MD [address], [place], [place] [address] [phone] Mon-Thur 8:00-7:00, Fri 8:00-5:00, Sat 8:00-12:00 Babies seen by providers at [facility] Accepting Medicaid - but does not have vaccinations in office (must be received elsewhere) Limited availability, please call early in hospitalization  [place] ([address]) [facility]  Charlene Flemming, MD [address], [place] [address] [phone]  Fax [phone]  [facility]  Kimberly Newton, MD, Annamarie Streilein, PA, Carla Hampton, PA [address] [place] [place], [place]  [address] [phone] [facility]  [address], [place], [place] [address] [phone] [address], [place], [place] [address] [phone] (West Office)  [facility] [address], [place], [place] [address] [phone] [facility] [address] [place], [place], [place] [address] [phone] [facility] [address], [place], [place] [address] [phone] [facility] [address], [place], [place] [address] [phone] [facility] [address], [place], [place] [address] [phone] [facility] [address], [place], [place] [address] [phone] [facility]  [address], [place], [place] [address] [phone] [facility] [address] [place] [address], [place], [place] [address] [phone] [facility] [address], [place], [place] [address] [phone] [facility] [address], [place],  [address] [phone]  

## 2021-01-25 LAB — RPR: RPR Ser Ql: NONREACTIVE

## 2021-01-25 LAB — CBC
Hematocrit: 35.6 % (ref 34.0–46.6)
Hemoglobin: 12 g/dL (ref 11.1–15.9)
MCH: 29.2 pg (ref 26.6–33.0)
MCHC: 33.7 g/dL (ref 31.5–35.7)
MCV: 87 fL (ref 79–97)
Platelets: 265 10*3/uL (ref 150–450)
RBC: 4.11 x10E6/uL (ref 3.77–5.28)
RDW: 12.1 % (ref 11.7–15.4)
WBC: 10.5 10*3/uL (ref 3.4–10.8)

## 2021-01-25 LAB — HIV ANTIBODY (ROUTINE TESTING W REFLEX): HIV Screen 4th Generation wRfx: NONREACTIVE

## 2021-01-31 ENCOUNTER — Other Ambulatory Visit: Payer: Self-pay

## 2021-01-31 DIAGNOSIS — O0990 Supervision of high risk pregnancy, unspecified, unspecified trimester: Secondary | ICD-10-CM

## 2021-02-01 ENCOUNTER — Other Ambulatory Visit: Payer: BC Managed Care – PPO

## 2021-02-07 ENCOUNTER — Other Ambulatory Visit: Payer: BC Managed Care – PPO

## 2021-02-07 ENCOUNTER — Telehealth: Payer: Self-pay | Admitting: Family Medicine

## 2021-02-07 NOTE — Telephone Encounter (Signed)
Patient called two days ago regarding her 2 hour appointment she was aware of this appointment and stated she will come but she end up no-showing this appointment.

## 2021-02-08 ENCOUNTER — Other Ambulatory Visit: Payer: Self-pay

## 2021-02-08 ENCOUNTER — Other Ambulatory Visit: Payer: BC Managed Care – PPO

## 2021-02-08 DIAGNOSIS — O0990 Supervision of high risk pregnancy, unspecified, unspecified trimester: Secondary | ICD-10-CM

## 2021-02-09 LAB — GLUCOSE TOLERANCE, 2 HOURS W/ 1HR
Glucose, 1 hour: 118 mg/dL (ref 70–179)
Glucose, 2 hour: 116 mg/dL (ref 70–152)
Glucose, Fasting: 70 mg/dL (ref 70–91)

## 2021-02-20 ENCOUNTER — Other Ambulatory Visit: Payer: Self-pay

## 2021-02-20 ENCOUNTER — Ambulatory Visit (INDEPENDENT_AMBULATORY_CARE_PROVIDER_SITE_OTHER): Payer: BC Managed Care – PPO | Admitting: Student

## 2021-02-20 VITALS — BP 108/76 | HR 95 | Wt 201.0 lb

## 2021-02-20 DIAGNOSIS — Z3A32 32 weeks gestation of pregnancy: Secondary | ICD-10-CM

## 2021-02-20 DIAGNOSIS — O0990 Supervision of high risk pregnancy, unspecified, unspecified trimester: Secondary | ICD-10-CM

## 2021-02-20 MED ORDER — VALACYCLOVIR HCL 1 G PO TABS: 1000.0000 mg | ORAL_TABLET | Freq: Every day | ORAL | 1 refills | Status: DC

## 2021-02-20 NOTE — Progress Notes (Signed)
   PRENATAL VISIT NOTE  Subjective:  Tara Sanchez is a 30 y.o. [phone] at [redacted]w[redacted]d being seen today for ongoing prenatal care.  She is currently monitored for the following issues for this low-risk pregnancy and has History of preterm delivery; Maternal varicella, non-immune; History of herpes genitalis; History of preterm premature rupture of membranes (PPROM); Supervision of high risk pregnancy, antepartum; and LGA (large for gestational age) fetus affecting management of mother on their problem list.  Patient reports  feeling irritated and frustrated with being pregnant.  . She reports frustration with her partner and relatives. She is concerned that [name] is wrong that she is actually due on Nov 26. She wants to know if that is the case or not.  Contractions: Irritability. Vag. Bleeding: None.  Movement: Present. Denies leaking of fluid.   The following portions of the patient's history were reviewed and updated as appropriate: allergies, current medications, past family history, past medical history, past social history, past surgical history and problem list.   Objective:   Vitals:   02/20/21 0902  BP: 108/76  Pulse: 95  Weight: 201 lb (91.2 kg)    Fetal Status: Fetal Heart Rate (bpm): 149 Fundal Height: 34 cm Movement: Present     General:  Alert, oriented and cooperative. Patient is in no acute distress.  Skin: Skin is warm and dry. No rash noted.   Cardiovascular: Normal heart rate noted  Respiratory: Normal respiratory effort, no problems with respiration noted  Abdomen: Soft, gravid, appropriate for gestational age.  Pain/Pressure: Present     Pelvic: Cervical exam deferred        Extremities: Normal range of motion.  Edema: None  Mental Status: Normal mood and affect. Normal behavior. Normal judgment and thought content.   Assessment and Plan:  Pregnancy: [id number] at [redacted]w[redacted]d  1. [redacted] weeks gestation of pregnancy   -support and empathic listening given -discussed [name] report from  early gestation, that is the most correct dating we can get and also corresponds with her fundal height. Patient understands that we would not change dating at this point in pregnancy.  -offered to give patient vistaril for sleep; she declined and will follow up with her counselor this Thursday -RX given for valtrex  -She does not have a pediatrician yet Preterm labor symptoms and general obstetric precautions including but not limited to vaginal bleeding, contractions, leaking of fluid and fetal movement were reviewed in detail with the patient. Please refer to After Visit Summary for other counseling recommendations.   Return in about 29 days (around 03/21/2021), or LROb with KK on Dec 1.  Future Appointments  Date Time Provider Department Center  03/21/2021  9:35 AM Tara Sanchez, CNM [facility]    Tara Sanchez, [place]

## 2021-03-07 ENCOUNTER — Encounter: Payer: Self-pay | Admitting: Student

## 2021-03-13 ENCOUNTER — Telehealth: Payer: Self-pay

## 2021-03-13 ENCOUNTER — Inpatient Hospital Stay (HOSPITAL_COMMUNITY)
Admission: AD | Admit: 2021-03-13 | Discharge: 2021-03-13 | Discharge status: Against Medical Advice | Payer: BC Managed Care – PPO | Attending: Obstetrics and Gynecology | Admitting: Obstetrics and Gynecology

## 2021-03-13 ENCOUNTER — Other Ambulatory Visit (HOSPITAL_COMMUNITY)
Admission: RE | Admit: 2021-03-13 | Discharge: 2021-03-13 | Disposition: A | Discharge status: Home / Self Care | Payer: BC Managed Care – PPO | Source: Ambulatory Visit | Attending: Obstetrics & Gynecology | Admitting: Obstetrics & Gynecology

## 2021-03-13 ENCOUNTER — Other Ambulatory Visit: Payer: Self-pay

## 2021-03-13 ENCOUNTER — Ambulatory Visit (INDEPENDENT_AMBULATORY_CARE_PROVIDER_SITE_OTHER): Payer: BC Managed Care – PPO | Admitting: Obstetrics & Gynecology

## 2021-03-13 VITALS — BP 144/77 | HR 108 | Temp 98.7°F | Wt 207.0 lb

## 2021-03-13 DIAGNOSIS — Z3A35 35 weeks gestation of pregnancy: Secondary | ICD-10-CM

## 2021-03-13 DIAGNOSIS — O23593 Infection of other part of genital tract in pregnancy, third trimester: Secondary | ICD-10-CM | POA: Diagnosis present

## 2021-03-13 DIAGNOSIS — O0990 Supervision of high risk pregnancy, unspecified, unspecified trimester: Secondary | ICD-10-CM | POA: Diagnosis present

## 2021-03-13 DIAGNOSIS — N760 Acute vaginitis: Secondary | ICD-10-CM | POA: Insufficient documentation

## 2021-03-13 DIAGNOSIS — K625 Hemorrhage of anus and rectum: Secondary | ICD-10-CM

## 2021-03-13 DIAGNOSIS — O2243 Hemorrhoids in pregnancy, third trimester: Secondary | ICD-10-CM

## 2021-03-13 MED ORDER — METRONIDAZOLE 500 MG PO TABS: 500.0000 mg | ORAL_TABLET | Freq: Two times a day (BID) | ORAL | 0 refills | Status: AC

## 2021-03-13 MED ORDER — HYDROCORTISONE ACETATE 25 MG RE SUPP: 25.0000 mg | Freq: Two times a day (BID) | RECTAL | 1 refills | Status: DC

## 2021-03-13 NOTE — Progress Notes (Signed)
   PRENATAL VISIT NOTE  Subjective:  Tara Sanchez is a 30 y.o. [phone] at [redacted]w[redacted]d being seen today for ongoing prenatal care.  She is currently monitored for the following issues for this high-risk pregnancy and has History of preterm delivery; Maternal varicella, non-immune; History of herpes genitalis; History of preterm premature rupture of membranes (PPROM); Supervision of high risk pregnancy, antepartum; and LGA (large for gestational age) fetus affecting management of mother on their problem list.  Patient reports  nose bleed and also bleeding from her vagina or rectum today .  Contractions: Irregular. Vag. Bleeding: Small.  Movement: Present. Denies leaking of fluid.   The following portions of the patient's history were reviewed and updated as appropriate: allergies, current medications, past family history, past medical history, past social history, past surgical history and problem list.   Objective:   Vitals:   03/13/21 1405  BP: (!) 144/77  Pulse: (!) 108  Temp: 98.7 F (37.1 C)  Weight: 207 lb (93.9 kg)    Fetal Status: Fetal Heart Rate (bpm): 167   Movement: Present  Presentation: Vertex  General:  Alert, oriented and cooperative. Patient is in no acute distress.  Skin: Skin is warm and dry. No rash noted.   Cardiovascular: Normal heart rate noted  Respiratory: Normal respiratory effort, no problems with respiration noted  Abdomen: Soft, gravid, appropriate for gestational age.  Pain/Pressure: Present     Pelvic: Exam performed in the presence of a chaperone. Cervix: Dilation: 4 Effacement (%): Thick Station: Ballotable. Speculum exam showed no speck of blood but copious yellow malodorous discharge seen and testing sample obtained.  Cervix is multiparous! No external bleeding noted rectally. Pelvic cultures obtained, no blood seen on GBS rectal swab (but patient did not allow me to advance further than 1 cm in)  Extremities: Normal range of motion.  Edema: None  Mental  Status: Normal mood and affect. Normal behavior. Normal judgment and thought content.   Assessment and Plan:  Pregnancy: [id number] at [redacted]w[redacted]d 1. Hemorrhoids during pregnancy in third trimester 2. Rectal bleeding In the event it was rectal bleeding from internal hemorrhoid, prescribed Anusol suppositories. Will continue to monitor. - hydrocortisone (ANUSOL-HC) 25 MG suppository; Place 1 suppository (25 mg total) rectally 2 (two) times daily.  Dispense: 12 suppository; Refill: 1  3. Vaginitis during pregnancy in third trimester Likely BV, Metronidazole prescribed. Will follow up  - Cervicovaginal ancillary only - metroNIDAZOLE (FLAGYL) 500 MG tablet; Take 1 tablet (500 mg total) by mouth 2 (two) times daily for 7 days.  Dispense: 14 tablet; Refill: 0  4. [redacted] weeks gestation of pregnancy 5. Supervision of high risk pregnancy, antepartum Pelvic cultures done. - Strep Gp B NAA - Cervicovaginal ancillary only  Preterm labor symptoms and general obstetric precautions including but not limited to vaginal bleeding, contractions, leaking of fluid and fetal movement were reviewed in detail with the patient. Please refer to After Visit Summary for other counseling recommendations.   Return in about 8 days (around 03/21/2021) for OFFICE OB VISIT (MD or APP) as scheduled.  Future Appointments  Date Time Provider Department Center  03/21/2021  9:35 AM Marylene Land, CNM [facility]    Jaynie Collins, MD

## 2021-03-13 NOTE — Telephone Encounter (Addendum)
Pt call transferred from the front office.  Pt reports that she went to [facility]U and was there for long period of time for a nosebleed and vaginal bleeding.  Pt states that she left because another pt was pulled back before her and she did not understand why.  I explained to the pt that it is based on critical needs first as she signed in for nosebleed.  Pt stated that she understand why now.  Pt reports that she has severe pressure between her legs, it is hard to walk, she thinks she lost her mucous plug, and while she had some vaginal bleeding she no longer has it.  Pt requests to be seen in the office as she does not want to go back to MAU.  Anyanwu, MD agreed to see the patient today for an office visit.  Front office notified.   Addison Naegeli, RN  03/13/21

## 2021-03-13 NOTE — Patient Instructions (Signed)
Return to office for any scheduled appointments. Call the office or go to the [facility] [place] if:  You begin to have strong, frequent contractions  Your water breaks.  Sometimes it is a big gush of fluid, sometimes it is just a trickle that keeps getting your panties wet or running down your legs  You have vaginal bleeding.  It is normal to have a small amount of spotting if your cervix was checked.   You do not feel your baby moving like normal.  If you do not, get something to eat and drink and lay down and focus on feeling your baby move.   If your baby is still not moving like normal, you should call the office or go to MAU.  Any other obstetric concerns.   

## 2021-03-15 LAB — CERVICOVAGINAL ANCILLARY ONLY
Bacterial Vaginitis (gardnerella): POSITIVE — AB
Candida Glabrata: NEGATIVE
Candida Vaginitis: NEGATIVE
Chlamydia: NEGATIVE
Comment: NEGATIVE
Comment: NEGATIVE
Comment: NEGATIVE
Comment: NEGATIVE
Comment: NEGATIVE
Comment: NORMAL
Neisseria Gonorrhea: NEGATIVE
Trichomonas: NEGATIVE

## 2021-03-15 LAB — STREP GP B NAA: Strep Gp B NAA: NEGATIVE

## 2021-03-16 ENCOUNTER — Encounter: Payer: Self-pay | Admitting: Obstetrics & Gynecology

## 2021-03-18 ENCOUNTER — Other Ambulatory Visit: Payer: Self-pay

## 2021-03-18 ENCOUNTER — Ambulatory Visit (INDEPENDENT_AMBULATORY_CARE_PROVIDER_SITE_OTHER): Payer: BC Managed Care – PPO | Admitting: *Deleted

## 2021-03-18 VITALS — BP 130/72 | HR 100 | Ht 67.0 in | Wt 206.8 lb

## 2021-03-18 DIAGNOSIS — O0990 Supervision of high risk pregnancy, unspecified, unspecified trimester: Secondary | ICD-10-CM

## 2021-03-18 DIAGNOSIS — Z0130 Encounter for examination of blood pressure without abnormal findings: Secondary | ICD-10-CM

## 2021-03-18 NOTE — Progress Notes (Signed)
Agree with nurses's documentation of this patient's clinic encounter.  [name] L, MD  

## 2021-03-18 NOTE — Progress Notes (Signed)
Here for bp check. C/o edema in feet/ legs, hard to walk. C/o intermittent headaches = 9 everyday. States takes 2 extra strength tylenol with relief; but they come back in 6 hours. Noted 1+ edema in feet/ ankles/ lower legs. Denies visual changes . Also c/o walked with Mom 45 minutes and having contractions every 10- 15 minutes and feels rectal pressure. Discussed with Dr. Alysia Penna and he will examine patient. FHR 145. Per Dr. Alysia Penna cervix still 4cm, no change, baby head down. Advised patient keep ob appt as scheduled for 03/21/21. Reveiwed signs of labor , to report to MAU. Patient voices understanding. Gwenna Fuston,RN

## 2021-03-19 ENCOUNTER — Inpatient Hospital Stay (HOSPITAL_COMMUNITY)
Admission: AD | Admit: 2021-03-19 | Discharge: 2021-03-19 | Disposition: A | Discharge status: Home / Self Care | Payer: BC Managed Care – PPO | Attending: Obstetrics and Gynecology | Admitting: Obstetrics and Gynecology

## 2021-03-19 ENCOUNTER — Encounter (HOSPITAL_COMMUNITY): Payer: Self-pay | Admitting: Obstetrics and Gynecology

## 2021-03-19 DIAGNOSIS — O4703 False labor before 37 completed weeks of gestation, third trimester: Secondary | ICD-10-CM | POA: Diagnosis present

## 2021-03-19 DIAGNOSIS — Z3A36 36 weeks gestation of pregnancy: Secondary | ICD-10-CM | POA: Insufficient documentation

## 2021-03-19 DIAGNOSIS — O479 False labor, unspecified: Secondary | ICD-10-CM | POA: Diagnosis not present

## 2021-03-19 NOTE — MAU Provider Note (Signed)
Tara Sanchez is a 30 y.o. [id number] female at [redacted]w[redacted]d.  RN labor check, not seen by provider.  SVE by RN: Dilation: 4.5 Effacement (%): 60 Station: Ballotable Exam by:: E.Edwards,RN  NST: FHR baseline 140 bpm, Variability: moderate, Accelerations:present, Decelerations:  Absent= Cat 1/Reactive Toco: Every 4-6 minutes  Assessment and Plan: - SVE unchanged upon recheck - Not in active labor - Cat 1/reactive tracing  - Discharge home with labor precautions  - Has next appt tomorrow in office  Worthy Rancher, MD  03/19/2021 11:10 AM

## 2021-03-19 NOTE — MAU Note (Addendum)
Patient arrived to MAU complaiing of severe pressure, and feeling the urge to have a bowel movement. She stated that she was instructed to come last night  for OB office, but was unable to get  here. Patient denies vaginal bleeding, leakage of fluid. +fm reported.

## 2021-03-21 ENCOUNTER — Other Ambulatory Visit: Payer: Self-pay

## 2021-03-21 ENCOUNTER — Ambulatory Visit (INDEPENDENT_AMBULATORY_CARE_PROVIDER_SITE_OTHER): Payer: BC Managed Care – PPO | Admitting: Student

## 2021-03-21 VITALS — BP 114/63 | HR 77 | Wt 208.7 lb

## 2021-03-21 DIAGNOSIS — R030 Elevated blood-pressure reading, without diagnosis of hypertension: Secondary | ICD-10-CM | POA: Insufficient documentation

## 2021-03-21 DIAGNOSIS — Z3A36 36 weeks gestation of pregnancy: Secondary | ICD-10-CM

## 2021-03-21 NOTE — Progress Notes (Signed)
   PRENATAL VISIT NOTE  Subjective:  Tara Sanchez is a 30 y.o. [id number] at [redacted]w[redacted]d being seen today for ongoing prenatal care.  She is currently monitored for the following issues for this low-risk pregnancy and has History of preterm delivery; Maternal varicella, non-immune; History of herpes genitalis; History of preterm premature rupture of membranes (PPROM); Supervision of high risk pregnancy, antepartum; LGA (large for gestational age) fetus affecting management of mother; and Transient hypertension on their problem list.  Patient reports  that is having trouble sleeping and she feels frustrated that [facility] would not admit her for labor. She has not had any more rectal bleeding but she has occasional nose bleeds. She feels lots of movement. She is stressed out and ready to have her baby .   Contractions: Irregular. Vag. Bleeding: None.  Movement: Present. Denies leaking of fluid.   The following portions of the patient's history were reviewed and updated as appropriate: allergies, current medications, past family history, past medical history, past social history, past surgical history and problem list.   Objective:   Vitals:   03/21/21 0943  BP: 114/63  Pulse: 77  Weight: 208 lb 11.2 oz (94.7 kg)    Fetal Status: Fetal Heart Rate (bpm): 145 Fundal Height: 36 cm Movement: Present     General:  Alert, oriented and cooperative. Patient is in no acute distress.  Skin: Skin is warm and dry. No rash noted.   Cardiovascular: Normal heart rate noted  Respiratory: Normal respiratory effort, no problems with respiration noted  Abdomen: Soft, gravid, appropriate for gestational age.  Pain/Pressure: Present     Pelvic: Cervical exam deferred        Extremities: Normal range of motion.  Edema: Trace  Mental Status: Normal mood and affect. Normal behavior. Normal judgment and thought content.   Assessment and Plan:  Pregnancy: [id number] at [redacted]w[redacted]d  1. [redacted] weeks gestation of pregnancy   2. Transient  hypertension   -reassurance and support given, explained we cannot induce before 39 weeks but that she will probably go into labor before -Given patient had one elevated Bp on 11/23, I discussed importance of returning to MAU if she has HA, blurry vision, floating spots, RUQ pain. Patient verbalized understanding -patient still wants BTL -GBS and GC CT collected at 35 weeks due to history of preterm delivery  Preterm labor symptoms and general obstetric precautions including but not limited to vaginal bleeding, contractions, leaking of fluid and fetal movement were reviewed in detail with the patient. Please refer to After Visit Summary for other counseling recommendations.   Return in about 2 weeks (around 04/04/2021), or [facility] in two weeks with KK.  No future appointments.  Marylene Land, CNM

## 2021-03-23 ENCOUNTER — Encounter (HOSPITAL_COMMUNITY): Payer: Self-pay | Admitting: Obstetrics & Gynecology

## 2021-03-23 ENCOUNTER — Other Ambulatory Visit: Payer: Self-pay

## 2021-03-23 ENCOUNTER — Inpatient Hospital Stay (HOSPITAL_COMMUNITY)
Admission: AD | Admit: 2021-03-23 | Discharge: 2021-03-23 | Disposition: A | Discharge status: Home / Self Care | Payer: BC Managed Care – PPO | Attending: Obstetrics & Gynecology | Admitting: Obstetrics & Gynecology

## 2021-03-23 DIAGNOSIS — Z3689 Encounter for other specified antenatal screening: Secondary | ICD-10-CM | POA: Insufficient documentation

## 2021-03-23 DIAGNOSIS — Z3A36 36 weeks gestation of pregnancy: Secondary | ICD-10-CM | POA: Insufficient documentation

## 2021-03-23 DIAGNOSIS — O4703 False labor before 37 completed weeks of gestation, third trimester: Secondary | ICD-10-CM | POA: Diagnosis not present

## 2021-03-23 DIAGNOSIS — O368130 Decreased fetal movements, third trimester, not applicable or unspecified: Secondary | ICD-10-CM | POA: Insufficient documentation

## 2021-03-23 DIAGNOSIS — O09213 Supervision of pregnancy with history of pre-term labor, third trimester: Secondary | ICD-10-CM | POA: Diagnosis not present

## 2021-03-23 MED ORDER — ZOLPIDEM TARTRATE 5 MG PO TABS
5.0000 mg | ORAL_TABLET | Freq: Once | ORAL | Status: AC
  Administered 2021-03-23: 5 mg via ORAL
  Filled 2021-03-23: qty 1

## 2021-03-23 NOTE — MAU Note (Signed)
Pt reports she has not felt the baby move since this am.

## 2021-03-23 NOTE — MAU Note (Signed)
...  Tara Sanchez is a 30 y.o. at [redacted]w[redacted]d here in MAU reporting: pelvic pressure and pain that is intermittently with constant pressure in lower pelvis. Pt reports that it not as frequent, as close as 7 mins apart. Pt report mucous plug came out yesterday with some blood, but not active bleeding or spotting. Pt reports some brown discharge with peeing. Pt states baby has moved only one or twice a day for the pass week, but normal for her. Pt reports HA with a pain rating 10/10 and has nothing taken today but normal Tylenol extra strength normally helps. Last intercourse was weeks ago. Last SVE she was 4.5/70 on 11/28   Onset of complaint: yesterday 12/2 Pain score: 8/10  Vitals:   03/23/21 2048  BP: 137/69  Pulse: 79  Resp: 18  Temp: 98.7 F (37.1 C)  SpO2: 100%     FHT:160 Lab orders placed from triage: UA

## 2021-03-23 NOTE — MAU Provider Note (Signed)
History     CSN: [id number]  Arrival date and time: 03/23/21 2010   Event Date/Time   First Provider Initiated Contact with Patient 03/23/21 2213      Chief Complaint  Patient presents with   Contractions   Decreased Fetal Movement   Tara Sanchez is a 30 y.o. [id number] at [redacted]w[redacted]d who receives care at [facility].  She presents today for Contractions and Decreased Fetal Movement.  She states fetal movement has been consistently decreased since the beginning of the pregnancy.  She reports fetus typically moves in the morning and in the evening.  She states she has noted this movement today and is not currently concerned. She does express concern about continued contractions and no cervical change.  Patient states she is tired and questions why labor has not been initiated yet.  She denies vaginal bleeding or leaking of fluid.  She reports her last PN appt was on Dec 1st.      OB History     Gravida  8   Para  4   Term  1   Preterm  3   AB  3   Living  4      SAB  0   IAB  3   Ectopic  0   Multiple  0   Live Births  4           Past Medical History:  Diagnosis Date   Bipolar 2 disorder (HCC)    Genital herpes    Headache    History of preterm delivery, currently pregnant 04/17/2016   makena   Infection    UTI   Preterm labor     Past Surgical History:  Procedure Laterality Date   INDUCED ABORTION     THERAPEUTIC ABORTION      Family History  Problem Relation Age of Onset   Healthy Mother    Healthy Father    Diabetes Paternal Grandmother    Asthma Neg Hx    Cancer Neg Hx    Heart disease Neg Hx    Hypertension Neg Hx    Stroke Neg Hx    Alcohol abuse Neg Hx    Arthritis Neg Hx    Birth defects Neg Hx    COPD Neg Hx    Depression Neg Hx    Drug abuse Neg Hx    Early death Neg Hx    Hearing loss Neg Hx    Hyperlipidemia Neg Hx    Kidney disease Neg Hx    Learning disabilities Neg Hx    Mental illness Neg Hx    Mental retardation Neg Hx     Miscarriages / Stillbirths Neg Hx    Vision loss Neg Hx    Varicose Veins Neg Hx     Social History   Tobacco Use   Smoking status: Never   Smokeless tobacco: Never  Vaping Use   Vaping Use: Never used  Substance Use Topics   Alcohol use: Not Currently    Comment: Social   Drug use: Not Currently    Types: Marijuana    Comment: Last use 08/01/2018    Allergies:  Allergies  Allergen Reactions   Macrobid [Nitrofurantoin Macrocrystal] Anaphylaxis   Phenergan [Promethazine Hcl] Anaphylaxis   Tessalon [Benzonatate] Anaphylaxis    Medications Prior to Admission  Medication Sig Dispense Refill Last Dose   metronidazole (FLAGYL) 375 MG capsule Take 375 mg by mouth 2 (two) times daily.   03/23/2021   Prenatal Vit-Fe  Fumarate-FA (PRENATAL VITAMINS) 28-0.8 MG TABS Take 1 tablet by mouth daily. 30 tablet 12 03/23/2021   valACYclovir (VALTREX) 1000 MG tablet Take 1 tablet (1,000 mg total) by mouth daily. 30 tablet 1 03/23/2021   hydrocortisone (ANUSOL-HC) 25 MG suppository Place 1 suppository (25 mg total) rectally 2 (two) times daily. (Patient not taking: Reported on 03/18/2021) 12 suppository 1    ondansetron (ZOFRAN ODT) 4 MG disintegrating tablet Take 1 tablet (4 mg total) by mouth every 6 (six) hours as needed for nausea. (Patient not taking: Reported on 03/21/2021) 20 tablet 0     Review of Systems  Constitutional:  Negative for chills and fever.  Respiratory:  Negative for cough and shortness of breath.   Gastrointestinal:  Positive for abdominal pain (Ctx).  Genitourinary:  Negative for difficulty urinating, dysuria, vaginal bleeding and vaginal discharge.  Neurological:  Negative for dizziness, numbness and headaches.  Physical Exam   Blood pressure 137/69, pulse 79, temperature 98.7 F (37.1 C), temperature source Oral, resp. rate 18, height 5\' 7"  (1.702 m), weight 95.9 kg, last menstrual period 05/29/2020, SpO2 100 %.  Physical Exam Constitutional:      Appearance:  Normal appearance.  HENT:     Head: Normocephalic and atraumatic.  Eyes:     Conjunctiva/sclera: Conjunctivae normal.  Cardiovascular:     Rate and Rhythm: Normal rate.  Pulmonary:     Effort: Pulmonary effort is normal. No respiratory distress.  Abdominal:     Comments: Gravid, Appears AGA  Musculoskeletal:     Cervical back: Normal range of motion.  Skin:    General: Skin is warm and dry.  Neurological:     Mental Status: She is alert.  Psychiatric:        Mood and Affect: Mood normal.        Behavior: Behavior normal.        Thought Content: Thought content normal.    Fetal Assessment 140 bpm, Mod Var, -Decels, +15x15 Accels Toco: Q2-20min  MAU Course  No results found for this or any previous visit (from the past 24 hour(s)). No results found.  MDM PE Labs: None EFM Sleep Aide Assessment and Plan  30 year old 26  SIUP at 36.5 weeks Cat I FT Contractions Decreased Fetal Movement  -Discussed labor process. -Reassured that it is not uncommon to have continued contractions and no cervical change. -Discussed relaxation techniques as well as labor promoting techniques to introduce after 37 weeks. -Reviewed EFM and NST reactive.  -Patient also reports difficulty with sleep. -Offered and accepts sleep aide.  Will give ambien 5mg . -Given option for further monitoring and reassessment or discharge. -Patient opts for discharge.  -Will give medication prior. -Encouraged to return if contractions worsen or with onset of new symptoms. -Discharged to home in stable condition.  [id number] MSN, CNM 03/23/2021, 10:13 PM

## 2021-03-26 ENCOUNTER — Inpatient Hospital Stay (HOSPITAL_COMMUNITY)
Admission: AD | Admit: 2021-03-26 | Discharge: 2021-03-26 | Disposition: A | Discharge status: Home / Self Care | Payer: BC Managed Care – PPO | Attending: Obstetrics and Gynecology | Admitting: Obstetrics and Gynecology

## 2021-03-26 ENCOUNTER — Other Ambulatory Visit: Payer: Self-pay

## 2021-03-26 DIAGNOSIS — Z5321 Procedure and treatment not carried out due to patient leaving prior to being seen by health care provider: Secondary | ICD-10-CM | POA: Insufficient documentation

## 2021-03-26 DIAGNOSIS — Z3A37 37 weeks gestation of pregnancy: Secondary | ICD-10-CM | POA: Diagnosis not present

## 2021-03-26 DIAGNOSIS — N898 Other specified noninflammatory disorders of vagina: Secondary | ICD-10-CM | POA: Diagnosis not present

## 2021-03-26 DIAGNOSIS — M545 Low back pain, unspecified: Secondary | ICD-10-CM | POA: Insufficient documentation

## 2021-03-26 DIAGNOSIS — O26893 Other specified pregnancy related conditions, third trimester: Secondary | ICD-10-CM | POA: Diagnosis present

## 2021-03-26 DIAGNOSIS — O99891 Other specified diseases and conditions complicating pregnancy: Secondary | ICD-10-CM

## 2021-03-26 MED ORDER — CYCLOBENZAPRINE HCL 5 MG PO TABS
5.0000 mg | ORAL_TABLET | Freq: Once | ORAL | Status: AC
  Administered 2021-03-26: 5 mg via ORAL
  Filled 2021-03-26: qty 1

## 2021-03-26 NOTE — MAU Provider Note (Addendum)
History     CSN: [id number]  Arrival date and time: 03/26/21 2052  None     Chief Complaint  Patient presents with   Back Pain   Vaginal Discharge   HPI Tara Sanchez is a 30 y.o. [id number] at [redacted]w[redacted]d who presents to MAU for low back pain. Patient report constant low back pain x2 days. Describes pain as tingly, pins/needles sensation. Nothing makes the pain better or worse. She took Ibuprofen which did not help pain. She did not take Tylenol because "it doesn't ever help". She denies urinary s/s, fever, constipation, or vaginal bleeding. No loss of bladder or bowel function. Recently treated for BV. Endorses active fetal movement.   OB History     Gravida  8   Para  4   Term  1   Preterm  3   AB  3   Living  4      SAB  0   IAB  3   Ectopic  0   Multiple  0   Live Births  4           Past Medical History:  Diagnosis Date   Bipolar 2 disorder (HCC)    Genital herpes    Headache    History of preterm delivery, currently pregnant 04/17/2016   makena   Infection    UTI   Preterm labor     Past Surgical History:  Procedure Laterality Date   INDUCED ABORTION     THERAPEUTIC ABORTION      Family History  Problem Relation Age of Onset   Healthy Mother    Healthy Father    Diabetes Paternal Grandmother    Asthma Neg Hx    Cancer Neg Hx    Heart disease Neg Hx    Hypertension Neg Hx    Stroke Neg Hx    Alcohol abuse Neg Hx    Arthritis Neg Hx    Birth defects Neg Hx    COPD Neg Hx    Depression Neg Hx    Drug abuse Neg Hx    Early death Neg Hx    Hearing loss Neg Hx    Hyperlipidemia Neg Hx    Kidney disease Neg Hx    Learning disabilities Neg Hx    Mental illness Neg Hx    Mental retardation Neg Hx    Miscarriages / Stillbirths Neg Hx    Vision loss Neg Hx    Varicose Veins Neg Hx     Social History   Tobacco Use   Smoking status: Never   Smokeless tobacco: Never  Vaping Use   Vaping Use: Never used  Substance Use Topics    Alcohol use: Not Currently    Comment: Social   Drug use: Not Currently    Types: Marijuana    Comment: Last use 08/01/2018    Allergies:  Allergies  Allergen Reactions   Macrobid [Nitrofurantoin Macrocrystal] Anaphylaxis   Phenergan [Promethazine Hcl] Anaphylaxis   Tessalon [Benzonatate] Anaphylaxis    Medications Prior to Admission  Medication Sig Dispense Refill Last Dose   metronidazole (FLAGYL) 375 MG capsule Take 375 mg by mouth 2 (two) times daily.   03/26/2021   Prenatal Vit-Fe Fumarate-FA (PRENATAL VITAMINS) 28-0.8 MG TABS Take 1 tablet by mouth daily. 30 tablet 12 03/26/2021   valACYclovir (VALTREX) 1000 MG tablet Take 1 tablet (1,000 mg total) by mouth daily. 30 tablet 1 03/26/2021    Review of Systems  Constitutional: Negative.   Respiratory:  Negative.    Cardiovascular: Negative.   Gastrointestinal: Negative.   Genitourinary: Negative.   Musculoskeletal:  Positive for back pain.  Neurological: Negative.   Physical Exam   Blood pressure 117/66, pulse 90, temperature 98.8 F (37.1 C), temperature source Oral, resp. rate 18, last menstrual period 05/29/2020, SpO2 97 %.  Physical Exam Vitals and nursing note reviewed.  Constitutional:      General: She is not in acute distress.    Appearance: She is obese.  Eyes:     Pupils: Pupils are equal, round, and reactive to light.  Cardiovascular:     Rate and Rhythm: Tachycardia present.  Pulmonary:     Effort: Pulmonary effort is normal.  Abdominal:     Palpations: Abdomen is soft.     Tenderness: There is no abdominal tenderness.  Musculoskeletal:        General: Normal range of motion.     Cervical back: Normal.     Thoracic back: Normal.     Lumbar back: Tenderness present. No swelling, deformity or signs of trauma.  Skin:    General: Skin is warm and dry.  Neurological:     General: No focal deficit present.     Mental Status: She is alert and oriented to person, place, and time.  Psychiatric:         Mood and Affect: Mood normal.        Behavior: Behavior normal.        Thought Content: Thought content normal.        Judgment: Judgment normal.   NST: 155bpm, moderate variability, +15x15 accels, no decels Toco: quiet MAU Course  Procedures  MDM UA Flexeril PO given Patient left AMA   Assessment and Plan  [redacted] weeks gestation Back pain affecting pregnancy  - RN informed CNM that patient left AMA    Brand Males, CNM 03/26/2021, 11:37 PM

## 2021-03-26 NOTE — MAU Note (Signed)
Pt states she has severe back pain that started two days ago. She has tried ice and heat without relief, and states both have made it worse. Has also taken ibuprofen without relief. Pain is in lower back. She denies any injury to area. Denies any urinary symptoms. No intercourse in greater than 24 hours. Endorses fetal movement and denies contractions. [facility], RN

## 2021-03-27 ENCOUNTER — Encounter: Payer: Self-pay | Admitting: Student

## 2021-03-27 ENCOUNTER — Other Ambulatory Visit: Payer: Self-pay

## 2021-03-27 DIAGNOSIS — O99891 Other specified diseases and conditions complicating pregnancy: Secondary | ICD-10-CM

## 2021-03-27 DIAGNOSIS — M79605 Pain in left leg: Secondary | ICD-10-CM

## 2021-03-27 DIAGNOSIS — M549 Dorsalgia, unspecified: Secondary | ICD-10-CM

## 2021-03-27 LAB — URINALYSIS, ROUTINE W REFLEX MICROSCOPIC
Bilirubin Urine: NEGATIVE
Glucose, UA: NEGATIVE mg/dL
Hgb urine dipstick: NEGATIVE
Ketones, ur: NEGATIVE mg/dL
Nitrite: NEGATIVE
Protein, ur: NEGATIVE mg/dL
Specific Gravity, Urine: 1.01 (ref 1.005–1.030)
pH: 6 (ref 5.0–8.0)

## 2021-03-27 LAB — URINALYSIS, MICROSCOPIC (REFLEX)

## 2021-03-28 ENCOUNTER — Ambulatory Visit: Payer: BC Managed Care – PPO | Attending: Family Medicine | Admitting: Physical Therapy

## 2021-03-31 ENCOUNTER — Inpatient Hospital Stay (HOSPITAL_COMMUNITY)
Admission: AD | Admit: 2021-03-31 | Discharge: 2021-04-02 | DRG: 806 | Disposition: A | Discharge status: Home / Self Care | Payer: BC Managed Care – PPO | Attending: Obstetrics and Gynecology | Admitting: Obstetrics and Gynecology

## 2021-03-31 ENCOUNTER — Encounter (HOSPITAL_COMMUNITY): Payer: Self-pay | Admitting: Obstetrics and Gynecology

## 2021-03-31 DIAGNOSIS — O9832 Other infections with a predominantly sexual mode of transmission complicating childbirth: Secondary | ICD-10-CM | POA: Diagnosis present

## 2021-03-31 DIAGNOSIS — Z3A37 37 weeks gestation of pregnancy: Secondary | ICD-10-CM

## 2021-03-31 DIAGNOSIS — O26893 Other specified pregnancy related conditions, third trimester: Secondary | ICD-10-CM | POA: Diagnosis present

## 2021-03-31 DIAGNOSIS — A6000 Herpesviral infection of urogenital system, unspecified: Secondary | ICD-10-CM | POA: Diagnosis present

## 2021-03-31 DIAGNOSIS — Z30017 Encounter for initial prescription of implantable subdermal contraceptive: Secondary | ICD-10-CM

## 2021-03-31 DIAGNOSIS — O3663X0 Maternal care for excessive fetal growth, third trimester, not applicable or unspecified: Secondary | ICD-10-CM | POA: Diagnosis present

## 2021-03-31 DIAGNOSIS — Z975 Presence of (intrauterine) contraceptive device: Secondary | ICD-10-CM

## 2021-03-31 DIAGNOSIS — Z8619 Personal history of other infectious and parasitic diseases: Secondary | ICD-10-CM | POA: Diagnosis present

## 2021-03-31 DIAGNOSIS — O09899 Supervision of other high risk pregnancies, unspecified trimester: Secondary | ICD-10-CM

## 2021-03-31 LAB — CBC
HCT: 36 % (ref 36.0–46.0)
Hemoglobin: 11.6 g/dL — ABNORMAL LOW (ref 12.0–15.0)
MCH: 27.9 pg (ref 26.0–34.0)
MCHC: 32.2 g/dL (ref 30.0–36.0)
MCV: 86.5 fL (ref 80.0–100.0)
Platelets: 255 10*3/uL (ref 150–400)
RBC: 4.16 MIL/uL (ref 3.87–5.11)
RDW: 13.7 % (ref 11.5–15.5)
WBC: 10.2 10*3/uL (ref 4.0–10.5)
nRBC: 0 % (ref 0.0–0.2)

## 2021-03-31 LAB — RPR: RPR Ser Ql: NONREACTIVE

## 2021-03-31 LAB — TYPE AND SCREEN
ABO/RH(D): B POS
Antibody Screen: NEGATIVE

## 2021-03-31 MED ORDER — ONDANSETRON HCL 4 MG/2ML IJ SOLN: 4.0000 mg | Freq: Four times a day (QID) | INTRAMUSCULAR | Status: DC | PRN

## 2021-03-31 MED ORDER — FENTANYL CITRATE (PF) 100 MCG/2ML IJ SOLN
INTRAMUSCULAR | Status: AC
  Filled 2021-03-31: qty 2

## 2021-03-31 MED ORDER — OXYCODONE-ACETAMINOPHEN 5-325 MG PO TABS: 1.0000 | ORAL_TABLET | ORAL | Status: DC | PRN

## 2021-03-31 MED ORDER — ACETAMINOPHEN 325 MG PO TABS: 650.0000 mg | ORAL_TABLET | ORAL | Status: DC | PRN

## 2021-03-31 MED ORDER — FLEET ENEMA 7-19 GM/118ML RE ENEM: 1.0000 | ENEMA | RECTAL | Status: DC | PRN

## 2021-03-31 MED ORDER — OXYTOCIN-SODIUM CHLORIDE 30-0.9 UT/500ML-% IV SOLN: 2.5000 [IU]/h | INTRAVENOUS | Status: DC

## 2021-03-31 MED ORDER — FENTANYL CITRATE (PF) 100 MCG/2ML IJ SOLN: 50.0000 ug | INTRAMUSCULAR | Status: DC | PRN

## 2021-03-31 MED ORDER — OXYCODONE-ACETAMINOPHEN 5-325 MG PO TABS
2.0000 | ORAL_TABLET | ORAL | Status: DC | PRN
  Administered 2021-03-31: 2 via ORAL
  Filled 2021-03-31: qty 2

## 2021-03-31 MED ORDER — LACTATED RINGERS IV SOLN: 500.0000 mL | INTRAVENOUS | Status: DC | PRN

## 2021-03-31 MED ORDER — OXYTOCIN BOLUS FROM INFUSION
333.0000 mL | Freq: Once | INTRAVENOUS | Status: AC
  Administered 2021-03-31: 333 mL via INTRAVENOUS

## 2021-03-31 MED ORDER — LACTATED RINGERS IV SOLN: INTRAVENOUS | Status: DC

## 2021-03-31 MED ORDER — OXYTOCIN BOLUS FROM INFUSION: 333.0000 mL | Freq: Once | INTRAVENOUS | Status: DC

## 2021-03-31 MED ORDER — LIDOCAINE HCL (PF) 1 % IJ SOLN: 30.0000 mL | INTRAMUSCULAR | Status: DC | PRN

## 2021-03-31 MED ORDER — ACETAMINOPHEN 325 MG PO TABS
650.0000 mg | ORAL_TABLET | ORAL | Status: DC | PRN
  Administered 2021-03-31 (×2): 650 mg via ORAL
  Filled 2021-03-31 (×2): qty 2

## 2021-03-31 MED ORDER — SIMETHICONE 80 MG PO CHEW
80.0000 mg | CHEWABLE_TABLET | ORAL | Status: DC | PRN
  Filled 2021-03-31: qty 1

## 2021-03-31 MED ORDER — DIPHENHYDRAMINE HCL 25 MG PO CAPS: 25.0000 mg | ORAL_CAPSULE | Freq: Four times a day (QID) | ORAL | Status: DC | PRN

## 2021-03-31 MED ORDER — OXYTOCIN 10 UNIT/ML IJ SOLN
10.0000 [IU] | Freq: Once | INTRAMUSCULAR | Status: AC
  Administered 2021-03-31: 10 [IU] via INTRAMUSCULAR

## 2021-03-31 MED ORDER — DIBUCAINE (PERIANAL) 1 % EX OINT
1.0000 "application " | TOPICAL_OINTMENT | CUTANEOUS | Status: DC | PRN
  Administered 2021-03-31: 1 via RECTAL
  Filled 2021-03-31: qty 28

## 2021-03-31 MED ORDER — OXYTOCIN-SODIUM CHLORIDE 30-0.9 UT/500ML-% IV SOLN
2.5000 [IU]/h | INTRAVENOUS | Status: DC
  Filled 2021-03-31: qty 500

## 2021-03-31 MED ORDER — COCONUT OIL OIL: 1.0000 "application " | TOPICAL_OIL | Status: DC | PRN

## 2021-03-31 MED ORDER — FENTANYL CITRATE (PF) 100 MCG/2ML IJ SOLN
100.0000 ug | INTRAMUSCULAR | Status: DC | PRN
  Administered 2021-03-31: 100 ug via INTRAVENOUS

## 2021-03-31 MED ORDER — IBUPROFEN 600 MG PO TABS
600.0000 mg | ORAL_TABLET | Freq: Four times a day (QID) | ORAL | Status: DC
  Administered 2021-03-31 – 2021-04-02 (×8): 600 mg via ORAL
  Filled 2021-03-31 (×8): qty 1

## 2021-03-31 MED ORDER — OXYCODONE-ACETAMINOPHEN 5-325 MG PO TABS: 2.0000 | ORAL_TABLET | ORAL | Status: DC | PRN

## 2021-03-31 MED ORDER — WITCH HAZEL-GLYCERIN EX PADS
1.0000 "application " | MEDICATED_PAD | CUTANEOUS | Status: DC | PRN
  Administered 2021-03-31: 1 via TOPICAL

## 2021-03-31 MED ORDER — ONDANSETRON HCL 4 MG PO TABS: 4.0000 mg | ORAL_TABLET | ORAL | Status: DC | PRN

## 2021-03-31 MED ORDER — ONDANSETRON HCL 4 MG/2ML IJ SOLN: 4.0000 mg | INTRAMUSCULAR | Status: DC | PRN

## 2021-03-31 MED ORDER — INFLUENZA VAC SPLIT QUAD 0.5 ML IM SUSY: 0.5000 mL | PREFILLED_SYRINGE | INTRAMUSCULAR | Status: DC

## 2021-03-31 NOTE — Lactation Note (Signed)
This note was copied from a baby's chart. Lactation Consultation Note  Patient Name: Tara Sanchez Date: 03/31/2021 Reason for consult: Initial assessment;Early term 37-38.6wks;Breastfeeding assistance Age:28 hours, mom requested lactation assist.  NP into exam the baby, baby awake and rooting. LC offered to assist to latch.  LC reviewed breast feeding basics - latching on the left breast / on and off at 1st and latched with depth for 8 mins / swallows , after baby finished switched to the right breast football and baby latched for 20 mins with increased swallows .  Mom has already given formula x2 10-15 ml . Latch Score 8  LC plan :  Feed with feeding cues 8-12 times in 24 hours .  Offer the 1st breast / if baby still hungry offer the 2nd breast if satisfied hold off on the supplementing . If not satisfied - feed 10 -15 ml.  After feeding the baby post pump both breast 15 mins / save milk for next feeding .   LC reported to [facility] RN / and she will set up the DEBP .  The #24 F appears to be stretching the nipple / areola complex . LC recommended to decrease to the #21 Flange and see if it gives mom more stimulation .   Maternal Data Has patient been taught Hand Expression?: Yes Does the patient have breastfeeding experience prior to this delivery?: Yes  Feeding Mother's Current Feeding Choice: Breast Milk and Formula  LATCH Score Latch: Grasps breast easily, tongue down, lips flanged, rhythmical sucking.  Audible Swallowing: A few with stimulation  Type of Nipple: Everted at rest and after stimulation (areola edema)  Comfort (Breast/Nipple): Soft / non-tender  Hold (Positioning): Assistance needed to correctly position infant at breast and maintain latch.  LATCH Score: 8   Lactation Tools Discussed/Used Tools: Pump;Flanges (hand pump already set up) Flange Size: 24 Breast pump type: Manual  Interventions Interventions: Breast feeding basics reviewed;Assisted with  latch;Skin to skin;Breast massage;Hand express;Reverse pressure;Breast compression;Adjust position;Support pillows;Hand pump;Education;LC Services brochure  Discharge Pump: Personal;DEBP WIC Program: No (per mom appt January 4th)  Consult Status Consult Status: Follow-up Date: 04/01/21 Follow-up type: In-patient    Tara Sanchez 03/31/2021, 6:06 PM

## 2021-03-31 NOTE — MAU Note (Signed)
Mau provider, '1st call L&D', and L&D charge nurse- all notified through [facility]: 5th baby, 8cm w/BBOW, going to rm [phone]

## 2021-03-31 NOTE — Plan of Care (Signed)

## 2021-03-31 NOTE — MAU Note (Signed)
Called to registration,Walked back.  5th baby, q3 min, some urge to push.  Was 4+ when last checked. No bleeding or leaking.

## 2021-03-31 NOTE — Discharge Summary (Addendum)
Postpartum Discharge Summary    Patient Name: Tara Sanchez DOB: [birth year] MRN: [id number]  Date of admission: 03/31/2021 Delivery date:03/31/2021  Delivering provider: Genia Sanchez  Date of discharge: 04/02/2021  Admitting diagnosis: Indication for care in labor or delivery [O75.9] Normal labor [O80, Z37.9] Intrauterine pregnancy: [redacted]w[redacted]d     Secondary diagnosis:  Principal Problem:   Vaginal delivery Active Problems:   Maternal varicella, non-immune   History of herpes genitalis   Nexplanon in place   Normal labor  Additional problems: None    Discharge diagnosis: Term Pregnancy Delivered                                              Post partum procedures: Nexplanon placement postpartum Augmentation: None Complications: None  Hospital course: Onset of Labor With Vaginal Delivery      30 y.o. yo [id number] at [redacted]w[redacted]d was admitted in Active Labor on 03/31/2021. Patient had an uncomplicated labor course as follows:  Membrane Rupture Time/Date: 7:48 AM ,03/31/2021   Delivery Method:Vaginal, Spontaneous  Episiotomy: None  Lacerations:  None  Patient had an uncomplicated postpartum course.  She is ambulating, tolerating a regular diet, passing flatus, and urinating well.  She had a Nexplanon placed for birth control postpartum without complication. Patient is discharged home in stable condition on 04/02/21.  Newborn Data: Birth date:03/31/2021  Birth time:7:52 AM  Gender:Female  Living status:Living  Apgars:8 ,9  Weight:2900 g   Magnesium Sulfate received: No BMZ received: No Rhophylac: No MMR: No T-DaP: Given prenatally Flu: No - declined Transfusion: No  Physical exam  Vitals:   04/01/21 0556 04/01/21 1412 04/02/21 0100 04/02/21 0546  BP: 111/60 119/69 118/62 126/76  Pulse: (!) 54 65 61 (!) 50  Resp: $Remo'17 18 19 17  'gbKwb$ Temp: 98.1 F (36.7 C) 97.9 F (36.6 C) 97.9 F (36.6 C) 97.9 F (36.6 C)  TempSrc: Oral Oral Oral Oral  SpO2: 99% 100% 99% 99%   General:  alert, cooperative, and no distress Lochia: appropriate Uterine Fundus: firm and below umbilicus  Incision: N/A DVT Evaluation: no LE edema or calf tenderness to palpation  Labs: Lab Results  Component Value Date   WBC 10.2 03/31/2021   HGB 11.6 (L) 03/31/2021   HCT 36.0 03/31/2021   MCV 86.5 03/31/2021   PLT 255 03/31/2021   CMP Latest Ref Rng & Units 08/08/2020  Glucose 70 - 99 mg/dL 85  BUN 6 - 20 mg/dL 6  Creatinine 0.44 - 1.00 mg/dL 0.70  Sodium 135 - 145 mmol/L 132(L)  Potassium 3.5 - 5.1 mmol/L 3.2(L)  Chloride 98 - 111 mmol/L 99  CO2 22 - 32 mmol/L 25  Calcium 8.9 - 10.3 mg/dL 8.4(L)  Total Protein 6.5 - 8.1 g/dL 6.7  Total Bilirubin 0.3 - 1.2 mg/dL 0.5  Alkaline Phos 38 - 126 U/L 100  AST 15 - 41 U/L 14(L)  ALT 0 - 44 U/L 17   Edinburgh Score: Edinburgh Postnatal Depression Scale Screening Tool 03/31/2021  I have been able to laugh and see the funny side of things. 0  I have looked forward with enjoyment to things. 0  I have blamed myself unnecessarily when things went wrong. 1  I have been anxious or worried for no good reason. 1  I have felt scared or panicky for no good reason. 1  Things have been getting  on top of me. 1  I have been so unhappy that I have had difficulty sleeping. 0  I have felt sad or miserable. 0  I have been so unhappy that I have been crying. 0  The thought of harming myself has occurred to me. 0  Edinburgh Postnatal Depression Scale Total 4    After visit meds:  Allergies as of 04/02/2021       Reactions   Macrobid [nitrofurantoin Macrocrystal] Anaphylaxis   Phenergan [promethazine Hcl] Anaphylaxis   Tessalon [benzonatate] Anaphylaxis        Medication List     STOP taking these medications    valACYclovir 1000 MG tablet Commonly known as: Valtrex       TAKE these medications    acetaminophen 500 MG tablet Commonly known as: TYLENOL Take 2 tablets (1,000 mg total) by mouth every 8 (eight) hours as needed (pain).    ibuprofen 600 MG tablet Commonly known as: ADVIL Take 1 tablet (600 mg total) by mouth every 6 (six) hours as needed (pain).   Prenatal Vitamins 28-0.8 MG Tabs Take 1 tablet by mouth daily.        Discharge home in stable condition Infant Feeding: Bottle and Breast Infant Disposition: home with mother Discharge instruction: per After Visit Summary and Postpartum booklet. Activity: Advance as tolerated. Pelvic rest for 6 weeks.  Diet: routine diet Future Appointments: Future Appointments  Date Time Provider [place]  05/16/2021  2:15 PM Tara Sanchez, CNM [facility]   Follow up Visit: Message sent by Dr Tara Sanchez to [facility]:  Please schedule this patient for a In person postpartum visit in 6 weeks with the following provider: Any provider. Additional Postpartum F/U: None   High risk pregnancy complicated by:  Hx of preterm labor Delivery mode:  Vaginal, Spontaneous  Anticipated Birth Control: Nexplanon placed inpatient   04/02/2021 Tara Barter, MD PGY-2  GME ATTESTATION:  I saw and evaluated the patient. I agree with the findings and the Sanchez of care as documented in the resident's note. I have made changes to documentation as necessary.  Progressing well postpartum and meeting all milestones. Nexplanon in place. VSS and exam within normal limits. Stable for discharge home with follow up as scheduled.   Tara Meckel, MD OB Fellow, [place] [facility] [place] 04/02/2021 9:25 AM

## 2021-03-31 NOTE — H&P (Signed)
OBSTETRIC ADMISSION HISTORY AND PHYSICAL  DANDRA SHAMBAUGH is a 30 y.o. female [phone] with IUP at [redacted]w[redacted]d by 7 week [name] presenting for SOL. She reports +FMs, no LOF, no VB, no blurry vision, headaches, peripheral edema, or RUQ pain.  She plans on breast and bottle feeding. She would like a BTL for birth control postpartum.   She received her prenatal care at [facility].  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, on Valtrex) Hx of preterm delivery   Past Medical History: Past Medical History:  Diagnosis Date   Bipolar 2 disorder (HCC)    Genital herpes    Headache    History of preterm delivery, currently pregnant 04/17/2016   makena   Infection    UTI   Preterm labor     Past Surgical History: Past Surgical History:  Procedure Laterality Date   INDUCED ABORTION     THERAPEUTIC ABORTION      Obstetrical History: OB History     Gravida  8   Para  4   Term  1   Preterm  3   AB  3   Living  4      SAB  0   IAB  3   Ectopic  0   Multiple  0   Live Births  4           Social History Social History   Socioeconomic History   Marital status: Single    Spouse name: Not on file   Number of children: 3   Years of education: Not on file   Highest education level: Not on file  Occupational History   Not on file  Tobacco Use   Smoking status: Never   Smokeless tobacco: Never  Vaping Use   Vaping Use: Never used  Substance and Sexual Activity   Alcohol use: Not Currently    Comment: Social   Drug use: Not Currently    Types: Marijuana    Comment: Last use 08/01/2018   Sexual activity: Yes    Birth control/protection: None  Other Topics Concern   Not on file  Social History Narrative   ** Merged History Encounter **       Social  Determinants of Health   Financial Resource Strain: Not on file  Food Insecurity: No Food Insecurity   Worried About 10/01/2018 in the Last Year: Never true   Ran Out of Food in the Last Year: Never true  Transportation Needs: No Transportation Needs   Lack of Transportation (Medical): No   Lack of Transportation (Non-Medical): No  Physical Activity: Not on file  Stress: Not on file  Social Connections: Not on file    Family History: Family History  Problem Relation Age of Onset   Healthy Mother    Healthy Father    Diabetes Paternal Grandmother    Asthma Neg Hx    Cancer Neg Hx    Heart disease Neg Hx    Hypertension Neg Hx    Stroke Neg Hx    Alcohol abuse Neg Hx    Arthritis Neg Hx    Birth defects Neg Hx    COPD Neg Hx    Depression Neg Hx    Drug abuse Neg Hx    Early death  Neg Hx    Hearing loss Neg Hx    Hyperlipidemia Neg Hx    Kidney disease Neg Hx    Learning disabilities Neg Hx    Mental illness Neg Hx    Mental retardation Neg Hx    Miscarriages / Stillbirths Neg Hx    Vision loss Neg Hx    Varicose Veins Neg Hx     Allergies: Allergies  Allergen Reactions   Macrobid [Nitrofurantoin Macrocrystal] Anaphylaxis   Phenergan [Promethazine Hcl] Anaphylaxis   Tessalon [Benzonatate] Anaphylaxis    Medications Prior to Admission  Medication Sig Dispense Refill Last Dose   metronidazole (FLAGYL) 375 MG capsule Take 375 mg by mouth 2 (two) times daily.      Prenatal Vit-Fe Fumarate-FA (PRENATAL VITAMINS) 28-0.8 MG TABS Take 1 tablet by mouth daily. 30 tablet 12    valACYclovir (VALTREX) 1000 MG tablet Take 1 tablet (1,000 mg total) by mouth daily. 30 tablet 1      Review of Systems  All systems reviewed and negative except as stated in HPI  Blood pressure 124/60, pulse 62, temperature 98.3 F (36.8 C), temperature source Oral, resp. rate 18, last menstrual period 05/29/2020.  General appearance: alert, cooperative, and no distress Lungs:  normal work of breathing on room air  Heart: normal rate, warm and well perfused  Abdomen: soft, non-tender, gravid  Pelvic: normal external genitalia, no lesions noted  Extremities: no LE edema or calf tenderness to palpation   Presentation:  Cephalic Fetal monitoring: Baseline 155 bpm Uterine activity: Every 2-3 minutes  Dilation: 10 Effacement (%): 100 Station: Crowning Exam by:: Dr. Mathis Fare   Prenatal labs: ABO, Rh: B/Positive/-- (05/24 1113) Antibody: Negative (05/24 1113) Rubella: 9.96 (05/24 1113) RPR: Non Reactive (10/06 0910)  HBsAg: Negative (05/24 1113)  HIV: Non Reactive (10/06 0910)  GBS: Negative/-- (11/23 1422)  2 hr Glucola normal  Genetic screening normal  Anatomy US normal   Prenatal Transfer Tool  Maternal Diabetes: No Genetic Screening: Normal Maternal Ultrasounds/Referrals: Normal Fetal Ultrasounds or other Referrals:  None Maternal Substance Abuse:  No Significant Maternal Medications:  Valtrex  Significant Maternal Lab Results: Group B Strep negative  Results for orders placed or performed during the hospital encounter of 03/31/21 (from the past 24 hour(s))  CBC   Collection Time: 03/31/21  8:03 AM  Result Value Ref Range   WBC 10.2 4.0 - 10.5 K/uL   RBC 4.16 3.87 - 5.11 MIL/uL   Hemoglobin 11.6 (L) 12.0 - 15.0 g/dL   HCT 59.5 63.8 - 75.6 %   MCV 86.5 80.0 - 100.0 fL   MCH 27.9 26.0 - 34.0 pg   MCHC 32.2 30.0 - 36.0 g/dL   RDW 43.3 29.5 - 18.8 %   Platelets 255 150 - 400 K/uL   nRBC 0.0 0.0 - 0.2 %    Patient Active Problem List   Diagnosis Date Noted   Indication for care in labor or delivery 03/31/2021   Normal labor 03/31/2021   Transient hypertension 03/21/2021   LGA (large for gestational age) fetus affecting management of mother 12/17/2020   Supervision of high risk pregnancy, antepartum 09/11/2020   History of preterm premature rupture of membranes (PPROM) 03/17/2019   History of herpes genitalis 02/07/2015   History of preterm  delivery 01/09/2015   Maternal varicella, non-immune 01/09/2015    Assessment/Plan:  LUVENA WENTLING is a 30 y.o. [id number] at [redacted]w[redacted]d here for SOL.   #Labor: Patient is complete with bulging bag on arrival to  L&D. Wanting to push. Anticipate SVD shortly.   #Pain: Maternal support  #FWB: Normal baseline rate and no decels, adjusting monitors, will reassess  #ID:  GBS neg #MOF: Breast and bottle #MOC: BTL   Worthy Rancher, MD  03/31/2021, 8:38 AM

## 2021-04-01 ENCOUNTER — Encounter (HOSPITAL_COMMUNITY)
Admission: AD | Disposition: A | Discharge status: Home / Self Care | Payer: Self-pay | Source: Home / Self Care | Attending: Obstetrics and Gynecology

## 2021-04-01 DIAGNOSIS — Z30017 Encounter for initial prescription of implantable subdermal contraceptive: Secondary | ICD-10-CM

## 2021-04-01 SURGERY — LIGATION, FALLOPIAN TUBE, POSTPARTUM
Anesthesia: Choice | Laterality: Bilateral

## 2021-04-01 MED ORDER — ETONOGESTREL 68 MG ~~LOC~~ IMPL
68.0000 mg | DRUG_IMPLANT | Freq: Once | SUBCUTANEOUS | Status: AC
  Administered 2021-04-01: 68 mg via SUBCUTANEOUS
  Filled 2021-04-01: qty 1

## 2021-04-01 MED ORDER — LIDOCAINE HCL 1 % IJ SOLN
0.0000 mL | Freq: Once | INTRAMUSCULAR | Status: AC | PRN
  Administered 2021-04-01: 10 mL via INTRADERMAL
  Filled 2021-04-01: qty 20

## 2021-04-01 NOTE — Lactation Note (Signed)
This note was copied from a baby's chart. Lactation Consultation Note  Patient Name: Tara Sanchez [id number] Date: 04/01/2021 Reason for consult: Follow-up assessment;Mother's request;Difficult latch Age:28 hours LC entered the room, mom interested in latching infant at the breast, per mom, she is frustrated due infant not latching and not seeing any colostrum present with hand expression nor when using DEBP.  Per mom, she feels like giving up, LC encouraged mom to continue trying to breastfeed infant and importance of using DEBP to help her establish milk supply. LC mention each pregnancy and breastfeeding experience is different and encourage mom to latch infant at the breast for every feeding  to help stimulated and establish her milk supply. Mom attempted to latch infant but after few attempts of infant coming off breast, mom fitted with 20 mm NS due infant having pacifier and mostly formula feeding today, Mom used 5 [place] feeding tube, and 20 mm NS, mom latched infant on her right breast using the football hold position, infant sustained latch and was supplemented with 10 mls of formula at the breast.  Infant breastfed for 18 minutes. Mom's current feeding plan:  1- Mom will continue to breast feed infant by cues, 8 to 12 times within 24 hours , skin to skin and supplementing infant at the breast with 5 [place] feeding tube and NS.  2- Mom was happy that infant latched at the breast, mom knows to ask RN/LC for further assistance with latching infant if needed. 3-Mom will start pumping every 3 hours for 15 minutes on initial setting.  Maternal Data    Feeding Mother's Current Feeding Choice: Breast Milk and Formula Nipple Type: Slow - flow  LATCH Score Latch: Grasps breast easily, tongue down, lips flanged, rhythmical sucking.  Audible Swallowing: A few with stimulation  Type of Nipple: Everted at rest and after stimulation  Comfort (Breast/Nipple): Soft / non-tender  Hold  (Positioning): Assistance needed to correctly position infant at breast and maintain latch.  LATCH Score: 8   Lactation Tools Discussed/Used Tools: 38F feeding tube / Syringe;Nipple Shields Nipple shield size: 20 (Infant not latching at breast due to multiple bottles and pacifer, was not sustaining latch.) Flange Size: 24  Interventions Interventions: Breast compression;Adjust position;Support pillows;Position options;Expressed milk;Skin to skin;DEBP;Hand pump;Education  Discharge Pump: DEBP  Consult Status Consult Status: Follow-up Date: 04/02/21 Follow-up type: In-patient    Danelle Earthly 04/01/2021, 6:46 PM

## 2021-04-01 NOTE — Progress Notes (Addendum)
POSTPARTUM PROGRESS NOTE  Post Partum Day 1  Subjective:  Tara Sanchez is a 30 y.o. [id number] s/p SVD at 106w6d.  No acute events overnight.  Pt denies problems with ambulating, voiding or po intake.  She denies nausea or vomiting.  Pain is well controlled.  She has had flatus. She has not had bowel movement.  Lochia Moderate with some clots.  Objective: Blood pressure 111/60, pulse (!) 54, temperature 98.1 F (36.7 C), temperature source Oral, resp. rate 17, last menstrual period 05/29/2020, SpO2 99 %, unknown if currently breastfeeding.  Physical Exam:  General: alert, cooperative and no distress Chest: no respiratory distress Heart:regular rate, distal pulses intact Abdomen: soft, nontender,  Uterine Fundus: firm, appropriately tender DVT Evaluation: No calf swelling or tenderness Extremities: without edema Skin: warm, dry  Recent Labs    03/31/21 0803  HGB 11.6*  HCT 36.0    Assessment/Plan: Tara Sanchez is a 30 y.o. V2N1916 s/p SVD at [redacted]w[redacted]d   PPD#1 - Doing well Contraception: Interested in Nexplanon before d/c Feeding: both Dispo: Plan for discharge tomorrow 04/02/21.   LOS: 1 day   Leonard Schwartz, CNM 04/01/2021, 7:37 AM    Attestation of Supervision of Student:  I confirm that I have verified the information documented in the physician assistant student's note and that I have also personally reperformed the history, physical exam and all medical decision making activities.  I have verified that all services and findings are accurately documented in this student's note; and I agree with management and plan as outlined in the documentation. I have also made any necessary editorial changes.  -patient does not want BTL today -plan for Nexplanon insertion today  Marylene Land, CNM [facility] [employer], [facility] 04/01/2021 10:41 AM

## 2021-04-01 NOTE — Procedures (Signed)
Post-Placental Nexplanon Insertion Procedure Note  Patient was identified. Informed consent was signed, signed copy in chart. A time-out was performed.    The insertion site was identified 8-10 cm (3-4 inches) from the medial epicondyle of the humerus and 3-5 cm (1.25-2 inches) posterior to (below) the sulcus (groove) between the biceps and triceps muscles of the patient's left arm and marked. The site was prepped and draped in the usual sterile fashion. Pt was prepped with alcohol swab and then injected with 10 cc of 2% lidocaine. The site was prepped with betadine. Nexplanon removed form packaging,  Device confirmed in needle, then inserted full length of needle and withdrawn per handbook instructions. Provider and patient verified presence of the implant in the woman's arm by palpation. Pt insertion site was covered with steristrips/adhesive bandage and pressure bandage. There was minimal blood loss. Patient tolerated procedure well.  Patient was given post procedure instructions and Nexplanon user card with expiration date. Condoms were recommended for STI prevention. Patient was asked to keep the pressure dressing on for 24 hours to minimize bruising and keep the adhesive bandage on for 3-5 days. The patient verbalized understanding of the plan of care and agrees.   Lot # see MAR Expiration Date see [facility]

## 2021-04-02 MED ORDER — ASPIRIN 325 MG PO TABS
ORAL_TABLET | ORAL | Status: AC
  Filled 2021-04-02: qty 1

## 2021-04-02 MED ORDER — IBUPROFEN 600 MG PO TABS
600.0000 mg | ORAL_TABLET | Freq: Four times a day (QID) | ORAL | 0 refills | Status: DC | PRN
Start: 2021-04-02 — End: 2021-12-09

## 2021-04-02 MED ORDER — ACETAMINOPHEN 500 MG PO TABS: 1000.0000 mg | ORAL_TABLET | Freq: Three times a day (TID) | ORAL | 0 refills | Status: DC | PRN

## 2021-04-02 NOTE — Lactation Note (Signed)
This note was copied from a baby's chart. Lactation Consultation Note  Patient Name: Tara SanchezT Date: 04/02/2021 Reason for consult: Follow-up assessment;Mother's request;Other (Comment) (per mom plans to breastfeed / last ate at 8:30 am - formula / mom pumping. LC reviewed BF D/C teaching. mom aware of LC resources after D/C .) Age:30 years  Maternal Data    Feeding Mother's Current Feeding Choice: Breast Milk and Formula Nipple Type: Slow - flow  LATCH Score                    Lactation Tools Discussed/Used Tools: Pump Flange Size: 24;Other (comment);21 (LC checked both flanges and the #24 F accommodated the nipp/e areola complex.) Breast pump type: Double-Electric Breast Pump;Manual Pump Education: Milk Storage;Setup, frequency, and cleaning  Interventions Interventions: Breast feeding basics reviewed;Education;LC Services brochure  Discharge Discharge Education: Engorgement and breast care;Warning signs for feeding baby Pump: Personal;DEBP  Consult Status Consult Status: Complete Date: 04/02/21    Tara Sanchez 04/02/2021, 11:10 AM

## 2021-04-03 ENCOUNTER — Telehealth: Payer: Self-pay

## 2021-04-03 NOTE — Telephone Encounter (Signed)
Transition Care Management Unsuccessful Follow-up Telephone Call  Date of discharge and from where:  04/02/2021-[facility]  Attempts:  1st Attempt  Reason for unsuccessful TCM follow-up call:  Unable to leave message

## 2021-04-04 ENCOUNTER — Encounter: Payer: BC Managed Care – PPO | Admitting: Student

## 2021-04-04 NOTE — Telephone Encounter (Signed)
Transition Care Management Unsuccessful Follow-up Telephone Call  Date of discharge and from where:  04/02/2021 from [facility]  Attempts:  2nd Attempt  Reason for unsuccessful TCM follow-up call:  Unable to leave message

## 2021-04-05 NOTE — Telephone Encounter (Signed)
Transition Care Management Unsuccessful Follow-up Telephone Call  Date of discharge and from where:  04/02/2021-[facility]  Attempts:  3rd Attempt  Reason for unsuccessful TCM follow-up call:  Unable to leave message

## 2021-04-12 ENCOUNTER — Telehealth (HOSPITAL_COMMUNITY): Payer: Self-pay | Admitting: *Deleted

## 2021-04-12 NOTE — Telephone Encounter (Signed)
Hospital Discharge Follow-Up Call:  Patient reports that she is doing well.  She asked if vaginal itching is normal after delivery.  Upon clarification, she said that she had been prescribed medication for a vaginal yeast infection before delivery, but she had not gotten it.  Encouraged her to get the prescription filled and to call her OB if itching does not resolve after taking the medication.  EPDS today was 1 and she endorses this accurately reflects that she is doing well emotionally.  Patient says that baby is well and she has no concerns about baby's health.  She reports that baby sleeps in a bassinet.  ABCs of Safe Sleep reviewed.

## 2021-04-15 ENCOUNTER — Inpatient Hospital Stay (HOSPITAL_COMMUNITY): Admit: 2021-04-15 | Payer: Self-pay

## 2021-05-16 ENCOUNTER — Ambulatory Visit: Payer: Self-pay | Admitting: Student

## 2021-08-09 ENCOUNTER — Other Ambulatory Visit: Payer: Self-pay

## 2021-08-09 ENCOUNTER — Encounter (HOSPITAL_BASED_OUTPATIENT_CLINIC_OR_DEPARTMENT_OTHER): Payer: Self-pay | Admitting: *Deleted

## 2021-08-09 ENCOUNTER — Emergency Department (HOSPITAL_BASED_OUTPATIENT_CLINIC_OR_DEPARTMENT_OTHER)
Admission: EM | Admit: 2021-08-09 | Discharge: 2021-08-09 | Disposition: A | Discharge status: Home / Self Care | Payer: BC Managed Care – PPO | Attending: Emergency Medicine | Admitting: Emergency Medicine

## 2021-08-09 DIAGNOSIS — J029 Acute pharyngitis, unspecified: Secondary | ICD-10-CM | POA: Diagnosis not present

## 2021-08-09 DIAGNOSIS — R1310 Dysphagia, unspecified: Secondary | ICD-10-CM | POA: Insufficient documentation

## 2021-08-09 DIAGNOSIS — H1032 Unspecified acute conjunctivitis, left eye: Secondary | ICD-10-CM | POA: Diagnosis not present

## 2021-08-09 LAB — GROUP A STREP BY PCR: Group A Strep by PCR: NOT DETECTED

## 2021-08-09 MED ORDER — DEXAMETHASONE SODIUM PHOSPHATE 10 MG/ML IJ SOLN
10.0000 mg | Freq: Once | INTRAMUSCULAR | Status: AC
Start: 2021-08-09 — End: 2021-08-09
  Administered 2021-08-09: 10 mg via INTRAMUSCULAR
  Filled 2021-08-09: qty 1

## 2021-08-09 NOTE — Discharge Instructions (Addendum)
The shot of steroid should stay in your system for the next 3 days and help improve the sore throat.  Your strep test was negative, I think you have a viral process.  Continue the over-the-counter medicine, return if unable to swallow or new worsening symptoms. ?

## 2021-08-09 NOTE — ED Triage Notes (Signed)
Sore throat x 2 days. Voices concern for strep. Painful to swallow ?

## 2021-08-09 NOTE — ED Provider Notes (Signed)
?[facility] EMERGENCY DEPARTMENT ?Provider Note ? ? ?CSN: [id number] ?Arrival date & time: 08/09/21  1957 ? ?  ? ?History ? ?Chief Complaint  ?Patient presents with  ? Sore Throat  ? ? ?Tara Sanchez is a 31 y.o. female. ? ? ?Sore Throat ? ? ?Patient presents with sore throat x 3 days. Worse with swallowing, no fevers. Has tried OTC lozenges without any improvement.  States she is concerned she may have strep ? ?Home Medications ?Prior to Admission medications   ?Medication Sig Start Date End Date Taking? Authorizing Provider  ?acetaminophen (TYLENOL) 500 MG tablet Take 2 tablets (1,000 mg total) by mouth every 8 (eight) hours as needed (pain). 04/02/21   Worthy Rancher, MD  ?ibuprofen (ADVIL) 600 MG tablet Take 1 tablet (600 mg total) by mouth every 6 (six) hours as needed (pain). 04/02/21   Worthy Rancher, MD  ?Prenatal Vit-Fe Fumarate-FA (PRENATAL VITAMINS) 28-0.8 MG TABS Take 1 tablet by mouth daily. 08/06/20   [place] Bing, MD  ?   ? ?Allergies    ?Macrobid [nitrofurantoin macrocrystal], Phenergan [promethazine hcl], and Tessalon [benzonatate]   ? ?Review of Systems   ?Review of Systems ? ?Physical Exam ?Updated Vital Signs ?BP (!) 125/58 (BP Location: Left Arm)   Pulse 82   Temp 98.1 ?F (36.7 ?C) (Oral)   Resp 18   Ht 5\' 7"  (1.702 m)   Wt 81.6 kg   LMP 08/06/2021   SpO2 98%   Breastfeeding No   BMI 28.19 kg/m?  ?Physical Exam ?Vitals and nursing note reviewed. Exam conducted with a chaperone present.  ?Constitutional:   ?   General: She is not in acute distress. ?   Appearance: Normal appearance.  ?HENT:  ?   Head: Normocephalic and atraumatic.  ?   Mouth/Throat:  ?   Pharynx: Uvula swelling present.  ?   Tonsils: No tonsillar exudate. 2+ on the right. 2+ on the left.  ?   Comments: Uvula is midline, no subcu lingual or submandibular tenderness. ?Eyes:  ?   General: No scleral icterus. ?   Extraocular Movements: Extraocular movements intact.  ?   Pupils: Pupils are equal, round,  and reactive to light.  ?Skin: ?   Coloration: Skin is not jaundiced.  ?Neurological:  ?   Mental Status: She is alert. Mental status is at baseline.  ?   Coordination: Coordination normal.  ? ? ?ED Results / Procedures / Treatments   ?Labs ?(all labs ordered are listed, but only abnormal results are displayed) ?Labs Reviewed  ?GROUP A STREP BY PCR  ? ? ?EKG ?None ? ?Radiology ?No results found. ? ?Procedures ?Procedures  ? ? ?Medications Ordered in ED ?Medications  ?dexamethasone (DECADRON) injection 10 mg (has no administration in time range)  ? ? ?ED Course/ Medical Decision Making/ A&P ?  ?                        ?Medical Decision Making ? ?This is a 31 year old female presenting with dysphagia x3 days.  Considered peritonsillar abscess retropharyngeal abscess but physical exam is not consistent with this.  Additionally exam does not support Ludwig angina.  Strep test was ordered and viewed by myself.  Negative for strep, suspect viral pharyngitis.  I ordered the patient a shot of Decadron, on reevaluation she reported improvement.  Stable for discharge at this time. ? ? ? ? ? ? ? ?Final Clinical Impression(s) / ED Diagnoses ?Final diagnoses:  ?  None  ? ? ?Rx / DC Orders ?ED Discharge Orders   ? ? None  ? ?  ? ? ?  ?Theron Arista, PA-C ?08/09/21 2200 ? ?  ?Rozelle Logan, DO ?08/09/21 2351 ? ?

## 2021-08-12 ENCOUNTER — Telehealth: Payer: Self-pay

## 2021-08-12 ENCOUNTER — Emergency Department (HOSPITAL_BASED_OUTPATIENT_CLINIC_OR_DEPARTMENT_OTHER)
Admission: EM | Admit: 2021-08-12 | Discharge: 2021-08-12 | Disposition: A | Discharge status: Home / Self Care | Payer: BC Managed Care – PPO | Attending: Emergency Medicine | Admitting: Emergency Medicine

## 2021-08-12 ENCOUNTER — Encounter (HOSPITAL_BASED_OUTPATIENT_CLINIC_OR_DEPARTMENT_OTHER): Payer: Self-pay | Admitting: *Deleted

## 2021-08-12 DIAGNOSIS — J029 Acute pharyngitis, unspecified: Secondary | ICD-10-CM | POA: Insufficient documentation

## 2021-08-12 DIAGNOSIS — R509 Fever, unspecified: Secondary | ICD-10-CM | POA: Diagnosis not present

## 2021-08-12 DIAGNOSIS — H1032 Unspecified acute conjunctivitis, left eye: Secondary | ICD-10-CM | POA: Insufficient documentation

## 2021-08-12 MED ORDER — DEXAMETHASONE 6 MG PO TABS
6.0000 mg | ORAL_TABLET | Freq: Once | ORAL | 0 refills | Status: AC
Start: 2021-08-12 — End: 2021-08-12

## 2021-08-12 MED ORDER — AMOXICILLIN-POT CLAVULANATE 875-125 MG PO TABS: 1.0000 | ORAL_TABLET | Freq: Two times a day (BID) | ORAL | 0 refills | Status: DC

## 2021-08-12 NOTE — ED Notes (Signed)
ED PA in Triage Room with client ?

## 2021-08-12 NOTE — Telephone Encounter (Signed)
Transition Care Management Follow-up Telephone Call ?Date of discharge and from where: 08/09/2021 from [facility] ?How have you been since you were released from the hospital? Patient stated that she has pulled up to [facility] to be reevaluated again. Her sx have worsened.  ?Any questions or concerns? Yes ? ? ?

## 2021-08-12 NOTE — ED Triage Notes (Signed)
Here in ED a few days ago for evaluation of sore throat, still has sore throat with drainage. States now has a fever.  ?

## 2021-08-12 NOTE — Discharge Instructions (Signed)
Take medicine as directed.  ?Recheck with your doctor if symptoms continue.  ?Continue with Xyzal daily ?

## 2021-08-12 NOTE — ED Provider Triage Note (Signed)
Emergency Medicine Provider Triage Evaluation Note ? ?Tara Sanchez , a 31 y.o. female  was evaluated in triage.  Pt complains of sore throat, seen in ER a few days ago, negative strep, ongoing/worsening throat. Fever (99.8), left eye redness, drainage ?Given unknown shot and throat felt better then symptoms returned.  ?Review of Systems  ?Positive: ST, fever, left eye redness  ?Negative: Vomiting  ? ?Physical Exam  ?LMP 08/06/2021  ?Gen:   Awake, no distress   ?Resp:  Normal effort  ?MSK:   Moves extremities without difficulty  ?Other:  Tms normal, throat red, no exudate, no tender LNs ? ?Medical Decision Making  ?Medically screening exam initiated at 10:38 AM.  Appropriate orders placed.  Jerika SHERELLE CASTELLI was informed that the remainder of the evaluation will be completed by another provider, this initial triage assessment does not replace that evaluation, and the importance of remaining in the ED until their evaluation is complete. ? ? ?  ?Jeannie Fend, PA-C ?08/12/21 1042 ? ?

## 2021-08-12 NOTE — ED Provider Notes (Signed)
?[facility] EMERGENCY DEPARTMENT ?Provider Note ? ? ?CSN: [id number] ?Arrival date & time: 08/12/21  1025 ? ?  ? ?History ? ?Chief Complaint  ?Patient presents with  ? Sore Throat  ? ? ?Tara Sanchez is a 31 y.o. female. ? ?31 y.o. female  was evaluated in triage.  Pt complains of sore throat, seen in ER a few days ago, negative strep, ongoing/worsening throat. Fever (99.8), left eye redness, drainage (purulent).  Daughter now also with concern for bilateral eye infection, here to be seen as well. ?Given IM Decadron and throat felt better then symptoms returned to also question some improvement after 1 dose of Xyzal.  ? ? ?  ? ?Home Medications ?Prior to Admission medications   ?Medication Sig Start Date End Date Taking? Authorizing Provider  ?amoxicillin-clavulanate (AUGMENTIN) 875-125 MG tablet Take 1 tablet by mouth every 12 (twelve) hours. 08/12/21  Yes Jeannie Fend, PA-C  ?dexamethasone (DECADRON) 6 MG tablet Take 1 tablet (6 mg total) by mouth once for 1 dose. 08/12/21 08/12/21 Yes Jeannie Fend, PA-C  ?acetaminophen (TYLENOL) 500 MG tablet Take 2 tablets (1,000 mg total) by mouth every 8 (eight) hours as needed (pain). 04/02/21   Worthy Rancher, MD  ?ibuprofen (ADVIL) 600 MG tablet Take 1 tablet (600 mg total) by mouth every 6 (six) hours as needed (pain). 04/02/21   Worthy Rancher, MD  ?Prenatal Vit-Fe Fumarate-FA (PRENATAL VITAMINS) 28-0.8 MG TABS Take 1 tablet by mouth daily. 08/06/20   Gallia Bing, MD  ?   ? ?Allergies    ?Macrobid [nitrofurantoin macrocrystal], Phenergan [promethazine hcl], and Tessalon [benzonatate]   ? ?Review of Systems   ?Review of Systems ?Negative except as per HPI ?Physical Exam ?Updated Vital Signs ?BP 125/84 (BP Location: Right Arm)   Pulse 68   Temp 98.2 ?F (36.8 ?C) (Oral)   Resp 18   LMP 08/06/2021   SpO2 99%  ?Physical Exam ?Vitals and nursing note reviewed.  ?Constitutional:   ?   General: She is not in acute distress. ?   Appearance: She is  well-developed. She is not diaphoretic.  ?HENT:  ?   Head: Normocephalic and atraumatic.  ?   Right Ear: Tympanic membrane and ear canal normal.  ?   Left Ear: Tympanic membrane and ear canal normal.  ?   Nose: No congestion.  ?   Mouth/Throat:  ?   Pharynx: Posterior oropharyngeal erythema present. No pharyngeal swelling, oropharyngeal exudate or uvula swelling.  ?   Tonsils: No tonsillar exudate or tonsillar abscesses. 1+ on the right. 1+ on the left.  ?Eyes:  ?   Conjunctiva/sclera:  ?   Right eye: Right conjunctiva is injected.  ?   Left eye: Left conjunctiva is injected.  ?Cardiovascular:  ?   Rate and Rhythm: Normal rate and regular rhythm.  ?   Heart sounds: Normal heart sounds.  ?Pulmonary:  ?   Effort: Pulmonary effort is normal.  ?Musculoskeletal:  ?   Cervical back: Neck supple.  ?Lymphadenopathy:  ?   Cervical: No cervical adenopathy.  ?Skin: ?   General: Skin is warm and dry.  ?Neurological:  ?   Mental Status: She is alert and oriented to person, place, and time.  ?Psychiatric:     ?   Behavior: Behavior normal.  ? ? ?ED Results / Procedures / Treatments   ?Labs ?(all labs ordered are listed, but only abnormal results are displayed) ?Labs Reviewed - No data to display ? ?EKG ?  None ? ?Radiology ?No results found. ? ?Procedures ?Procedures  ? ? ?Medications Ordered in ED ?Medications - No data to display ? ?ED Course/ Medical Decision Making/ A&P ?  ?                        ?Medical Decision Making ?Risk ?Prescription drug management. ? ? ?31 year old female returns the emergency room with ongoing sore throat after negative PCR strep test in this ED a few days ago, treated with Decadron which provided temporary relief.  Now presents with concern for left eye infection with redness and drainage from the left eye without visual disturbance.  Found to have injected conjunctiva bilaterally.  No tender cervical and Fadden apathy, oropharyngeal erythema without exudate, no significant changes to tonsils.   Encourage patient to continue with her Xyzal, hydrate, Motrin and Tylenol.  Will cover throat and eyes with Augmentin.  Advised to recheck with PCP if symptoms do not improve. ? ? ? ? ? ? ? ?Final Clinical Impression(s) / ED Diagnoses ?Final diagnoses:  ?Pharyngitis, unspecified etiology  ?Acute conjunctivitis of left eye, unspecified acute conjunctivitis type  ? ? ?Rx / DC Orders ?ED Discharge Orders   ? ?      Ordered  ?  dexamethasone (DECADRON) 6 MG tablet   Once       ? 08/12/21 1052  ?  amoxicillin-clavulanate (AUGMENTIN) 875-125 MG tablet  Every 12 hours       ? 08/12/21 1052  ? ?  ?  ? ?  ? ? ?  ?Jeannie Fend, PA-C ?08/12/21 1127 ? ?  ?Milagros Loll, MD ?08/13/21 1222 ? ?

## 2021-08-13 ENCOUNTER — Telehealth: Payer: Self-pay

## 2021-08-13 NOTE — Telephone Encounter (Signed)
Transition Care Management Follow-up Telephone Call ?Date of discharge and from where: 08/12/2021 from [facility] ?How have you been since you were released from the hospital? Patient stated that she is feeling much better and did not have any questions or concerns at this time.  ?Any questions or concerns? No ? ?Items Reviewed: ?Did the pt receive and understand the discharge instructions provided? Yes  ?Medications obtained and verified? Yes  ?Other? No  ?Any new allergies since your discharge? No  ?Dietary orders reviewed? No ?Do you have support at home? Yes  ? ?Functional Questionnaire: (I = Independent and D = Dependent) ?ADLs: I ? ?Bathing/Dressing- I ? ?Meal Prep- I ? ?Eating- I ? ?Maintaining continence- I ? ?Transferring/Ambulation- I ? ?Managing Meds- I ? ? ?Follow up appointments reviewed: ? ?PCP Hospital f/u appt confirmed? No  Patient stated that she is not ready to est.  ?Specialist Hospital f/u appt confirmed? No   ?Are transportation arrangements needed? No  ?If their condition worsens, is the pt aware to call PCP or go to the Emergency Dept.? Yes ?Was the patient provided with contact information for the PCP's office or ED? Yes ?Was to pt encouraged to call back with questions or concerns? Yes ? ?

## 2021-09-04 ENCOUNTER — Other Ambulatory Visit (HOSPITAL_BASED_OUTPATIENT_CLINIC_OR_DEPARTMENT_OTHER): Payer: Self-pay

## 2021-09-04 ENCOUNTER — Emergency Department (HOSPITAL_BASED_OUTPATIENT_CLINIC_OR_DEPARTMENT_OTHER)
Admission: EM | Admit: 2021-09-04 | Discharge: 2021-09-04 | Disposition: A | Discharge status: Home / Self Care | Payer: BC Managed Care – PPO | Attending: Emergency Medicine | Admitting: Emergency Medicine

## 2021-09-04 ENCOUNTER — Encounter (HOSPITAL_BASED_OUTPATIENT_CLINIC_OR_DEPARTMENT_OTHER): Payer: Self-pay | Admitting: Emergency Medicine

## 2021-09-04 ENCOUNTER — Other Ambulatory Visit: Payer: Self-pay

## 2021-09-04 DIAGNOSIS — Z79899 Other long term (current) drug therapy: Secondary | ICD-10-CM | POA: Diagnosis not present

## 2021-09-04 DIAGNOSIS — L03312 Cellulitis of back [any part except buttock]: Secondary | ICD-10-CM | POA: Diagnosis not present

## 2021-09-04 DIAGNOSIS — L0390 Cellulitis, unspecified: Secondary | ICD-10-CM

## 2021-09-04 MED ORDER — MUPIROCIN CALCIUM 2 % EX CREA
1.0000 "application " | TOPICAL_CREAM | Freq: Two times a day (BID) | CUTANEOUS | 0 refills | Status: DC
  Filled 2021-09-04 (×2): qty 15, 8d supply, fill #0

## 2021-09-04 MED ORDER — CLINDAMYCIN HCL 300 MG PO CAPS
300.0000 mg | ORAL_CAPSULE | Freq: Three times a day (TID) | ORAL | 0 refills | Status: AC
  Filled 2021-09-04: qty 30, 10d supply, fill #0

## 2021-09-04 NOTE — ED Provider Notes (Signed)
?[place] EMERGENCY DEPARTMENT ?Provider Note ? ? ?CSN: [id number] ?Arrival date & time: 09/04/21  1214 ? ?  ? ?History ? ?Chief Complaint  ?Patient presents with  ? Insect Bite  ? ? ?Tara Sanchez is a 31 y.o. female. ? ?Patient here for concern for infection in her low back.  She was bit by some sort of insect 2 days ago and now having pain and redness around the area.  Denies any fevers or chills.  Nothing has made it worse or better.  No headaches, fever, chills, abdominal pain, nausea, vomiting. ? ?The history is provided by the patient.  ? ?  ? ?Home Medications ?Prior to Admission medications   ?Medication Sig Start Date End Date Taking? Authorizing Provider  ?clindamycin (CLEOCIN) 300 MG capsule Take 1 capsule (300 mg total) by mouth 3 (three) times daily for 10 days. 09/04/21 09/14/21 Yes Jolly Bleicher, DO  ?mupirocin cream (BACTROBAN) 2 % Apply 1 application. topically 2 (two) times daily. 09/04/21  Yes Peace Jost, DO  ?acetaminophen (TYLENOL) 500 MG tablet Take 2 tablets (1,000 mg total) by mouth every 8 (eight) hours as needed (pain). 04/02/21   Genia Del, MD  ?ibuprofen (ADVIL) 600 MG tablet Take 1 tablet (600 mg total) by mouth every 6 (six) hours as needed (pain). 04/02/21   Genia Del, MD  ?Prenatal Vit-Fe Fumarate-FA (PRENATAL VITAMINS) 28-0.8 MG TABS Take 1 tablet by mouth daily. 08/06/20   Aletha Halim, MD  ?   ? ?Allergies    ?Macrobid [nitrofurantoin macrocrystal], Phenergan [promethazine hcl], and Tessalon [benzonatate]   ? ?Review of Systems   ?Review of Systems ? ?Physical Exam ?Updated Vital Signs ?BP 117/73 (BP Location: Left Arm)   Pulse (!) 103   Temp 98.2 ?F (36.8 ?C) (Oral)   Resp 18   Ht 5\' 7"  (1.702 m)   Wt 77.1 kg   LMP 08/06/2021   SpO2 99%   BMI 26.63 kg/m?  ?Physical Exam ?Constitutional:   ?   General: She is not in acute distress. ?   Appearance: She is not ill-appearing.  ?Cardiovascular:  ?   Pulses: Normal pulses.  ?Skin: ?    Capillary Refill: Capillary refill takes less than 2 seconds.  ?   Findings: Erythema present.  ?   Comments: Patient has about a 5 x 3 cm area of redness around wound site on the low back, there is no fluctuance or purulent drainage  ?Neurological:  ?   General: No focal deficit present.  ?   Mental Status: She is alert.  ? ? ?ED Results / Procedures / Treatments   ?Labs ?(all labs ordered are listed, but only abnormal results are displayed) ?Labs Reviewed - No data to display ? ?EKG ?None ? ?Radiology ?No results found. ? ?Procedures ?Procedures  ? ? ?Medications Ordered in ED ?Medications - No data to display ? ?ED Course/ Medical Decision Making/ A&P ?  ?                        ?Medical Decision Making ?Risk ?Prescription drug management. ? ? ?Ch[place]ier is here with what appears to be cellulitis around bug bite.  Was bit by something several nights ago.  Has pain and redness around the bug bite.  She has normal vitals.  No fever.  This appears to be a cellulitis.  There is no fluctuance or drainage and no concern for abscess at this time.  Will prescribe Bactroban and clindamycin.  We will outline redness and have her follow-up if needed if she does not have major improvement over the next 48 to 72 hours.  Recommend warm compresses for pain relief as well as Tylenol and ibuprofen.  Discharged in good condition. ? ?This chart was dictated using voice recognition software.  Despite best efforts to proofread,  errors can occur which can change the documentation meaning.  ? ? ? ? ? ? ? ?Final Clinical Impression(s) / ED Diagnoses ?Final diagnoses:  ?Cellulitis, unspecified cellulitis site  ? ? ?Rx / DC Orders ?ED Discharge Orders   ? ?      Ordered  ?  clindamycin (CLEOCIN) 300 MG capsule  3 times daily       ? 09/04/21 1249  ?  mupirocin cream (BACTROBAN) 2 %  2 times daily       ? 09/04/21 1249  ? ?  ?  ? ?  ? ? ?  ?Lennice Sites, DO ?09/04/21 1252 ? ?

## 2021-09-04 NOTE — Discharge Instructions (Addendum)
Overall I think you have a mild skin infection around your insect/bug bite.  Recommend topical antibiotic twice daily to the area as well as warm compresses as discussed for pain.  Recommend Tylenol and ibuprofen for pain as well.  I also have prescribed you an oral antibiotic to.  If you feel like you are not having any improvement after 48 to 72 hours please return for reevaluation. ?

## 2021-09-04 NOTE — ED Triage Notes (Signed)
Pt via pov from home with insect bite to her lower back. Pt reports that it happened on Monday, and that the pain has become unbearable. Pt has red area around a scabbed center; she reports that she cannot even wear underwear because it hits right on the bite and it is extremely painful. Pt alert & oriented, nad noted.  ?

## 2021-09-05 ENCOUNTER — Other Ambulatory Visit (HOSPITAL_BASED_OUTPATIENT_CLINIC_OR_DEPARTMENT_OTHER): Payer: Self-pay

## 2021-09-06 ENCOUNTER — Other Ambulatory Visit (HOSPITAL_BASED_OUTPATIENT_CLINIC_OR_DEPARTMENT_OTHER): Payer: Self-pay

## 2021-09-06 ENCOUNTER — Telehealth: Payer: Self-pay

## 2021-09-06 NOTE — Patient Outreach (Signed)
Care Coordination  09/06/2021  Tara Sanchez 1991-01-12 [id number]  Transition Care Management Follow-up Telephone Call Date of discharge and from where: 09/04/21 [facility] How have you been since you were released from the hospital? It hurts, the bite is bad. I have been taking my antibiotics, but if it gets any worse I am going back to the ED.  Any questions or concerns? No  Items Reviewed: Did the pt receive and understand the discharge instructions provided? Yes  Medications obtained and verified? No  Other? No  Any new allergies since your discharge? No  Dietary orders reviewed? Yes Do you have support at home? Yes   Functional Questionnaire: (I = Independent and D = Dependent) ADLs: I  Bathing/Dressing- I  Meal Prep- I  Eating- I  Maintaining continence- I  Transferring/Ambulation- I  Managing Meds- I  Follow up appointments reviewed:  PCP Hospital f/u appt confirmed? No  Scheduled to see  Specialist Hospital f/u appt confirmed? No  Scheduled to see  Are transportation arrangements needed? No  If their condition worsens, is the pt aware to call PCP or go to the Emergency Dept.? Yes Was the patient provided with contact information for the PCP's office or ED? Yes Was to pt encouraged to call back with questions or concerns? Yes MM Services offered and accepted

## 2021-09-06 NOTE — Patient Instructions (Signed)
Thank you for speaking with me today regarding care management and care coordination needs.  Thank you for speaking with me today. I will follow up with you in 14 days. If you need anything before then I can be reached at [phone].  Thank you [name], BSW, [facility]  [facility]  High Risk Managed Medicaid Team  [phone]

## 2021-09-26 ENCOUNTER — Other Ambulatory Visit: Payer: Self-pay

## 2021-09-26 NOTE — Patient Outreach (Signed)
Care Coordination  09/26/2021  Tara Sanchez Nov 29, 1990 [id number]   Medicaid Managed Care   Unsuccessful Outreach Note  09/26/2021 Name: Tara Sanchez MRN: [id number] DOB: [birth year]  Referred by: Pcp, No Reason for referral : High Risk Managed Medicaid (MM social worker unsuccessful telephone outreach)   An unsuccessful telephone outreach was attempted today. The patient was referred to the case management team for assistance with care management and care coordination.   Follow Up Plan: The patient has been provided with contact information for the care management team and has been advised to call with any health related questions or concerns.   Mickel Fuchs, BSW, [place] Managed Medicaid Team  [phone]

## 2021-09-26 NOTE — Patient Instructions (Signed)
Visit Information  Ms. Tara Sanchez  - as a part of your Medicaid benefit, you are eligible for care management and care coordination services at no cost or copay. I was unable to reach you by phone today but would be happy to help you with your health related needs. Please feel free to call me @ [phone].      Mickel Fuchs, BSW, [place] Managed Medicaid Team  [phone]

## 2021-10-20 ENCOUNTER — Other Ambulatory Visit: Payer: Self-pay

## 2021-10-20 ENCOUNTER — Encounter (HOSPITAL_BASED_OUTPATIENT_CLINIC_OR_DEPARTMENT_OTHER): Payer: Self-pay

## 2021-10-20 ENCOUNTER — Emergency Department (HOSPITAL_BASED_OUTPATIENT_CLINIC_OR_DEPARTMENT_OTHER)
Admission: EM | Admit: 2021-10-20 | Discharge: 2021-10-20 | Disposition: A | Discharge status: Home / Self Care | Payer: BC Managed Care – PPO | Attending: Emergency Medicine | Admitting: Emergency Medicine

## 2021-10-20 DIAGNOSIS — Z20822 Contact with and (suspected) exposure to covid-19: Secondary | ICD-10-CM | POA: Diagnosis not present

## 2021-10-20 DIAGNOSIS — J020 Streptococcal pharyngitis: Secondary | ICD-10-CM | POA: Diagnosis not present

## 2021-10-20 DIAGNOSIS — R000 Tachycardia, unspecified: Secondary | ICD-10-CM | POA: Insufficient documentation

## 2021-10-20 DIAGNOSIS — J029 Acute pharyngitis, unspecified: Secondary | ICD-10-CM | POA: Diagnosis present

## 2021-10-20 LAB — SARS CORONAVIRUS 2 BY RT PCR: SARS Coronavirus 2 by RT PCR: NEGATIVE

## 2021-10-20 LAB — GROUP A STREP BY PCR: Group A Strep by PCR: DETECTED — AB

## 2021-10-20 MED ORDER — IBUPROFEN 800 MG PO TABS
800.0000 mg | ORAL_TABLET | Freq: Once | ORAL | Status: AC
  Administered 2021-10-20: 800 mg via ORAL

## 2021-10-20 MED ORDER — IBUPROFEN 800 MG PO TABS
ORAL_TABLET | ORAL | Status: AC
  Filled 2021-10-20: qty 1

## 2021-10-20 MED ORDER — ACETAMINOPHEN 325 MG PO TABS
650.0000 mg | ORAL_TABLET | Freq: Once | ORAL | Status: AC | PRN
  Administered 2021-10-20: 650 mg via ORAL
  Filled 2021-10-20: qty 2

## 2021-10-20 MED ORDER — DEXAMETHASONE SODIUM PHOSPHATE 10 MG/ML IJ SOLN
10.0000 mg | Freq: Once | INTRAMUSCULAR | Status: AC
  Administered 2021-10-20: 10 mg via INTRAMUSCULAR
  Filled 2021-10-20: qty 1

## 2021-10-20 MED ORDER — PENICILLIN G BENZATHINE 1200000 UNIT/2ML IM SUSY
1.2000 10*6.[IU] | PREFILLED_SYRINGE | Freq: Once | INTRAMUSCULAR | Status: AC
  Administered 2021-10-20: 1.2 10*6.[IU] via INTRAMUSCULAR
  Filled 2021-10-20: qty 2

## 2021-10-20 NOTE — ED Triage Notes (Signed)
Patient states she has had sore throat and fever since last night. Last dose tylenol at 9pm

## 2021-10-20 NOTE — ED Provider Notes (Signed)
[facility] EMERGENCY DEPARTMENT Provider Note   CSN: [id number] Arrival date & time: 10/20/21  0758     History  Chief Complaint  Patient presents with   Sore Throat    Tara Sanchez is a 31 y.o. female.  Patient is a 31 year old female who presents with a sore throat and fever.  She said her symptoms started yesterday.  She says it hurts to swallow.  She is able to swallow her spit.  She denies any shortness of breath.  No runny nose or congestion.  No cough or chest congestion.  No nausea vomiting or diarrhea.  No known sick contacts.       Home Medications Prior to Admission medications   Medication Sig Start Date End Date Taking? Authorizing Provider  acetaminophen (TYLENOL) 500 MG tablet Take 2 tablets (1,000 mg total) by mouth every 8 (eight) hours as needed (pain). 04/02/21   Worthy Rancher, MD  ibuprofen (ADVIL) 600 MG tablet Take 1 tablet (600 mg total) by mouth every 6 (six) hours as needed (pain). 04/02/21   Worthy Rancher, MD  mupirocin cream Idelle Jo) 2 % Apply to affected area(s) twice daily 09/04/21   Virgina Norfolk, DO  Prenatal Vit-Fe Fumarate-FA (PRENATAL VITAMINS) 28-0.8 MG TABS Take 1 tablet by mouth daily. 08/06/20   [place] Bing, MD      Allergies    Macrobid [nitrofurantoin macrocrystal], Phenergan [promethazine hcl], and Tessalon [benzonatate]    Review of Systems   Review of Systems  Constitutional:  Positive for chills and fever. Negative for diaphoresis and fatigue.  HENT:  Positive for sore throat. Negative for congestion, rhinorrhea and sneezing.   Eyes: Negative.   Respiratory:  Negative for cough, chest tightness and shortness of breath.   Cardiovascular:  Negative for chest pain and leg swelling.  Gastrointestinal:  Negative for abdominal pain, blood in stool, diarrhea, nausea and vomiting.  Genitourinary:  Negative for difficulty urinating, flank pain, frequency and hematuria.  Musculoskeletal:  Negative for  arthralgias and back pain.  Skin:  Negative for rash.  Neurological:  Negative for dizziness, speech difficulty, weakness, numbness and headaches.    Physical Exam Updated Vital Signs BP 110/60   Pulse (!) 112   Temp (!) 102.3 F (39.1 C)   Resp 20   SpO2 98%  Physical Exam Constitutional:      Appearance: She is well-developed.  HENT:     Head: Normocephalic and atraumatic.     Right Ear: Tympanic membrane normal.     Left Ear: Tympanic membrane normal.     Mouth/Throat:     Mouth: Mucous membranes are moist.     Pharynx: Uvula midline.     Comments: Patient has some erythema and small amount of exudates on the tonsils bilaterally, no trismus, uvula is midline, no peritonsillar fullness Eyes:     Pupils: Pupils are equal, round, and reactive to light.  Neck:     Comments: No meningismus Cardiovascular:     Rate and Rhythm: Normal rate and regular rhythm.     Heart sounds: Normal heart sounds.  Pulmonary:     Effort: Pulmonary effort is normal. No respiratory distress.     Breath sounds: Normal breath sounds. No wheezing or rales.  Chest:     Chest wall: No tenderness.  Abdominal:     General: Bowel sounds are normal.     Palpations: Abdomen is soft.     Tenderness: There is no abdominal tenderness. There is no guarding  or rebound.  Musculoskeletal:        General: Normal range of motion.     Cervical back: Normal range of motion and neck supple.  Lymphadenopathy:     Cervical: No cervical adenopathy.  Skin:    General: Skin is warm and dry.     Findings: No rash.  Neurological:     Mental Status: She is alert and oriented to person, place, and time.     ED Results / Procedures / Treatments   Labs (all labs ordered are listed, but only abnormal results are displayed) Labs Reviewed  GROUP A STREP BY PCR - Abnormal; Notable for the following components:      Result Value   Group A Strep by PCR DETECTED (*)    All other components within normal limits  SARS  CORONAVIRUS 2 BY RT PCR    EKG None  Radiology No results found.  Procedures Procedures    Medications Ordered in ED Medications  acetaminophen (TYLENOL) tablet 650 mg (650 mg Oral Given 10/20/21 0822)  dexamethasone (DECADRON) injection 10 mg (10 mg Intramuscular Given 10/20/21 0859)  penicillin g benzathine (BICILLIN LA) 1200000 UNIT/2ML injection 1.2 Million Units (1.2 Million Units Intramuscular Given 10/20/21 0859)  ibuprofen (ADVIL) tablet 800 mg (0 mg Oral Hold 10/20/21 1002)    ED Course/ Medical Decision Making/ A&P                           Medical Decision Making Risk OTC drugs. Prescription drug management.   Patient is a 31 year old female who presents with a 2-day history of sore throat and fever.  She is febrile on arrival with tachycardia.  Her throat exam shows some enlarged inflamed tonsils but no suggestions of peritonsillar abscess or other deep tissue infection.  Labs were obtained and her strep test was positive.  COVID test is negative.  She was given a shot of Decadron and Bicillin.  Her fever was treated.  She is feeling much better.  She is smiling and interactive.  She does not appear toxic.  She still has some tachycardia with a heart rate in the 110s.  This is likely resulting from the ongoing fever.  I did discuss continued observation in the ED after she was given some ibuprofen.  She said that she is ready to go and does not want to wait any longer.  She otherwise is well-appearing.  She was discharged home in good condition.  Return precautions were given.   Final Clinical Impression(s) / ED Diagnoses Final diagnoses:  Strep throat    Rx / DC Orders ED Discharge Orders     None         Rolan Bucco, MD 10/20/21 1008

## 2021-12-09 ENCOUNTER — Emergency Department (HOSPITAL_BASED_OUTPATIENT_CLINIC_OR_DEPARTMENT_OTHER): Payer: BC Managed Care – PPO

## 2021-12-09 ENCOUNTER — Encounter (HOSPITAL_BASED_OUTPATIENT_CLINIC_OR_DEPARTMENT_OTHER): Payer: Self-pay | Admitting: Emergency Medicine

## 2021-12-09 ENCOUNTER — Other Ambulatory Visit: Payer: Self-pay

## 2021-12-09 ENCOUNTER — Emergency Department (HOSPITAL_BASED_OUTPATIENT_CLINIC_OR_DEPARTMENT_OTHER)
Admission: EM | Admit: 2021-12-09 | Discharge: 2021-12-09 | Disposition: A | Discharge status: Home / Self Care | Payer: BC Managed Care – PPO | Attending: Emergency Medicine | Admitting: Emergency Medicine

## 2021-12-09 DIAGNOSIS — Z20822 Contact with and (suspected) exposure to covid-19: Secondary | ICD-10-CM | POA: Diagnosis not present

## 2021-12-09 DIAGNOSIS — B349 Viral infection, unspecified: Secondary | ICD-10-CM | POA: Insufficient documentation

## 2021-12-09 DIAGNOSIS — R509 Fever, unspecified: Secondary | ICD-10-CM | POA: Diagnosis not present

## 2021-12-09 DIAGNOSIS — R569 Unspecified convulsions: Secondary | ICD-10-CM | POA: Diagnosis not present

## 2021-12-09 LAB — CBC WITH DIFFERENTIAL/PLATELET
Abs Immature Granulocytes: 0.02 10*3/uL (ref 0.00–0.07)
Basophils Absolute: 0 10*3/uL (ref 0.0–0.1)
Basophils Relative: 0 %
Eosinophils Absolute: 0 10*3/uL (ref 0.0–0.5)
Eosinophils Relative: 0 %
HCT: 43.3 % (ref 36.0–46.0)
Hemoglobin: 14.3 g/dL (ref 12.0–15.0)
Immature Granulocytes: 0 %
Lymphocytes Relative: 14 %
Lymphs Abs: 1.4 10*3/uL (ref 0.7–4.0)
MCH: 29.2 pg (ref 26.0–34.0)
MCHC: 33 g/dL (ref 30.0–36.0)
MCV: 88.4 fL (ref 80.0–100.0)
Monocytes Absolute: 0.6 10*3/uL (ref 0.1–1.0)
Monocytes Relative: 6 %
Neutro Abs: 8 10*3/uL — ABNORMAL HIGH (ref 1.7–7.7)
Neutrophils Relative %: 80 %
Platelets: 205 10*3/uL (ref 150–400)
RBC: 4.9 MIL/uL (ref 3.87–5.11)
RDW: 13.5 % (ref 11.5–15.5)
WBC: 10 10*3/uL (ref 4.0–10.5)
nRBC: 0 % (ref 0.0–0.2)

## 2021-12-09 LAB — BASIC METABOLIC PANEL
Anion gap: 10 (ref 5–15)
BUN: 10 mg/dL (ref 6–20)
CO2: 21 mmol/L — ABNORMAL LOW (ref 22–32)
Calcium: 8.7 mg/dL — ABNORMAL LOW (ref 8.9–10.3)
Chloride: 104 mmol/L (ref 98–111)
Creatinine, Ser: 0.68 mg/dL (ref 0.44–1.00)
GFR, Estimated: >60 mL/min (ref >60)
Glucose, Bld: 76 mg/dL (ref 70–99)
Potassium: 3.8 mmol/L (ref 3.5–5.1)
Sodium: 135 mmol/L (ref 135–145)

## 2021-12-09 LAB — GROUP A STREP BY PCR: Group A Strep by PCR: NOT DETECTED

## 2021-12-09 LAB — SARS CORONAVIRUS 2 BY RT PCR: SARS Coronavirus 2 by RT PCR: NEGATIVE

## 2021-12-09 LAB — MONONUCLEOSIS SCREEN: Mono Screen: NEGATIVE

## 2021-12-09 MED ORDER — ACETAMINOPHEN 325 MG PO TABS
650.0000 mg | ORAL_TABLET | Freq: Once | ORAL | Status: AC | PRN
  Administered 2021-12-09: 650 mg via ORAL
  Filled 2021-12-09: qty 2

## 2021-12-09 MED ORDER — IBUPROFEN 400 MG PO TABS
600.0000 mg | ORAL_TABLET | Freq: Once | ORAL | Status: AC
  Administered 2021-12-09: 600 mg via ORAL
  Filled 2021-12-09: qty 1

## 2021-12-09 MED ORDER — SODIUM CHLORIDE 0.9 % IV BOLUS
1000.0000 mL | Freq: Once | INTRAVENOUS | Status: AC
  Administered 2021-12-09: 1000 mL via INTRAVENOUS

## 2021-12-09 MED ORDER — KETOROLAC TROMETHAMINE 15 MG/ML IJ SOLN
15.0000 mg | Freq: Once | INTRAMUSCULAR | Status: AC
  Administered 2021-12-09: 15 mg via INTRAVENOUS
  Filled 2021-12-09: qty 1

## 2021-12-09 NOTE — ED Triage Notes (Signed)
Patient presents POV stating she woke up with fever/chills, body aches and had apparent "seizure" at work. Per patient she fell to the floor and began shaking. Did not want coworkers to call EMS. Denies sick contacts, no known COVID exposure.

## 2021-12-09 NOTE — ED Notes (Addendum)
Seizure pads on (L) and (R) rails of bed

## 2021-12-09 NOTE — ED Provider Notes (Signed)
[facility] EMERGENCY DEPARTMENT Provider Note   CSN: [id number] Arrival date & time: 12/09/21  1412     History  Chief Complaint  Patient presents with   Fever   Seizures    Tara Sanchez is a 31 y.o. female with no known past medical history presenting today with a fever and questionable seizure.  She reports last night she started to have a sore throat, only sore when she is swallowing, and this morning she had chills and body aches.  She went to work and remembers falling down.  People reports that she may have had a seizure.  Has never had a seizure before.  No known sick contacts.  No chest pain but endorsing mild shortness of breath.   Fever Associated symptoms: chills, myalgias, nausea and sore throat   Associated symptoms: no chest pain, no cough, no diarrhea and no vomiting   Seizures      Home Medications Prior to Admission medications   Medication Sig Start Date End Date Taking? Authorizing Provider  acetaminophen (TYLENOL) 500 MG tablet Take 2 tablets (1,000 mg total) by mouth every 8 (eight) hours as needed (pain). 04/02/21   Worthy Rancher, MD  ibuprofen (ADVIL) 600 MG tablet Take 1 tablet (600 mg total) by mouth every 6 (six) hours as needed (pain). 04/02/21   Worthy Rancher, MD  mupirocin cream Idelle Jo) 2 % Apply to affected area(s) twice daily 09/04/21   Virgina Norfolk, DO  Prenatal Vit-Fe Fumarate-FA (PRENATAL VITAMINS) 28-0.8 MG TABS Take 1 tablet by mouth daily. 08/06/20   [place] Bing, MD      Allergies    Macrobid [nitrofurantoin macrocrystal], Phenergan [promethazine hcl], and Tessalon [benzonatate]    Review of Systems   Review of Systems  Constitutional:  Positive for chills and fever.  HENT:  Positive for sore throat.   Respiratory:  Negative for cough and chest tightness.   Cardiovascular:  Negative for chest pain and palpitations.  Gastrointestinal:  Positive for nausea. Negative for constipation, diarrhea and  vomiting.  Musculoskeletal:  Positive for myalgias.  Neurological:  Positive for seizures.    Physical Exam Updated Vital Signs BP 126/73   Pulse (!) 106   Temp (!) 101 F (38.3 C) (Oral)   Resp 20   Ht 5\' 9"  (1.753 m)   Wt 72.6 kg   SpO2 100%   BMI 23.63 kg/m  Physical Exam Vitals and nursing note reviewed.  Constitutional:      Appearance: Normal appearance.  HENT:     Head: Normocephalic and atraumatic.     Right Ear: Tympanic membrane normal.     Left Ear: Tympanic membrane normal.     Nose: Nose normal.     Mouth/Throat:     Mouth: Mucous membranes are moist.     Pharynx: Oropharynx is clear.     Comments: Tongue with some dark discoloration.  She reports this has been present since she was a child.  No trauma in the oropharynx consistent with seizure Eyes:     General: No scleral icterus.    Extraocular Movements: Extraocular movements intact.     Conjunctiva/sclera: Conjunctivae normal.     Pupils: Pupils are equal, round, and reactive to light.  Cardiovascular:     Rate and Rhythm: Regular rhythm. Tachycardia present.  Pulmonary:     Effort: Pulmonary effort is normal. No respiratory distress.     Breath sounds: No wheezing.  Abdominal:     Palpations: Abdomen is soft.  Tenderness: There is no abdominal tenderness.  Musculoskeletal:     Cervical back: Normal range of motion.  Skin:    General: Skin is warm and dry.     Capillary Refill: Capillary refill takes less than 2 seconds.     Findings: No rash.  Neurological:     Mental Status: She is alert.  Psychiatric:        Mood and Affect: Mood normal.        Behavior: Behavior normal.     ED Results / Procedures / Treatments   Labs (all labs ordered are listed, but only abnormal results are displayed) Labs Reviewed  CBC WITH DIFFERENTIAL/PLATELET - Abnormal; Notable for the following components:      Result Value   Neutro Abs 8.0 (*)    All other components within normal limits  BASIC  METABOLIC PANEL - Abnormal; Notable for the following components:   CO2 21 (*)    Calcium 8.7 (*)    All other components within normal limits  SARS CORONAVIRUS 2 BY RT PCR  GROUP A STREP BY PCR  MONONUCLEOSIS SCREEN    EKG EKG Interpretation  Date/Time:  Monday December 09 2021 17:08:28 EDT Ventricular Rate:  101 PR Interval:  159 QRS Duration: 73 QT Interval:  317 QTC Calculation: 411 R Axis:   46 Text Interpretation: Sinus tachycardia when compared to prior, slightly faster rate. No STEMI Confirmed by Theda Belfast ([id number]) on 12/09/2021 5:21:04 PM  Radiology DG Chest Portable 1 View  Result Date: 12/09/2021 CLINICAL DATA:  Shortness of breath EXAM: PORTABLE CHEST 1 VIEW COMPARISON:  11/26/2018 FINDINGS: The heart size and mediastinal contours are within normal limits. Both lungs are clear. The visualized skeletal structures are unremarkable. IMPRESSION: No active disease. Electronically Signed   By: Duanne Guess D.O.   On: 12/09/2021 18:10    Procedures Procedures   Medications Ordered in ED Medications  acetaminophen (TYLENOL) tablet 650 mg (650 mg Oral Given 12/09/21 1433)  ketorolac (TORADOL) 15 MG/ML injection 15 mg (15 mg Intravenous Given 12/09/21 1755)  sodium chloride 0.9 % bolus 1,000 mL (0 mLs Intravenous Stopped 12/09/21 1903)  ibuprofen (ADVIL) tablet 600 mg (600 mg Oral Given 12/09/21 1843)    ED Course/ Medical Decision Making/ A&P                           Medical Decision Making Amount and/or Complexity of Data Reviewed Labs: ordered. Radiology: ordered.  Risk Prescription drug management.   This patient presents to the ED for concern of sore throat, fever and questionable seizure.  Differential includes but is not limited to viral illness, medication reaction, drug use, electrolyte abnormality, PE.   This is not an exhaustive differential.    Past Medical History / Co-morbidities / Social History: No known medical history.   Additional  history: Per chart review, patient has had multiple ED and primary visits for viral pharyngitis.   Physical Exam: Pertinent physical exam findings include No trauma in the oropharynx, atraumatic head and neck No abdominal tenderness Clear breath sounds, Regular rhythm  Lab Tests: I ordered, and personally interpreted labs.  The pertinent results include:  Negative COVID, no white blood cell count Negative mono and strep   Imaging Studies: I ordered and independently visualized and interpreted chest x-ray which showed no abnormalities. I agree with the radiologist interpretation.   Cardiac Monitoring:  The patient was maintained on a cardiac monitor.  My attending physician  Dr. Rush Landmark viewed and interpreted the cardiac monitored which showed an underlying rhythm of: NSR   Medications: I ordered medication including Tylenol, Toradol and fluids. Reevaluation of the patient after these medicines showed that the patient resolved. I have reviewed the patients home medicines and have made adjustments as needed.   Disposition: 31 year old female presenting today with sore throat, fever and questionable seizure.  No history of seizure.  On arrival she was febrile to 103 and tachycardic.  Experiencing chills and shaking on the stretcher.  She was given fluids, Toradol and Tylenol which brought her fever down to 99.6.  I have a low suspicion for a true seizure today.   She has been in the department for over 4 hours without signs of seizure or postictal state.  Suspect her collapse to be secondary to her illness.   Due to her heart rate she is unable to be PERC negative however she is low likelihood Wells criteria for PE.  I did consider this due to her fever and tachycardia however I think it makes more sense for her to be in the setting of a viral illness given sore throat, chills and body aches.  After 6 hours in the department patient reports feeling better and requesting discharge.  I believe  this is reasonable.  Again, low suspicion seizure and patient may follow-up with PCP.  No indication for neurology referral at this time.  She voiced understanding and ambulated out of the department in stable condition.    Final Clinical Impression(s) / ED Diagnoses Final diagnoses:  Viral illness    Rx / DC Orders ED Discharge Orders     None      Results and diagnoses were explained to the patient. Return precautions discussed in full. Patient had no additional questions and expressed complete understanding.   This chart was dictated using voice recognition software.  Despite best efforts to proofread,  errors can occur which can change the documentation meaning.     Woodroe Chen 12/09/21 2037    Tegeler, Canary Brim, MD 12/09/21 [phone]

## 2021-12-09 NOTE — Discharge Instructions (Addendum)
Take ibuprofen and Tylenol for your fevers.  Be sure to stay hydrated.  Follow-up with a primary care doctor if you continue to have symptoms longer than the end of this week.  Your work note is attached.  It was a pleasure to meet you and I hope that you feel better.

## 2022-01-10 ENCOUNTER — Other Ambulatory Visit (HOSPITAL_COMMUNITY): Payer: Self-pay

## 2022-01-27 ENCOUNTER — Emergency Department (HOSPITAL_BASED_OUTPATIENT_CLINIC_OR_DEPARTMENT_OTHER)
Admission: EM | Admit: 2022-01-27 | Discharge: 2022-01-27 | Disposition: A | Discharge status: Home / Self Care | Payer: BLUE CROSS/BLUE SHIELD | Attending: Emergency Medicine | Admitting: Emergency Medicine

## 2022-01-27 ENCOUNTER — Encounter (HOSPITAL_BASED_OUTPATIENT_CLINIC_OR_DEPARTMENT_OTHER): Payer: Self-pay

## 2022-01-27 ENCOUNTER — Emergency Department (HOSPITAL_BASED_OUTPATIENT_CLINIC_OR_DEPARTMENT_OTHER): Payer: BLUE CROSS/BLUE SHIELD

## 2022-01-27 ENCOUNTER — Other Ambulatory Visit: Payer: Self-pay

## 2022-01-27 DIAGNOSIS — R079 Chest pain, unspecified: Secondary | ICD-10-CM | POA: Diagnosis not present

## 2022-01-27 DIAGNOSIS — R509 Fever, unspecified: Secondary | ICD-10-CM | POA: Diagnosis not present

## 2022-01-27 DIAGNOSIS — K219 Gastro-esophageal reflux disease without esophagitis: Secondary | ICD-10-CM

## 2022-01-27 MED ORDER — ALUM & MAG HYDROXIDE-SIMETH 200-200-20 MG/5ML PO SUSP
30.0000 mL | Freq: Once | ORAL | Status: AC
  Administered 2022-01-27: 30 mL via ORAL
  Filled 2022-01-27: qty 30

## 2022-01-27 MED ORDER — FAMOTIDINE 20 MG PO TABS: 20.0000 mg | ORAL_TABLET | Freq: Two times a day (BID) | ORAL | 0 refills | Status: DC

## 2022-01-27 NOTE — ED Notes (Signed)
Pt endorses immediate relief following GI cocktail.

## 2022-01-27 NOTE — ED Triage Notes (Signed)
Pt to ED by POV from home with c/o intermittent CP that she describes as pressure that has been going on for a couple of months. Pt endorses a lot of stress in her personal life lately. Arrives A+O, VSS, NADN.

## 2022-01-27 NOTE — ED Provider Notes (Signed)
[facility] EMERGENCY DEPARTMENT Provider Note   CSN: [id number] Arrival date & time: 01/27/22  0425     History  Chief Complaint  Patient presents with   Chest Pain    Tara Sanchez is a 31 y.o. female.  The history is provided by the patient.  Chest Pain Pain location:  Substernal area Pain quality: pressure   Pain radiates to:  Does not radiate Pain severity:  Moderate Onset quality:  Gradual Duration:  1 month Progression:  Waxing and waning Context: at rest   Context comment:  Laying flat at night.  tonight worse after eating garlic bread and a small amountof pasta. Relieved by:  Nothing Worsened by:  Nothing Ineffective treatments:  None tried Associated symptoms: no palpitations, no PND, no shortness of breath and no weakness   Risk factors: not pregnant, no prior DVT/PE and no surgery   Patient with a history of Bipolar disorder presents with chest discomfort for one month.  Not exertional.  No SOB.  No nausea or emesis.  Patient reports she is under stress.  No travel.  No OCP.   Past Medical History:  Diagnosis Date   Bipolar 2 disorder (HCC)    Genital herpes    Headache    History of preterm delivery, currently pregnant 04/17/2016   makena   Infection    UTI   Preterm labor        Home Medications Prior to Admission medications   Medication Sig Start Date End Date Taking? Authorizing Provider  famotidine (PEPCID) 20 MG tablet Take 1 tablet (20 mg total) by mouth 2 (two) times daily. 01/27/22  Yes [place] Voytko, MD      Allergies    Macrobid [nitrofurantoin macrocrystal], Phenergan [promethazine hcl], and Tessalon [benzonatate]    Review of Systems   Review of Systems  Respiratory:  Negative for shortness of breath.   Cardiovascular:  Positive for chest pain. Negative for palpitations and PND.  Neurological:  Negative for weakness.  All other systems reviewed and are negative.   Physical Exam Updated Vital Signs BP 128/75 (BP  Location: Left Arm)   Pulse 63   Temp 98.6 F (37 C)   Resp 16   Ht 5\' 9"  (1.753 m)   Wt 72.6 kg   SpO2 100%   BMI 23.64 kg/m  Physical Exam Vitals and nursing note reviewed.  Constitutional:      General: She is not in acute distress.    Appearance: Normal appearance. She is well-developed.  HENT:     Head: Normocephalic and atraumatic.     Nose: Nose normal.  Eyes:     Pupils: Pupils are equal, round, and reactive to light.  Cardiovascular:     Rate and Rhythm: Normal rate and regular rhythm.     Pulses: Normal pulses.     Heart sounds: Normal heart sounds.  Pulmonary:     Effort: Pulmonary effort is normal. No respiratory distress.     Breath sounds: Normal breath sounds.  Abdominal:     General: Bowel sounds are normal. There is no distension.     Palpations: Abdomen is soft.     Tenderness: There is no abdominal tenderness. There is no guarding or rebound.  Genitourinary:    Vagina: No vaginal discharge.  Musculoskeletal:        General: Normal range of motion.     Cervical back: Neck supple.  Skin:    General: Skin is warm and dry.  Capillary Refill: Capillary refill takes less than 2 seconds.     Findings: No erythema or rash.  Neurological:     General: No focal deficit present.     Mental Status: She is alert and oriented to person, place, and time.     Deep Tendon Reflexes: Reflexes normal.  Psychiatric:        Mood and Affect: Mood normal.     ED Results / Procedures / Treatments   Labs (all labs ordered are listed, but only abnormal results are displayed) Labs Reviewed - No data to display  EKG EKG Interpretation  Date/Time:  Monday January 27 2022 04:43:15 EDT Ventricular Rate:  66 PR Interval:  162 QRS Duration: 78 QT Interval:  386 QTC Calculation: 405 R Axis:   57 Text Interpretation: Sinus rhythm Confirmed by Randal Buba, Philemon Riedesel ([id number]) on 01/27/2022 4:45:01 AM  Radiology No results found.  Procedures Procedures    Medications  Ordered in ED Medications  alum & mag hydroxide-simeth (MAALOX/MYLANTA) 200-200-20 MG/5ML suspension 30 mL (30 mLs Oral Given 01/27/22 0444)    ED Course/ Medical Decision Making/ A&P                           Medical Decision Making Patient presents with pressure in chest at rest lying flat, had small amount of pasta and some garlic bread earlier.    Amount and/or Complexity of Data Reviewed External Data Reviewed: notes.    Details: Previous ED notes reviewed  Radiology: ordered and independent interpretation performed.    Details: Normal CXR by me  ECG/medicine tests: ordered and independent interpretation performed. Decision-making details documented in ED Course.  Risk OTC drugs. Risk Details: Immediate relief with GI cocktail.  The history and therapeutic effect are consistent with GERD.  Patient has been given GERD friendly diet instructions.  Will start pepcid.  Follow up with your PMD.  Strict return.      Final Clinical Impression(s) / ED Diagnoses Final diagnoses:  None  Return for intractable cough, coughing up blood, fevers > 100.4 unrelieved by medication, shortness of breath, intractable vomiting, chest pain, shortness of breath, weakness, numbness, changes in speech, facial asymmetry, abdominal pain, passing out, Inability to tolerate liquids or food, cough, altered mental status or any concerns. No signs of systemic illness or infection. The patient is nontoxic-appearing on exam and vital signs are within normal limits.  I have reviewed the triage vital signs and the nursing notes. Pertinent labs & imaging results that were available during my care of the patient were reviewed by me and considered in my medical decision making (see chart for details). After history, exam, and medical workup I feel the patient has been appropriately medically screened and is safe for discharge home. Pertinent diagnoses were discussed with the patient. Patient was given return precautions.    Rx / DC Orders ED Discharge Orders          Ordered    famotidine (PEPCID) 20 MG tablet  2 times daily        01/27/22 0438              Kemba Hoppes, MD 01/27/22 0451

## 2022-03-14 ENCOUNTER — Other Ambulatory Visit: Payer: Self-pay

## 2022-03-14 ENCOUNTER — Emergency Department (HOSPITAL_BASED_OUTPATIENT_CLINIC_OR_DEPARTMENT_OTHER)
Admission: EM | Admit: 2022-03-14 | Discharge: 2022-03-14 | Discharge status: Against Medical Advice | Payer: BC Managed Care – PPO | Attending: Emergency Medicine | Admitting: Emergency Medicine

## 2022-03-14 ENCOUNTER — Encounter (HOSPITAL_BASED_OUTPATIENT_CLINIC_OR_DEPARTMENT_OTHER): Payer: Self-pay | Admitting: Emergency Medicine

## 2022-03-14 DIAGNOSIS — R1030 Lower abdominal pain, unspecified: Secondary | ICD-10-CM | POA: Insufficient documentation

## 2022-03-14 DIAGNOSIS — Z5321 Procedure and treatment not carried out due to patient leaving prior to being seen by health care provider: Secondary | ICD-10-CM | POA: Diagnosis not present

## 2022-03-14 LAB — PREGNANCY, URINE: Preg Test, Ur: NEGATIVE

## 2022-03-14 LAB — URINALYSIS, ROUTINE W REFLEX MICROSCOPIC
Bilirubin Urine: NEGATIVE
Glucose, UA: NEGATIVE mg/dL
Hgb urine dipstick: NEGATIVE
Ketones, ur: NEGATIVE mg/dL
Nitrite: NEGATIVE
Protein, ur: NEGATIVE mg/dL
Specific Gravity, Urine: >=1.03 (ref 1.005–1.030)
pH: 5.5 (ref 5.0–8.0)

## 2022-03-14 LAB — URINALYSIS, MICROSCOPIC (REFLEX): RBC / HPF: NONE SEEN RBC/hpf (ref 0–5)

## 2022-03-14 NOTE — ED Triage Notes (Signed)
Pt c/o lower mid abdominal pain that started 1 hour pta. 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 Denies urinary sx, denies vaginal discharge, denies exposure to std. Requesting testing for stds. Has been on Flagy for a few days for BV. Drank alcohol yesterday.

## 2022-08-27 ENCOUNTER — Other Ambulatory Visit: Payer: Self-pay

## 2022-08-27 ENCOUNTER — Encounter (HOSPITAL_BASED_OUTPATIENT_CLINIC_OR_DEPARTMENT_OTHER): Payer: Self-pay | Admitting: Emergency Medicine

## 2022-08-27 ENCOUNTER — Emergency Department (HOSPITAL_BASED_OUTPATIENT_CLINIC_OR_DEPARTMENT_OTHER)
Admission: EM | Admit: 2022-08-27 | Discharge: 2022-08-27 | Disposition: A | Discharge status: Home / Self Care | Payer: Medicaid Other | Attending: Emergency Medicine | Admitting: Emergency Medicine

## 2022-08-27 DIAGNOSIS — J039 Acute tonsillitis, unspecified: Secondary | ICD-10-CM | POA: Diagnosis not present

## 2022-08-27 DIAGNOSIS — Z20822 Contact with and (suspected) exposure to covid-19: Secondary | ICD-10-CM | POA: Diagnosis not present

## 2022-08-27 DIAGNOSIS — J029 Acute pharyngitis, unspecified: Secondary | ICD-10-CM | POA: Diagnosis present

## 2022-08-27 LAB — GROUP A STREP BY PCR: Group A Strep by PCR: NOT DETECTED

## 2022-08-27 LAB — RESP PANEL BY RT-PCR (RSV, FLU A&B, COVID)  RVPGX2
Influenza A by PCR: NEGATIVE
Influenza B by PCR: NEGATIVE
Resp Syncytial Virus by PCR: NEGATIVE
SARS Coronavirus 2 by RT PCR: NEGATIVE

## 2022-08-27 MED ORDER — DEXAMETHASONE SODIUM PHOSPHATE 10 MG/ML IJ SOLN
10.0000 mg | Freq: Once | INTRAMUSCULAR | Status: AC
  Administered 2022-08-27: 10 mg via INTRAMUSCULAR
  Filled 2022-08-27: qty 1

## 2022-08-27 NOTE — ED Triage Notes (Signed)
Pt c/o sore throat x 2d  

## 2022-08-27 NOTE — ED Provider Notes (Signed)
[place] [facility] AT [facility] Provider Note   CSN: [id number] Arrival date & time: 08/27/22  2002     History  Chief Complaint  Patient presents with   Sore Throat    Tara Sanchez is a 32 y.o. female.  Patient complains of a sore throat.  Patient reports that she has mild episodes of sore throat in the past, patient reports that her strep screen is always negative.  Patient reports she normally gets a shot of Decadron when the swelling gets this bad and that alleviates her symptoms.  Patient denies any fever or chills she denies any cough or congestion.  Patient denies any difficulty swallowing she does not have any difficulty breathing  The history is provided by the patient.  Sore Throat This is a new problem. The current episode started 2 days ago. The problem occurs constantly. The problem has been gradually worsening. Nothing aggravates the symptoms. Nothing relieves the symptoms. She has tried nothing for the symptoms.       Home Medications Prior to Admission medications   Medication Sig Start Date End Date Taking? Authorizing Provider  famotidine (PEPCID) 20 MG tablet Take 1 tablet (20 mg total) by mouth 2 (two) times daily. 01/27/22   Palumbo, April, MD      Allergies    Benzonatate, Macrobid [nitrofurantoin macrocrystal], Phenergan [promethazine hcl], and Promethazine    Review of Systems   Review of Systems  All other systems reviewed and are negative.   Physical Exam Updated Vital Signs BP 120/61 (BP Location: Left Arm)   Pulse 80   Temp 99 F (37.2 C)   Resp 18   Ht 5\' 7"  (1.702 m)   Wt 81.6 kg   SpO2 97%   BMI 28.19 kg/m  Physical Exam Vitals and nursing note reviewed.  Constitutional:      Appearance: She is well-developed.  HENT:     Head: Normocephalic.     Mouth/Throat:     Pharynx: Pharyngeal swelling and posterior oropharyngeal erythema present.     Tonsils: 1+ on the right. 1+ on the left.  Cardiovascular:      Rate and Rhythm: Normal rate.  Pulmonary:     Effort: Pulmonary effort is normal.  Abdominal:     General: There is no distension.  Musculoskeletal:        General: Normal range of motion.     Cervical back: Normal range of motion.  Skin:    General: Skin is warm.  Neurological:     General: No focal deficit present.     Mental Status: She is alert and oriented to person, place, and time.     ED Results / Procedures / Treatments   Labs (all labs ordered are listed, but only abnormal results are displayed) Labs Reviewed  RESP PANEL BY RT-PCR (RSV, FLU A&B, COVID)  [id number]  GROUP A STREP BY PCR    EKG None  Radiology No results found.  Procedures Procedures    Medications Ordered in ED Medications  dexamethasone (DECADRON) injection 10 mg (has no administration in time range)    ED Course/ Medical Decision Making/ A&P                             Medical Decision Making Pt complains of a sore throat   Amount and/or Complexity of Data Reviewed Labs: ordered. Decision-making details documented in ED Course.    Details: Labs  ordered reviewed and interpreted.  Strep screen is negative  Risk Prescription drug management.           Final Clinical Impression(s) / ED Diagnoses Final diagnoses:  Tonsillitis    Rx / DC Orders ED Discharge Orders     None     An After Visit Summary was printed and given to the patient.     Elson Areas, [place] 08/27/22 2306    Tegeler, Canary Brim, MD 08/28/22 [phone]

## 2022-09-01 ENCOUNTER — Ambulatory Visit
Admission: RE | Admit: 2022-09-01 | Discharge: 2022-09-01 | Disposition: A | Discharge status: Home / Self Care | Payer: Medicaid Other | Source: Ambulatory Visit

## 2022-09-01 VITALS — BP 122/74 | HR 81 | Temp 98.3°F | Resp 18

## 2022-09-01 DIAGNOSIS — J029 Acute pharyngitis, unspecified: Secondary | ICD-10-CM | POA: Insufficient documentation

## 2022-09-01 LAB — POCT MONO SCREEN (KUC): Mono, POC: NEGATIVE

## 2022-09-01 LAB — POCT RAPID STREP A (OFFICE): Rapid Strep A Screen: NEGATIVE

## 2022-09-01 MED ORDER — LIDOCAINE VISCOUS HCL 2 % MT SOLN
15.0000 mL | Freq: Four times a day (QID) | OROMUCOSAL | 0 refills | Status: DC | PRN
Start: 2022-09-01 — End: 2023-03-06

## 2022-09-01 MED ORDER — PREDNISONE 20 MG PO TABS
20.0000 mg | ORAL_TABLET | Freq: Every day | ORAL | 0 refills | Status: AC
Start: 2022-09-01 — End: 2022-09-06

## 2022-09-01 NOTE — ED Provider Notes (Signed)
[facility]    CSN: [id number] Arrival date & time: 09/01/22  1144      History   Chief Complaint Chief Complaint  Patient presents with   Sore Throat    Entered by patient    HPI Tara Sanchez is a 32 y.o. female  presents for evaluation of URI symptoms for 5 days. Patient reports associated symptoms of sore throat and left ear pain. Denies N/V/D, fevers, cough, congestion, body aches, shortness of breath. Patient does note have a hx of asthma or smoking. No known sick contacts.  Patient was seen on 5/8 at [facility] for 2 days of sore throat.  She had a negative strep PCR, respiratory panel PCR including RSV, flu, COVID.  She was given a dexamethasone injection and diagnosed with tonsillitis.  Patient states typically the injection helps her which it did temporarily but it seems to have worsened again.  She states a few days ago she also discovered mold on her mattress and thinks it might be contributing.  She did get rid of her mattress 2 days ago but has had no change in her symptoms.  Pt has taken Mucinex, cough drops OTC for symptoms. Pt has no other concerns at this time.    Sore Throat    Past Medical History:  Diagnosis Date   Bipolar 2 disorder (HCC)    Genital herpes    Headache    History of preterm delivery, currently pregnant 04/17/2016   makena   Infection    UTI   Preterm labor     Patient Active Problem List   Diagnosis Date Noted   Vaginal delivery 03/31/2021   Normal labor 03/31/2021   Transient hypertension 03/21/2021   LGA (large for gestational age) fetus affecting management of mother 12/17/2020   Supervision of high risk pregnancy, antepartum 09/11/2020   Nexplanon in place 04/18/2019   History of preterm premature rupture of membranes (PPROM) 03/17/2019   History of herpes genitalis 02/07/2015   History of preterm delivery 01/09/2015   Maternal varicella, non-immune 01/09/2015    Past Surgical History:  Procedure  Laterality Date   INDUCED ABORTION     THERAPEUTIC ABORTION      OB History     Gravida  8   Para  5   Term  2   Preterm  3   AB  3   Living  5      SAB  0   IAB  3   Ectopic  0   Multiple  0   Live Births  5            Home Medications    Prior to Admission medications   Medication Sig Start Date End Date Taking? Authorizing Provider  etonogestrel (NEXPLANON) 68 MG IMPL implant 1 each by Subdermal route once.   Yes [provider]  lidocaine (XYLOCAINE) 2 % solution Use as directed 15 mLs in the mouth or throat every 6 (six) hours as needed (sore throat). Gargle and spit, do not swallow 09/01/22  Yes Radford Pax, NP  predniSONE (DELTASONE) 20 MG tablet Take 1 tablet (20 mg total) by mouth daily with breakfast for 5 days. 09/01/22 09/06/22 Yes Radford Pax, NP  propranolol (INDERAL) 10 MG tablet Take 10 mg by mouth 2 (two) times daily as needed. 08/31/22  Yes [provider]  traZODone (DESYREL) 50 MG tablet Take 25-50 mg by mouth at bedtime as needed. 08/31/22  Yes  [provider]  VRAYLAR 1.5 MG capsule Take 1.5 mg by mouth daily. 08/31/22  Yes [provider]  famotidine (PEPCID) 20 MG tablet Take 1 tablet (20 mg total) by mouth 2 (two) times daily. 01/27/22   Palumbo, April, MD    Family History Family History  Problem Relation Age of Onset   Healthy Mother    Healthy Father    Diabetes Paternal Grandmother    Asthma Neg Hx    Cancer Neg Hx    Heart disease Neg Hx    Hypertension Neg Hx    Stroke Neg Hx    Alcohol abuse Neg Hx    Arthritis Neg Hx    Birth defects Neg Hx    COPD Neg Hx    Depression Neg Hx    Drug abuse Neg Hx    Early death Neg Hx    Hearing loss Neg Hx    Hyperlipidemia Neg Hx    Kidney disease Neg Hx    Learning disabilities Neg Hx    Mental illness Neg Hx    Mental retardation Neg Hx    Miscarriages / Stillbirths Neg Hx    Vision loss Neg Hx    Varicose Veins Neg Hx     Social  History Social History   Tobacco Use   Smoking status: Never   Smokeless tobacco: Never  Vaping Use   Vaping Use: Never used  Substance Use Topics   Alcohol use: Not Currently    Comment: Social   Drug use: Not Currently    Types: Marijuana    Comment: Last use 08/01/2018     Allergies   Benzonatate, Macrobid [nitrofurantoin macrocrystal], Phenergan [promethazine hcl], and Promethazine   Review of Systems Review of Systems  HENT:  Positive for ear pain and sore throat.      Physical Exam Triage Vital Signs ED Triage Vitals [09/01/22 1205]  Enc Vitals Group     BP 122/74     Pulse Rate 81     Resp 18     Temp 98.3 F (36.8 C)     Temp Source Oral     SpO2 95 %     Weight      Height      Head Circumference      Peak Flow      Pain Score 10     Pain Loc      Pain Edu?      Excl. in GC?    No data found.  Updated Vital Signs BP 122/74 (BP Location: Right Arm)   Pulse 81   Temp 98.3 F (36.8 C) (Oral)   Resp 18   SpO2 95%   Visual Acuity Right Eye Distance:   Left Eye Distance:   Bilateral Distance:    Right Eye Near:   Left Eye Near:    Bilateral Near:     Physical Exam Vitals and nursing note reviewed.  Constitutional:      General: She is not in acute distress.    Appearance: Normal appearance. She is not ill-appearing.  HENT:     Head: Normocephalic and atraumatic.     Right Ear: A middle ear effusion is present. Tympanic membrane is not erythematous.     Left Ear: A middle ear effusion is present. Tympanic membrane is not erythematous.     Nose: Nose normal.     Mouth/Throat:     Mouth: Mucous membranes are moist.     Pharynx: Oropharynx  is clear. Posterior oropharyngeal erythema present. No pharyngeal swelling, oropharyngeal exudate or uvula swelling.     Tonsils: No tonsillar exudate or tonsillar abscesses. 2+ on the right. 1+ on the left.  Eyes:     Extraocular Movements: Extraocular movements intact.     Conjunctiva/sclera:  Conjunctivae normal.     Pupils: Pupils are equal, round, and reactive to light.  Cardiovascular:     Rate and Rhythm: Normal rate and regular rhythm.     Heart sounds: Normal heart sounds.  Pulmonary:     Effort: Pulmonary effort is normal.     Breath sounds: Normal breath sounds.  Neurological:     General: No focal deficit present.     Mental Status: She is alert and oriented to person, place, and time.  Psychiatric:        Mood and Affect: Mood normal.        Behavior: Behavior normal.      UC Treatments / Results  Labs (all labs ordered are listed, but only abnormal results are displayed) Labs Reviewed  CULTURE, GROUP A STREP [facility])  POCT RAPID STREP A (OFFICE)  POCT MONO SCREEN ([facility])    EKG   Radiology No results found.  Procedures Procedures (including critical care time)  Medications Ordered in UC Medications - No data to display  Initial Impression / Assessment and Plan / UC Course  I have reviewed the triage vital signs and the nursing notes.  Pertinent labs & imaging results that were available during my care of the patient were reviewed by me and considered in my medical decision making (see chart for details).     Reviewed exam and symptoms with patient.  No red flags. Negative rapid strep and mono.  Will send throat culture Discussed viral illness Lidocaine gargle as needed Prednisone daily for 5 days Continue salt water gargles and warm liquids OTC ibuprofen as needed PCP follow-up if symptoms do not improve ER precautions reviewed and patient verbalized understanding Final Clinical Impressions(s) / UC Diagnoses   Final diagnoses:  Sore throat  Acute pharyngitis, unspecified etiology     Discharge Instructions      Your strep test was negative in clinic.  The clinic will send this for culture and contact you if those are positive Start lidocaine gargle as needed.  Gargle and spit do not swallow Prednisone daily for 5 days Continue  salt water gargles and warm liquids Over-the-counter ibuprofen as needed Please follow-up with the PCP in 2 to 3 days for recheck Please go to the emergency room if you develop any worsening symptoms     ED Prescriptions     Medication Sig Dispense Auth. Provider   lidocaine (XYLOCAINE) 2 % solution Use as directed 15 mLs in the mouth or throat every 6 (six) hours as needed (sore throat). Gargle and spit, do not swallow 100 mL Radford Pax, NP   predniSONE (DELTASONE) 20 MG tablet Take 1 tablet (20 mg total) by mouth daily with breakfast for 5 days. 5 tablet Radford Pax, NP      PDMP not reviewed this encounter.   Radford Pax, NP 09/01/22 [phone]

## 2022-09-01 NOTE — Discharge Instructions (Signed)
Your strep test was negative in clinic.  The clinic will send this for culture and contact you if those are positive Start lidocaine gargle as needed.  Gargle and spit do not swallow Prednisone daily for 5 days Continue salt water gargles and warm liquids Over-the-counter ibuprofen as needed Please follow-up with the PCP in 2 to 3 days for recheck Please go to the emergency room if you develop any worsening symptoms

## 2022-09-01 NOTE — ED Triage Notes (Signed)
Pt reports sore throat and left ear after mold exposure in her mattress x 5 days.

## 2022-09-03 LAB — CULTURE, GROUP A STREP (THRC)

## 2022-09-04 LAB — CULTURE, GROUP A STREP (THRC)

## 2022-09-10 ENCOUNTER — Ambulatory Visit: Payer: BC Managed Care – PPO

## 2022-11-01 ENCOUNTER — Ambulatory Visit (INDEPENDENT_AMBULATORY_CARE_PROVIDER_SITE_OTHER): Payer: Medicaid Other

## 2022-11-01 ENCOUNTER — Ambulatory Visit
Admission: EM | Admit: 2022-11-01 | Discharge: 2022-11-01 | Disposition: A | Discharge status: Home / Self Care | Payer: Medicaid Other | Attending: Internal Medicine | Admitting: Internal Medicine

## 2022-11-01 DIAGNOSIS — M25522 Pain in left elbow: Secondary | ICD-10-CM | POA: Diagnosis not present

## 2022-11-01 DIAGNOSIS — S42402A Unspecified fracture of lower end of left humerus, initial encounter for closed fracture: Secondary | ICD-10-CM

## 2022-11-01 MED ORDER — HYDROCODONE-ACETAMINOPHEN 5-325 MG PO TABS
1.0000 | ORAL_TABLET | Freq: Four times a day (QID) | ORAL | 0 refills | Status: DC | PRN
Start: 2022-11-01 — End: 2023-03-06

## 2022-11-01 NOTE — Discharge Instructions (Signed)
Your x-ray indicates that you likely have an elbow fracture.  You are placed in a temporary fiberglass splint here in the clinic.  Please keep your arm in the splint until you are seen by orthopedics.  Please follow-up with [facility] on Monday 7/15.  You may take Norco as needed for pain.  Do not take any over-the-counter NSAIDs such as ibuprofen, Advil, Aleve, naproxen for the first 48 hours as this increases your risk of bleeding.  You may continue to elevate and ice the elbow as needed.  Please go to the emergency room for any worsening symptoms that occur prior to seeing orthopedics.  This includes but is not limited to uncontrolled pain or swelling, persistent numbness or tingling, or any new concerns that arise.  I hope you feel better soon!

## 2022-11-01 NOTE — ED Provider Notes (Addendum)
[facility]    CSN: [id number] Arrival date & time: 11/01/22  1013      History   Chief Complaint No chief complaint on file.   HPI Tara Sanchez is a 32 y.o. female presents for evaluation of elbow pain.  Patient reports last night she fell onto some wooden planks.  She is not sure how she fell but she has pain primarily in her left elbow.  Denies head injury or LOC.  States it feels swollen but denies any bruising or numbness or tingling.  Does have reduced range of motion due to the pain.  Pain radiates from her elbow into her forearm and slightly into her upper arm.  No history of injuries or surgeries to this elbow in the past.  She has not taken any OTC medications for symptoms.  No other concerns at this time.  HPI  Past Medical History:  Diagnosis Date   Bipolar 2 disorder (HCC)    Genital herpes    Headache    History of preterm delivery, currently pregnant 04/17/2016   makena   Infection    UTI   Preterm labor     Patient Active Problem List   Diagnosis Date Noted   Vaginal delivery 03/31/2021   Normal labor 03/31/2021   Transient hypertension 03/21/2021   LGA (large for gestational age) fetus affecting management of mother 12/17/2020   Supervision of high risk pregnancy, antepartum 09/11/2020   Nexplanon in place 04/18/2019   History of preterm premature rupture of membranes (PPROM) 03/17/2019   History of herpes genitalis 02/07/2015   History of preterm delivery 01/09/2015   Maternal varicella, non-immune 01/09/2015    Past Surgical History:  Procedure Laterality Date   INDUCED ABORTION     THERAPEUTIC ABORTION      OB History     Gravida  8   Para  5   Term  2   Preterm  3   AB  3   Living  5      SAB  0   IAB  3   Ectopic  0   Multiple  0   Live Births  5            Home Medications    Prior to Admission medications   Medication Sig Start Date End Date Taking? Authorizing Provider  HYDROcodone-acetaminophen  (NORCO/VICODIN) 5-325 MG tablet Take 1-2 tablets by mouth every 6 (six) hours as needed (elbow pain). 11/01/22  Yes Radford Pax, NP  etonogestrel (NEXPLANON) 68 MG IMPL implant 1 each by Subdermal route once.    [provider]  famotidine (PEPCID) 20 MG tablet Take 1 tablet (20 mg total) by mouth 2 (two) times daily. 01/27/22   Palumbo, April, MD  lidocaine (XYLOCAINE) 2 % solution Use as directed 15 mLs in the mouth or throat every 6 (six) hours as needed (sore throat). Gargle and spit, do not swallow 09/01/22   Radford Pax, NP  propranolol (INDERAL) 10 MG tablet Take 10 mg by mouth 2 (two) times daily as needed. 08/31/22   [provider]  traZODone (DESYREL) 50 MG tablet Take 25-50 mg by mouth at bedtime as needed. 08/31/22   [provider]  VRAYLAR 1.5 MG capsule Take 1.5 mg by mouth daily. 08/31/22   [provider]    Family History Family History  Problem Relation Age of Onset   Healthy Mother    Healthy Father    Diabetes Paternal Grandmother  Asthma Neg Hx    Cancer Neg Hx    Heart disease Neg Hx    Hypertension Neg Hx    Stroke Neg Hx    Alcohol abuse Neg Hx    Arthritis Neg Hx    Birth defects Neg Hx    COPD Neg Hx    Depression Neg Hx    Drug abuse Neg Hx    Early death Neg Hx    Hearing loss Neg Hx    Hyperlipidemia Neg Hx    Kidney disease Neg Hx    Learning disabilities Neg Hx    Mental illness Neg Hx    Mental retardation Neg Hx    Miscarriages / Stillbirths Neg Hx    Vision loss Neg Hx    Varicose Veins Neg Hx     Social History Social History   Tobacco Use   Smoking status: Never   Smokeless tobacco: Never  Vaping Use   Vaping status: Never Used  Substance Use Topics   Alcohol use: Not Currently    Comment: Social   Drug use: Not Currently    Types: Marijuana    Comment: Last use 08/01/2018     Allergies   Benzonatate, Macrobid [nitrofurantoin macrocrystal], Phenergan [promethazine hcl], and  Promethazine   Review of Systems Review of Systems  Musculoskeletal:        Left elbow pain      Physical Exam Triage Vital Signs ED Triage Vitals  Encounter Vitals Group     BP 11/01/22 1030 111/69     Systolic BP Percentile --      Diastolic BP Percentile --      Pulse Rate 11/01/22 1030 88     Resp 11/01/22 1030 16     Temp 11/01/22 1030 98.7 F (37.1 C)     Temp Source 11/01/22 1030 Oral     SpO2 11/01/22 1030 96 %     Weight --      Height --      Head Circumference --      Peak Flow --      Pain Score 11/01/22 1032 8     Pain Loc --      Pain Education --      Exclude from Growth Chart --    No data found.  Updated Vital Signs BP 111/69 (BP Location: Right Arm)   Pulse 88   Temp 98.7 F (37.1 C) (Oral)   Resp 16   SpO2 96%   Visual Acuity Right Eye Distance:   Left Eye Distance:   Bilateral Distance:    Right Eye Near:   Left Eye Near:    Bilateral Near:     Physical Exam Vitals and nursing note reviewed.  Constitutional:      Appearance: Normal appearance.  HENT:     Head: Normocephalic and atraumatic.  Eyes:     Pupils: Pupils are equal, round, and reactive to light.  Cardiovascular:     Rate and Rhythm: Normal rate.  Pulmonary:     Effort: Pulmonary effort is normal.  Musculoskeletal:     Left elbow: No swelling, deformity or effusion. Decreased range of motion. Tenderness present in radial head.     Comments: Reduced range of motion with flexion and extension due to pain.  No tenderness palpation to wrist or hand.  Strength is 5 out of 5 bilateral upper extremities.  Skin:    General: Skin is warm and dry.  Neurological:  General: No focal deficit present.     Mental Status: She is alert and oriented to person, place, and time.  Psychiatric:        Mood and Affect: Mood normal.        Behavior: Behavior normal.      UC Treatments / Results  Labs (all labs ordered are listed, but only abnormal results are displayed) Labs  Reviewed - No data to display  EKG   Radiology DG Elbow Complete Left  Result Date: 11/01/2022 CLINICAL DATA:  Fall, left elbow pain EXAM: LEFT ELBOW - COMPLETE 3+ VIEW COMPARISON:  None Available. FINDINGS: Large elbow joint effusion suggestive of a hemarthrosis in the setting of trauma. No identifiable fracture is seen. Specifically, no radial head fracture. Normal alignment without dislocation. Mild soft tissue swelling. Linear 4 cm echogenic structure within the soft tissues of the medial upper arm, likely implanted contraceptive device. IMPRESSION: Large elbow joint effusion/hemarthrosis. Findings are most compatible with an occult fracture in the setting of trauma. This could be better characterized by CT. Electronically Signed   By: Duanne Guess D.O.   On: 11/01/2022 11:12    Procedures Procedures (including critical care time)  Medications Ordered in UC Medications - No data to display  Initial Impression / Assessment and Plan / UC Course  I have reviewed the triage vital signs and the nursing notes.  Pertinent labs & imaging results that were available during my care of the patient were reviewed by me and considered in my medical decision making (see chart for details).     Reviewed exam and x-ray results with patient.  Large joint effusion/hemarthrosis.  Will treat for elbow fracture.  Patient was placed in a fiberglass posterior splint to keep that elbow stabilized.Sling for support.   She was instructed to keep this on until she follows up with orthopedics.  Sensation and circulation intact after placement.  Will avoid NSAIDs due to the hemarthrosis for the next 48 hours.  Rx Norco sent to pharmacy.  PDMP reviewed.  Patient to follow-up with [facility] on Monday as they are closed on Sundays.  Strict ER precautions reviewed and patient verbalized understanding. Final Clinical Impressions(s) / UC Diagnoses   Final diagnoses:  Left elbow pain  Closed fracture of left elbow,  initial encounter     Discharge Instructions      Your x-ray indicates that you likely have an elbow fracture.  You are placed in a temporary fiberglass splint here in the clinic.  Please keep your arm in the splint until you are seen by orthopedics.  Please follow-up with [facility] on Monday 7/15.  You may take Norco as needed for pain.  Do not take any over-the-counter NSAIDs such as ibuprofen, Advil, Aleve, naproxen for the first 48 hours as this increases your risk of bleeding.  You may continue to elevate and ice the elbow as needed.  Please go to the emergency room for any worsening symptoms that occur prior to seeing orthopedics.  This includes but is not limited to uncontrolled pain or swelling, persistent numbness or tingling, or any new concerns that arise.  I hope you feel better soon!     ED Prescriptions     Medication Sig Dispense Auth. Provider   HYDROcodone-acetaminophen (NORCO/VICODIN) 5-325 MG tablet Take 1-2 tablets by mouth every 6 (six) hours as needed (elbow pain). 15 tablet Radford Pax, NP      I have reviewed the PDMP during this encounter.   Cheri Rous  R, NP 11/01/22 1131    Radford Pax, NP 11/01/22 1131

## 2022-11-01 NOTE — ED Triage Notes (Signed)
Pt presents to UC w/ c/o falling onto wooden planks last night onto left forearm. Limited ROM of forearm when bending at elbow.

## 2022-12-12 ENCOUNTER — Ambulatory Visit: Payer: 59 | Admitting: Student

## 2023-03-06 ENCOUNTER — Encounter: Payer: Self-pay | Admitting: Student

## 2023-03-06 ENCOUNTER — Ambulatory Visit: Payer: 59 | Admitting: Student

## 2023-03-06 VITALS — BP 120/68 | HR 110 | Temp 98.0°F | Resp 18 | Ht 67.0 in | Wt 203.0 lb

## 2023-03-06 DIAGNOSIS — Z7689 Persons encountering health services in other specified circumstances: Secondary | ICD-10-CM | POA: Diagnosis not present

## 2023-03-06 DIAGNOSIS — F419 Anxiety disorder, unspecified: Secondary | ICD-10-CM | POA: Insufficient documentation

## 2023-03-06 DIAGNOSIS — Z6831 Body mass index (BMI) 31.0-31.9, adult: Secondary | ICD-10-CM | POA: Diagnosis not present

## 2023-03-06 DIAGNOSIS — N760 Acute vaginitis: Secondary | ICD-10-CM | POA: Insufficient documentation

## 2023-03-06 DIAGNOSIS — F3181 Bipolar II disorder: Secondary | ICD-10-CM | POA: Insufficient documentation

## 2023-03-06 DIAGNOSIS — B9689 Other specified bacterial agents as the cause of diseases classified elsewhere: Secondary | ICD-10-CM | POA: Insufficient documentation

## 2023-03-06 NOTE — Assessment & Plan Note (Signed)
She is stable on current medications and is followed by psychiatry.

## 2023-03-06 NOTE — Assessment & Plan Note (Signed)
We have had a detail discussion about this practices abilities and limitations. Each patient will be provided care that extends from acute to chronic care management. Presenting to all scheduled appointments and notifying our office of any changes to patients medical conditions is important.

## 2023-03-06 NOTE — Assessment & Plan Note (Signed)
Same as above

## 2023-03-06 NOTE — Progress Notes (Signed)
New Patient Office Visit  Subjective    Patient ID: Tara Sanchez, female    DOB: March 30, 1991  Age: 32 y.o. MRN: [id number]  CC:  Chief Complaint  Patient presents with   New Patient (Initial Visit)    New patient     HPI Tara Sanchez presents to establish care. She says that she has never had a PCP but in the past has used her OB/GYN for other concerns. She has 5 children and works for [employer]. She has plans to have plastic surgery in the beginning of the year.   The patients medical history includes   Transient hypertension: She has a history of high blood pressures during gestation. Monitors her blood pressures at home if symptoms arise. Otherwise she says her blood pressures rise with stress, worry, and overactive activities. She denies chest pain, shortness of breath, palpations or leg swelling.   Bipoloar 2/Anxiety: The patient is being treated by psychiatry Lem[facility] [place]) for bipolar 2 and anxiety disorder. Using propanolol 10 mg prn as needed, vraylar 1.5 mg daily, buspirone 10 mg daily.  She reports no longer using trazadone. Today she denies feelings of suicidal ideation, homicidal ideation, worthlessness, helplessness, hypersomnia, or illicit drug use.    Outpatient Encounter Medications as of 03/06/2023  Medication Sig   busPIRone (BUSPAR) 10 MG tablet Take 10 mg by mouth 2 (two) times daily.   valACYclovir (VALTREX) 1000 MG tablet Take 1,000 mg by mouth 2 (two) times daily.   etonogestrel (NEXPLANON) 68 MG IMPL implant 1 each by Subdermal route once.   propranolol (INDERAL) 10 MG tablet Take 10 mg by mouth 2 (two) times daily as needed.   VRAYLAR 1.5 MG capsule Take 1.5 mg by mouth daily.   [DISCONTINUED] famotidine (PEPCID) 20 MG tablet Take 1 tablet (20 mg total) by mouth 2 (two) times daily.   [DISCONTINUED] HYDROcodone-acetaminophen (NORCO/VICODIN) 5-325 MG tablet Take 1-2 tablets by mouth every 6 (six) hours as needed (elbow pain).   [DISCONTINUED]  lidocaine (XYLOCAINE) 2 % solution Use as directed 15 mLs in the mouth or throat every 6 (six) hours as needed (sore throat). Gargle and spit, do not swallow   [DISCONTINUED] traZODone (DESYREL) 50 MG tablet Take 25-50 mg by mouth at bedtime as needed.   No facility-administered encounter medications on file as of 03/06/2023.    Past Medical History:  Diagnosis Date   Bipolar 2 disorder (HCC)    Genital herpes    Headache    History of preterm delivery, currently pregnant 04/17/2016   makena   Infection    UTI   Preterm labor     Past Surgical History:  Procedure Laterality Date   INDUCED ABORTION     THERAPEUTIC ABORTION      Family History  Problem Relation Age of Onset   Healthy Mother    Healthy Father    Diabetes Paternal Grandmother    Asthma Neg Hx    Cancer Neg Hx    Heart disease Neg Hx    Hypertension Neg Hx    Stroke Neg Hx    Alcohol abuse Neg Hx    Arthritis Neg Hx    Birth defects Neg Hx    COPD Neg Hx    Depression Neg Hx    Drug abuse Neg Hx    Early death Neg Hx    Hearing loss Neg Hx    Hyperlipidemia Neg Hx    Kidney disease Neg Hx    Learning  disabilities Neg Hx    Mental illness Neg Hx    Mental retardation Neg Hx    Miscarriages / Stillbirths Neg Hx    Vision loss Neg Hx    Varicose Veins Neg Hx     Social History   Socioeconomic History   Marital status: Single    Spouse name: Not on file   Number of children: 3   Years of education: Not on file   Highest education level: Not on file  Occupational History   Not on file  Tobacco Use   Smoking status: Never   Smokeless tobacco: Never  Vaping Use   Vaping status: Never Used  Substance and Sexual Activity   Alcohol use: Not Currently    Comment: Social   Drug use: Not Currently    Comment: Last use 08/01/2018   Sexual activity: Yes    Birth control/protection: Implant  Other Topics Concern   Not on file  Social History Narrative   ** Merged History Encounter **        Social Determinants of Health   Financial Resource Strain: Not on file  Food Insecurity: No Food Insecurity (03/21/2021)   Hunger Vital Sign    Worried About Running Out of Food in the Last Year: Never true    Ran Out of Food in the Last Year: Never true  Transportation Needs: No Transportation Needs (03/21/2021)   PRAPARE - Administrator, Civil Service (Medical): No    Lack of Transportation (Non-Medical): No  Physical Activity: Not on file  Stress: Not on file  Social Connections: Not on file  Intimate Partner Violence: Not At Risk (03/17/2019)   Humiliation, Afraid, Rape, and Kick questionnaire    Fear of Current or Ex-Partner: No    Emotionally Abused: No    Physically Abused: No    Sexually Abused: No    Review of Systems  Constitutional: Negative.   HENT:  Positive for sore throat. Negative for congestion and sinus pain.   Eyes: Negative.   Respiratory: Negative.    Cardiovascular: Negative.   Gastrointestinal: Negative.   Genitourinary: Negative.   Musculoskeletal: Negative.   Skin: Negative.   Neurological: Negative.   Endo/Heme/Allergies:  Positive for environmental allergies.  Psychiatric/Behavioral: Negative.          Objective    BP 120/68 (BP Location: Left Arm, Patient Position: Sitting, Cuff Size: Normal)   Pulse (!) 110   Temp 98 F (36.7 C)   Resp 18   Ht 5\' 7"  (1.702 m)   Wt 203 lb (92.1 kg)   SpO2 98%   BMI 31.79 kg/m   Physical Exam Vitals reviewed.  Constitutional:      Appearance: Normal appearance.  Cardiovascular:     Rate and Rhythm: Normal rate and regular rhythm.     Pulses: Normal pulses.     Heart sounds: Normal heart sounds.  Pulmonary:     Effort: Pulmonary effort is normal.     Breath sounds: Normal breath sounds.  Abdominal:     General: Bowel sounds are normal.     Palpations: Abdomen is soft.  Skin:    General: Skin is warm and dry.     Capillary Refill: Capillary refill takes less than 2 seconds.   Neurological:     Mental Status: She is alert and oriented to person, place, and time.  Psychiatric:        Behavior: Behavior normal.  Assessment & Plan:   Problem List Items Addressed This Visit     Anxiety    Same as above.      Relevant Medications   busPIRone (BUSPAR) 10 MG tablet   Bipolar 2 disorder (HCC)    She is stable on current medications and is followed by psychiatry.       Encounter to establish care - Primary    We have had a detail discussion about this practices abilities and limitations. Each patient will be provided care that extends from acute to chronic care management. Presenting to all scheduled appointments and notifying our office of any changes to patients medical conditions is important.         Return in about 1 month (around 04/05/2023) for physical exam .   Edwena Blow, NP

## 2023-03-06 NOTE — Patient Instructions (Signed)
What to use for symptom management: pseudoephedrine (for decongestant) antihistamine (like Zyrtec, Claritin, or Allegra) (to dry up) Guaifenesin (to thin out and break up mucus) Dextromethorphan (if coughing) acetaminophen (for aches, pain, or fever) Coricidin HBP (use for nasal congestion if you have high blood pressure)

## 2023-03-27 ENCOUNTER — Emergency Department (HOSPITAL_BASED_OUTPATIENT_CLINIC_OR_DEPARTMENT_OTHER): Payer: 59

## 2023-03-27 ENCOUNTER — Encounter (HOSPITAL_BASED_OUTPATIENT_CLINIC_OR_DEPARTMENT_OTHER): Payer: Self-pay | Admitting: Emergency Medicine

## 2023-03-27 ENCOUNTER — Other Ambulatory Visit: Payer: Self-pay

## 2023-03-27 ENCOUNTER — Emergency Department (HOSPITAL_BASED_OUTPATIENT_CLINIC_OR_DEPARTMENT_OTHER)
Admission: EM | Admit: 2023-03-27 | Discharge: 2023-03-27 | Disposition: A | Discharge status: Home / Self Care | Payer: 59 | Attending: Emergency Medicine | Admitting: Emergency Medicine

## 2023-03-27 DIAGNOSIS — B9689 Other specified bacterial agents as the cause of diseases classified elsewhere: Secondary | ICD-10-CM | POA: Diagnosis not present

## 2023-03-27 DIAGNOSIS — N3000 Acute cystitis without hematuria: Secondary | ICD-10-CM | POA: Insufficient documentation

## 2023-03-27 DIAGNOSIS — R1031 Right lower quadrant pain: Secondary | ICD-10-CM | POA: Diagnosis not present

## 2023-03-27 DIAGNOSIS — N760 Acute vaginitis: Secondary | ICD-10-CM | POA: Insufficient documentation

## 2023-03-27 DIAGNOSIS — D72829 Elevated white blood cell count, unspecified: Secondary | ICD-10-CM | POA: Diagnosis not present

## 2023-03-27 DIAGNOSIS — R109 Unspecified abdominal pain: Secondary | ICD-10-CM

## 2023-03-27 DIAGNOSIS — R1011 Right upper quadrant pain: Secondary | ICD-10-CM | POA: Diagnosis present

## 2023-03-27 LAB — COMPREHENSIVE METABOLIC PANEL
ALT: 27 U/L (ref 0–44)
AST: 16 U/L (ref 15–41)
Albumin: 3.8 g/dL (ref 3.5–5.0)
Alkaline Phosphatase: 112 U/L (ref 38–126)
Anion gap: 7 (ref 5–15)
BUN: 12 mg/dL (ref 6–20)
CO2: 26 mmol/L (ref 22–32)
Calcium: 8.9 mg/dL (ref 8.9–10.3)
Chloride: 104 mmol/L (ref 98–111)
Creatinine, Ser: 0.6 mg/dL (ref 0.44–1.00)
GFR, Estimated: >60 mL/min (ref >60)
Glucose, Bld: 86 mg/dL (ref 70–99)
Potassium: 3.6 mmol/L (ref 3.5–5.1)
Sodium: 137 mmol/L (ref 135–145)
Total Bilirubin: 0.3 mg/dL (ref <1.2)
Total Protein: 7.3 g/dL (ref 6.5–8.1)

## 2023-03-27 LAB — WET PREP, GENITAL
Sperm: NONE SEEN
Trich, Wet Prep: NONE SEEN
WBC, Wet Prep HPF POC: >=10 — AB (ref <10)
Yeast Wet Prep HPF POC: NONE SEEN

## 2023-03-27 LAB — CBC WITH DIFFERENTIAL/PLATELET
Abs Immature Granulocytes: 0.02 10*3/uL (ref 0.00–0.07)
Basophils Absolute: 0 10*3/uL (ref 0.0–0.1)
Basophils Relative: 0 %
Eosinophils Absolute: 0.1 10*3/uL (ref 0.0–0.5)
Eosinophils Relative: 1 %
HCT: 41 % (ref 36.0–46.0)
Hemoglobin: 13.4 g/dL (ref 12.0–15.0)
Immature Granulocytes: 0 %
Lymphocytes Relative: 32 %
Lymphs Abs: 3.8 10*3/uL (ref 0.7–4.0)
MCH: 29.7 pg (ref 26.0–34.0)
MCHC: 32.7 g/dL (ref 30.0–36.0)
MCV: 90.9 fL (ref 80.0–100.0)
Monocytes Absolute: 1.1 10*3/uL — ABNORMAL HIGH (ref 0.1–1.0)
Monocytes Relative: 9 %
Neutro Abs: 7 10*3/uL (ref 1.7–7.7)
Neutrophils Relative %: 58 %
Platelets: 288 10*3/uL (ref 150–400)
RBC: 4.51 MIL/uL (ref 3.87–5.11)
RDW: 13.1 % (ref 11.5–15.5)
WBC: 12 10*3/uL — ABNORMAL HIGH (ref 4.0–10.5)
nRBC: 0 % (ref 0.0–0.2)

## 2023-03-27 LAB — URINALYSIS, ROUTINE W REFLEX MICROSCOPIC
Bilirubin Urine: NEGATIVE
Glucose, UA: NEGATIVE mg/dL
Hgb urine dipstick: NEGATIVE
Ketones, ur: NEGATIVE mg/dL
Nitrite: NEGATIVE
Protein, ur: NEGATIVE mg/dL
Specific Gravity, Urine: 1.025 (ref 1.005–1.030)
pH: 7 (ref 5.0–8.0)

## 2023-03-27 LAB — URINALYSIS, MICROSCOPIC (REFLEX): RBC / HPF: NONE SEEN RBC/hpf (ref 0–5)

## 2023-03-27 LAB — PREGNANCY, URINE: Preg Test, Ur: NEGATIVE

## 2023-03-27 LAB — LIPASE, BLOOD: Lipase: 41 U/L (ref 11–51)

## 2023-03-27 MED ORDER — IOHEXOL 300 MG/ML  SOLN
100.0000 mL | Freq: Once | INTRAMUSCULAR | Status: AC | PRN
  Administered 2023-03-27: 100 mL via INTRAVENOUS

## 2023-03-27 MED ORDER — METRONIDAZOLE 500 MG PO TABS: 500.0000 mg | ORAL_TABLET | Freq: Two times a day (BID) | ORAL | 0 refills | Status: DC

## 2023-03-27 MED ORDER — CEPHALEXIN 500 MG PO CAPS: 500.0000 mg | ORAL_CAPSULE | Freq: Three times a day (TID) | ORAL | 0 refills | Status: DC

## 2023-03-27 NOTE — ED Notes (Signed)
Discharge instructions regarding home care of UTI were reviewed. Pt was instructed to take antibiotics as directed, to follow up with PCP, and to return with any new or worsening symptoms. IV removed, catheter intact, and bleeding controlled

## 2023-03-27 NOTE — Discharge Instructions (Signed)
It was a pleasure taking care of you here in the emergency department today.  We have written you for medications for urinary tract infection as well as Flagyl.  Take as prescribed.  Make sure to follow-up outpatient, return for new or worsening symptoms

## 2023-03-27 NOTE — ED Provider Notes (Signed)
[place] [facility] Provider Note   CSN: [id number] Arrival date & time: 03/27/23  8413    History  Chief Complaint  Patient presents with   Abdominal Pain    Tara Sanchez is a 32 y.o. female here for evaluation of lower abd pain.  Started few days ago.  Associated nausea that vomiting.  She feels like she is bloated.  Has tried Tylenol, ibuprofen, Pepto-Bismol.  No fever, chest pain, shortness of breath.  Some urinary frequency without dysuria.  No hematuria.  She has not chronic vaginal discharge.  She also admits to chronic dyspareunia.  No new rashes or lesions.  No change in bowel movements.  No prior abdominal surgeries  HPI     Home Medications Prior to Admission medications   Medication Sig Start Date End Date Taking? Authorizing Provider  cephALEXin (KEFLEX) 500 MG capsule Take 1 capsule (500 mg total) by mouth 3 (three) times daily. 03/27/23  Yes Chaneka Trefz A, PA-C  metroNIDAZOLE (FLAGYL) 500 MG tablet Take 1 tablet (500 mg total) by mouth 2 (two) times daily. 03/27/23  Yes Shavontae Gibeault A, PA-C  busPIRone (BUSPAR) 10 MG tablet Take 10 mg by mouth 2 (two) times daily. 10/30/22   [provider]  etonogestrel (NEXPLANON) 68 MG IMPL implant 1 each by Subdermal route once.    [provider]  propranolol (INDERAL) 10 MG tablet Take 10 mg by mouth 2 (two) times daily as needed. 08/31/22   [provider]  valACYclovir (VALTREX) 1000 MG tablet Take 1,000 mg by mouth 2 (two) times daily. 12/15/22   [provider]  VRAYLAR 1.5 MG capsule Take 1.5 mg by mouth daily. 08/31/22   [provider]      Allergies    Benzonatate, Macrobid [nitrofurantoin macrocrystal], Phenergan [promethazine hcl], and Promethazine    Review of Systems   Review of Systems  Constitutional: Negative.   HENT: Negative.    Respiratory: Negative.    Cardiovascular: Negative.   Gastrointestinal:  Positive for  abdominal pain and nausea. Negative for abdominal distention, anal bleeding, blood in stool, constipation, diarrhea, rectal pain and vomiting.  Genitourinary: Negative.   Musculoskeletal: Negative.   Skin: Negative.   Neurological: Negative.   All other systems reviewed and are negative.  Physical Exam Updated Vital Signs BP 102/68   Pulse 84   Temp 98.6 F (37 C) (Oral)   Resp 16   Ht 5\' 7"  (1.702 m)   Wt 92.5 kg   SpO2 100%   BMI 31.95 kg/m  Physical Exam Vitals and nursing note reviewed.  Constitutional:      General: She is not in acute distress.    Appearance: She is well-developed. She is not ill-appearing.  HENT:     Head: Atraumatic.  Eyes:     Pupils: Pupils are equal, round, and reactive to light.  Cardiovascular:     Rate and Rhythm: Normal rate.  Pulmonary:     Effort: No respiratory distress.  Abdominal:     General: Bowel sounds are normal. There is no distension.     Palpations: Abdomen is soft.     Tenderness: There is abdominal tenderness in the right upper quadrant, right lower quadrant, periumbilical area and suprapubic area. There is no right CVA tenderness, left CVA tenderness, guarding or rebound. Negative signs include Murphy's sign.  Genitourinary:    Comments: Normal appearing external female genitalia without rashes or lesions, normal vaginal epithelium . Normal appearing  cervix with white discharge. No cervical petechiae. Cervical os is closed. There is no bleeding noted at the os.no Odor. Bimanual: No CMT,  nontender.  No palpable adnexal masses or tenderness. Uterus midline and not fixed. Rectovaginal exam was deferred.  No cystocele or rectocele noted. No pelvic lymphadenopathy noted. Wet prep was obtained.  Cultures for gonorrhea and chlamydia collected. Exam performed with chaperone in room.   Musculoskeletal:        General: Normal range of motion.     Cervical back: Normal range of motion.  Skin:    General: Skin is warm and dry.   Neurological:     General: No focal deficit present.     Mental Status: She is alert.  Psychiatric:        Mood and Affect: Mood normal.     ED Results / Procedures / Treatments   Labs (all labs ordered are listed, but only abnormal results are displayed) Labs Reviewed  WET PREP, GENITAL - Abnormal; Notable for the following components:      Result Value   Clue Cells Wet Prep HPF POC PRESENT (*)    WBC, Wet Prep HPF POC >=10 (*)    All other components within normal limits  URINALYSIS, ROUTINE W REFLEX MICROSCOPIC - Abnormal; Notable for the following components:   Leukocytes,Ua TRACE (*)    All other components within normal limits  CBC WITH DIFFERENTIAL/PLATELET - Abnormal; Notable for the following components:   WBC 12.0 (*)    Monocytes Absolute 1.1 (*)    All other components within normal limits  URINALYSIS, MICROSCOPIC (REFLEX) - Abnormal; Notable for the following components:   Bacteria, UA MANY (*)    All other components within normal limits  LIPASE, BLOOD  COMPREHENSIVE METABOLIC PANEL  PREGNANCY, URINE  RPR  GC/CHLAMYDIA PROBE AMP ([place]) NOT AT [facility]    EKG None  Radiology CT ABDOMEN PELVIS W CONTRAST  Result Date: 03/27/2023 CLINICAL DATA:  Right lower quadrant pain for several days, initial encounter EXAM: CT ABDOMEN AND PELVIS WITH CONTRAST TECHNIQUE: Multidetector CT imaging of the abdomen and pelvis was performed using the standard protocol following bolus administration of intravenous contrast. RADIATION DOSE REDUCTION: This exam was performed according to the departmental dose-optimization program which includes automated exposure control, adjustment of the mA and/or kV according to patient size and/or use of iterative reconstruction technique. CONTRAST:  OMNIPAQUE IOHEXOL 300 MG/ML  SOLN COMPARISON:  Ultrasound of the pelvis from earlier in the same day. FINDINGS: Lower chest: No acute abnormality. Hepatobiliary: No focal liver abnormality is  seen. No gallstones, gallbladder wall thickening, or biliary dilatation. Pancreas: Unremarkable. No pancreatic ductal dilatation or surrounding inflammatory changes. Spleen: Normal in size without focal abnormality. Adrenals/Urinary Tract: Adrenal glands are within normal limits. Kidneys demonstrate a normal enhancement pattern bilaterally. Parapelvic cyst is noted on the right. No calculi or obstructive changes are seen. The bladder is decompressed. Stomach/Bowel: The appendix is within normal limits. No inflammatory changes are seen. No obstructive or inflammatory changes of the colon are noted. Stomach and small bowel are within normal limits. Vascular/Lymphatic: No significant vascular findings are present. No enlarged abdominal or pelvic lymph nodes. Reproductive: Uterus and bilateral adnexa are unremarkable. Other: No abdominal wall hernia or abnormality. No abdominopelvic ascites. Musculoskeletal: No acute or significant osseous findings. IMPRESSION: No acute abnormality correspond with the given clinical history. Electronically Signed   By: Alcide Clever M.D.   On: 03/27/2023 20:11   US PELVIC COMPLETE W TRANSVAGINAL  AND TORSION R/O  Result Date: 03/27/2023 CLINICAL DATA:  Quadrant pain for several days, initial encounter EXAM: TRANSABDOMINAL AND TRANSVAGINAL ULTRASOUND OF PELVIS DOPPLER ULTRASOUND OF OVARIES TECHNIQUE: Both transabdominal and transvaginal ultrasound examinations of the pelvis were performed. Transabdominal technique was performed for global imaging of the pelvis including uterus, ovaries, adnexal regions, and pelvic cul-de-sac. It was necessary to proceed with endovaginal exam following the transabdominal exam to visualize the ovaries. Color and duplex Doppler ultrasound was utilized to evaluate blood flow to the ovaries. COMPARISON:  None Available. FINDINGS: Uterus Measurements: 7.8 x 3.9 x 4.7 cm. = volume: 74 mL. No fibroids or other mass visualized. Endometrium Thickness: 1.8 mm.   No focal abnormality visualized. Right ovary Measurements: 3.3 x 2.0 x 2.3 cm. = volume: 7.6 mL. Normal appearance/no adnexal mass. Left ovary Measurements: 3.5 x 2.7 x 2.3 cm. = volume: 11.2 mL. Normal appearance/no adnexal mass. Pulsed Doppler evaluation of both ovaries demonstrates normal low-resistance arterial and venous waveforms. Other findings No abnormal free fluid. IMPRESSION: Unremarkable ultrasound of the pelvis. Electronically Signed   By: Alcide Clever M.D.   On: 03/27/2023 20:05    Procedures Procedures    Medications Ordered in ED Medications  iohexol (OMNIPAQUE) 300 MG/ML solution 100 mL (100 mLs Intravenous Contrast Given 03/27/23 1953)    ED Course/ Medical Decision Making/ A&P   32 year old here for evaluation of lower abdominal pain associated nausea that started few days ago.  She feels bloated.  No change in bowel movements.  No bloody stool.  Some urinary frequency without dysuria or hematuria.  She has chronic vaginal discharge as well as chronic dyspareunia.  No chest pain, shortness of breath, flank pain.  No fever.  Will plan on labs, imaging and reassess  Labs and imaging personally viewed and interpreted:  CBC leukocytosis 12.0 Pregnancy test negative Lipase 41 Metabolic panel without significant abnormality UA trace leuks, many bacteria, 11-20 WBC Wet prep with BV, greater than 10 WBC  Patient reassessed.  We discussed labs and imaging.  Given her frequency will treat for UTI.  GC culture sent.  Also being treated with Flagyl for BV she has no cervical motion or adnexal tenderness to suggest PID, TOA  Ultrasound without acute abnormality CT abdomen pelvis without acute abnormality  DC home symptomatic management.  She will follow-up outpatient, return for new or worsening symptoms  Patient is nontoxic, nonseptic appearing, in no apparent distress.  Patient's pain and other symptoms adequately managed in emergency department.  Fluid bolus given.  Labs,  imaging and vitals reviewed.  Patient does not meet the SIRS or Sepsis criteria.  On repeat exam patient does not have a surgical abdomin and there are no peritoneal signs.  No indication of appendicitis, bowel obstruction, bowel perforation, cholecystitis, diverticulitis, PID, intermittent/persistent torsion, TOA or ectopic pregnancy.  Patient discharged home with symptomatic treatment and given strict instructions for follow-up with their primary care physician.  I have also discussed reasons to return immediately to the ER.  Patient expresses understanding and agrees with plan.                                  Medical Decision Making Amount and/or Complexity of Data Reviewed External Data Reviewed: labs, radiology and notes. Labs: ordered. Decision-making details documented in ED Course. Radiology: ordered and independent interpretation performed. Decision-making details documented in ED Course.  Risk OTC drugs. Prescription drug management. Parenteral controlled substances.  Decision regarding hospitalization. Diagnosis or treatment significantly limited by social determinants of health.          Final Clinical Impression(s) / ED Diagnoses Final diagnoses:  BV (bacterial vaginosis)  Acute cystitis without hematuria  Abdominal pain, unspecified abdominal location    Rx / DC Orders ED Discharge Orders          Ordered    metroNIDAZOLE (FLAGYL) 500 MG tablet  2 times daily        03/27/23 2112    cephALEXin (KEFLEX) 500 MG capsule  3 times daily        03/27/23 2112              Yoni Lobos A, PA-C 03/27/23 2120    Virgina Norfolk, DO 03/27/23 2217

## 2023-03-27 NOTE — ED Notes (Signed)
Initial contact made. A&Ox4. Pt is resting in bed, no distress noted. Pt endorses  8/10 right flank pain that intermittently wraps around to the abdomen. Pt denies N/V but endorses having had bouts of diarrhea as well as an increase in frequency of urination without pain.

## 2023-03-27 NOTE — ED Triage Notes (Signed)
Lower abdominal pain, nausea, and bloating ongoing for a few days. No relief with tylenol, ibuprofen, pepto. No vomiting. No vaginal sx. No urinary sx.

## 2023-03-27 NOTE — ED Notes (Signed)
Pt back from ultrasound.

## 2023-03-28 LAB — RPR: RPR Ser Ql: NONREACTIVE

## 2023-03-30 LAB — GC/CHLAMYDIA PROBE AMP (~~LOC~~) NOT AT ARMC
Chlamydia: NEGATIVE
Comment: NEGATIVE
Comment: NORMAL
Neisseria Gonorrhea: NEGATIVE

## 2023-04-03 ENCOUNTER — Encounter: Payer: 59 | Admitting: Student

## 2023-04-06 DIAGNOSIS — Z3046 Encounter for surveillance of implantable subdermal contraceptive: Secondary | ICD-10-CM | POA: Diagnosis not present

## 2023-06-02 ENCOUNTER — Other Ambulatory Visit: Payer: Self-pay

## 2023-07-15 ENCOUNTER — Inpatient Hospital Stay (HOSPITAL_COMMUNITY)
Admission: AD | Admit: 2023-07-15 | Discharge: 2023-07-15 | Disposition: A | Discharge status: Home / Self Care | Attending: Obstetrics & Gynecology | Admitting: Obstetrics & Gynecology

## 2023-07-15 DIAGNOSIS — R1030 Lower abdominal pain, unspecified: Secondary | ICD-10-CM | POA: Insufficient documentation

## 2023-07-15 DIAGNOSIS — N939 Abnormal uterine and vaginal bleeding, unspecified: Secondary | ICD-10-CM | POA: Insufficient documentation

## 2023-07-15 DIAGNOSIS — Z3202 Encounter for pregnancy test, result negative: Secondary | ICD-10-CM | POA: Diagnosis not present

## 2023-07-15 LAB — POCT PREGNANCY, URINE: Preg Test, Ur: NEGATIVE

## 2023-07-15 LAB — HCG, QUANTITATIVE, PREGNANCY: hCG, Beta Chain, Quant, S: <1 m[IU]/mL (ref <5)

## 2023-07-15 NOTE — MAU Provider Note (Signed)
 Chief Complaint:  Abdominal Pain and Back Pain   HPI   None     Tara Sanchez is a 33 y.o. [phone] at Unknown who presents to maternity admissions reporting abdominal pain and positive at home pregnancy test.   Pt reports that on March 21 she took three pregnancy test and they were all negative. Her LMP was in  January. She reports that she has been having unprotected sex and is not on birth control. However, she had lipo360 surgery in January and her surgeons told her that she would have a delay in her period coming back. She is not having any other symptoms.  Pregnancy Course:  N/A  Past Medical History:  Diagnosis Date   Bipolar 2 disorder (HCC)    Genital herpes    Headache    History of preterm delivery, currently pregnant 04/17/2016   makena   Infection    UTI   Preterm labor    OB History  Gravida Para Term Preterm AB Living  8 5 2 3 3 5   SAB IAB Ectopic Multiple Live Births  0 3 0 0 5    # Outcome Date GA Lbr Len/2nd Weight Sex Type Anes PTL Lv  8 Term 03/31/21 [redacted]w[redacted]d 22:45 / 00:07 2900 g F Vag-Spont None  LIV  7 Preterm 03/17/19 [redacted]w[redacted]d 03:51 / 00:10 2415 g M Vag-Spont EPI  LIV  6 IAB 2020          5 Term 10/18/16 [redacted]w[redacted]d 02:45 / 00:06 2449 g F Vag-Spont None  LIV  4 Preterm 04/26/15 [redacted]w[redacted]d 07:19 / 00:14 2460 g M Vag-Spont EPI  LIV  3 Preterm 04/20/10 [redacted]w[redacted]d   F Vag-Spont   LIV  2 IAB 2010          1 IAB 2009           Past Surgical History:  Procedure Laterality Date   INDUCED ABORTION     THERAPEUTIC ABORTION     Family History  Problem Relation Age of Onset   Healthy Mother    Healthy Father    Diabetes Paternal Grandmother    Asthma Neg Hx    Cancer Neg Hx    Heart disease Neg Hx    Hypertension Neg Hx    Stroke Neg Hx    Alcohol abuse Neg Hx    Arthritis Neg Hx    Birth defects Neg Hx    COPD Neg Hx    Depression Neg Hx    Drug abuse Neg Hx    Early death Neg Hx    Hearing loss Neg Hx    Hyperlipidemia Neg Hx    Kidney disease Neg Hx     Learning disabilities Neg Hx    Mental illness Neg Hx    Mental retardation Neg Hx    Miscarriages / Stillbirths Neg Hx    Vision loss Neg Hx    Varicose Veins Neg Hx    Social History   Tobacco Use   Smoking status: Never   Smokeless tobacco: Never  Vaping Use   Vaping status: Never Used  Substance Use Topics   Alcohol use: Not Currently    Comment: Social   Drug use: Not Currently    Comment: Last use 08/01/2018   Allergies  Allergen Reactions   Benzonatate Anaphylaxis and Other (See Comments)   Macrobid [Nitrofurantoin Macrocrystal] Anaphylaxis   Phenergan [Promethazine Hcl] Anaphylaxis   Promethazine Other (See Comments)   No medications prior to admission.  I have reviewed patient's Past Medical Hx, Surgical Hx, Family Hx, Social Hx, medications and allergies.   ROS  Review of Systems  Constitutional:  Negative for chills and fever.  Respiratory:  Negative for shortness of breath.   Cardiovascular:  Negative for chest pain.  Gastrointestinal:  Negative for abdominal pain, nausea and vomiting.  Genitourinary:  Negative for dysuria, frequency and urgency.  Musculoskeletal:  Positive for back pain.     PHYSICAL EXAM  Patient Vitals for the past 24 hrs:  BP Temp Pulse Resp Height Weight  07/15/23 1630 119/72 98.6 F (37 C) 80 18 5\' 7"  (1.702 m) 88 kg    Constitutional: Well-developed, well-nourished female in no acute distress.  Cardiovascular: normal rate & rhythm, warm and well-perfused Respiratory: normal effort, no problems with respiration noted GI: Abd soft, non-tender, non-distended MS: Extremities nontender, no edema, normal ROM Neurologic: Alert and oriented x 4.  GU: no CVA tenderness Pelvic: NEFG, physiologic discharge, no blood, cervix clean.        Labs: Results for orders placed or performed during the hospital encounter of 07/15/23 (from the past 24 hours)  Pregnancy, urine POC     Status: None   Collection Time: 07/15/23  4:06 PM   Result Value Ref Range   Preg Test, Ur NEGATIVE NEGATIVE    Imaging:  No results found.  MDM & MAU COURSE  MDM:  Urine pregnancy test was done while in triage and was negative. Pt reports that she had the same thing happen when she was pregnant with her last daughter. She went to urgent care and had both a negative urine and blood test.  Blood test was ordered to confirm negative pregnancy   MAU Course: Orders Placed This Encounter  Procedures   hCG, quantitative, pregnancy   Pregnancy, urine POC   Discharge patient Discharge disposition: 01-Home or Self Care; Discharge patient date: 07/15/2023   No orders of the defined types were placed in this encounter.   ASSESSMENT   1. Abnormal uterine bleeding   2. Lower abdominal pain     PLAN  Discharge home in stable condition with return precautions.   Pt wanted to go home and wait for test results. She will be called with test results. If positive she will have follow up to confirm location since she is having lower abdominal pain. She was educated on when to return with worsening symptoms.    Allergies as of 07/15/2023       Reactions   Benzonatate Anaphylaxis, Other (See Comments)   Macrobid [nitrofurantoin Macrocrystal] Anaphylaxis   Phenergan [promethazine Hcl] Anaphylaxis   Promethazine Other (See Comments)        Medication List     STOP taking these medications    cephALEXin 500 MG capsule Commonly known as: KEFLEX   metroNIDAZOLE 500 MG tablet Commonly known as: FLAGYL   Nexplanon 68 MG Impl implant Generic drug: etonogestrel       TAKE these medications    busPIRone 10 MG tablet Commonly known as: BUSPAR Take 10 mg by mouth 2 (two) times daily.   propranolol 10 MG tablet Commonly known as: INDERAL Take 10 mg by mouth 2 (two) times daily as needed.   valACYclovir 1000 MG tablet Commonly known as: VALTREX Take 1,000 mg by mouth 2 (two) times daily.   Vraylar 1.5 MG capsule Generic drug:  cariprazine Take 1.5 mg by mouth daily.        Barkley Bruns, PA-S

## 2023-07-15 NOTE — Discharge Instructions (Signed)
 Tara Sanchez,  You were seen tonight for abnormal uterine bleeding, in this care you have missed a cycle but our first urine pregnancy test here was negative. You preferred to receive a phone call with your blood test results for pregnancy. We will call you when we receive those results and can provide further recommendations.  Thank you for trusting [name] to care for you, Huntley Dec, Midwife

## 2023-07-15 NOTE — MAU Note (Signed)
.  Tara Sanchez is a 33 y.o. at Unknown here in MAU reporting: abd pain and back pain x 1 week. Took home UPT and it was positive(last week) Denies any vag bleeding and reports a white vag discharge.   LMP: 04/24/23(aprox) Onset of complaint: 1 week Pain score: 7 Vitals:   07/15/23 1630  BP: 119/72  Pulse: 80  Resp: 18  Temp: 98.6 F (37 C)     FHT: n/a  Lab orders placed from triage: UPT

## 2023-07-22 DIAGNOSIS — Z30011 Encounter for initial prescription of contraceptive pills: Secondary | ICD-10-CM | POA: Diagnosis not present

## 2023-07-22 DIAGNOSIS — Z3202 Encounter for pregnancy test, result negative: Secondary | ICD-10-CM | POA: Diagnosis not present

## 2023-10-16 DIAGNOSIS — Z3046 Encounter for surveillance of implantable subdermal contraceptive: Secondary | ICD-10-CM | POA: Diagnosis not present

## 2023-10-16 DIAGNOSIS — Z3202 Encounter for pregnancy test, result negative: Secondary | ICD-10-CM | POA: Diagnosis not present

## 2024-01-06 ENCOUNTER — Encounter (HOSPITAL_BASED_OUTPATIENT_CLINIC_OR_DEPARTMENT_OTHER): Payer: Self-pay | Admitting: Emergency Medicine

## 2024-01-06 ENCOUNTER — Other Ambulatory Visit: Payer: Self-pay

## 2024-01-06 ENCOUNTER — Emergency Department (HOSPITAL_BASED_OUTPATIENT_CLINIC_OR_DEPARTMENT_OTHER)
Admission: EM | Admit: 2024-01-06 | Discharge: 2024-01-06 | Disposition: A | Discharge status: Home / Self Care | Attending: Emergency Medicine | Admitting: Emergency Medicine

## 2024-01-06 DIAGNOSIS — J069 Acute upper respiratory infection, unspecified: Secondary | ICD-10-CM | POA: Diagnosis not present

## 2024-01-06 DIAGNOSIS — R059 Cough, unspecified: Secondary | ICD-10-CM | POA: Insufficient documentation

## 2024-01-06 DIAGNOSIS — J029 Acute pharyngitis, unspecified: Secondary | ICD-10-CM | POA: Diagnosis not present

## 2024-01-06 LAB — RESP PANEL BY RT-PCR (RSV, FLU A&B, COVID)  RVPGX2
Influenza A by PCR: NEGATIVE
Influenza B by PCR: NEGATIVE
Resp Syncytial Virus by PCR: NEGATIVE
SARS Coronavirus 2 by RT PCR: NEGATIVE

## 2024-01-06 LAB — GROUP A STREP BY PCR: Group A Strep by PCR: NOT DETECTED

## 2024-01-06 NOTE — Discharge Instructions (Signed)
Drink plenty of fluids and get plenty of rest.  Take over-the-counter medications as needed for relief of symptoms. 

## 2024-01-06 NOTE — ED Triage Notes (Signed)
 Sore throat X 1 day,

## 2024-01-06 NOTE — ED Provider Notes (Signed)
 [place] [facility] Provider Note   CSN: [id number] Arrival date & time: 01/06/24  [id number]     Patient presents with: Sore Throat   Tara Sanchez is a 33 y.o. female.   Patient is a 33 year old female presenting to the ER accompanied by her 21-year-old daughter for evaluation of sore throat and cough.  The daughter has been ill with similar symptoms and patient reports drinking after her prior to becoming sick.  She denies any fevers or chills.  No chest pain or difficulty breathing.       Prior to Admission medications   Medication Sig Start Date End Date Taking? Authorizing Provider  busPIRone (BUSPAR) 10 MG tablet Take 10 mg by mouth 2 (two) times daily. 10/30/22   [provider]  propranolol (INDERAL) 10 MG tablet Take 10 mg by mouth 2 (two) times daily as needed. 08/31/22   [provider]  valACYclovir  (VALTREX ) 1000 MG tablet Take 1,000 mg by mouth 2 (two) times daily. 12/15/22   [provider]  VRAYLAR 1.5 MG capsule Take 1.5 mg by mouth daily. 08/31/22   [provider]    Allergies: Benzonatate , Macrobid  [nitrofurantoin  macrocrystal], Phenergan  [promethazine  hcl], and Promethazine     Review of Systems  All other systems reviewed and are negative.   Updated Vital Signs BP 119/64 (BP Location: Right Arm)   Pulse 88   Temp 97.9 F (36.6 C)   Resp 20   Ht 5' 7 (1.702 m)   Wt 87.5 kg   SpO2 99%   BMI 30.23 kg/m   Physical Exam Vitals and nursing note reviewed.  Constitutional:      General: She is not in acute distress.    Appearance: She is well-developed. She is not diaphoretic.  HENT:     Head: Normocephalic and atraumatic.     Right Ear: Tympanic membrane normal.     Left Ear: Tympanic membrane normal.     Nose: No congestion or rhinorrhea.     Mouth/Throat:     Tonsils: No tonsillar exudate or tonsillar abscesses.  Cardiovascular:     Rate and Rhythm: Normal rate and regular  rhythm.     Heart sounds: No murmur heard.    No friction rub. No gallop.  Pulmonary:     Effort: Pulmonary effort is normal. No respiratory distress.     Breath sounds: Normal breath sounds. No wheezing.  Abdominal:     General: Bowel sounds are normal. There is no distension.     Palpations: Abdomen is soft.     Tenderness: There is no abdominal tenderness.  Musculoskeletal:        General: Normal range of motion.     Cervical back: Normal range of motion and neck supple.  Skin:    General: Skin is warm and dry.  Neurological:     General: No focal deficit present.     Mental Status: She is alert and oriented to person, place, and time.     (all labs ordered are listed, but only abnormal results are displayed) Labs Reviewed  RESP PANEL BY RT-PCR (RSV, FLU A&B, COVID)  [id number]  GROUP A STREP BY PCR    EKG: None  Radiology: No results found.   Procedures   Medications Ordered in the ED - No data to display  Medical Decision Making  Patient presenting with URI symptoms.  She is here with her 72-year-old daughter who was ill in a similar fashion.  Patient's COVID/flu/RSV/strep all negative.  Symptoms likely viral in nature.  Will recommend over-the-counter medications, plenty of fluids, rest, and follow-up as needed.     Final diagnoses:  None    ED Discharge Orders     None          Geroldine Berg, MD 01/06/24 [phone]
